# Patient Record
Sex: Female | Born: 1960
Health system: Southern US, Community
[De-identification: ages and names within clinical notes are randomized; demographics above are authoritative.]

## PROBLEM LIST (undated history)

## (undated) DIAGNOSIS — E119 Type 2 diabetes mellitus without complications: Secondary | ICD-10-CM

## (undated) DIAGNOSIS — I213 ST elevation (STEMI) myocardial infarction of unspecified site: Secondary | ICD-10-CM

## (undated) DIAGNOSIS — R112 Nausea with vomiting, unspecified: Secondary | ICD-10-CM

## (undated) DIAGNOSIS — I5189 Other ill-defined heart diseases: Secondary | ICD-10-CM

## (undated) DIAGNOSIS — F419 Anxiety disorder, unspecified: Secondary | ICD-10-CM

## (undated) DIAGNOSIS — I2510 Atherosclerotic heart disease of native coronary artery without angina pectoris: Secondary | ICD-10-CM

## (undated) DIAGNOSIS — Z9889 Other specified postprocedural states: Secondary | ICD-10-CM

## (undated) DIAGNOSIS — F329 Major depressive disorder, single episode, unspecified: Secondary | ICD-10-CM

## (undated) DIAGNOSIS — E039 Hypothyroidism, unspecified: Secondary | ICD-10-CM

## (undated) DIAGNOSIS — E538 Deficiency of other specified B group vitamins: Secondary | ICD-10-CM

## (undated) DIAGNOSIS — Z87442 Personal history of urinary calculi: Secondary | ICD-10-CM

## (undated) DIAGNOSIS — G709 Myoneural disorder, unspecified: Secondary | ICD-10-CM

## (undated) DIAGNOSIS — T8859XA Other complications of anesthesia, initial encounter: Secondary | ICD-10-CM

## (undated) DIAGNOSIS — N200 Calculus of kidney: Secondary | ICD-10-CM

## (undated) DIAGNOSIS — F32A Depression, unspecified: Secondary | ICD-10-CM

## (undated) DIAGNOSIS — I341 Nonrheumatic mitral (valve) prolapse: Secondary | ICD-10-CM

## (undated) DIAGNOSIS — E785 Hyperlipidemia, unspecified: Secondary | ICD-10-CM

## (undated) DIAGNOSIS — Z803 Family history of malignant neoplasm of breast: Secondary | ICD-10-CM

## (undated) HISTORY — DX: Atherosclerotic heart disease of native coronary artery without angina pectoris: I25.10

## (undated) HISTORY — DX: Deficiency of other specified B group vitamins: E53.8

## (undated) HISTORY — DX: Type 2 diabetes mellitus without complications: E11.9

## (undated) HISTORY — PX: CORONARY STENT INTERVENTION: CATH118234

## (undated) HISTORY — DX: Family history of malignant neoplasm of breast: Z80.3

## (undated) HISTORY — DX: Calculus of kidney: N20.0

## (undated) HISTORY — DX: Nonrheumatic mitral (valve) prolapse: I34.1

## (undated) HISTORY — PX: MOUTH SURGERY: SHX715

---

## 1988-11-06 HISTORY — PX: BREAST REDUCTION SURGERY: SHX8

## 1999-08-09 ENCOUNTER — Ambulatory Visit (HOSPITAL_BASED_OUTPATIENT_CLINIC_OR_DEPARTMENT_OTHER): Admission: RE | Admit: 1999-08-09 | Discharge: 1999-08-09 | Payer: Self-pay | Admitting: Orthopedic Surgery

## 1999-09-27 ENCOUNTER — Ambulatory Visit (HOSPITAL_BASED_OUTPATIENT_CLINIC_OR_DEPARTMENT_OTHER): Admission: RE | Admit: 1999-09-27 | Discharge: 1999-09-27 | Payer: Self-pay | Admitting: Orthopedic Surgery

## 2001-11-06 HISTORY — PX: ABDOMINAL HYSTERECTOMY: SHX81

## 2006-06-20 ENCOUNTER — Ambulatory Visit: Payer: Self-pay | Admitting: Urology

## 2008-11-18 ENCOUNTER — Encounter: Payer: Self-pay | Admitting: Family Medicine

## 2009-10-19 LAB — CONVERTED CEMR LAB: Pap Smear: NORMAL

## 2009-11-03 ENCOUNTER — Encounter: Payer: Self-pay | Admitting: Family Medicine

## 2009-11-03 ENCOUNTER — Ambulatory Visit: Payer: Self-pay | Admitting: Family Medicine

## 2009-11-03 DIAGNOSIS — F329 Major depressive disorder, single episode, unspecified: Secondary | ICD-10-CM | POA: Insufficient documentation

## 2009-11-03 DIAGNOSIS — E559 Vitamin D deficiency, unspecified: Secondary | ICD-10-CM | POA: Insufficient documentation

## 2009-11-03 DIAGNOSIS — F411 Generalized anxiety disorder: Secondary | ICD-10-CM | POA: Insufficient documentation

## 2009-11-03 DIAGNOSIS — E785 Hyperlipidemia, unspecified: Secondary | ICD-10-CM | POA: Insufficient documentation

## 2009-11-03 DIAGNOSIS — F32A Depression, unspecified: Secondary | ICD-10-CM | POA: Insufficient documentation

## 2009-11-03 LAB — CONVERTED CEMR LAB: Vit D, 25-Hydroxy: 42 ng/mL (ref 30–89)

## 2009-11-04 LAB — CONVERTED CEMR LAB
ALT: 24 units/L (ref 0–35)
AST: 20 units/L (ref 0–37)
Albumin: 4 g/dL (ref 3.5–5.2)
Alkaline Phosphatase: 55 units/L (ref 39–117)
BUN: 13 mg/dL (ref 6–23)
Bilirubin, Direct: 0 mg/dL (ref 0.0–0.3)
CO2: 30 meq/L (ref 19–32)
Calcium: 9.4 mg/dL (ref 8.4–10.5)
Chloride: 100 meq/L (ref 96–112)
Cholesterol: 205 mg/dL — ABNORMAL HIGH (ref 0–200)
Creatinine, Ser: 0.7 mg/dL (ref 0.4–1.2)
Direct LDL: 136.8 mg/dL
GFR calc non Af Amer: 94.17 mL/min (ref >60)
Glucose, Bld: 87 mg/dL (ref 70–99)
HDL: 56.5 mg/dL (ref >39.00)
Potassium: 4.2 meq/L (ref 3.5–5.1)
Sodium: 137 meq/L (ref 135–145)
Total Bilirubin: 0.9 mg/dL (ref 0.3–1.2)
Total CHOL/HDL Ratio: 4
Total Protein: 7.2 g/dL (ref 6.0–8.3)
Triglycerides: 132 mg/dL (ref 0.0–149.0)
VLDL: 26.4 mg/dL (ref 0.0–40.0)

## 2010-02-14 ENCOUNTER — Telehealth: Payer: Self-pay | Admitting: Family Medicine

## 2010-06-29 ENCOUNTER — Encounter: Payer: Self-pay | Admitting: Family Medicine

## 2010-08-03 ENCOUNTER — Encounter (INDEPENDENT_AMBULATORY_CARE_PROVIDER_SITE_OTHER): Payer: Self-pay | Admitting: *Deleted

## 2010-08-08 ENCOUNTER — Telehealth: Payer: Self-pay | Admitting: Family Medicine

## 2010-08-09 ENCOUNTER — Telehealth: Payer: Self-pay | Admitting: Family Medicine

## 2010-08-09 DIAGNOSIS — L570 Actinic keratosis: Secondary | ICD-10-CM | POA: Insufficient documentation

## 2010-08-09 DIAGNOSIS — L708 Other acne: Secondary | ICD-10-CM | POA: Insufficient documentation

## 2010-08-26 ENCOUNTER — Ambulatory Visit: Payer: Self-pay | Admitting: Family Medicine

## 2010-08-29 ENCOUNTER — Encounter: Payer: Self-pay | Admitting: Family Medicine

## 2010-08-29 LAB — CONVERTED CEMR LAB
Cholesterol: 190 mg/dL (ref 0–200)
HDL: 53 mg/dL (ref >39)
LDL Cholesterol: 84 mg/dL (ref 0–99)
Total CHOL/HDL Ratio: 3.6
Triglycerides: 263 mg/dL — ABNORMAL HIGH (ref <150)
VLDL: 53 mg/dL — ABNORMAL HIGH (ref 0–40)
Vit D, 25-Hydroxy: 72 ng/mL (ref 30–89)

## 2010-12-06 NOTE — Letter (Signed)
Summary: Generic Letter  [place] [facility]  [address] [place], [place] [address]   Phone: [phone]  Fax: [phone]    08/29/2010  [facility] [address] [place], [place]  [address]  Dear Ms. Nesheiwat,    We have received your lab results and Dr. Dayton Martes says that your vitamin d level is excellent!!  Cholesterol is overall great but triglycerides have increased. Weight loss (even small amount) can decrease triglycerides.  Decrease added sugars, eliminate trans fats, increase fiber and limit alcohol.  All these changes together can drop triglycerides by almost 50%.  Please call our office at your convenience to schedule labs in 6 months due to elevated triglycerides.  When calling please inform the scheduler that you need the following labs: (fasting lipid panel, hepatic panel 272.4)       Sincerely,       Linde Gillis CMA (AAMA)for Dr. Ruthe Mannan

## 2010-12-06 NOTE — Assessment & Plan Note (Signed)
Summary: check cholesterol and talk about having lab work done/lfw   Vital Signs:  Patient profile:   50 year old female Height:      63 inches Weight:      184 pounds BMI:     32.71 Temp:     98.8 degrees F oral Pulse rate:   80 / minute Pulse rhythm:   regular BP sitting:   130 / 80  (left arm) Cuff size:   regular  Vitals Entered By: Linde Gillis CMA Duncan Dull) (August 26, 2010 4:02 PM) CC: follow up   History of Present Illness: 50 yo female here for follow up.  Anixety and depression- has had problems since her husband left in 1998.  Much improved now that her yougest daughter has moved back in with her.   Seeing Dr. Evelene Croon, psychiatry.  On Prozaac 20 mg daily. Wellbutrin 150 mg daily, Ambien CR 12.5 mg at bedtime, Xanax 1 mg as needed anxiety since the divorce.  Working well for her.   Vit D deficiency- Vit D was 19.3 in January 2010.  since then has been taking daily Caltrate along with Vit D 2000 Units weekly.    HLD- on Zocor 20 mg daily with any side effects.  FLP last checked in 10/2009- LDL 136, HDL 56, TG 132.    Current Medications (verified): 1)  Ergocalciferol 50000 Unit Caps (Ergocalciferol) .... One Capsule Per Week. 2)  Zocor 20 Mg Tabs (Simvastatin) .... Take 1 Tablet By Mouth Once A Day 3)  Macrobid 100 Mg Caps (Nitrofurantoin Monohyd Macro) .... Take 1 Capsule By Mouth Once A Day 4)  Prozac 20 Mg Caps (Fluoxetine Hcl) .... Take 1 Capsule By Mouth Once A Day 5)  Wellbutrin Xl 150 Mg Xr24h-Tab (Bupropion Hcl) .... Take 1 Tablet By Mouth Once A Day 6)  Ambien Cr 12.5 Mg Cr-Tabs (Zolpidem Tartrate) .... Take 1 Tab By Mouth At Bedtime 7)  Xanax 1 Mg Tabs (Alprazolam) .... As Needed 8)  Claritin-D 24 Hour 10-240 Mg Xr24h-Tab (Loratadine-Pseudoephedrine) .... Take 1 Tablet By Mouth Once A Day 9)  Docusate Sodium 100 Mg Tabs (Docusate Sodium) .... One or Two Daily As Needed. 10)  Vitamin D 1000 Unit  Tabs (Cholecalciferol) .... 2,000 Units Daily 11)  Multivitamins    Tabs (Multiple Vitamin) .... Take 1 Tablet By Mouth Once A Day 12)  Calcium Carbonate-Vitamin D 600-400 Mg-Unit  Tabs (Calcium Carbonate-Vitamin D) .Marland Kitchen.. 1200 Mg. Once Daily 13)  Differin 0.3 % Gel (Adapalene) .... Use As Directed. 14)  Tretinoin 0.025 % Gel (Tretinoin) .... Apply To Area Two Times A Day  Allergies: 1)  ! Sulfa 2)  ! Prednisone  Past History:  Past Medical History: Last updated: 11-28-09 Anxiety Depression  Past Surgical History: Last updated: 2009/11/28 Hysterectomy  Family History: Last updated: 2009-11-28 Dad died of resp failure after back surger at 48. 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  Risk Factors: Exercise: yes (2009-11-28)  Review of Systems      See HPI General:  Denies malaise. Eyes:  Denies blurring. ENT:  Denies difficulty swallowing. CV:  Denies chest pain or discomfort. Resp:  Denies shortness of breath. GI:  Denies abdominal pain and change in bowel habits. MS:  Denies muscle weakness. Derm:  Denies rash. Neuro:  Denies headaches. Psych:  Denies sense of great danger, suicidal thoughts/plans, thoughts of violence, unusual visions or sounds, and thoughts /plans of harming  others.  Physical Exam  General:  Well-developed,well-nourished,in no acute distress; alert,appropriate and cooperative throughout examination Head:  normocephalic, atraumatic, no abnormalities observed, and no abnormalities palpated.   Eyes:  vision grossly intact, pupils equal, pupils round, and pupils reactive to light.   Ears:  R ear normal and L ear normal.   Nose:  no external deformity.   Mouth:  good dentition.   Lungs:  Normal respiratory effort, chest expands symmetrically. Lungs are clear to auscultation, no crackles or wheezes. Heart:  Normal rate and regular rhythm. S1 and S2 normal without gallop, murmur, click, rub or other extra  sounds. Abdomen:  Bowel sounds positive,abdomen soft and non-tender without masses, organomegaly or hernias noted. Extremities:  No clubbing, cyanosis, edema, or deformity noted with normal full range of motion of all joints.   Psych:  pleasant, talkative, good eye contact.   Impression & Recommendations:  Problem # 1:  DEPRESSION (ICD-311) Assessment Improved Spirits are much better now that her daughter is living with her again. Continue current meds, psychotherapy as needed. Her updated medication list for this problem includes:    Prozac 20 Mg Caps (Fluoxetine hcl) .Marland Kitchen... Take 1 capsule by mouth once a day    Wellbutrin Xl 150 Mg Xr24h-tab (Bupropion hcl) .Marland Kitchen... Take 1 tablet by mouth once a day    Xanax 1 Mg Tabs (Alprazolam) .Marland Kitchen... As needed  Problem # 2:  VITAMIN D DEFICIENCY (ICD-268.9) Assessment: Unchanged recheck Vit D today. Orders: T-Vitamin D (25-Hydroxy) [phone])  Problem # 3:  HYPERLIPIDEMIA (ICD-272.4) Assessment: Unchanged Recheck lipid panel today. Her updated medication list for this problem includes:    Zocor 20 Mg Tabs (Simvastatin) .Marland Kitchen... Take 1 tablet by mouth once a day  Orders: Venipuncture (09811) T-Lipid Profile ([id number])  Complete Medication List: 1)  Ergocalciferol 50000 Unit Caps (Ergocalciferol) .... One capsule per week. 2)  Zocor 20 Mg Tabs (Simvastatin) .... Take 1 tablet by mouth once a day 3)  Macrobid 100 Mg Caps (Nitrofurantoin monohyd macro) .... Take 1 capsule by mouth once a day 4)  Prozac 20 Mg Caps (Fluoxetine hcl) .... Take 1 capsule by mouth once a day 5)  Wellbutrin Xl 150 Mg Xr24h-tab (Bupropion hcl) .... Take 1 tablet by mouth once a day 6)  Ambien Cr 12.5 Mg Cr-tabs (Zolpidem tartrate) .... Take 1 tab by mouth at bedtime 7)  Xanax 1 Mg Tabs (Alprazolam) .... As needed 8)  Claritin-d 24 Hour 10-240 Mg Xr24h-tab (Loratadine-pseudoephedrine) .... Take 1 tablet by mouth once a day 9)  Docusate Sodium 100 Mg Tabs (Docusate  sodium) .... One or two daily as needed. 10)  Vitamin D 1000 Unit Tabs (Cholecalciferol) .... 2,000 units daily 11)  Multivitamins Tabs (Multiple vitamin) .... Take 1 tablet by mouth once a day 12)  Calcium Carbonate-vitamin D 600-400 Mg-unit Tabs (Calcium carbonate-vitamin d) .Marland Kitchen.. 1200 mg. once daily 13)  Differin 0.3 % Gel (Adapalene) .... Use as directed. 14)  Tretinoin 0.025 % Gel (Tretinoin) .... Apply to area two times a day   Orders Added: 1)  Venipuncture [36415] 2)  T-Vitamin D (25-Hydroxy) [[id number]] 3)  T-Lipid Profile [[id number]] 4)  Est. Patient Level IV [96295]    Current Allergies (reviewed today): ! SULFA ! PREDNISONE

## 2010-12-06 NOTE — Progress Notes (Signed)
Summary: prior Berkley Harvey is needed for differin  Phone Note From Pharmacy   Caller: [facility] [address]/ [employer] Summary of Call: Prior Berkley Harvey is needed for differin gel, the form that [facility] sent is for tretinoin, form is on your desk. Initial call taken by: Lowella Petties CMA,  August 09, 2010 8:33 AM  Follow-up for Phone Call        Thank you, form filled out. Ruthe Mannan MD  August 09, 2010 8:54 AM   Additional Follow-up for Phone Call Additional follow up Details #1::        Form faxed. 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

## 2010-12-06 NOTE — Miscellaneous (Signed)
Summary: flu vaccine at [facility]  Clinical Lists Changes  Observations: Added new observation of FLU VAX: Historical (08/02/2010 9:11)      Immunization History:  Influenza Immunization History:    Influenza:  historical (08/02/2010) Pt received flu vaccine at [facility], [address], [place].       Lowella Petties CMA  August 03, 2010 9:11 AM

## 2010-12-06 NOTE — Progress Notes (Signed)
Summary: regarding prior auth  Phone Note From Pharmacy   Caller: [facility] [address] / Garment/textile technologist of Call: Received approval letter for tretinoin from [place], but differin is still not going through.  Do you want me to call insurance company again, or do you want to change the script?  Approval letter is on your desk. Initial call taken by: Lowella Petties CMA,  August 09, 2010 12:29 PM  Follow-up for Phone Call        dose of tretinoin is slightly different than differns.  Can you find out what dose she is taking? Ruthe Mannan MD  August 10, 2010 7:47 AM   Additional Follow-up for Phone Call Additional follow up Details #1::        She is using .3 % gel, applies to affected areas 1-2 times a day.            Lowella Petties CMA  August 10, 2010 10:17 AM     New/Updated Medications: TRETINOIN 0.025 % GEL (TRETINOIN) apply to area two times a day Prescriptions: TRETINOIN 0.025 % GEL (TRETINOIN) apply to area two times a day  #30 g x 0   Entered and Authorized by:   Ruthe Mannan MD   Signed by:   Ruthe Mannan MD on 08/10/2010   Method used:   Electronically to        [facility]. [address] [phone]* (retail)       [address] [place], [place]  [id number]       Ph: [phone]       Fax: [phone]   RxID:   [id number]

## 2010-12-06 NOTE — Progress Notes (Signed)
Summary: differen gel  Phone Note Call from Patient Call back at [phone]   Caller: Patient Call For: Ruthe Mannan MD Summary of Call: Patient was prescribed differin gel 3% by her previous dr. for acne treatment and now she needs a refill. She wants to know if she needs to see you before getting the refill or if she can just have it sent to [facility] [address].  Initial call taken by: Melody Comas,  February 14, 2010 12:01 PM     Appended Document: differen gel Patient Advised.
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
## 2010-12-06 NOTE — Progress Notes (Signed)
Summary: differin gel   Phone Note Refill Request Message from:  Fax from Pharmacy on August 08, 2010 9:57 AM  Refills Requested: Medication #1:  DIFFERIN 0.3 % GEL use as directed..   Last Refilled: 1960/03[birth year]26 Refill request from [facility] [address] [phone]  Initial call taken by: Melody Comas,  August 08, 2010 9:58 AM    Prescriptions: DIFFERIN 0.3 % GEL (ADAPALENE) use as directed.  #1 x 0   Entered and Authorized by:   Ruthe Mannan MD   Signed by:   Ruthe Mannan MD on 08/08/2010   Method used:   Electronically to        [facility]. [address] [phone]* (retail)       [address] [place], [place]  [id number]       Ph: [phone]       Fax: [phone]   RxID:   [id number]

## 2010-12-06 NOTE — Letter (Signed)
Summary: [facility]   Imported By: Lanelle Bal 07/06/2010 10:21:48  _____________________________________________________________________  External Attachment:    Type:   Image     Comment:   External Document

## 2010-12-19 ENCOUNTER — Encounter: Payer: Self-pay | Admitting: Family Medicine

## 2010-12-19 ENCOUNTER — Ambulatory Visit (INDEPENDENT_AMBULATORY_CARE_PROVIDER_SITE_OTHER): Payer: BC Managed Care – PPO | Admitting: Family Medicine

## 2010-12-19 DIAGNOSIS — J018 Other acute sinusitis: Secondary | ICD-10-CM | POA: Insufficient documentation

## 2010-12-26 ENCOUNTER — Telehealth: Payer: Self-pay | Admitting: Family Medicine

## 2010-12-28 ENCOUNTER — Telehealth: Payer: Self-pay | Admitting: Family Medicine

## 2010-12-28 NOTE — Assessment & Plan Note (Signed)
Summary: ? SINUS INFECTION   Vital Signs:  Patient profile:   50 year old female Height:      63 inches Weight:      184.25 pounds BMI:     32.76 Temp:     98.9 degrees F oral Pulse rate:   84 / minute Pulse rhythm:   regular BP sitting:   132 / 80  (left arm) Cuff size:   regular  Vitals Entered By: Linde Gillis CMA Duncan Dull) (December 19, 2010 3:07 PM) CC: sinus infection   History of Present Illness: 50 yo here for ? sinus infection.  over 1 week of worsening URI symptoms. started with runny nose, dry cough. now dizzy, ears popping, has chills and body aches. taking mucinex DM and Ibuprofen. no CP or SOB.  Current Medications (verified): 1)  Ergocalciferol 50000 Unit Caps (Ergocalciferol) .... One Capsule Per Week. 2)  Zocor 20 Mg Tabs (Simvastatin) .... Take 1 Tablet By Mouth Once A Day 3)  Macrobid 100 Mg Caps (Nitrofurantoin Monohyd Macro) .... Take 1 Capsule By Mouth Once A Day 4)  Prozac 20 Mg Caps (Fluoxetine Hcl) .... Take 1 Capsule By Mouth Once A Day 5)  Wellbutrin Xl 150 Mg Xr24h-Tab (Bupropion Hcl) .... Take 1 Tablet By Mouth Once A Day 6)  Ambien Cr 12.5 Mg Cr-Tabs (Zolpidem Tartrate) .... Take 1 Tab By Mouth At Bedtime 7)  Xanax 1 Mg Tabs (Alprazolam) .... As Needed 8)  Claritin-D 24 Hour 10-240 Mg Xr24h-Tab (Loratadine-Pseudoephedrine) .... Take 1 Tablet By Mouth Once A Day 9)  Docusate Sodium 100 Mg Tabs (Docusate Sodium) .... One or Two Daily As Needed. 10)  Vitamin D 1000 Unit  Tabs (Cholecalciferol) .... 2,000 Units Daily 11)  Multivitamins   Tabs (Multiple Vitamin) .... Take 1 Tablet By Mouth Once A Day 12)  Calcium Carbonate-Vitamin D 600-400 Mg-Unit  Tabs (Calcium Carbonate-Vitamin D) .Marland Kitchen.. 1200 Mg. Once Daily 13)  Differin 0.3 % Gel (Adapalene) .... Use As Directed. 14)  Tretinoin 0.025 % Gel (Tretinoin) .... Apply To Area Two Times A Day 15)  Amoxicillin 875 Mg Tabs (Amoxicillin) .Marland Kitchen.. 1 Tab By Mouth Two Times A Day X 10 Days  Allergies: 1)  !  Sulfa 2)  ! Prednisone  Past History:  Past Medical History: Last updated: 2009/11/22 Anxiety Depression  Past Surgical History: Last updated: 22-Nov-2009 Hysterectomy  Family History: Last updated: 11-22-2009 Dad died of resp failure after back surger at 78. 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  Risk Factors: Exercise: yes (2009/11/22)  Review of Systems      See HPI General:  Complains of chills. ENT:  Complains of hoarseness, nasal congestion, sinus pressure, and sore throat. Resp:  Complains of cough.  Physical Exam  General:  Well-developed,well-nourished,in no acute distress; alert,appropriate and cooperative throughout examination, congested VSS and non toxic appearing Ears:  thick fluid behind TMs bilaterally, right> left Nose:  boggy turbinates, sinuses TTP throughout Mouth:  +PND Lungs:  Normal respiratory effort, chest expands symmetrically. Lungs are clear to auscultation, no crackles or wheezes. Heart:  Normal rate and regular rhythm. S1 and S2 normal without gallop, murmur, click, rub or other extra sounds. Extremities:  No clubbing, cyanosis, edema, or deformity noted with normal full range of motion of all joints.   Neurologic:  alert & oriented X3 and gait normal.   Skin:  Intact without suspicious lesions or rashes Psych:  pleasant, talkative,  good eye contact.   Impression & Recommendations:  Problem # 1:  OTHER ACUTE SINUSITIS (ICD-461.8) Assessment New Given duration and progression of symptoms, will treat for bacterial sinusitis with amoxicillin. Continue supportive care as per pt instructions. Her updated medication list for this problem includes:    Claritin-d 24 Hour 10-240 Mg Xr24h-tab (Loratadine-pseudoephedrine) .Marland Kitchen... Take 1 tablet by mouth once a day    Amoxicillin 875 Mg Tabs (Amoxicillin) .Marland Kitchen... 1 tab by mouth two times a day  x 10 days  Complete Medication List: 1)  Ergocalciferol 50000 Unit Caps (Ergocalciferol) .... One capsule per week. 2)  Zocor 20 Mg Tabs (Simvastatin) .... Take 1 tablet by mouth once a day 3)  Macrobid 100 Mg Caps (Nitrofurantoin monohyd macro) .... Take 1 capsule by mouth once a day 4)  Prozac 20 Mg Caps (Fluoxetine hcl) .... Take 1 capsule by mouth once a day 5)  Wellbutrin Xl 150 Mg Xr24h-tab (Bupropion hcl) .... Take 1 tablet by mouth once a day 6)  Ambien Cr 12.5 Mg Cr-tabs (Zolpidem tartrate) .... Take 1 tab by mouth at bedtime 7)  Xanax 1 Mg Tabs (Alprazolam) .... As needed 8)  Claritin-d 24 Hour 10-240 Mg Xr24h-tab (Loratadine-pseudoephedrine) .... Take 1 tablet by mouth once a day 9)  Docusate Sodium 100 Mg Tabs (Docusate sodium) .... One or two daily as needed. 10)  Vitamin D 1000 Unit Tabs (Cholecalciferol) .... 2,000 units daily 11)  Multivitamins Tabs (Multiple vitamin) .... Take 1 tablet by mouth once a day 12)  Calcium Carbonate-vitamin D 600-400 Mg-unit Tabs (Calcium carbonate-vitamin d) .Marland Kitchen.. 1200 mg. once daily 13)  Differin 0.3 % Gel (Adapalene) .... Use as directed. 14)  Tretinoin 0.025 % Gel (Tretinoin) .... Apply to area two times a day 15)  Amoxicillin 875 Mg Tabs (Amoxicillin) .Marland Kitchen.. 1 tab by mouth two times a day x 10 days  Patient Instructions: 1)  Take antibiotic as directed.  Drink lots of fluids.  Treat sympotmatically with Mucinex, nasal saline irrigation, and Tylenol/Ibuprofen.  Call if not improving as expected in 5-7 days.  Prescriptions: AMOXICILLIN 875 MG TABS (AMOXICILLIN) 1 tab by mouth two times a day x 10 days  #20 x 0   Entered and Authorized by:   Ruthe Mannan MD   Signed by:   Ruthe Mannan MD on 12/19/2010   Method used:   Electronically to        [facility]. [address] [phone]* (retail)       [address] [place], [place]  [address]       Ph: [phone]       Fax: [phone]   RxID:   [id number]    Orders Added: 1)  Est.  Patient Level III [02725]    Current Allergies (reviewed today): ! SULFA ! PREDNISONE

## 2011-01-03 NOTE — Progress Notes (Signed)
Summary: still not better  Phone Note Call from Patient Call back at Home Phone [phone]   Caller: Patient Call For: Cassandra Mannan MD Summary of Call: Pt states she finished amox today and is still running low grade fevers- 99 to 100.  She states she feels worse today then she did yesterday.  She is still taking mucinex. Please advise on what she should do.  [facility] [address]   Lowella Petties CMA, AAMA  December 28, 2010 3:51 PM   Follow-up for Phone Call        Zpack sent to her pharmacy.  Continue supportive care with mucinex and decongestants.   Cassandra Mannan MD  December 29, 2010 7:13 AM Flonase sent in error (has allergy to prednisone).  I called pharmacy to cancel. Cassandra Mannan MD  December 29, 2010 7:15 AM.  Additional Follow-up for Phone Call Additional follow up Details #1::        Advised pt.           Lowella Petties CMA, AAMA  December 29, 2010 8:53 AM     New/Updated Medications: AZITHROMYCIN 250 MG  TABS (AZITHROMYCIN) 2 by  mouth today and then 1 daily for 4 days FLUTICASONE PROPIONATE 50 MCG/ACT  SUSP (FLUTICASONE PROPIONATE) 2 sprays each nostril once daily Prescriptions: FLUTICASONE PROPIONATE 50 MCG/ACT  SUSP (FLUTICASONE PROPIONATE) 2 sprays each nostril once daily  #1 vial x 3   Entered and Authorized by:   Cassandra Mannan MD   Signed by:   Cassandra Mannan MD on 12/29/2010   Method used:   Electronically to        [facility]. [address] [phone]* (retail)       [address] [place], [place]  [address]       Ph: [phone]       Fax: [phone]   RxID:   [id number] AZITHROMYCIN 250 MG  TABS (AZITHROMYCIN) 2 by  mouth today and then 1 daily for 4 days  #6 x 0   Entered and Authorized by:   Cassandra Mannan MD   Signed by:   Cassandra Mannan MD on 12/29/2010   Method used:   Electronically to        [facility]. [address] [phone]* (retail)       [address] [place], [place]  [address]       Ph: [phone]       Fax: [phone]   RxID:    [id number]

## 2011-01-03 NOTE — Progress Notes (Signed)
Summary: Not feeling much better  Phone Note Call from Patient Call back at Home Phone [phone]   Caller: Patient Call For: Ruthe Mannan MD Summary of Call: Patient called to let Dr. Dayton Martes know that she still is running a low grade fever.  She is still congested in her head and chest.  She is still on Amoxicillin but not feeling much better.  Please advise.  Uses [facility].   Initial call taken by: Linde Gillis CMA Duncan Dull),  December 26, 2010 4:21 PM  Follow-up for Phone Call        Is she using any OTC decongestants?  Yes, she is using Mucinex.  Linde Gillis CMA Duncan Dull)  December 26, 2010 4:25 PM   Please advise her to continue taking Mucinex.  Finish course of her amoxicillin. If still feeling this way after she finishes full course, will try another abx. Ruthe Mannan MD  December 26, 2010 4:27 PM  Henderson Newcomer as instructed via telephone.  Follow-up by: Linde Gillis CMA Duncan Dull),  December 26, 2010 4:30 PM

## 2011-06-30 ENCOUNTER — Other Ambulatory Visit: Payer: Self-pay | Admitting: *Deleted

## 2011-06-30 MED ORDER — SIMVASTATIN 20 MG PO TABS: 20.0000 mg | ORAL_TABLET | Freq: Every day | ORAL | Status: DC

## 2011-10-25 ENCOUNTER — Telehealth: Payer: Self-pay | Admitting: Internal Medicine

## 2011-10-25 NOTE — Telephone Encounter (Signed)
Patient would like to know if she should get lab work done because she hasn't had lab work since 2011.

## 2011-10-25 NOTE — Telephone Encounter (Signed)
Yes please have her schedule cpx and we can do fasting labs prior

## 2011-10-26 NOTE — Telephone Encounter (Signed)
Spoke with patient and scheduled CPX and lab appt.

## 2011-11-09 ENCOUNTER — Other Ambulatory Visit: Payer: BC Managed Care – PPO

## 2011-11-09 ENCOUNTER — Other Ambulatory Visit (INDEPENDENT_AMBULATORY_CARE_PROVIDER_SITE_OTHER): Payer: BC Managed Care – PPO

## 2011-11-09 ENCOUNTER — Other Ambulatory Visit: Payer: Self-pay | Admitting: Family Medicine

## 2011-11-09 DIAGNOSIS — E559 Vitamin D deficiency, unspecified: Secondary | ICD-10-CM

## 2011-11-09 DIAGNOSIS — Z0000 Encounter for general adult medical examination without abnormal findings: Secondary | ICD-10-CM

## 2011-11-09 DIAGNOSIS — E785 Hyperlipidemia, unspecified: Secondary | ICD-10-CM

## 2011-11-09 LAB — BASIC METABOLIC PANEL
BUN: 16 mg/dL (ref 6–23)
CO2: 26 mEq/L (ref 19–32)
Calcium: 9.1 mg/dL (ref 8.4–10.5)
Chloride: 102 mEq/L (ref 96–112)
Creatinine, Ser: 0.7 mg/dL (ref 0.4–1.2)
GFR: 98.26 mL/min (ref >60.00)
Glucose, Bld: 88 mg/dL (ref 70–99)
Potassium: 3.8 mEq/L (ref 3.5–5.1)
Sodium: 138 mEq/L (ref 135–145)

## 2011-11-09 LAB — LIPID PANEL
Cholesterol: 209 mg/dL — ABNORMAL HIGH (ref 0–200)
HDL: 63.3 mg/dL (ref >39.00)
Total CHOL/HDL Ratio: 3
Triglycerides: 140 mg/dL (ref 0.0–149.0)
VLDL: 28 mg/dL (ref 0.0–40.0)

## 2011-11-09 LAB — HEPATIC FUNCTION PANEL
ALT: 24 U/L (ref 0–35)
AST: 20 U/L (ref 0–37)
Albumin: 3.9 g/dL (ref 3.5–5.2)
Alkaline Phosphatase: 63 U/L (ref 39–117)
Bilirubin, Direct: 0 mg/dL (ref 0.0–0.3)
Total Bilirubin: 0.6 mg/dL (ref 0.3–1.2)
Total Protein: 7.3 g/dL (ref 6.0–8.3)

## 2011-11-09 LAB — LDL CHOLESTEROL, DIRECT: Direct LDL: 138.5 mg/dL

## 2011-11-10 LAB — VITAMIN D 25 HYDROXY (VIT D DEFICIENCY, FRACTURES): Vit D, 25-Hydroxy: 65 ng/mL (ref 30–89)

## 2011-11-13 ENCOUNTER — Encounter: Payer: Self-pay | Admitting: Family Medicine

## 2011-11-13 ENCOUNTER — Ambulatory Visit (INDEPENDENT_AMBULATORY_CARE_PROVIDER_SITE_OTHER): Payer: BC Managed Care – PPO | Admitting: Family Medicine

## 2011-11-13 VITALS — BP 140/82 | HR 90 | Temp 98.8°F | Ht 62.75 in | Wt 184.5 lb

## 2011-11-13 DIAGNOSIS — F411 Generalized anxiety disorder: Secondary | ICD-10-CM

## 2011-11-13 DIAGNOSIS — E785 Hyperlipidemia, unspecified: Secondary | ICD-10-CM

## 2011-11-13 DIAGNOSIS — E559 Vitamin D deficiency, unspecified: Secondary | ICD-10-CM

## 2011-11-13 DIAGNOSIS — Z0000 Encounter for general adult medical examination without abnormal findings: Secondary | ICD-10-CM | POA: Insufficient documentation

## 2011-11-13 DIAGNOSIS — F3289 Other specified depressive episodes: Secondary | ICD-10-CM

## 2011-11-13 DIAGNOSIS — F329 Major depressive disorder, single episode, unspecified: Secondary | ICD-10-CM

## 2011-11-13 MED ORDER — FLUOCINONIDE-E 0.05 % EX CREA: TOPICAL_CREAM | Freq: Two times a day (BID) | CUTANEOUS | Status: AC

## 2011-11-13 NOTE — Progress Notes (Signed)
51 yo female here for CPX.  Will see GYN this month. Has had two mammograms this year.  Anixety and depression- has had problems since her husband left in 1998. Younger daughter, Trula Ore, just moved in again with her ex husband. Trula Ore is having problems in school which really worries Airyanna. Seeing Dr. Evelene Croon, psychiatry.  On Prozaac 20 mg daily. Wellbutrin 150 mg daily, Ambien CR 12.5 mg at bedtime, Xanax 1 mg as needed anxiety since the divorce.  Working well for her.   Rash- itchy rash under her right arm for several months.  Not painful.  No changes that she is aware of in soaps, may be new deodorant.  Vit D deficiency- Vit D was 19.3 in January 2010.  since then has been taking daily Caltrate along with Vit D 2000 Units weekly.   Last week, Vit D 65.  HLD-  Lab Results  Component Value Date   CHOL 209* 11/09/2011   HDL 63.30 11/09/2011   LDLCALC 84 08/26/2010   LDLDIRECT 138.5 11/09/2011   TRIG 140.0 11/09/2011   CHOLHDL 3 11/09/2011   Lab Results  Component Value Date   ALT 24 11/09/2011   AST 20 11/09/2011   ALKPHOS 63 11/09/2011   BILITOT 0.6 11/09/2011    Well woman- has OBGYN (Dr. Luella Cook).  Last pap in 12/10- normal.  No h/o abnormal pap smears.  Partical hysterectomy s/p fibroids.  Patient Active Problem List  Diagnoses  . VITAMIN D DEFICIENCY  . HYPERLIPIDEMIA  . ANXIETY  . DEPRESSION  . ACTINIC KERATOSIS  . ACNE VULGARIS  . OTHER ACUTE SINUSITIS  . Routine general medical examination at a health care facility   No past medical history on file. No past surgical history on file. History  Substance Use Topics  . Smoking status: Not on file  . Smokeless tobacco: Not on file  . Alcohol Use: Not on file   No family history on file. Allergies  Allergen Reactions  . Prednisone     REACTION: Swelling  . Sulfonamide Derivatives     REACTION: Hives   Current Outpatient Prescriptions on File Prior to Visit  Medication Sig Dispense Refill  . simvastatin (ZOCOR) 20  MG tablet Take 1 tablet (20 mg total) by mouth at bedtime.  90 tablet  3   The PMH, PSH, Social History, Family History, Medications, and allergies have been reviewed in [facility], and have been updated if relevant.   ROS: See HPI Patient reports no  vision/ hearing changes,anorexia, weight change, fever ,adenopathy, persistant / recurrent hoarseness, swallowing issues, chest pain, edema,persistant / recurrent cough, hemoptysis, dyspnea(rest, exertional, paroxysmal nocturnal), gastrointestinal  bleeding (melena, rectal bleeding), abdominal pain, excessive heart burn, GU symptoms(dysuria, hematuria, pyuria, voiding/incontinence  Issues) syncope, focal weakness, severe memory loss, concerning skin lesions, depression, anxiety, abnormal bruising/bleeding, major joint swelling, breast masses or abnormal vaginal bleeding.    Physical exam: BP 140/82  Pulse 90  Temp(Src) 98.8 F (37.1 C) (Oral)  Ht 5' 2.75" (1.594 m)  Wt 184 lb 8 oz (83.689 kg)  BMI 32.94 kg/m2  General:  Well-developed,well-nourished,in no acute distress; alert,appropriate and cooperative throughout examination Head:  normocephalic and atraumatic.   Eyes:  vision grossly intact, pupils equal, pupils round, and pupils reactive to light.   Ears:  R ear normal and L ear normal.   Nose:  no external deformity.   Mouth:  good dentition.   Neck:  No deformities, masses, or tenderness noted. Lungs:  Normal respiratory effort, chest expands symmetrically.  Lungs are clear to auscultation, no crackles or wheezes. Heart:  Normal rate and regular rhythm. S1 and S2 normal without gallop, murmur, click, rub or other extra sounds. Abdomen:  Bowel sounds positive,abdomen soft and non-tender without masses, organomegaly or hernias noted. Msk:  No deformity or scoliosis noted of thoracic or lumbar spine.   Extremities:  No clubbing, cyanosis, edema, or deformity noted with normal full range of motion of all joints.   Neurologic:  alert & oriented  X3 and gait normal.   Skin:  Raised, erythematous linear rash under right axilla Psych:  Cognition and judgment appear intact. Alert and cooperative with normal attention span and concentration. No apparent delusions, illusions, hallucinations  Assessment and Plan: 1. DEPRESSION  Stable- coping well with new stressors.  2. Rash- New- consistent with allergic dermatitis. Allergy to oral steroids, not topical. Advised switching back to prior deodorant and use topical lidex.  3. VITAMIN D DEFICIENCY  Stable.   4. Routine general medical examination at a health care facility  Reviewed preventive care protocols, scheduled due services, and updated immunizations Discussed nutrition, exercise, diet, and healthy lifestyle.

## 2012-01-05 ENCOUNTER — Other Ambulatory Visit: Payer: Self-pay | Admitting: Family Medicine

## 2012-01-05 MED ORDER — NYSTATIN 100000 UNIT/GM EX OINT: TOPICAL_OINTMENT | Freq: Two times a day (BID) | CUTANEOUS | Status: AC

## 2012-01-05 MED ORDER — DAPSONE 5 % EX GEL: CUTANEOUS | Status: DC

## 2012-07-02 ENCOUNTER — Other Ambulatory Visit: Payer: Self-pay | Admitting: Family Medicine

## 2012-10-31 ENCOUNTER — Other Ambulatory Visit: Payer: Self-pay | Admitting: Family Medicine

## 2013-06-19 ENCOUNTER — Other Ambulatory Visit: Payer: Self-pay | Admitting: Family Medicine

## 2013-06-19 ENCOUNTER — Other Ambulatory Visit (INDEPENDENT_AMBULATORY_CARE_PROVIDER_SITE_OTHER): Payer: BC Managed Care – PPO

## 2013-06-19 DIAGNOSIS — E785 Hyperlipidemia, unspecified: Secondary | ICD-10-CM

## 2013-06-19 DIAGNOSIS — Z0000 Encounter for general adult medical examination without abnormal findings: Secondary | ICD-10-CM

## 2013-06-19 DIAGNOSIS — E559 Vitamin D deficiency, unspecified: Secondary | ICD-10-CM

## 2013-06-19 DIAGNOSIS — L570 Actinic keratosis: Secondary | ICD-10-CM

## 2013-06-19 LAB — COMPREHENSIVE METABOLIC PANEL
ALT: 22 U/L (ref 0–35)
AST: 18 U/L (ref 0–37)
Albumin: 3.9 g/dL (ref 3.5–5.2)
Alkaline Phosphatase: 61 U/L (ref 39–117)
BUN: 17 mg/dL (ref 6–23)
CO2: 28 mEq/L (ref 19–32)
Calcium: 9.1 mg/dL (ref 8.4–10.5)
Chloride: 102 mEq/L (ref 96–112)
Creatinine, Ser: 0.7 mg/dL (ref 0.4–1.2)
GFR: 92.84 mL/min (ref >60.00)
Glucose, Bld: 91 mg/dL (ref 70–99)
Potassium: 4.1 mEq/L (ref 3.5–5.1)
Sodium: 135 mEq/L (ref 135–145)
Total Bilirubin: 0.7 mg/dL (ref 0.3–1.2)
Total Protein: 7.4 g/dL (ref 6.0–8.3)

## 2013-06-19 LAB — LIPID PANEL
Cholesterol: 262 mg/dL — ABNORMAL HIGH (ref 0–200)
HDL: 49.1 mg/dL (ref >39.00)
Total CHOL/HDL Ratio: 5
Triglycerides: 192 mg/dL — ABNORMAL HIGH (ref 0.0–149.0)
VLDL: 38.4 mg/dL (ref 0.0–40.0)

## 2013-06-19 LAB — LDL CHOLESTEROL, DIRECT: Direct LDL: 189.6 mg/dL

## 2013-06-20 LAB — VITAMIN D 25 HYDROXY (VIT D DEFICIENCY, FRACTURES): Vit D, 25-Hydroxy: 81 ng/mL (ref 30–89)

## 2013-06-30 ENCOUNTER — Ambulatory Visit (INDEPENDENT_AMBULATORY_CARE_PROVIDER_SITE_OTHER): Payer: BC Managed Care – PPO | Admitting: Family Medicine

## 2013-06-30 ENCOUNTER — Encounter: Payer: Self-pay | Admitting: Family Medicine

## 2013-06-30 VITALS — BP 124/80 | HR 84 | Temp 98.2°F | Wt 184.0 lb

## 2013-06-30 DIAGNOSIS — E785 Hyperlipidemia, unspecified: Secondary | ICD-10-CM

## 2013-06-30 MED ORDER — SIMVASTATIN 20 MG PO TABS: 20.0000 mg | ORAL_TABLET | Freq: Every day | ORAL | Status: DC

## 2013-06-30 NOTE — Patient Instructions (Addendum)
Good to see you. Please restart your cholesterol medication.  Let's plan on rechecking your cholesterol in 2-3 months.

## 2013-06-30 NOTE — Progress Notes (Signed)
52 yo female who has not been seen since 11/2011 for routine care, here for follow up labs.     HLD-  Deteriorated.  Was previously taking Zocor 20 mg daily but stopped taking it.  No adverse effects.   Lab Results  Component Value Date   CHOL 262* 06/19/2013   HDL 49.10 06/19/2013   LDLCALC 84 08/26/2010   LDLDIRECT 189.6 06/19/2013   TRIG 192.0* 06/19/2013   CHOLHDL 5 06/19/2013     Lab Results  Component Value Date   ALT 22 06/19/2013   AST 18 06/19/2013   ALKPHOS 61 06/19/2013   BILITOT 0.7 06/19/2013     Patient Active Problem List  Diagnoses  . VITAMIN D DEFICIENCY  . HYPERLIPIDEMIA  . ANXIETY  . DEPRESSION  . ACTINIC KERATOSIS  . ACNE VULGARIS  . OTHER ACUTE SINUSITIS  . Routine general medical examination at a health care facility   No past medical history on file. No past surgical history on file. History  Substance Use Topics  . Smoking status: Not on file  . Smokeless tobacco: Not on file  . Alcohol Use: Not on file   No family history on file. Allergies  Allergen Reactions  . Prednisone     REACTION: Swelling  . Sulfonamide Derivatives     REACTION: Hives   Current Outpatient Prescriptions on File Prior to Visit  Medication Sig Dispense Refill  . simvastatin (ZOCOR) 20 MG tablet Take 1 tablet (20 mg total) by mouth at bedtime.  90 tablet  3   The PMH, PSH, Social History, Family History, Medications, and allergies have been reviewed in [facility], and have been updated if relevant.   ROS: See HPI  Physical exam: BP 124/80  Pulse 84  Temp(Src) 98.2 F (36.8 C)  Wt 184 lb (83.462 kg)  BMI 32.85 kg/m2  General:  Well-developed,well-nourished,in no acute distress; alert,appropriate and cooperative throughout examination Head:  normocephalic and atraumatic.   Psych:  Cognition and judgment appear intact. Alert and cooperative with normal attention span and concentration. No apparent delusions, illusions, hallucinations  Assessment and Plan: 1.  HYPERLIPIDEMIA >15 min spent with face to face with patient, >50% counseling and/or coordinating care. She agrees to restart Zocor.  Will recheck labs in 8 weeks. The patient indicates understanding of these issues and agrees with the plan.

## 2013-12-04 ENCOUNTER — Other Ambulatory Visit (INDEPENDENT_AMBULATORY_CARE_PROVIDER_SITE_OTHER): Payer: BC Managed Care – PPO

## 2013-12-04 ENCOUNTER — Other Ambulatory Visit: Payer: Self-pay | Admitting: Family Medicine

## 2013-12-04 DIAGNOSIS — Z136 Encounter for screening for cardiovascular disorders: Secondary | ICD-10-CM

## 2013-12-04 DIAGNOSIS — Z0000 Encounter for general adult medical examination without abnormal findings: Secondary | ICD-10-CM

## 2013-12-04 LAB — COMPREHENSIVE METABOLIC PANEL
ALT: 22 U/L (ref 0–35)
AST: 16 U/L (ref 0–37)
Albumin: 3.8 g/dL (ref 3.5–5.2)
Alkaline Phosphatase: 69 U/L (ref 39–117)
BUN: 17 mg/dL (ref 6–23)
CO2: 28 mEq/L (ref 19–32)
Calcium: 9.1 mg/dL (ref 8.4–10.5)
Chloride: 103 mEq/L (ref 96–112)
Creatinine, Ser: 0.6 mg/dL (ref 0.4–1.2)
GFR: 115.14 mL/min (ref >60.00)
Glucose, Bld: 81 mg/dL (ref 70–99)
Potassium: 4.2 mEq/L (ref 3.5–5.1)
Sodium: 137 mEq/L (ref 135–145)
Total Bilirubin: 0.6 mg/dL (ref 0.3–1.2)
Total Protein: 7.1 g/dL (ref 6.0–8.3)

## 2013-12-04 LAB — LDL CHOLESTEROL, DIRECT: Direct LDL: 142.2 mg/dL

## 2013-12-04 LAB — CBC WITH DIFFERENTIAL/PLATELET
Basophils Absolute: 0 10*3/uL (ref 0.0–0.1)
Basophils Relative: 0.5 % (ref 0.0–3.0)
Eosinophils Absolute: 0.1 10*3/uL (ref 0.0–0.7)
Eosinophils Relative: 1 % (ref 0.0–5.0)
HCT: 38.3 % (ref 36.0–46.0)
Hemoglobin: 12.4 g/dL (ref 12.0–15.0)
Lymphocytes Relative: 33.6 % (ref 12.0–46.0)
Lymphs Abs: 2.3 10*3/uL (ref 0.7–4.0)
MCHC: 32.4 g/dL (ref 30.0–36.0)
MCV: 95.5 fl (ref 78.0–100.0)
Monocytes Absolute: 0.5 10*3/uL (ref 0.1–1.0)
Monocytes Relative: 7.2 % (ref 3.0–12.0)
Neutro Abs: 4 10*3/uL (ref 1.4–7.7)
Neutrophils Relative %: 57.7 % (ref 43.0–77.0)
Platelets: 311 10*3/uL (ref 150.0–400.0)
RBC: 4.01 Mil/uL (ref 3.87–5.11)
RDW: 14.5 % (ref 11.5–14.6)
WBC: 7 10*3/uL (ref 4.5–10.5)

## 2013-12-04 LAB — LIPID PANEL
Cholesterol: 211 mg/dL — ABNORMAL HIGH (ref 0–200)
HDL: 52.1 mg/dL (ref >39.00)
Total CHOL/HDL Ratio: 4
Triglycerides: 162 mg/dL — ABNORMAL HIGH (ref 0.0–149.0)
VLDL: 32.4 mg/dL (ref 0.0–40.0)

## 2013-12-04 LAB — TSH: TSH: 1.05 u[IU]/mL (ref 0.35–5.50)

## 2013-12-05 ENCOUNTER — Encounter: Payer: Self-pay | Admitting: *Deleted

## 2013-12-11 ENCOUNTER — Ambulatory Visit (INDEPENDENT_AMBULATORY_CARE_PROVIDER_SITE_OTHER): Payer: BC Managed Care – PPO | Admitting: Family Medicine

## 2013-12-11 ENCOUNTER — Telehealth: Payer: Self-pay | Admitting: Family Medicine

## 2013-12-11 ENCOUNTER — Encounter: Payer: Self-pay | Admitting: Family Medicine

## 2013-12-11 VITALS — BP 142/78 | HR 106 | Temp 100.2°F | Wt 185.8 lb

## 2013-12-11 DIAGNOSIS — B349 Viral infection, unspecified: Secondary | ICD-10-CM

## 2013-12-11 DIAGNOSIS — B9789 Other viral agents as the cause of diseases classified elsewhere: Secondary | ICD-10-CM

## 2013-12-11 DIAGNOSIS — R509 Fever, unspecified: Secondary | ICD-10-CM

## 2013-12-11 LAB — POCT INFLUENZA A/B
Influenza A, POC: NEGATIVE
Influenza B, POC: NEGATIVE

## 2013-12-11 MED ORDER — VALACYCLOVIR HCL 1 G PO TABS: 2000.0000 mg | ORAL_TABLET | Freq: Two times a day (BID) | ORAL | Status: DC

## 2013-12-11 MED ORDER — BENZONATATE 200 MG PO CAPS: 200.0000 mg | ORAL_CAPSULE | Freq: Three times a day (TID) | ORAL | Status: DC | PRN

## 2013-12-11 NOTE — Telephone Encounter (Signed)
Pt has made appt for 4:15

## 2013-12-11 NOTE — Telephone Encounter (Signed)
Either 4:15 with me or UC.  Would need to open the 4:15 slot.  Thanks.

## 2013-12-11 NOTE — Telephone Encounter (Signed)
Patient Information:  Caller Name: Nasira  Phone: [phone]  Patient: Cassandra Cox, Cassandra Cox  Gender: Female  DOB: [birth year]  Age: 53 Years  PCP: Other  Pregnant: No  Office Follow Up:  Does the office need to follow up with this patient?: Yes  Instructions For The Office: See today per triage; no appts available; please contact pt if able to work in or let her know if she needs to got to urgent care today instead.  RN Note:  Will make appt---no appts, message sent to office  Symptoms  Reason For Call & Symptoms: Cough, nasal and chest congestion, headache, earache, rash on chest (small pink raised spots on chest, very mildly itchy), fever 100-101  Reviewed Health History In EMR: Yes  Reviewed Medications In EMR: Yes  Reviewed Allergies In EMR: Yes  Reviewed Surgeries / Procedures: Yes  Date of Onset of Symptoms: 12/09/2013  Treatments Tried: Advil; Mucinex DM  Treatments Tried Worked: Yes  Any Fever: Yes  Fever Taken: Ear Thermometer  Fever Time Of Reading: 20:00:00  Fever Last Reading: 101 OB / GYN:  LMP: Unknown  Guideline(s) Used:  Colds  Disposition Per Guideline:   See Today in Office  Reason For Disposition Reached:   Sinus pain (not just congestion) and fever  Advice Given:  Reassurance  Colds are very common and may make you feel uncomfortable.  Here is some care advice that should help.  For a Runny Nose With Profuse Discharge:   Nasal mucus and discharge helps to wash viruses and bacteria out of the nose and sinuses.  Blowing the nose is all that is needed.  If the skin around your nostrils gets irritated, apply a tiny amount of petroleum ointment to the nasal openings once or twice a day.  For a Stuffy Nose - Use Nasal Washes:  Introduction: Saline (salt water) nasal irrigation (nasal wash) is an effective and simple home remedy for treating stuffy nose and sinus congestion. The nose can be irrigated by pouring, spraying, or squirting salt water into the nose  and then letting it run back out.  How it Helps: The salt water rinses out excess mucus, washes out any irritants (dust, allergens) that might be present, and moistens the nasal cavity.  Methods: There are several ways to perform nasal irrigation. You can use a saline nasal spray bottle (available over-the-counter), a rubber ear syringe, a medical syringe without the needle, or a Neti Pot.  Treatment for Associated Symptoms of Colds:  For muscle aches, headaches, or moderate fever (more than 101 F or 38.9 C): Take acetaminophen every 4 hours.  Sore throat: Try throat lozenges, hard candy, or warm chicken broth.  Cough: Use cough drops.  Hydrate: Drink adequate liquids.  Humidifier:  If the air in your home is dry, use a cool-mist humidifier  Call Back If:  Difficulty breathing occurs  You become worse  Patient Will Follow Care Advice:  YES

## 2013-12-11 NOTE — Patient Instructions (Signed)
Take tessalon for cough if needed.  Drink plenty of fluids, take tylenol as needed, and gargle with warm salt water for your throat.  This should gradually improve.  Take care.  Let us know if you have other concerns.

## 2013-12-11 NOTE — Progress Notes (Signed)
Pre-visit discussion using our clinic review tool. No additional management support is needed unless otherwise documented below in the visit note.  duration of symptoms: a few days rhinorrhea:yes congestion:yes ear pain:yes, B sore throat:yes cough:yes myalgias:yes Initially with a rash on the B upper chest.  Mildly itchy, not very itchy.  Feels like a cold sore is starting on her lips.  mucinex helps the cough.  Out of work today.  Sinus pressure noted.  ROS: See HPI.  Otherwise negative.    Meds, vitals, and allergies reviewed.   GEN: nad, alert and oriented HEENT: mucous membranes moist, TM w/o erythema, nasal epithelium injected, OP with cobblestoning, sinuses not ttp NECK: supple w/o LA CV: rrr. PULM: ctab, no inc wob ABD: soft, +bs EXT: no edema Skin: faint red rash on the B upper chest, blanching, no ulceration.

## 2013-12-12 DIAGNOSIS — B349 Viral infection, unspecified: Secondary | ICD-10-CM | POA: Insufficient documentation

## 2013-12-12 NOTE — Assessment & Plan Note (Signed)
Flu neg, likely nonflu viral process with antecedent rash. Nontoxic. No indication for abx.  Supportive tx.  Ctab.  Tessalon for cough. Valtrex for likely developing oral cold sore. She agrees.

## 2013-12-15 ENCOUNTER — Telehealth: Payer: Self-pay | Admitting: Family Medicine

## 2013-12-15 NOTE — Telephone Encounter (Signed)
Call pt.  I would try to get her in tomorrow.  If she is truly SOB, then she needs to be seen today either here or at UC/ER.   I will notify PCP as FYI.  Thanks.

## 2013-12-15 NOTE — Telephone Encounter (Signed)
I think that is reasonable.  Thanks.  Routed to PCP as FYI.

## 2013-12-15 NOTE — Telephone Encounter (Signed)
Patient Information:  Caller Name: Ericka  Phone: [phone]  Patient: Cassandra Cox, Cassandra Cox  Gender: Female  DOB: 1961/03/23  Age: 53 Years  PCP: Arnette Norris [facility])  Pregnant: No  Office Follow Up:  Does the office need to follow up with this patient?: Yes  Instructions For The Office: No appt available for today. No other office works.  Please contact patient.  RN Note:  No appt available for today. No other office works.  Please contact patient.  Symptoms  Reason For Call & Symptoms: Patient states she was seen in the office on Thursday 12/11/13 and evaluated by Dr. Damita Dunnings for fever and congestion. Dx with Virus.  Patient states she is cough occasional productive clear to yellow, +runny nose-clear, sinus pressure and headache.  Temperature today 99.5-100.0 (E), tightness in her chest and short of breath at times.  Reviewed Health History In EMR: Yes  Reviewed Medications In EMR: Yes  Reviewed Allergies In EMR: Yes  Reviewed Surgeries / Procedures: Yes  Date of Onset of Symptoms: 12/11/2013  Treatments Tried: Tessalon Pearls, advil  Treatments Tried Worked: No  Any Fever: Yes  Fever Taken: Ear Thermometer  Fever Time Of Reading: 14:00:00  Fever Last Reading: 99.7 OB / GYN:  LMP: Unknown  Guideline(s) Used:  Cough  Sinus Pain and Congestion  Disposition Per Guideline:   See Today in Office  Reason For Disposition Reached:   Fever present > 3 days (72 hours)  Advice Given:  For a Runny Nose With Profuse Discharge:  Nasal mucus and discharge helps to wash viruses and bacteria out of the nose and sinuses.  Blowing the nose is all that is needed.  Hydration:  Drink plenty of liquids (6-8 glasses of water daily). If the air in your home is dry, use a cool mist humidifier  Pain and Fever Medicines:  For pain or fever relief, take either acetaminophen or ibuprofen.  They are over-the-counter (OTC) drugs that help treat both fever and pain. You can buy them at the  drugstore.  Treat fevers above 101 F (38.3 C). The goal of fever therapy is to bring the fever down to a comfortable level. Remember that fever medicine usually lowers fever 2 degrees F (1 - 1 1/2 degrees C).  Acetaminophen (e.g., Tylenol):  Regular Strength Tylenol: Take 650 mg (two 325 mg pills) by mouth every 4-6 hours as needed. Each Regular Strength Tylenol pill has 325 mg of acetaminophen.  RN Overrode Recommendation:  Make Appointment  No appt available for today. No other office works.  Please contact patient.

## 2013-12-15 NOTE — Telephone Encounter (Signed)
Patient notified as instructed by telephone. Was advised that she has had a fever and SOB that comes and goes. Patient states that she is afraid that she may have pneumonia. After discussing this with patient she agreed that she would go to an UC to have this checked out today.

## 2013-12-18 ENCOUNTER — Ambulatory Visit (INDEPENDENT_AMBULATORY_CARE_PROVIDER_SITE_OTHER): Payer: BC Managed Care – PPO | Admitting: Internal Medicine

## 2013-12-18 ENCOUNTER — Encounter: Payer: Self-pay | Admitting: Internal Medicine

## 2013-12-18 VITALS — BP 116/74 | HR 96 | Temp 98.5°F | Resp 14 | Ht 62.75 in | Wt 187.5 lb

## 2013-12-18 DIAGNOSIS — J019 Acute sinusitis, unspecified: Secondary | ICD-10-CM

## 2013-12-18 MED ORDER — AMOXICILLIN 500 MG PO CAPS: 500.0000 mg | ORAL_CAPSULE | Freq: Three times a day (TID) | ORAL | Status: DC

## 2013-12-18 NOTE — Patient Instructions (Signed)
Hold Nitrofurantoin while on new antibiotic. Plain Mucinex (NOT D) for thick secretions ;force NON dairy fluids .   Nasal cleansing in the shower as discussed with lather of mild shampoo.After 10 seconds wash off lather while  exhaling through nostrils. Make sure that all residual soap is removed to prevent irritation.  Flonase OR Nasacort AQ 1 spray in each nostril twice a day as needed. Use the "crossover" technique into opposite nostril spraying toward opposite ear @ 45 degree angle, not straight up into nostril.  Use a Neti pot daily only  as needed for significant sinus congestion; going from open side to congested side . Plain Allegra (NOT D )  160 daily , Loratidine 10 mg , OR Zyrtec 10 mg @ bedtime  as needed for itchy eyes & sneezing.

## 2013-12-18 NOTE — Progress Notes (Signed)
Pre visit review using our clinic review tool, if applicable. No additional management support is needed unless otherwise documented below in the visit note/SLS  

## 2013-12-18 NOTE — Progress Notes (Signed)
   Subjective:    Patient ID: Cassandra Cox, female    DOB: 11/13/1960, 53 y.o.   MRN: [id number]  HPI    Her symptoms actually began 12/08/13 as a rash over the upper thorax and lower neck area. This was associated with head congestion and achy "flulike "sensation.  She was seen 2/5 and diagnosed with a viral syndrome. Her flu swab was negative  Since that time she's had intermittent temperatures up to 101. She's also had some chest tightness  She describes facial sinus pain and intermittent bloody nasal discharge  She describes a scratchy throat.  She's had intermittent pain in the ears without associated discharge  She's had a cough which has been nonproductive for the most part; she describes scant  Yellow sputum production    She has the chest tightness without associated wheezing or shortness of breath     Review of Systems She is not having frontal sinus pain or dental pain. She does take montelukast from her allergist; no PMH of asthma  She is on chronic bladder suppression therapy with the nitrofurantoin from a urologist.     Objective:   Physical Exam  General appearance:good health ;well nourished; no acute distress or increased work of breathing is present.  No  lymphadenopathy about the head, neck, or axilla noted.   Eyes: No conjunctival inflammation or lid edema is present.  Ears:  External ear exam shows no significant lesions or deformities.  Otoscopic examination reveals clear canals, tympanic membranes are intact bilaterally without bulging, retraction, inflammation or discharge.  Nose:  External nasal examination shows no deformity or inflammation. Nasal mucosa are pink and moist without lesions or exudates. No septal dislocation or deviation.No obstruction to airflow.   Oral exam: Dental hygiene is good; lips and gums are healthy appearing.There is no oropharyngeal erythema or exudate noted.   Neck:  No deformities, masses, or tenderness noted.   Supple  with full range of motion without pain.   Heart:  Normal rate and regular rhythm. S1 and S2 normal without gallop, murmur, click, rub or other extra sounds.   Lungs:Chest clear to auscultation; no wheezes, rhonchi,rales ,or rubs present.No increased work of breathing.    Extremities:  No cyanosis, edema, or clubbing  noted    Skin: Warm & dry . She has diffuse punctate erythematous lesions over the upper thorax which blanch with pressure.        Assessment & Plan:  #1 sinusitis, acute  #2 vascular dermatitis over the upper chest  Plan: See orders and recommendations

## 2014-01-02 ENCOUNTER — Encounter: Payer: Self-pay | Admitting: Family Medicine

## 2014-01-02 ENCOUNTER — Ambulatory Visit (INDEPENDENT_AMBULATORY_CARE_PROVIDER_SITE_OTHER): Payer: BC Managed Care – PPO | Admitting: Family Medicine

## 2014-01-02 VITALS — BP 120/80 | HR 88 | Temp 98.1°F | Ht 62.75 in | Wt 193.8 lb

## 2014-01-02 DIAGNOSIS — R141 Gas pain: Secondary | ICD-10-CM

## 2014-01-02 DIAGNOSIS — M545 Low back pain, unspecified: Secondary | ICD-10-CM

## 2014-01-02 DIAGNOSIS — R142 Eructation: Secondary | ICD-10-CM

## 2014-01-02 DIAGNOSIS — R143 Flatulence: Secondary | ICD-10-CM

## 2014-01-02 DIAGNOSIS — R350 Frequency of micturition: Secondary | ICD-10-CM

## 2014-01-02 DIAGNOSIS — R140 Abdominal distension (gaseous): Secondary | ICD-10-CM

## 2014-01-02 DIAGNOSIS — J189 Pneumonia, unspecified organism: Secondary | ICD-10-CM

## 2014-01-02 LAB — POCT URINALYSIS DIPSTICK
Bilirubin, UA: NEGATIVE
Blood, UA: NEGATIVE
Glucose, UA: NEGATIVE
Ketones, UA: NEGATIVE
Leukocytes, UA: NEGATIVE
Nitrite, UA: NEGATIVE
Protein, UA: NEGATIVE
Spec Grav, UA: 1.015
Urobilinogen, UA: 0.2
pH, UA: 6

## 2014-01-02 MED ORDER — DOXYCYCLINE HYCLATE 100 MG PO TABS: 100.0000 mg | ORAL_TABLET | Freq: Two times a day (BID) | ORAL | Status: DC

## 2014-01-02 NOTE — Assessment & Plan Note (Signed)
Possibly due to recent antibiotics and GI bacterial imbalance. Discussed continued use of probiotics.

## 2014-01-02 NOTE — Assessment & Plan Note (Signed)
UA clear but pt reports in past she has had nml UA with positive culture... Will send for culture.

## 2014-01-02 NOTE — Assessment & Plan Note (Signed)
Almost 1 month of respiratory symptoms... Will broaden to doxy x 10 days. If not improving eval with CXR. Not performed today given lungs clear on exam.

## 2014-01-02 NOTE — Progress Notes (Signed)
   Subjective:    Patient ID: Cassandra Cox, female    DOB: [birth year], 53 y.o.   MRN: [id number]  Fever  Pertinent negatives include no abdominal pain, chest pain or ear pain.  Back Pain Associated symptoms include a fever. Pertinent negatives include no abdominal pain, chest pain or dysuria.    53 year old female pt of Dr. Hulen Shouts presents with several ongoing issues.  She reports  She has been feeling ill on 2/2 dx with  Viral infection. She did not improve. Low grade fever, nasal congestion Seen on 2/12 for bacterial URI.. Treated with amoxicillin for sinus infection. She felt some better amoxcillin, never completely better.  Now currently dizzy, fatigued, headache, neck ache. She continues to have low grade fever, nasal congestion, cough.  Using nasal irrigation and flonase.   Nonsmoker.  She has started urinary frequency x 1 month ( on macrobid in past for frequent UTI by Dr. Jacqlyn Larsen, but had been off for a while)  Occ dysuria x 1 month. No diarrhea. Abdominal bloating.  Chest heaviness... Has to takes deep breaths.  Has been around people with atypical PNA.  Review of Systems  Constitutional: Positive for fever. Negative for fatigue.  HENT: Negative for ear pain.   Eyes: Negative for pain.  Respiratory: Negative for chest tightness and shortness of breath.   Cardiovascular: Negative for chest pain, palpitations and leg swelling.  Gastrointestinal: Negative for abdominal pain.  Genitourinary: Negative for dysuria.  Musculoskeletal: Positive for back pain.       Objective:   Physical Exam  Constitutional: Vital signs are normal. She appears well-developed and well-nourished. She is cooperative.  Non-toxic appearance. She does not appear ill. No distress.  Fatigued appearing female in NAD.  HENT:  Head: Normocephalic.  Right Ear: Hearing, tympanic membrane, external ear and ear canal normal. Tympanic membrane is not erythematous, not retracted and not bulging.    Left Ear: Hearing, tympanic membrane, external ear and ear canal normal. Tympanic membrane is not erythematous, not retracted and not bulging.  Nose: Mucosal edema and rhinorrhea present. Right sinus exhibits no maxillary sinus tenderness and no frontal sinus tenderness. Left sinus exhibits no maxillary sinus tenderness and no frontal sinus tenderness.  Mouth/Throat: Uvula is midline, oropharynx is clear and moist and mucous membranes are normal.  Eyes: Conjunctivae, EOM and lids are normal. Pupils are equal, round, and reactive to light. Lids are everted and swept, no foreign bodies found.  Neck: Trachea normal and normal range of motion. Neck supple. Carotid bruit is not present. No mass and no thyromegaly present.  Cardiovascular: Normal rate, regular rhythm, S1 normal, S2 normal, normal heart sounds, intact distal pulses and normal pulses.  Exam reveals no gallop and no friction rub.   No murmur heard. Pulmonary/Chest: Effort normal and breath sounds normal. Not tachypneic. No respiratory distress. She has no decreased breath sounds. She has no wheezes. She has no rhonchi. She has no rales.  Abdominal: There is no hepatosplenomegaly. There is generalized tenderness. There is no CVA tenderness.  Musculoskeletal:  Diffuse muscular ttp over back  Neurological: She is alert.  Skin: Skin is warm, dry and intact. No rash noted.  Psychiatric: Her speech is normal and behavior is normal. Judgment normal. Her mood appears not anxious. Cognition and memory are normal. She does not exhibit a depressed mood.          Assessment & Plan:

## 2014-01-02 NOTE — Patient Instructions (Addendum)
Start doxycycline x 10 days. Start mucinex DM and continue nasal irrigation. Fluid. Continue probiotic while on antibiotic. We will call with urine culture report.  Call if not improving as expected.

## 2014-01-02 NOTE — Progress Notes (Signed)
Pre visit review using our clinic review tool, if applicable. No additional management support is needed unless otherwise documented below in the visit note. 

## 2014-01-04 LAB — URINE CULTURE: Colony Count: 30000

## 2014-07-01 ENCOUNTER — Other Ambulatory Visit: Payer: Self-pay | Admitting: Family Medicine

## 2014-07-01 NOTE — Telephone Encounter (Signed)
Spoke to pt and informed her an OV with labs required. Pt states that she has enough meds to last until scheduled appt 9/9.

## 2014-07-02 ENCOUNTER — Other Ambulatory Visit: Payer: Self-pay | Admitting: *Deleted

## 2014-07-02 MED ORDER — SIMVASTATIN 20 MG PO TABS: 20.0000 mg | ORAL_TABLET | Freq: Every day | ORAL | Status: DC

## 2014-07-15 ENCOUNTER — Ambulatory Visit (INDEPENDENT_AMBULATORY_CARE_PROVIDER_SITE_OTHER): Payer: BC Managed Care – PPO | Admitting: Family Medicine

## 2014-07-15 ENCOUNTER — Encounter: Payer: Self-pay | Admitting: Family Medicine

## 2014-07-15 VITALS — BP 128/78 | HR 85 | Temp 98.2°F | Wt 188.0 lb

## 2014-07-15 DIAGNOSIS — E785 Hyperlipidemia, unspecified: Secondary | ICD-10-CM

## 2014-07-15 DIAGNOSIS — F3289 Other specified depressive episodes: Secondary | ICD-10-CM

## 2014-07-15 DIAGNOSIS — E559 Vitamin D deficiency, unspecified: Secondary | ICD-10-CM

## 2014-07-15 DIAGNOSIS — F329 Major depressive disorder, single episode, unspecified: Secondary | ICD-10-CM

## 2014-07-15 DIAGNOSIS — R5383 Other fatigue: Secondary | ICD-10-CM

## 2014-07-15 DIAGNOSIS — Z23 Encounter for immunization: Secondary | ICD-10-CM

## 2014-07-15 DIAGNOSIS — Z833 Family history of diabetes mellitus: Secondary | ICD-10-CM | POA: Insufficient documentation

## 2014-07-15 DIAGNOSIS — R5381 Other malaise: Secondary | ICD-10-CM | POA: Insufficient documentation

## 2014-07-15 DIAGNOSIS — F411 Generalized anxiety disorder: Secondary | ICD-10-CM

## 2014-07-15 LAB — COMPREHENSIVE METABOLIC PANEL
ALT: 27 U/L (ref 0–35)
AST: 26 U/L (ref 0–37)
Albumin: 4 g/dL (ref 3.5–5.2)
Alkaline Phosphatase: 68 U/L (ref 39–117)
BUN: 18 mg/dL (ref 6–23)
CO2: 26 mEq/L (ref 19–32)
Calcium: 9.2 mg/dL (ref 8.4–10.5)
Chloride: 102 mEq/L (ref 96–112)
Creatinine, Ser: 0.7 mg/dL (ref 0.4–1.2)
GFR: 94.02 mL/min (ref >60.00)
Glucose, Bld: 91 mg/dL (ref 70–99)
Potassium: 4.1 mEq/L (ref 3.5–5.1)
Sodium: 137 mEq/L (ref 135–145)
Total Bilirubin: 0.5 mg/dL (ref 0.2–1.2)
Total Protein: 7.6 g/dL (ref 6.0–8.3)

## 2014-07-15 LAB — LIPID PANEL
Cholesterol: 218 mg/dL — ABNORMAL HIGH (ref 0–200)
HDL: 51.3 mg/dL (ref >39.00)
LDL Cholesterol: 129 mg/dL — ABNORMAL HIGH (ref 0–99)
NonHDL: 166.7
Total CHOL/HDL Ratio: 4
Triglycerides: 191 mg/dL — ABNORMAL HIGH (ref 0.0–149.0)
VLDL: 38.2 mg/dL (ref 0.0–40.0)

## 2014-07-15 LAB — TSH: TSH: 0.72 u[IU]/mL (ref 0.35–4.50)

## 2014-07-15 LAB — HEMOGLOBIN A1C: Hgb A1c MFr Bld: 6.2 % (ref 4.6–6.5)

## 2014-07-15 LAB — VITAMIN D 25 HYDROXY (VIT D DEFICIENCY, FRACTURES): VITD: 59.43 ng/mL (ref 30.00–100.00)

## 2014-07-15 LAB — VITAMIN B12: Vitamin B-12: >1500 pg/mL — ABNORMAL HIGH (ref 211–911)

## 2014-07-15 MED ORDER — VALACYCLOVIR HCL 1 G PO TABS: 2000.0000 mg | ORAL_TABLET | Freq: Two times a day (BID) | ORAL | Status: DC

## 2014-07-15 NOTE — Patient Instructions (Signed)
Great to see you.  We will you with your lab results.

## 2014-07-15 NOTE — Assessment & Plan Note (Signed)
New- likely due to busy lifestyle and stressors but will check labs today to rule out other possible contributing factors.

## 2014-07-15 NOTE — Assessment & Plan Note (Signed)
Now with worsening fatigue. Will check Vit D today.

## 2014-07-15 NOTE — Progress Notes (Signed)
Pre visit review using our clinic review tool, if applicable. No additional management support is needed unless otherwise documented below in the visit note. 

## 2014-07-15 NOTE — Assessment & Plan Note (Signed)
Continue Zocor. Recheck lipid panel today.

## 2014-07-15 NOTE — Addendum Note (Signed)
Addended by: Carter Kitten on: 07/15/2014 12:23 PM   Modules accepted: Orders

## 2014-07-15 NOTE — Assessment & Plan Note (Signed)
a1c today 

## 2014-07-15 NOTE — Progress Notes (Signed)
53 yo female who has not been seen since 06/2013  for routine care, here for follow up.     HLD-  Taking Zocor 20 mg daily.  No adverse effects.    Lab Results  Component Value Date   CHOL 211* 12/04/2013   HDL 52.10 12/04/2013   LDLCALC 84 08/26/2010   LDLDIRECT 142.2 12/04/2013   TRIG 162.0* 12/04/2013   CHOLHDL 4 12/04/2013     Lab Results  Component Value Date   ALT 22 12/04/2013   AST 16 12/04/2013   ALKPHOS 69 12/04/2013   BILITOT 0.6 12/04/2013    Depression- Taking Wellbutrin 150 mg daily, Prozac 20 mg daily, as needed Xanax.  Feels she is doing ok.  Does have stressors with her younger daughter recently diagnosed with Drue Dun. Sleeping ok.  Boyfriend is very supportive. Denies SI or HI.  Patient Active Problem List  Diagnoses  . VITAMIN D DEFICIENCY  . HYPERLIPIDEMIA  . ANXIETY  . DEPRESSION  . ACTINIC KERATOSIS  . ACNE VULGARIS  . OTHER ACUTE SINUSITIS  . Routine general medical examination at a health care facility   No past medical history on file. No past surgical history on file. History  Substance Use Topics  . Smoking status: Not on file  . Smokeless tobacco: Not on file  . Alcohol Use: Not on file   No family history on file. Allergies  Allergen Reactions  . Prednisone     REACTION: Swelling  . Sulfonamide Derivatives     REACTION: Hives   Current Outpatient Prescriptions on File Prior to Visit  Medication Sig Dispense Refill  . simvastatin (ZOCOR) 20 MG tablet Take 1 tablet (20 mg total) by mouth at bedtime.  90 tablet  3   The PMH, PSH, Social History, Family History, Medications, and allergies have been reviewed in [facility], and have been updated if relevant.   ROS: See HPI Denies any muscle aches No CP or SOB +fatigue- does have h/o vit D deficiency No DOE No rashes  Physical exam: BP 128/78  Pulse 85  Temp(Src) 98.2 F (36.8 C) (Oral)  Wt 188 lb (85.276 kg)  SpO2 98%  General:  Well-developed,well-nourished,in no acute  distress; alert,appropriate and cooperative throughout examination Head:  normocephalic and atraumatic.   Resp:  CTA bilaterally CVS:  RRR Psych:  Cognition and judgment appear intact. Alert and cooperative with normal attention span and concentration. No apparent delusions, illusions, hallucinations

## 2014-07-15 NOTE — Assessment & Plan Note (Signed)
Well controlled on current rx. No changes made today. 

## 2014-07-17 ENCOUNTER — Encounter: Payer: Self-pay | Admitting: *Deleted

## 2014-10-28 ENCOUNTER — Telehealth: Payer: Self-pay | Admitting: Family Medicine

## 2014-10-28 NOTE — Telephone Encounter (Signed)
Yes- please schedule appt.  

## 2014-10-28 NOTE — Telephone Encounter (Signed)
Patient found out she's pre-diabetic.  She wants to know if you wanted her to follow up with lab work in [place]?

## 2014-10-29 NOTE — Telephone Encounter (Signed)
Lm on pts vm and advised per Dr Deborra Medina that she is needing f/u labs. Pt advised to contact office to have scheduled.

## 2014-11-05 ENCOUNTER — Ambulatory Visit (INDEPENDENT_AMBULATORY_CARE_PROVIDER_SITE_OTHER): Payer: BC Managed Care – PPO | Admitting: Internal Medicine

## 2014-11-05 ENCOUNTER — Encounter: Payer: Self-pay | Admitting: Internal Medicine

## 2014-11-05 VITALS — BP 126/78 | HR 87 | Temp 98.6°F | Wt 189.0 lb

## 2014-11-05 DIAGNOSIS — J0100 Acute maxillary sinusitis, unspecified: Secondary | ICD-10-CM

## 2014-11-05 MED ORDER — AMOXICILLIN 875 MG PO TABS: 875.0000 mg | ORAL_TABLET | Freq: Two times a day (BID) | ORAL | Status: DC

## 2014-11-05 MED ORDER — BENZONATATE 200 MG PO CAPS: 200.0000 mg | ORAL_CAPSULE | Freq: Two times a day (BID) | ORAL | Status: DC | PRN

## 2014-11-05 NOTE — Progress Notes (Signed)
Pre visit review using our clinic review tool, if applicable. No additional management support is needed unless otherwise documented below in the visit note. 

## 2014-11-05 NOTE — Patient Instructions (Signed)
Cough, Adult  A cough is a reflex that helps clear your throat and airways. It can help heal the body or may be a reaction to an irritated airway. A cough may only last 2 or 3 weeks (acute) or may last more than 8 weeks (chronic).  CAUSES Acute cough:  Viral or bacterial infections. Chronic cough:  Infections.  Allergies.  Asthma.  Post-nasal drip.  Smoking.  Heartburn or acid reflux.  Some medicines.  Chronic lung problems (COPD).  Cancer. SYMPTOMS   Cough.  Fever.  Chest pain.  Increased breathing rate.  High-pitched whistling sound when breathing (wheezing).  Colored mucus that you cough up (sputum). TREATMENT   A bacterial cough may be treated with antibiotic medicine.  A viral cough must run its course and will not respond to antibiotics.  Your caregiver may recommend other treatments if you have a chronic cough. HOME CARE INSTRUCTIONS   Only take over-the-counter or prescription medicines for pain, discomfort, or fever as directed by your caregiver. Use cough suppressants only as directed by your caregiver.  Use a cold steam vaporizer or humidifier in your bedroom or home to help loosen secretions.  Sleep in a semi-upright position if your cough is worse at night.  Rest as needed.  Stop smoking if you smoke. SEEK IMMEDIATE MEDICAL CARE IF:   You have pus in your sputum.  Your cough starts to worsen.  You cannot control your cough with suppressants and are losing sleep.  You begin coughing up blood.  You have difficulty breathing.  You develop pain which is getting worse or is uncontrolled with medicine.  You have a fever. MAKE SURE YOU:   Understand these instructions.  Will watch your condition.  Will get help right away if you are not doing well or get worse. Document Released: 04/21/2011 Document Revised: 01/15/2012 Document Reviewed: 04/21/2011 Exit[employer] Patient Information 2015 [employer]. This information is not intended  to replace advice given to you by your health care provider. Make sure you discuss any questions you have with your health care provider.  

## 2014-11-05 NOTE — Progress Notes (Signed)
HPI  Pt presents to the clinic today with c/o cough and chest congestion. She reports this started 2 weeks ago. Her cough is productive of yellow/green mucous. She does have some associated headache and fatigue. She denies fever, chills or body aches. She has tried Delsym and Mucinex OTC without relief. She has no history of allergies or breathing problems. She has not had sick contacts.  Review of Systems     No past medical history on file.  No family history on file.  History   Social History  . Marital Status: Divorced    Spouse Name: N/A    Number of Children: N/A  . Years of Education: N/A   Occupational History  . Not on file.   Social History Main Topics  . Smoking status: Never Smoker   . Smokeless tobacco: Never Used  . Alcohol Use: No  . Drug Use: No  . Sexual Activity: Not on file   Other Topics Concern  . Not on file   Social History Narrative    Allergies  Allergen Reactions  . Prednisone     REACTION: Swelling  . Sulfonamide Derivatives     REACTION: Hives     Constitutional: Positive headache, fatigue. Denies fever or abrupt weight changes.  HEENT:  Positive nasal congestion and sore throat. Denies eye redness, eye pain, pressure behind the eyes, facial pain,  ear pain, ringing in the ears, wax buildup, runny nose or bloody nose. Respiratory: Positive cough. Denies difficulty breathing or shortness of breath.  Cardiovascular: Denies chest pain, chest tightness, palpitations or swelling in the hands or feet.   No other specific complaints in a complete review of systems (except as listed in HPI above).  Objective:   BP 126/78 mmHg  Pulse 87  Temp(Src) 98.6 F (37 C) (Oral)  Wt 189 lb (85.73 kg)  SpO2 97%  Wt Readings from Last 3 Encounters:  11/05/14 189 lb (85.73 kg)  07/15/14 188 lb (85.276 kg)  01/02/14 193 lb 12 oz (87.884 kg)     General: Appears her stated age, well developed, well nourished in NAD. HEENT: Head: normal shape and  size, maxillary sinus tenderness noted; Eyes: sclera white, no icterus, conjunctiva pink; Ears: Tm's gray and intact, normal light reflex; Nose: mucosa pink and moist, septum midline; Throat/Mouth: + PND. Teeth present, mucosa erythematous and moist, no exudate noted, no lesions or ulcerations noted.  Neck: No cervical lymphadenopathy.   Cardiovascular: Normal rate and rhythm. S1,S2 noted.  No murmur, rubs or gallops noted. Pulmonary/Chest: Normal effort and positive vesicular breath sounds. No respiratory distress. No wheezes, rales or ronchi noted.      Assessment & Plan:   Acute maxillary sinusitis:  Get some rest and drink plenty of water Flonase daily for nasal congestion Continue singulair eRx for Amoxil BID x 10 days eRx for Tessalon pearls for cough  RTC as needed or if symptoms persist.

## 2014-11-09 ENCOUNTER — Telehealth: Payer: Self-pay | Admitting: Internal Medicine

## 2014-11-09 LAB — HM MAMMOGRAPHY: HM Mammogram: NORMAL

## 2014-11-09 NOTE — Telephone Encounter (Signed)
She would need to be reevaluated  first

## 2014-11-09 NOTE — Telephone Encounter (Signed)
Pt is coming in today with her daughter at 12:15 and would like to know while she is here if she could get a chest x-ray? Pt was seen 11/05/14 and is now wheezing. Please advise.

## 2014-11-09 NOTE — Telephone Encounter (Signed)
appt made with Dr. Deborra Medina 11/10/14

## 2014-11-10 ENCOUNTER — Ambulatory Visit: Payer: Self-pay | Admitting: Family Medicine

## 2015-05-27 ENCOUNTER — Telehealth: Payer: Self-pay

## 2015-05-27 ENCOUNTER — Encounter: Payer: Self-pay | Admitting: Family Medicine

## 2015-05-27 NOTE — Telephone Encounter (Signed)
Called patient to notify her of being due for a mammogram. Patient stated that she has had a mammogram already on November 09, 2014. Patient states that she had it done at [facility] on [address] in [place]. Chart abstracted.

## 2015-07-21 ENCOUNTER — Telehealth: Payer: Self-pay | Admitting: Family Medicine

## 2015-07-21 ENCOUNTER — Ambulatory Visit (INDEPENDENT_AMBULATORY_CARE_PROVIDER_SITE_OTHER): Payer: BLUE CROSS/BLUE SHIELD | Admitting: Internal Medicine

## 2015-07-21 ENCOUNTER — Encounter: Payer: Self-pay | Admitting: Internal Medicine

## 2015-07-21 VITALS — BP 112/76 | HR 84 | Temp 98.2°F | Wt 190.0 lb

## 2015-07-21 DIAGNOSIS — L03211 Cellulitis of face: Secondary | ICD-10-CM | POA: Diagnosis not present

## 2015-07-21 MED ORDER — CEPHALEXIN 500 MG PO CAPS: 500.0000 mg | ORAL_CAPSULE | Freq: Three times a day (TID) | ORAL | Status: DC

## 2015-07-21 NOTE — Progress Notes (Signed)
Subjective:    Patient ID: Cassandra Cox, female    DOB: May 05, 1961, 54 y.o.   MRN: [id number]  HPI  Pt presents to the clinic today with c/o a bump over her right eye. She noticed it this morning. She has noticed redness and swelling in the area. She reports she did press on it, and some pus came out. She denies fever, chills or body aches. She has not tried anything OTC. She denies blurred vision. She has never had anything like this in the past.  Review of Systems      No past medical history on file.  Current Outpatient Prescriptions  Medication Sig Dispense Refill  . ALPRAZolam (XANAX) 1 MG tablet Take 1 mg by mouth at bedtime as needed.      Marland Kitchen buPROPion (WELLBUTRIN XL) 150 MG 24 hr tablet Take 150 mg by mouth daily.      . Cholecalciferol (VITAMIN D-3) 5000 UNITS TABS Take 1 tablet by mouth daily.      . fish oil-omega-3 fatty acids 1000 MG capsule Take 1 g by mouth daily.      Marland Kitchen FLUoxetine (PROZAC) 20 MG tablet Take 20 mg by mouth daily.      . magnesium gluconate (MAGONATE) 500 MG tablet Take 1,000 mg by mouth at bedtime.     . montelukast (SINGULAIR) 10 MG tablet Take 10 mg by mouth at bedtime.    Marland Kitchen omeprazole (PRILOSEC) 20 MG capsule Take 20 mg by mouth daily.    Marland Kitchen triamcinolone (NASACORT ALLERGY 24HR) 55 MCG/ACT AERO nasal inhaler Place 2 sprays into the nose daily.    . valACYclovir (VALTREX) 1000 MG tablet Take 2 tablets (2,000 mg total) by mouth 2 (two) times daily. For 1 day 12 tablet 1  . zolpidem (AMBIEN CR) 12.5 MG CR tablet Take 12.5 mg by mouth at bedtime as needed.      . simvastatin (ZOCOR) 20 MG tablet Take 1 tablet (20 mg total) by mouth at bedtime. (Patient not taking: Reported on 07/21/2015) 90 tablet 1   No current facility-administered medications for this visit.    Allergies  Allergen Reactions  . Prednisone     REACTION: Swelling  . Sulfonamide Derivatives     REACTION: Hives    No family history on file.  Social History   Social History    . Marital Status: Divorced    Spouse Name: N/A  . Number of Children: N/A  . Years of Education: N/A   Occupational History  . Not on file.   Social History Main Topics  . Smoking status: Never Smoker   . Smokeless tobacco: Never Used  . Alcohol Use: No  . Drug Use: No  . Sexual Activity: Not on file   Other Topics Concern  . Not on file   Social History Narrative     Constitutional: Denies fever, malaise, fatigue, headache or abrupt weight changes.  HEENT: Denies eye pain, eye redness, ear pain, ringing in the ears, wax buildup, runny nose, nasal congestion, bloody nose, or sore throat. Respiratory: Denies difficulty breathing, shortness of breath, cough or sputum production.   Cardiovascular: Denies chest pain, chest tightness, palpitations or swelling in the hands or feet.  Skin: Pt reports bump above right eye. Denies rashes, lesions or ulcercations.   No other specific complaints in a complete review of systems (except as listed in HPI above).  Objective:   Physical Exam   BP 112/76 mmHg  Pulse 84  Temp(Src) 98.2  F (36.8 C) (Oral)  Wt 190 lb (86.183 kg)  SpO2 97% Wt Readings from Last 3 Encounters:  07/21/15 190 lb (86.183 kg)  11/05/14 189 lb (85.73 kg)  07/15/14 188 lb (85.276 kg)    General: Appears her stated age, well developed, well nourished in NAD. Skin: 1 cm area of cellulitis noted in the brow line above right eye. HEENT: Head: normal shape and size; Eyes: sclera white, no icterus, conjunctiva pink, PERRLA and EOMs intact;  Cardiovascular: Normal rate and rhythm. S1,S2 noted.  No murmur, rubs or gallops noted.  Pulmonary/Chest: Normal effort and positive vesicular breath sounds. No respiratory distress. No wheezes, rales or ronchi noted.   BMET    Component Value Date/Time   NA 137 07/15/2014 1229   K 4.1 07/15/2014 1229   CL 102 07/15/2014 1229   CO2 26 07/15/2014 1229   GLUCOSE 91 07/15/2014 1229   BUN 18 07/15/2014 1229   CREATININE  0.7 07/15/2014 1229   CALCIUM 9.2 07/15/2014 1229   GFRNONAA 94.17 11/03/2009 1133    Lipid Panel     Component Value Date/Time   CHOL 218* 07/15/2014 1229   TRIG 191.0* 07/15/2014 1229   HDL 51.30 07/15/2014 1229   CHOLHDL 4 07/15/2014 1229   VLDL 38.2 07/15/2014 1229   LDLCALC 129* 07/15/2014 1229    CBC    Component Value Date/Time   WBC 7.0 12/04/2013 1516   RBC 4.01 12/04/2013 1516   HGB 12.4 12/04/2013 1516   HCT 38.3 12/04/2013 1516   PLT 311.0 12/04/2013 1516   MCV 95.5 12/04/2013 1516   MCHC 32.4 12/04/2013 1516   RDW 14.5 12/04/2013 1516   LYMPHSABS 2.3 12/04/2013 1516   MONOABS 0.5 12/04/2013 1516   EOSABS 0.1 12/04/2013 1516   BASOSABS 0.0 12/04/2013 1516    Hgb A1C Lab Results  Component Value Date   HGBA1C 6.2 07/15/2014        Assessment & Plan:   Cellulitis of face:  Advised her to try warm compresses TID eRx for Keflex 500 mg TID x 10 days Avoid touching the area Return precautions given  RTC as needed or if symptoms persist or worsen

## 2015-07-21 NOTE — Telephone Encounter (Signed)
Patient Name: Cassandra Cox  DOB: Jun 21, 1961    Initial Comment Caller states her eye may have a boil, right under the eyebrow. Her eye is swelled. Eye pain.   Nurse Assessment  Nurse: Mallie Mussel, RN, Alveta Heimlich Date/Time Eilene Ghazi Time): 07/21/2015 10:51:22 AM  Confirm and document reason for call. If symptomatic, describe symptoms. ---Caller states that she has a boil under her right eyebrow. The eye is swollen and painful. She woke up with this this morning. She states there is pus when she touches the boil. She rates her pain as 8 on 0-10 scale. Denies fever.  Has the patient traveled out of the country within the last 30 days? ---No  Does the patient require triage? ---Yes  Related visit to physician within the last 2 weeks? ---No  Does the PT have any chronic conditions? (i.e. diabetes, asthma, etc.) ---Yes  List chronic conditions. ---Hypercholesterolemia, Depression     Guidelines    Guideline Title Affirmed Question Affirmed Notes  Boil (Skin Abscess) SEVERE pain (e.g., excruciating)    Final Disposition User   See Physician within 4 Hours (or PCP triage) Mallie Mussel, RN, Alveta Heimlich    Comments  Appointment scheduled for 2:15pm today with Webb Silversmith NP.   Referrals  REFERRED TO PCP OFFICE   Disagree/Comply: Comply

## 2015-07-21 NOTE — Telephone Encounter (Signed)
Pt has appt 07/21/15 at 2:15 with Avie Echevaria NP.

## 2015-07-21 NOTE — Patient Instructions (Signed)

## 2015-07-21 NOTE — Progress Notes (Signed)
Pre visit review using our clinic review tool, if applicable. No additional management support is needed unless otherwise documented below in the visit note. 

## 2015-07-22 ENCOUNTER — Other Ambulatory Visit: Payer: Self-pay | Admitting: Family Medicine

## 2015-07-22 DIAGNOSIS — E785 Hyperlipidemia, unspecified: Secondary | ICD-10-CM

## 2015-07-22 DIAGNOSIS — E559 Vitamin D deficiency, unspecified: Secondary | ICD-10-CM

## 2015-07-22 DIAGNOSIS — Z01419 Encounter for gynecological examination (general) (routine) without abnormal findings: Secondary | ICD-10-CM

## 2015-07-27 ENCOUNTER — Other Ambulatory Visit (INDEPENDENT_AMBULATORY_CARE_PROVIDER_SITE_OTHER): Payer: BLUE CROSS/BLUE SHIELD

## 2015-07-27 DIAGNOSIS — Z0000 Encounter for general adult medical examination without abnormal findings: Secondary | ICD-10-CM | POA: Diagnosis not present

## 2015-07-27 DIAGNOSIS — Z833 Family history of diabetes mellitus: Secondary | ICD-10-CM

## 2015-07-27 DIAGNOSIS — E785 Hyperlipidemia, unspecified: Secondary | ICD-10-CM | POA: Diagnosis not present

## 2015-07-27 DIAGNOSIS — E559 Vitamin D deficiency, unspecified: Secondary | ICD-10-CM | POA: Diagnosis not present

## 2015-07-27 DIAGNOSIS — Z01419 Encounter for gynecological examination (general) (routine) without abnormal findings: Secondary | ICD-10-CM

## 2015-07-27 LAB — COMPREHENSIVE METABOLIC PANEL
ALT: 23 U/L (ref 0–35)
AST: 18 U/L (ref 0–37)
Albumin: 4.3 g/dL (ref 3.5–5.2)
Alkaline Phosphatase: 70 U/L (ref 39–117)
BUN: 20 mg/dL (ref 6–23)
CO2: 28 mEq/L (ref 19–32)
Calcium: 9.4 mg/dL (ref 8.4–10.5)
Chloride: 100 mEq/L (ref 96–112)
Creatinine, Ser: 0.65 mg/dL (ref 0.40–1.20)
GFR: 100.34 mL/min (ref >60.00)
Glucose, Bld: 103 mg/dL — ABNORMAL HIGH (ref 70–99)
Potassium: 4.1 mEq/L (ref 3.5–5.1)
Sodium: 137 mEq/L (ref 135–145)
Total Bilirubin: 0.4 mg/dL (ref 0.2–1.2)
Total Protein: 7.7 g/dL (ref 6.0–8.3)

## 2015-07-27 LAB — CBC WITH DIFFERENTIAL/PLATELET
Basophils Absolute: 0 10*3/uL (ref 0.0–0.1)
Basophils Relative: 0.5 % (ref 0.0–3.0)
Eosinophils Absolute: 0.1 10*3/uL (ref 0.0–0.7)
Eosinophils Relative: 0.9 % (ref 0.0–5.0)
HCT: 40.4 % (ref 36.0–46.0)
Hemoglobin: 13.3 g/dL (ref 12.0–15.0)
Lymphocytes Relative: 31.3 % (ref 12.0–46.0)
Lymphs Abs: 2.2 10*3/uL (ref 0.7–4.0)
MCHC: 33 g/dL (ref 30.0–36.0)
MCV: 90.7 fl (ref 78.0–100.0)
Monocytes Absolute: 0.6 10*3/uL (ref 0.1–1.0)
Monocytes Relative: 8.1 % (ref 3.0–12.0)
Neutro Abs: 4.2 10*3/uL (ref 1.4–7.7)
Neutrophils Relative %: 59.2 % (ref 43.0–77.0)
Platelets: 306 10*3/uL (ref 150.0–400.0)
RBC: 4.45 Mil/uL (ref 3.87–5.11)
RDW: 13.8 % (ref 11.5–15.5)
WBC: 7.1 10*3/uL (ref 4.0–10.5)

## 2015-07-27 LAB — LIPID PANEL
Cholesterol: 269 mg/dL — ABNORMAL HIGH (ref 0–200)
HDL: 51.5 mg/dL (ref >39.00)
LDL Cholesterol: 180 mg/dL — ABNORMAL HIGH (ref 0–99)
NonHDL: 217.73
Total CHOL/HDL Ratio: 5
Triglycerides: 190 mg/dL — ABNORMAL HIGH (ref 0.0–149.0)
VLDL: 38 mg/dL (ref 0.0–40.0)

## 2015-07-27 LAB — HEMOGLOBIN A1C: Hgb A1c MFr Bld: 6.2 % (ref 4.6–6.5)

## 2015-07-27 LAB — TSH: TSH: 1.41 u[IU]/mL (ref 0.35–4.50)

## 2015-07-27 LAB — VITAMIN D 25 HYDROXY (VIT D DEFICIENCY, FRACTURES): VITD: 53.14 ng/mL (ref 30.00–100.00)

## 2015-07-28 LAB — HIV ANTIBODY (ROUTINE TESTING W REFLEX): HIV 1&2 Ab, 4th Generation: NONREACTIVE

## 2015-07-28 LAB — HEPATITIS C ANTIBODY: HCV Ab: NEGATIVE

## 2015-08-03 ENCOUNTER — Encounter: Payer: BLUE CROSS/BLUE SHIELD | Admitting: Family Medicine

## 2015-08-04 ENCOUNTER — Telehealth: Payer: Self-pay | Admitting: Family Medicine

## 2015-08-04 NOTE — Telephone Encounter (Signed)
Yes please do schedule an appointment for her.

## 2015-08-04 NOTE — Telephone Encounter (Signed)
Pt did not come in for their appt yesterday 08/03/15 for cpe. Please let me know if pt needs to be contacted immediately for follow up or no follow up needed. Best phone number to contact pt is [phone].

## 2015-08-12 ENCOUNTER — Encounter: Payer: Self-pay | Admitting: Family Medicine

## 2015-08-25 ENCOUNTER — Encounter: Payer: Self-pay | Admitting: Family Medicine

## 2015-08-25 ENCOUNTER — Ambulatory Visit (INDEPENDENT_AMBULATORY_CARE_PROVIDER_SITE_OTHER): Payer: BLUE CROSS/BLUE SHIELD | Admitting: Family Medicine

## 2015-08-25 VITALS — BP 132/76 | HR 94 | Temp 98.1°F | Ht 62.75 in | Wt 191.8 lb

## 2015-08-25 DIAGNOSIS — G4700 Insomnia, unspecified: Secondary | ICD-10-CM | POA: Insufficient documentation

## 2015-08-25 DIAGNOSIS — N951 Menopausal and female climacteric states: Secondary | ICD-10-CM | POA: Insufficient documentation

## 2015-08-25 DIAGNOSIS — Z23 Encounter for immunization: Secondary | ICD-10-CM | POA: Diagnosis not present

## 2015-08-25 DIAGNOSIS — E785 Hyperlipidemia, unspecified: Secondary | ICD-10-CM

## 2015-08-25 DIAGNOSIS — Z01419 Encounter for gynecological examination (general) (routine) without abnormal findings: Secondary | ICD-10-CM

## 2015-08-25 DIAGNOSIS — F329 Major depressive disorder, single episode, unspecified: Secondary | ICD-10-CM | POA: Diagnosis not present

## 2015-08-25 DIAGNOSIS — F32A Depression, unspecified: Secondary | ICD-10-CM

## 2015-08-25 DIAGNOSIS — E559 Vitamin D deficiency, unspecified: Secondary | ICD-10-CM

## 2015-08-25 DIAGNOSIS — Z0000 Encounter for general adult medical examination without abnormal findings: Secondary | ICD-10-CM

## 2015-08-25 MED ORDER — SIMVASTATIN 20 MG PO TABS: 20.0000 mg | ORAL_TABLET | Freq: Every day | ORAL | Status: DC

## 2015-08-25 NOTE — Assessment & Plan Note (Signed)
Deteriorated. Agrees to restart zocor 20 mg daily.

## 2015-08-25 NOTE — Assessment & Plan Note (Addendum)
New- discussed tx options. Suggested OTC Vit E supplementation, also discussed gabapentin and HRT.  Discussed risks and benefits of HRT.  Pt does not have a uterus and and therefore does not need progesterone in addition to estrogen to prevent endometrial hyperplasia.  Prefers to try Vit E first

## 2015-08-25 NOTE — Assessment & Plan Note (Signed)
Stable on current rxs. No changes made. 

## 2015-08-25 NOTE — Progress Notes (Signed)
Pre visit review using our clinic review tool, if applicable. No additional management support is needed unless otherwise documented below in the visit note. 

## 2015-08-25 NOTE — Patient Instructions (Addendum)
Great to see you. Try over the counter Vit E for your hot flashes.  Please keep me updated.  Congratulations on your new addition.  Please restart zocor 20 mg daily.

## 2015-08-25 NOTE — Progress Notes (Signed)
Subjective:   Patient ID: Cassandra Cox, female    DOB: 01/17/1961, 54 y.o.   MRN: [id number]  Cassandra Cox is a pleasant 54 y.o. year old female who presents to clinic today with Annual Exam and Hot Flashes and follow up of chronic medical conditions on 08/25/2015  HPI:  Remote history of hysterectomy. 11/09/14 Colonoscopy 08/13/11  Has been having more hot flashes lately. Remote history hysterectomy- does still have ovaries.  HLD- has not been taking Zocor 20 mg daily as prescribed and LDL is very elevated this month. Did not make her feel bad.  Just did not want to take something daily.  Lab Results  Component Value Date   CHOL 269* 07/27/2015   HDL 51.50 07/27/2015   LDLCALC 180* 07/27/2015   LDLDIRECT 142.2 12/04/2013   TRIG 190.0* 07/27/2015   CHOLHDL 5 07/27/2015   Depression- Taking Wellbutrin 150 mg daily, Prozac 20 mg daily, as needed Xanax.  Feels she is doing ok.  Sleeping ok.  Boyfriend is very supportive. Denies SI or HI.  Does still need to take ambien as needed for insomnia. Lab Results  Component Value Date   NA 137 07/27/2015   K 4.1 07/27/2015   CL 100 07/27/2015   CO2 28 07/27/2015   Lab Results  Component Value Date   ALT 23 07/27/2015   AST 18 07/27/2015   ALKPHOS 70 07/27/2015   BILITOT 0.4 07/27/2015   Lab Results  Component Value Date   WBC 7.1 07/27/2015   HGB 13.3 07/27/2015   HCT 40.4 07/27/2015   MCV 90.7 07/27/2015   PLT 306.0 07/27/2015   Lab Results  Component Value Date   TSH 1.41 07/27/2015   Current Outpatient Prescriptions on File Prior to Visit  Medication Sig Dispense Refill  . ALPRAZolam (XANAX) 1 MG tablet Take 1 mg by mouth at bedtime as needed.      Marland Kitchen buPROPion (WELLBUTRIN XL) 150 MG 24 hr tablet Take 150 mg by mouth daily.      . Cholecalciferol (VITAMIN D-3) 5000 UNITS TABS Take 1 tablet by mouth daily.      . fish oil-omega-3 fatty acids 1000 MG capsule Take 1 g by mouth daily.      Marland Kitchen FLUoxetine (PROZAC)  20 MG tablet Take 20 mg by mouth daily.      . magnesium gluconate (MAGONATE) 500 MG tablet Take 1,000 mg by mouth at bedtime.     . montelukast (SINGULAIR) 10 MG tablet Take 10 mg by mouth at bedtime.    Marland Kitchen omeprazole (PRILOSEC) 20 MG capsule Take 20 mg by mouth daily.    Marland Kitchen triamcinolone (NASACORT ALLERGY 24HR) 55 MCG/ACT AERO nasal inhaler Place 2 sprays into the nose daily.    . valACYclovir (VALTREX) 1000 MG tablet Take 2 tablets (2,000 mg total) by mouth 2 (two) times daily. For 1 day 12 tablet 1  . zolpidem (AMBIEN CR) 12.5 MG CR tablet Take 12.5 mg by mouth at bedtime as needed.      . simvastatin (ZOCOR) 20 MG tablet Take 1 tablet (20 mg total) by mouth at bedtime. (Patient not taking: Reported on 08/25/2015) 90 tablet 1   No current facility-administered medications on file prior to visit.    Allergies  Allergen Reactions  . Prednisone     REACTION: Swelling  . Sulfonamide Derivatives     REACTION: Hives    No past medical history on file.  No past surgical history on file.  No  family history on file.  Social History   Social History  . Marital Status: Divorced    Spouse Name: N/A  . Number of Children: N/A  . Years of Education: N/A   Occupational History  . Not on file.   Social History Main Topics  . Smoking status: Never Smoker   . Smokeless tobacco: Never Used  . Alcohol Use: No  . Drug Use: No  . Sexual Activity: Not on file   Other Topics Concern  . Not on file   Social History Narrative   The PMH, PSH, Social History, Family History, Medications, and allergies have been reviewed in [facility], and have been updated if relevant.  Review of Systems  Constitutional: Positive for fatigue. Negative for fever and unexpected weight change.  HENT: Negative.   Eyes: Negative.   Respiratory: Negative.   Cardiovascular: Negative.   Gastrointestinal: Negative.   Endocrine: Positive for heat intolerance. Negative for cold intolerance, polydipsia, polyphagia and  polyuria.  Musculoskeletal: Negative.   Skin: Negative.   Allergic/Immunologic: Negative.   Neurological: Negative.   Hematological: Negative.   Psychiatric/Behavioral: Positive for sleep disturbance. Negative for suicidal ideas, hallucinations, behavioral problems, confusion, self-injury, dysphoric mood, decreased concentration and agitation. The patient is not nervous/anxious and is not hyperactive.   All other systems reviewed and are negative.      Objective:    BP 132/76 mmHg  Pulse 94  Temp(Src) 98.1 F (36.7 C) (Oral)  Ht 5' 2.75" (1.594 m)  Wt 191 lb 12 oz (86.977 kg)  BMI 34.23 kg/m2  SpO2 98%   Physical Exam   General:  Well-developed,well-nourished,in no acute distress; alert,appropriate and cooperative throughout examination Head:  normocephalic and atraumatic.   Eyes:  vision grossly intact, pupils equal, pupils round, and pupils reactive to light.   Ears:  R ear normal and L ear normal.   Nose:  no external deformity.   Mouth:  good dentition.   Neck:  No deformities, masses, or tenderness noted. Breasts:  No mass, nodules, thickening, tenderness, bulging, retraction, inflamation, nipple discharge or skin changes noted.   Lungs:  Normal respiratory effort, chest expands symmetrically. Lungs are clear to auscultation, no crackles or wheezes. Heart:  Normal rate and regular rhythm. S1 and S2 normal without gallop, murmur, click, rub or other extra sounds. Abdomen:  Bowel sounds positive,abdomen soft and non-tender without masses, organomegaly or hernias noted. Msk:  No deformity or scoliosis noted of thoracic or lumbar spine.   Extremities:  No clubbing, cyanosis, edema, or deformity noted with normal full range of motion of all joints.   Neurologic:  alert & oriented X3 and gait normal.   Skin:  Intact without suspicious lesions or rashes Cervical Nodes:  No lymphadenopathy noted Axillary Nodes:  No palpable lymphadenopathy Psych:  Cognition and judgment appear  intact. Alert and cooperative with normal attention span and concentration. No apparent delusions, illusions, hallucinations      Assessment & Plan:   Well woman exam  Depression  HLD (hyperlipidemia)  Vitamin D deficiency  Insomnia  Need for influenza vaccination - Plan: Flu Vaccine QUAD 36+ mos PF IM (Fluarix & Fluzone Quad PF)  Hot flashes, menopausal No Follow-up on file.

## 2015-08-25 NOTE — Assessment & Plan Note (Addendum)
Reviewed preventive care protocols, scheduled due services, and updated immunizations Discussed nutrition, exercise, diet, and healthy lifestyle.  Influenza and Tdap vaccines given today.

## 2015-10-01 ENCOUNTER — Other Ambulatory Visit: Payer: Self-pay | Admitting: Family Medicine

## 2015-10-04 ENCOUNTER — Other Ambulatory Visit: Payer: Self-pay | Admitting: *Deleted

## 2015-10-04 NOTE — Telephone Encounter (Signed)
Received refill request electronically Last refill 07/15/14 #12/1 Last office visit 08/25/15 Is it okay to refill?

## 2015-11-05 ENCOUNTER — Telehealth: Payer: Self-pay | Admitting: Nurse Practitioner

## 2015-11-05 DIAGNOSIS — R059 Cough, unspecified: Secondary | ICD-10-CM

## 2015-11-05 DIAGNOSIS — R05 Cough: Secondary | ICD-10-CM

## 2015-11-05 MED ORDER — BENZONATATE 100 MG PO CAPS: 100.0000 mg | ORAL_CAPSULE | Freq: Three times a day (TID) | ORAL | Status: DC | PRN

## 2015-11-05 MED ORDER — AZITHROMYCIN 250 MG PO TABS: ORAL_TABLET | ORAL | Status: DC

## 2015-11-05 NOTE — Progress Notes (Signed)

## 2016-06-12 ENCOUNTER — Ambulatory Visit (INDEPENDENT_AMBULATORY_CARE_PROVIDER_SITE_OTHER): Payer: BLUE CROSS/BLUE SHIELD | Admitting: Primary Care

## 2016-06-12 ENCOUNTER — Encounter: Payer: Self-pay | Admitting: Primary Care

## 2016-06-12 VITALS — BP 130/74 | HR 96 | Temp 98.0°F | Ht 62.75 in | Wt 189.1 lb

## 2016-06-12 DIAGNOSIS — R350 Frequency of micturition: Secondary | ICD-10-CM | POA: Diagnosis not present

## 2016-06-12 LAB — POC URINALSYSI DIPSTICK (AUTOMATED)
Bilirubin, UA: NEGATIVE
Blood, UA: NEGATIVE
Glucose, UA: NEGATIVE
Ketones, UA: NEGATIVE
Leukocytes, UA: NEGATIVE
Nitrite, UA: NEGATIVE
Protein, UA: NEGATIVE
Spec Grav, UA: 1.025
Urobilinogen, UA: NEGATIVE
pH, UA: 6

## 2016-06-12 MED ORDER — NITROFURANTOIN MONOHYD MACRO 100 MG PO CAPS: 100.0000 mg | ORAL_CAPSULE | Freq: Two times a day (BID) | ORAL | 0 refills | Status: DC

## 2016-06-12 NOTE — Patient Instructions (Signed)
Start Macrobid antibiotic capsules. Take 1 capsule by mouth twice daily for 5 days.  Ensure you are staying hydrated with water.  Please call me Friday if no improvement.  It was a pleasure meeting you!

## 2016-06-12 NOTE — Progress Notes (Signed)
Subjective:    Patient ID: Cassandra Cox, female    DOB: [birth year], 55 y.o.   MRN: [id number]  HPI  Cassandra Cox is a 55 year old female who presents today with a chief complaint of pelvic pressure. She also reports lower back pain, urinary frequency, urgency, and right groin pain. Her symptoms have been present since Friday evening last week.  Denies vaginal itching, hematuria, and vaginal discharge. She ran a low grade fever last night. She took some Macrobid Friday last week from an old prescription and Advil with temporary improvement. She's taken three total tablets of Macrobic.   Review of Systems  Constitutional: Positive for fatigue and fever.  Genitourinary: Positive for frequency, pelvic pain and urgency. Negative for dysuria, flank pain, hematuria and vaginal discharge.  Musculoskeletal: Positive for back pain.       No past medical history on file.   Social History   Social History  . Marital status: Divorced    Spouse name: N/A  . Number of children: N/A  . Years of education: N/A   Occupational History  . Not on file.   Social History Main Topics  . Smoking status: Never Smoker  . Smokeless tobacco: Never Used  . Alcohol use No  . Drug use: No  . Sexual activity: Not on file   Other Topics Concern  . Not on file   Social History Narrative  . No narrative on file    No past surgical history on file.  No family history on file.  Allergies  Allergen Reactions  . Prednisone     REACTION: Swelling  . Sulfonamide Derivatives     REACTION: Hives    Current Outpatient Prescriptions on File Prior to Visit  Medication Sig Dispense Refill  . ALPRAZolam (XANAX) 1 MG tablet Take 1 mg by mouth at bedtime as needed.      Marland Kitchen buPROPion (WELLBUTRIN XL) 150 MG 24 hr tablet Take 150 mg by mouth daily.      . Cholecalciferol (VITAMIN D-3) 5000 UNITS TABS Take 1 tablet by mouth daily.      . fish oil-omega-3 fatty acids 1000 MG capsule Take 1 g by mouth daily.       Marland Kitchen FLUoxetine (PROZAC) 20 MG tablet Take 20 mg by mouth daily.      . magnesium gluconate (MAGONATE) 500 MG tablet Take 1,000 mg by mouth at bedtime.     . montelukast (SINGULAIR) 10 MG tablet Take 10 mg by mouth at bedtime.    Marland Kitchen omeprazole (PRILOSEC) 20 MG capsule Take 20 mg by mouth daily.    . simvastatin (ZOCOR) 20 MG tablet Take 1 tablet (20 mg total) by mouth at bedtime. 90 tablet 3  . triamcinolone (NASACORT ALLERGY 24HR) 55 MCG/ACT AERO nasal inhaler Place 2 sprays into the nose daily.    . valACYclovir (VALTREX) 1000 MG tablet take 2 tablets by mouth twice a day for ONE DAY 12 tablet 1  . zolpidem (AMBIEN CR) 12.5 MG CR tablet Take 12.5 mg by mouth at bedtime as needed.       No current facility-administered medications on file prior to visit.     BP 130/74   Pulse 96   Temp 98 F (36.7 C) (Oral)   Ht 5' 2.75" (1.594 m)   Wt 189 lb 1.9 oz (85.8 kg)   SpO2 98%   BMI 33.77 kg/m    Objective:   Physical Exam  Constitutional: She appears well-nourished.  Cardiovascular:  Normal rate and regular rhythm.   Pulmonary/Chest: Effort normal and breath sounds normal.  Abdominal: Soft. Bowel sounds are normal. There is tenderness in the suprapubic area. There is no CVA tenderness.  Skin: Skin is warm and dry.          Assessment & Plan:  Urinary Frequency:  Also with pelvic pressure, urgency, low grade fever. Some improvement after taking Macrobid for 3 days from an old prescription. Exam without CVA tenderness, does not appear acutely ill, suprapubic tenderness. UA: Negative. Given improvement in symptoms with Macrobid, will extend antibiotic for now for an additional 5 days. Suspect her consumption of Macrobid may have skewed the results of UA.  She will update me later this week if no improvement.   Sheral Flow, NP

## 2016-06-12 NOTE — Progress Notes (Signed)
Pre visit review using our clinic review tool, if applicable. No additional management support is needed unless otherwise documented below in the visit note. 

## 2016-06-12 NOTE — Addendum Note (Signed)
Addended by: Jacqualin Combes on: 06/12/2016 05:11 PM   Modules accepted: Orders

## 2016-06-19 ENCOUNTER — Telehealth: Payer: Self-pay | Admitting: Primary Care

## 2016-06-19 NOTE — Telephone Encounter (Signed)
Patient needs reevaluation as symptoms could be due to other causes. Please schedule with her PCP.

## 2016-06-19 NOTE — Telephone Encounter (Signed)
Patient was seen for a UTI last week.  Patient finished her antibiotics.  Patient is still having pain,chills,sweating,pain in lower region,urgency,back pain.  Patient uses [facility].

## 2016-06-20 NOTE — Telephone Encounter (Signed)
Spoken and notified patient of Kate's comments. Patient verbalized understanding.  Patient will call back and schedule appt with Dr Deborra Medina

## 2016-07-03 ENCOUNTER — Other Ambulatory Visit: Payer: Self-pay | Admitting: Family Medicine

## 2016-08-06 DIAGNOSIS — N200 Calculus of kidney: Secondary | ICD-10-CM

## 2016-08-06 HISTORY — DX: Calculus of kidney: N20.0

## 2016-08-28 ENCOUNTER — Emergency Department (HOSPITAL_COMMUNITY): Payer: BLUE CROSS/BLUE SHIELD

## 2016-08-28 ENCOUNTER — Encounter (HOSPITAL_COMMUNITY): Payer: Self-pay | Admitting: Emergency Medicine

## 2016-08-28 ENCOUNTER — Emergency Department (HOSPITAL_COMMUNITY)
Admission: EM | Admit: 2016-08-28 | Discharge: 2016-08-28 | Disposition: A | Discharge status: Home / Self Care | Payer: BLUE CROSS/BLUE SHIELD | Attending: Emergency Medicine | Admitting: Emergency Medicine

## 2016-08-28 DIAGNOSIS — N202 Calculus of kidney with calculus of ureter: Secondary | ICD-10-CM | POA: Diagnosis not present

## 2016-08-28 DIAGNOSIS — R109 Unspecified abdominal pain: Secondary | ICD-10-CM | POA: Diagnosis present

## 2016-08-28 DIAGNOSIS — N200 Calculus of kidney: Secondary | ICD-10-CM

## 2016-08-28 LAB — URINALYSIS, ROUTINE W REFLEX MICROSCOPIC
Bilirubin Urine: NEGATIVE
Glucose, UA: NEGATIVE mg/dL
Ketones, ur: 15 mg/dL — AB
Nitrite: NEGATIVE
Protein, ur: NEGATIVE mg/dL
Specific Gravity, Urine: 1.017 (ref 1.005–1.030)
pH: 7 (ref 5.0–8.0)

## 2016-08-28 LAB — COMPREHENSIVE METABOLIC PANEL
ALT: 22 U/L (ref 14–54)
AST: 26 U/L (ref 15–41)
Albumin: 4.3 g/dL (ref 3.5–5.0)
Alkaline Phosphatase: 68 U/L (ref 38–126)
Anion gap: 16 — ABNORMAL HIGH (ref 5–15)
BUN: 15 mg/dL (ref 6–20)
CO2: 19 mmol/L — ABNORMAL LOW (ref 22–32)
Calcium: 9.7 mg/dL (ref 8.9–10.3)
Chloride: 103 mmol/L (ref 101–111)
Creatinine, Ser: 0.75 mg/dL (ref 0.44–1.00)
GFR calc Af Amer: >60 mL/min (ref >60)
GFR calc non Af Amer: >60 mL/min (ref >60)
Glucose, Bld: 127 mg/dL — ABNORMAL HIGH (ref 65–99)
Potassium: 4 mmol/L (ref 3.5–5.1)
Sodium: 138 mmol/L (ref 135–145)
Total Bilirubin: 0.5 mg/dL (ref 0.3–1.2)
Total Protein: 8 g/dL (ref 6.5–8.1)

## 2016-08-28 LAB — LIPASE, BLOOD: Lipase: 27 U/L (ref 11–51)

## 2016-08-28 LAB — URINE MICROSCOPIC-ADD ON

## 2016-08-28 LAB — CBC
HCT: 40.8 % (ref 36.0–46.0)
Hemoglobin: 13.5 g/dL (ref 12.0–15.0)
MCH: 30.2 pg (ref 26.0–34.0)
MCHC: 33.1 g/dL (ref 30.0–36.0)
MCV: 91.3 fL (ref 78.0–100.0)
Platelets: 347 10*3/uL (ref 150–400)
RBC: 4.47 MIL/uL (ref 3.87–5.11)
RDW: 14.6 % (ref 11.5–15.5)
WBC: 12 10*3/uL — ABNORMAL HIGH (ref 4.0–10.5)

## 2016-08-28 LAB — I-STAT BETA HCG BLOOD, ED (MC, WL, AP ONLY): I-stat hCG, quantitative: <5 m[IU]/mL (ref <5)

## 2016-08-28 LAB — I-STAT TROPONIN, ED: Troponin i, poc: 0 ng/mL (ref 0.00–0.08)

## 2016-08-28 MED ORDER — ONDANSETRON HCL 4 MG PO TABS: 4.0000 mg | ORAL_TABLET | Freq: Three times a day (TID) | ORAL | 0 refills | Status: DC | PRN

## 2016-08-28 MED ORDER — ONDANSETRON 4 MG PO TBDP
ORAL_TABLET | ORAL | Status: AC
  Filled 2016-08-28: qty 1

## 2016-08-28 MED ORDER — HYDROCODONE-ACETAMINOPHEN 5-325 MG PO TABS: 1.0000 | ORAL_TABLET | Freq: Four times a day (QID) | ORAL | 0 refills | Status: DC | PRN

## 2016-08-28 MED ORDER — ONDANSETRON 4 MG PO TBDP
4.0000 mg | ORAL_TABLET | Freq: Once | ORAL | Status: AC | PRN
Start: 2016-08-28 — End: 2016-08-28
  Administered 2016-08-28: 4 mg via ORAL

## 2016-08-28 MED ORDER — TAMSULOSIN HCL 0.4 MG PO CAPS: 0.4000 mg | ORAL_CAPSULE | Freq: Every day | ORAL | 0 refills | Status: AC

## 2016-08-28 NOTE — ED Triage Notes (Signed)
Pt states she had a sudden onset of RLQ pain this morning with n/v.

## 2016-08-28 NOTE — ED Notes (Signed)
Dr. Delo at bedside. 

## 2016-08-28 NOTE — ED Notes (Signed)
While wheeling pt to waiting area pt stated she is having pain in the center of her chest. EKG done and added I stat trop.

## 2016-08-28 NOTE — ED Notes (Signed)
Pt unable to void at this time. 

## 2016-08-28 NOTE — ED Provider Notes (Signed)
[facility] DEPT Provider Note   CSN: [id number] Arrival date & time: 08/28/16  1149     History   Chief Complaint Chief Complaint  Patient presents with  . Abdominal Pain    HPI Cassandra Cox is a 55 y.o. female that presents to the ED noting the onset of sharp stabbing right flank pain radiating around her abdomen this morning. It has been severe, waxing and waning unpredictably, but worse with movement  And changes in position. She has had associated nausea and non-bloody vomiting, urinary urgency, and darkening of her urine, but no diarrhea, blood in her stool, dysuria, fever, chills. She was feeling fine before the onset with no preceding illness. No other significant PMH or PSH. Just prior to arrival she had an episode of vomiting, which caused a transient burning sensation in her central chest. She states that she received a dose of zofran while awaiting a room and this significantly improved her pain and nausea. She declined pain medication at this time, awaiting CT scan for definitive diagnosis.  HPI  History reviewed. No pertinent past medical history.  Patient Active Problem List   Diagnosis Date Noted  . Insomnia 08/25/2015  . Hot flashes, menopausal 08/25/2015  . Well woman exam 07/22/2015  . Family history of diabetes mellitus 07/15/2014  . Vitamin D deficiency 11/03/2009  . HLD (hyperlipidemia) 11/03/2009  . ANXIETY 11/03/2009  . Depression 11/03/2009    History reviewed. No pertinent surgical history.  OB History    No data available       Home Medications    Prior to Admission medications   Medication Sig Start Date End Date Taking? Authorizing Provider  ALPRAZolam Duanne Moron) 1 MG tablet Take 1 mg by mouth at bedtime as needed.      Historical Provider, MD  buPROPion (WELLBUTRIN XL) 150 MG 24 hr tablet Take 150 mg by mouth daily.      Historical Provider, MD  Cholecalciferol (VITAMIN D-3) 5000 UNITS TABS Take 1 tablet by mouth daily.       Historical Provider, MD  fish oil-omega-3 fatty acids 1000 MG capsule Take 1 g by mouth daily.      Historical Provider, MD  FLUoxetine (PROZAC) 20 MG tablet Take 20 mg by mouth daily.      Historical Provider, MD  HYDROcodone-acetaminophen (NORCO/VICODIN) 5-325 MG tablet Take 1-2 tablets by mouth every 6 (six) hours as needed for severe pain. 08/28/16   Zenovia Jarred, DO  magnesium gluconate (MAGONATE) 500 MG tablet Take 1,000 mg by mouth at bedtime.     Historical Provider, MD  montelukast (SINGULAIR) 10 MG tablet Take 10 mg by mouth at bedtime.    Historical Provider, MD  nitrofurantoin, macrocrystal-monohydrate, (MACROBID) 100 MG capsule Take 1 capsule (100 mg total) by mouth 2 (two) times daily. 06/12/16   Pleas Koch, NP  omeprazole (PRILOSEC) 20 MG capsule Take 20 mg by mouth daily.    Historical Provider, MD  ondansetron (ZOFRAN) 4 MG tablet Take 1 tablet (4 mg total) by mouth every 8 (eight) hours as needed for nausea or vomiting. 08/28/16   Zenovia Jarred, DO  simvastatin (ZOCOR) 20 MG tablet Take 1 tablet (20 mg total) by mouth at bedtime. COMPLETE PHYSICAL EXAM REQUIRED FOR ADDITIONAL REFILLS 07/03/16   Lucille Passy, MD  tamsulosin (FLOMAX) 0.4 MG CAPS capsule Take 1 capsule (0.4 mg total) by mouth daily. 08/28/16 09/04/16  Zenovia Jarred, DO  triamcinolone (NASACORT ALLERGY 24HR) 55 MCG/ACT AERO nasal  inhaler Place 2 sprays into the nose daily.    Historical Provider, MD  valACYclovir (VALTREX) 1000 MG tablet take 2 tablets by mouth twice a day for ONE DAY 10/04/15   Lucille Passy, MD  zolpidem (AMBIEN CR) 12.5 MG CR tablet Take 12.5 mg by mouth at bedtime as needed.      Historical Provider, MD    Family History No family history on file.  Social History Social History  Substance Use Topics  . Smoking status: Never Smoker  . Smokeless tobacco: Never Used  . Alcohol use No     Allergies   Prednisone and Sulfonamide derivatives   Review of Systems Review of Systems    Constitutional: Positive for activity change and appetite change. Negative for chills and fever.  Respiratory: Negative for cough, chest tightness and shortness of breath.   Gastrointestinal: Positive for abdominal pain, nausea and vomiting. Negative for blood in stool and diarrhea.  Genitourinary: Positive for flank pain, hematuria and urgency. Negative for dysuria, frequency, vaginal bleeding, vaginal discharge and vaginal pain.  Musculoskeletal: Negative for back pain, myalgias and neck pain.  Skin: Negative for rash.  Neurological: Negative for syncope, light-headedness and headaches.  All other systems reviewed and are negative.    Physical Exam Updated Vital Signs BP 142/87 (BP Location: Right Arm)   Pulse 81   Temp 99.2 F (37.3 C) (Oral)   Resp 17   Ht 5' 3"  (1.6 m)   Wt 85.7 kg   SpO2 100%   BMI 33.48 kg/m   Physical Exam  Constitutional: She is oriented to person, place, and time. She appears well-developed and well-nourished. No distress.  HENT:  Head: Normocephalic and atraumatic.  Nose: Nose normal.  Mouth/Throat: Oropharynx is clear and moist.  Eyes: Conjunctivae and EOM are normal. Pupils are equal, round, and reactive to light.  Neck: Neck supple.  Cardiovascular: Normal rate, regular rhythm, normal heart sounds and intact distal pulses.   Pulmonary/Chest: Effort normal and breath sounds normal. She exhibits no tenderness.  Abdominal: Soft. She exhibits no distension. There is tenderness (right flank) in the right lower quadrant and suprapubic area. There is tenderness at McBurney's point. There is no rebound, no guarding, no CVA tenderness and negative Murphy's sign.  Musculoskeletal: She exhibits no tenderness.  Neurological: She is alert and oriented to person, place, and time. No cranial nerve deficit. Coordination normal.  Skin: Skin is warm and dry. No rash noted. She is not diaphoretic.  Nursing note and vitals reviewed.    ED Treatments / Results   Labs (all labs ordered are listed, but only abnormal results are displayed) Labs Reviewed  COMPREHENSIVE METABOLIC PANEL - Abnormal; Notable for the following:       Result Value   CO2 19 (*)    Glucose, Bld 127 (*)    Anion gap 16 (*)    All other components within normal limits  CBC - Abnormal; Notable for the following:    WBC 12.0 (*)    All other components within normal limits  URINALYSIS, ROUTINE W REFLEX MICROSCOPIC (NOT AT [facility]) - Abnormal; Notable for the following:    Color, Urine RED (*)    APPearance CLOUDY (*)    Hgb urine dipstick LARGE (*)    Ketones, ur 15 (*)    Leukocytes, UA SMALL (*)    All other components within normal limits  URINE MICROSCOPIC-ADD ON - Abnormal; Notable for the following:    Squamous Epithelial / LPF 0-5 (*)  Bacteria, UA RARE (*)    All other components within normal limits  LIPASE, BLOOD  I-STAT BETA HCG BLOOD, ED ([facility], [facility], [facility] ONLY)  I-STAT TROPOININ, ED    EKG  EKG Interpretation  Date/Time:  Monday August 28 2016 12:38:16 EDT Ventricular Rate:  79 PR Interval:  92 QRS Duration: 86 QT Interval:  440 QTC Calculation: 504 R Axis:   71 Text Interpretation:  Sinus rhythm Low voltage QRS Cannot rule out Anterior infarct , age undetermined Abnormal ECG No significant change since 07/09/02 Confirmed by DELO  MD, DOUGLAS ([id number]) on 08/28/2016 3:38:23 PM       Radiology Ct Renal Stone Study  Result Date: 08/28/2016 CLINICAL DATA:  Sudden onset right lower quadrant pain this morning with nausea and vomiting. Hematuria. EXAM: CT ABDOMEN AND PELVIS WITHOUT CONTRAST TECHNIQUE: Multidetector CT imaging of the abdomen and pelvis was performed following the standard protocol without IV contrast. COMPARISON:  06/20/2006 FINDINGS: Lower chest: Visualized lung bases are clear. Hepatobiliary: Decreased attenuation of the liver is consistent with steatosis. No focal liver abnormality is identified on this unenhanced study. Gallbladder is  unremarkable. No biliary dilatation. Pancreas: 3 mm calcification in the region of the pancreatic head without evidence of ductal dilatation, gross mass, or pancreatic atrophy. Spleen: Unremarkable. Adrenals/Urinary Tract: Unremarkable adrenal glands. Irregular 5 mm calculus (or possibly 2 adjacent 2-3 mm calculi) in the proximal right ureter with resultant moderate hydronephrosis and perinephric stranding. Punctate calculus in the right lower pole. Two adjacent nonobstructing calculi in the upper pole of the left kidney, with the larger measuring 2 mm. Unremarkable bladder. Stomach/Bowel: Stomach is within normal limits. There is no evidence of bowel obstruction or wall thickening. Mild left-sided colonic diverticulosis is noted without evidence of diverticulitis. The appendix is unremarkable. Vascular/Lymphatic: Abdominal aortic atherosclerosis without aneurysm. Incidental retroaortic left renal vein. No enlarged lymph nodes are identified. Reproductive: Status post hysterectomy. No adnexal masses. Other: No intraperitoneal free fluid. No abdominal wall mass or hernia. Musculoskeletal: No acute osseous abnormality or suspicious lytic or blastic osseous lesion. IMPRESSION: 1. 5 mm obstructing proximal right ureteral calculus with moderate hydronephrosis. 2. Punctate nonobstructing renal calculi bilaterally. 3. Hepatic steatosis. 4. Aortic atherosclerosis. 5. Colonic diverticulosis. Electronically Signed   By: Logan Bores M.D.   On: 08/28/2016 17:21    Procedures Procedures (including critical care time)  Medications Ordered in ED Medications  ondansetron (ZOFRAN-ODT) disintegrating tablet 4 mg (4 mg Oral Given 08/28/16 1220)     Initial Impression / Assessment and Plan / ED Course  I have reviewed the triage vital signs and the nursing notes.  Pertinent labs & imaging results that were available during my care of the patient were reviewed by me and considered in my medical decision making (see chart  for details).  Clinical Course  55 y.o. female presents with right flank pain concerning for kidney stone vs appendicitis. Low suspicion for ovarian torsion given waxing and waning exam, or cholecystitis given location of pain and exam. Labs were drawn and returned showing leukocytosis of 12.0, bicarb 19, Ag 16, likely due to decreased PO intake, vomiting, but otherwise reassuring. UA showed hematuria with small leuks, negative nitrites.   CT stone study was done and showed a 36m obstructing stone with moderate hydro. She was rx'd norco, flomax, and zofran for further symptomatic relief and was given urine strainers. She was recommended to follow up closely with urology for further evaluation of her symptoms as an outpatient. This plan was discussed with the  patient and her family at the bedside and they stated both understanding and agreement.    Final Clinical Impressions(s) / ED Diagnoses   Final diagnoses:  Kidney stone on right side    New Prescriptions Discharge Medication List as of 08/28/2016  5:47 PM    START taking these medications   Details  HYDROcodone-acetaminophen (NORCO/VICODIN) 5-325 MG tablet Take 1-2 tablets by mouth every 6 (six) hours as needed for severe pain., Starting Mon 08/28/2016, Print    ondansetron (ZOFRAN) 4 MG tablet Take 1 tablet (4 mg total) by mouth every 8 (eight) hours as needed for nausea or vomiting., Starting Mon 08/28/2016, Print    tamsulosin (FLOMAX) 0.4 MG CAPS capsule Take 1 capsule (0.4 mg total) by mouth daily., Starting Mon 08/28/2016, Until Mon 09/04/2016, [place], DO 08/29/16 1130    Veryl Speak, MD 08/29/16 2315

## 2016-12-02 ENCOUNTER — Encounter
Admission: EM | Disposition: A | Discharge status: Home / Self Care | Payer: Self-pay | Source: Home / Self Care | Attending: Internal Medicine

## 2016-12-02 ENCOUNTER — Emergency Department: Payer: BLUE CROSS/BLUE SHIELD

## 2016-12-02 ENCOUNTER — Encounter: Payer: Self-pay | Admitting: Emergency Medicine

## 2016-12-02 ENCOUNTER — Inpatient Hospital Stay
Admission: EM | Admit: 2016-12-02 | Discharge: 2016-12-04 | DRG: 247 | Disposition: A | Discharge status: Home / Self Care | Payer: BLUE CROSS/BLUE SHIELD | Attending: Internal Medicine | Admitting: Internal Medicine

## 2016-12-02 DIAGNOSIS — I2102 ST elevation (STEMI) myocardial infarction involving left anterior descending coronary artery: Secondary | ICD-10-CM

## 2016-12-02 DIAGNOSIS — Z79899 Other long term (current) drug therapy: Secondary | ICD-10-CM

## 2016-12-02 DIAGNOSIS — I213 ST elevation (STEMI) myocardial infarction of unspecified site: Secondary | ICD-10-CM | POA: Diagnosis present

## 2016-12-02 DIAGNOSIS — Z23 Encounter for immunization: Secondary | ICD-10-CM | POA: Diagnosis not present

## 2016-12-02 DIAGNOSIS — I472 Ventricular tachycardia: Secondary | ICD-10-CM | POA: Diagnosis present

## 2016-12-02 DIAGNOSIS — Z79891 Long term (current) use of opiate analgesic: Secondary | ICD-10-CM | POA: Diagnosis not present

## 2016-12-02 DIAGNOSIS — F329 Major depressive disorder, single episode, unspecified: Secondary | ICD-10-CM | POA: Diagnosis present

## 2016-12-02 DIAGNOSIS — Z882 Allergy status to sulfonamides status: Secondary | ICD-10-CM

## 2016-12-02 DIAGNOSIS — I259 Chronic ischemic heart disease, unspecified: Secondary | ICD-10-CM

## 2016-12-02 DIAGNOSIS — Z792 Long term (current) use of antibiotics: Secondary | ICD-10-CM | POA: Diagnosis not present

## 2016-12-02 DIAGNOSIS — E782 Mixed hyperlipidemia: Secondary | ICD-10-CM | POA: Diagnosis present

## 2016-12-02 DIAGNOSIS — I2510 Atherosclerotic heart disease of native coronary artery without angina pectoris: Secondary | ICD-10-CM

## 2016-12-02 DIAGNOSIS — F419 Anxiety disorder, unspecified: Secondary | ICD-10-CM

## 2016-12-02 DIAGNOSIS — I25110 Atherosclerotic heart disease of native coronary artery with unstable angina pectoris: Secondary | ICD-10-CM | POA: Diagnosis present

## 2016-12-02 DIAGNOSIS — K219 Gastro-esophageal reflux disease without esophagitis: Secondary | ICD-10-CM | POA: Diagnosis present

## 2016-12-02 DIAGNOSIS — I2109 ST elevation (STEMI) myocardial infarction involving other coronary artery of anterior wall: Principal | ICD-10-CM | POA: Diagnosis present

## 2016-12-02 DIAGNOSIS — Z888 Allergy status to other drugs, medicaments and biological substances status: Secondary | ICD-10-CM

## 2016-12-02 DIAGNOSIS — R079 Chest pain, unspecified: Secondary | ICD-10-CM | POA: Diagnosis present

## 2016-12-02 DIAGNOSIS — E785 Hyperlipidemia, unspecified: Secondary | ICD-10-CM | POA: Diagnosis present

## 2016-12-02 DIAGNOSIS — Z8249 Family history of ischemic heart disease and other diseases of the circulatory system: Secondary | ICD-10-CM

## 2016-12-02 HISTORY — DX: Hyperlipidemia, unspecified: E78.5

## 2016-12-02 HISTORY — DX: Depression, unspecified: F32.A

## 2016-12-02 HISTORY — DX: ST elevation (STEMI) myocardial infarction of unspecified site: I21.3

## 2016-12-02 HISTORY — DX: Major depressive disorder, single episode, unspecified: F32.9

## 2016-12-02 HISTORY — PX: CARDIAC CATHETERIZATION: SHX172

## 2016-12-02 LAB — TROPONIN I
Troponin I: <0.03 ng/mL (ref <0.03)
Troponin I: 6.13 ng/mL (ref <0.03)

## 2016-12-02 LAB — DIFFERENTIAL
Basophils Absolute: 0.1 10*3/uL (ref 0–0.1)
Basophils Relative: 1 %
Eosinophils Absolute: 0.1 10*3/uL (ref 0–0.7)
Eosinophils Relative: 1 %
Lymphocytes Relative: 29 %
Lymphs Abs: 2.9 10*3/uL (ref 1.0–3.6)
Monocytes Absolute: 0.9 10*3/uL (ref 0.2–0.9)
Monocytes Relative: 9 %
Neutro Abs: 6.1 10*3/uL (ref 1.4–6.5)
Neutrophils Relative %: 60 %

## 2016-12-02 LAB — COMPREHENSIVE METABOLIC PANEL
ALT: 25 U/L (ref 14–54)
AST: 32 U/L (ref 15–41)
Albumin: 4.5 g/dL (ref 3.5–5.0)
Alkaline Phosphatase: 71 U/L (ref 38–126)
Anion gap: 15 (ref 5–15)
BUN: 16 mg/dL (ref 6–20)
CO2: 22 mmol/L (ref 22–32)
Calcium: 9.6 mg/dL (ref 8.9–10.3)
Chloride: 99 mmol/L — ABNORMAL LOW (ref 101–111)
Creatinine, Ser: 0.69 mg/dL (ref 0.44–1.00)
GFR calc Af Amer: >60 mL/min (ref >60)
GFR calc non Af Amer: >60 mL/min (ref >60)
Glucose, Bld: 166 mg/dL — ABNORMAL HIGH (ref 65–99)
Potassium: 3.6 mmol/L (ref 3.5–5.1)
Sodium: 136 mmol/L (ref 135–145)
Total Bilirubin: 0.7 mg/dL (ref 0.3–1.2)
Total Protein: 8.1 g/dL (ref 6.5–8.1)

## 2016-12-02 LAB — CBC
HCT: 41.2 % (ref 35.0–47.0)
Hemoglobin: 13.6 g/dL (ref 12.0–16.0)
MCH: 30.1 pg (ref 26.0–34.0)
MCHC: 33.1 g/dL (ref 32.0–36.0)
MCV: 90.9 fL (ref 80.0–100.0)
Platelets: 400 10*3/uL (ref 150–440)
RBC: 4.53 MIL/uL (ref 3.80–5.20)
RDW: 13.8 % (ref 11.5–14.5)
WBC: 10 10*3/uL (ref 3.6–11.0)

## 2016-12-02 LAB — APTT: aPTT: 26 seconds (ref 24–36)

## 2016-12-02 LAB — MRSA PCR SCREENING: MRSA by PCR: NEGATIVE

## 2016-12-02 LAB — LIPID PANEL
Cholesterol: 325 mg/dL — ABNORMAL HIGH (ref 0–200)
HDL: 61 mg/dL (ref >40)
LDL Cholesterol: 206 mg/dL — ABNORMAL HIGH (ref 0–99)
Total CHOL/HDL Ratio: 5.3 RATIO
Triglycerides: 292 mg/dL — ABNORMAL HIGH (ref <150)
VLDL: 58 mg/dL — ABNORMAL HIGH (ref 0–40)

## 2016-12-02 LAB — MAGNESIUM: Magnesium: 2 mg/dL (ref 1.7–2.4)

## 2016-12-02 LAB — PROTIME-INR
INR: 0.92
Prothrombin Time: 12.4 seconds (ref 11.4–15.2)

## 2016-12-02 SURGERY — CORONARY STENT INTERVENTION
Anesthesia: Moderate Sedation

## 2016-12-02 MED ORDER — TICAGRELOR 90 MG PO TABS
90.0000 mg | ORAL_TABLET | Freq: Two times a day (BID) | ORAL | Status: DC
  Administered 2016-12-03 – 2016-12-04 (×3): 90 mg via ORAL
  Filled 2016-12-02 (×3): qty 1

## 2016-12-02 MED ORDER — NITROGLYCERIN 0.4 MG SL SUBL
0.4000 mg | SUBLINGUAL_TABLET | SUBLINGUAL | Status: DC | PRN
  Administered 2016-12-02: 0.4 mg via SUBLINGUAL

## 2016-12-02 MED ORDER — ACETAMINOPHEN 325 MG PO TABS
650.0000 mg | ORAL_TABLET | ORAL | Status: DC | PRN
  Administered 2016-12-04: 650 mg via ORAL
  Filled 2016-12-02: qty 2

## 2016-12-02 MED ORDER — TICAGRELOR 90 MG PO TABS
ORAL_TABLET | ORAL | Status: DC | PRN
Start: 2016-12-02 — End: 2016-12-02
  Administered 2016-12-02: 180 mg via ORAL

## 2016-12-02 MED ORDER — ASPIRIN 81 MG PO CHEW: 324.0000 mg | CHEWABLE_TABLET | Freq: Once | ORAL | Status: DC

## 2016-12-02 MED ORDER — NITROGLYCERIN 1 MG/10 ML FOR IR/CATH LAB
INTRA_ARTERIAL | Status: DC | PRN
  Administered 2016-12-02: 200 ug via INTRACORONARY
  Administered 2016-12-02 (×2): 100 ug via INTRACORONARY

## 2016-12-02 MED ORDER — ONDANSETRON HCL 4 MG/2ML IJ SOLN
4.0000 mg | Freq: Once | INTRAMUSCULAR | Status: AC
  Administered 2016-12-02: 4 mg via INTRAVENOUS

## 2016-12-02 MED ORDER — HYDRALAZINE HCL 20 MG/ML IJ SOLN: 5.0000 mg | INTRAMUSCULAR | Status: DC | PRN

## 2016-12-02 MED ORDER — ONDANSETRON HCL 4 MG/2ML IJ SOLN: 4.0000 mg | Freq: Four times a day (QID) | INTRAMUSCULAR | Status: DC | PRN

## 2016-12-02 MED ORDER — FENTANYL CITRATE (PF) 100 MCG/2ML IJ SOLN
INTRAMUSCULAR | Status: AC
  Filled 2016-12-02: qty 2

## 2016-12-02 MED ORDER — ALPRAZOLAM 1 MG PO TABS
1.0000 mg | ORAL_TABLET | Freq: Every evening | ORAL | Status: DC | PRN
  Administered 2016-12-02: 1 mg via ORAL
  Filled 2016-12-02: qty 1

## 2016-12-02 MED ORDER — ATORVASTATIN CALCIUM 20 MG PO TABS
80.0000 mg | ORAL_TABLET | Freq: Every day | ORAL | Status: DC
  Administered 2016-12-02 – 2016-12-04 (×3): 80 mg via ORAL
  Filled 2016-12-02 (×3): qty 4

## 2016-12-02 MED ORDER — TIROFIBAN HCL IN NACL 5-0.9 MG/100ML-% IV SOLN
INTRAVENOUS | Status: AC
  Filled 2016-12-02: qty 100

## 2016-12-02 MED ORDER — HEPARIN SODIUM (PORCINE) 5000 UNIT/ML IJ SOLN: 60.0000 [IU]/kg | INTRAMUSCULAR | Status: DC

## 2016-12-02 MED ORDER — PANTOPRAZOLE SODIUM 40 MG PO TBEC
40.0000 mg | DELAYED_RELEASE_TABLET | Freq: Every day | ORAL | Status: DC
  Administered 2016-12-03 – 2016-12-04 (×2): 40 mg via ORAL
  Filled 2016-12-02 (×2): qty 1

## 2016-12-02 MED ORDER — MONTELUKAST SODIUM 10 MG PO TABS
10.0000 mg | ORAL_TABLET | Freq: Every day | ORAL | Status: DC
  Administered 2016-12-02 – 2016-12-03 (×2): 10 mg via ORAL
  Filled 2016-12-02 (×2): qty 1

## 2016-12-02 MED ORDER — HEPARIN SODIUM (PORCINE) 1000 UNIT/ML IJ SOLN
INTRAMUSCULAR | Status: DC | PRN
  Administered 2016-12-02: 5000 [IU] via INTRAVENOUS

## 2016-12-02 MED ORDER — SODIUM CHLORIDE 0.9 % IV SOLN: 250.0000 mL | INTRAVENOUS | Status: DC | PRN

## 2016-12-02 MED ORDER — IOHEXOL 350 MG/ML SOLN
INTRAVENOUS | Status: DC | PRN
  Administered 2016-12-02: 125 mL via INTRA_ARTERIAL

## 2016-12-02 MED ORDER — NITROGLYCERIN 5 MG/ML IV SOLN
INTRAVENOUS | Status: AC
  Filled 2016-12-02: qty 10

## 2016-12-02 MED ORDER — VERAPAMIL HCL 2.5 MG/ML IV SOLN
INTRAVENOUS | Status: AC
  Filled 2016-12-02: qty 2

## 2016-12-02 MED ORDER — ENOXAPARIN SODIUM 40 MG/0.4ML ~~LOC~~ SOLN
40.0000 mg | SUBCUTANEOUS | Status: DC
  Administered 2016-12-03 – 2016-12-04 (×2): 40 mg via SUBCUTANEOUS
  Filled 2016-12-02 (×2): qty 0.4

## 2016-12-02 MED ORDER — HEPARIN SODIUM (PORCINE) 1000 UNIT/ML IJ SOLN
INTRAMUSCULAR | Status: AC
  Filled 2016-12-02: qty 1

## 2016-12-02 MED ORDER — OMEGA-3-ACID ETHYL ESTERS 1 G PO CAPS
1.0000 g | ORAL_CAPSULE | Freq: Every day | ORAL | Status: DC
  Administered 2016-12-03 – 2016-12-04 (×2): 1 g via ORAL
  Filled 2016-12-02 (×3): qty 1

## 2016-12-02 MED ORDER — TIROFIBAN HCL IN NACL 5-0.9 MG/100ML-% IV SOLN
INTRAVENOUS | Status: DC | PRN
  Administered 2016-12-02: 0.15 ug/kg/min via INTRAVENOUS

## 2016-12-02 MED ORDER — MIDAZOLAM HCL 2 MG/2ML IJ SOLN
INTRAMUSCULAR | Status: AC
  Filled 2016-12-02: qty 2

## 2016-12-02 MED ORDER — FLUOXETINE HCL 20 MG PO CAPS
20.0000 mg | ORAL_CAPSULE | Freq: Every day | ORAL | Status: DC
  Administered 2016-12-03 – 2016-12-04 (×2): 20 mg via ORAL
  Filled 2016-12-02 (×2): qty 1

## 2016-12-02 MED ORDER — BUPROPION HCL ER (XL) 150 MG PO TB24
150.0000 mg | ORAL_TABLET | Freq: Every day | ORAL | Status: DC
  Administered 2016-12-03 – 2016-12-04 (×2): 150 mg via ORAL
  Filled 2016-12-02 (×2): qty 1

## 2016-12-02 MED ORDER — TIROFIBAN (AGGRASTAT) BOLUS VIA INFUSION
INTRAVENOUS | Status: DC | PRN
  Administered 2016-12-02: 2347.5 ug via INTRAVENOUS

## 2016-12-02 MED ORDER — SODIUM CHLORIDE 0.9% FLUSH
3.0000 mL | Freq: Two times a day (BID) | INTRAVENOUS | Status: DC
  Administered 2016-12-02 – 2016-12-04 (×4): 3 mL via INTRAVENOUS

## 2016-12-02 MED ORDER — POTASSIUM CHLORIDE CRYS ER 20 MEQ PO TBCR
40.0000 meq | EXTENDED_RELEASE_TABLET | Freq: Once | ORAL | Status: AC
  Administered 2016-12-02: 40 meq via ORAL
  Filled 2016-12-02: qty 2

## 2016-12-02 MED ORDER — ONDANSETRON HCL 4 MG/2ML IJ SOLN
INTRAMUSCULAR | Status: AC
  Administered 2016-12-02: 4 mg via INTRAVENOUS
  Filled 2016-12-02: qty 2

## 2016-12-02 MED ORDER — ASPIRIN 81 MG PO CHEW: 81.0000 mg | CHEWABLE_TABLET | Freq: Once | ORAL | Status: DC

## 2016-12-02 MED ORDER — METOPROLOL SUCCINATE ER 25 MG PO TB24
12.5000 mg | ORAL_TABLET | Freq: Every day | ORAL | Status: DC
  Administered 2016-12-02 – 2016-12-03 (×2): 12.5 mg via ORAL
  Filled 2016-12-02 (×2): qty 1

## 2016-12-02 MED ORDER — SODIUM CHLORIDE 0.9 % IV SOLN: 10.0000 mL/h | INTRAVENOUS | Status: DC

## 2016-12-02 MED ORDER — TICAGRELOR 90 MG PO TABS
ORAL_TABLET | ORAL | Status: AC
  Filled 2016-12-02: qty 2

## 2016-12-02 MED ORDER — SODIUM CHLORIDE 0.9 % IV SOLN
10.0000 mL/h | INTRAVENOUS | Status: DC
  Administered 2016-12-02: 20 mL/h via INTRAVENOUS

## 2016-12-02 MED ORDER — ASPIRIN 81 MG PO CHEW
81.0000 mg | CHEWABLE_TABLET | Freq: Every day | ORAL | Status: DC
  Administered 2016-12-03 – 2016-12-04 (×2): 81 mg via ORAL
  Filled 2016-12-02 (×2): qty 1

## 2016-12-02 MED ORDER — HEPARIN SODIUM (PORCINE) 5000 UNIT/ML IJ SOLN
4000.0000 [IU] | INTRAMUSCULAR | Status: AC
  Administered 2016-12-02: 4000 [IU] via INTRAVENOUS

## 2016-12-02 MED ORDER — ZOLPIDEM TARTRATE 5 MG PO TABS
5.0000 mg | ORAL_TABLET | Freq: Every evening | ORAL | Status: DC | PRN
  Administered 2016-12-03: 5 mg via ORAL
  Filled 2016-12-02: qty 1

## 2016-12-02 MED ORDER — LABETALOL HCL 5 MG/ML IV SOLN: 10.0000 mg | INTRAVENOUS | Status: DC | PRN

## 2016-12-02 MED ORDER — SODIUM CHLORIDE 0.9 % IV SOLN
INTRAVENOUS | Status: AC
  Administered 2016-12-02: 20:00:00 via INTRAVENOUS

## 2016-12-02 MED ORDER — MIDAZOLAM HCL 2 MG/2ML IJ SOLN
INTRAMUSCULAR | Status: DC | PRN
  Administered 2016-12-02: 0.5 mg via INTRAVENOUS

## 2016-12-02 MED ORDER — VITAMIN D 1000 UNITS PO TABS
1000.0000 [IU] | ORAL_TABLET | Freq: Every day | ORAL | Status: DC
  Administered 2016-12-03 – 2016-12-04 (×2): 1000 [IU] via ORAL
  Filled 2016-12-02 (×3): qty 1

## 2016-12-02 MED ORDER — VERAPAMIL HCL 2.5 MG/ML IV SOLN
INTRAVENOUS | Status: DC | PRN
  Administered 2016-12-02: 18:00:00 via INTRA_ARTERIAL

## 2016-12-02 MED ORDER — FENTANYL CITRATE (PF) 100 MCG/2ML IJ SOLN
INTRAMUSCULAR | Status: DC | PRN
  Administered 2016-12-02: 25 ug via INTRAVENOUS

## 2016-12-02 MED ORDER — SODIUM CHLORIDE 0.9% FLUSH: 3.0000 mL | INTRAVENOUS | Status: DC | PRN

## 2016-12-02 SURGICAL SUPPLY — 15 items
BALLN MINITREK RX 2.0X12 (BALLOONS) ×2
BALLN ~~LOC~~ TREK RX 3.75X12 (BALLOONS) ×2
BALLOON MINITREK RX 2.0X12 (BALLOONS) ×1 IMPLANT
BALLOON ~~LOC~~ TREK RX 3.75X12 (BALLOONS) ×1 IMPLANT
CATH EAGLE EYE PLAT IMAGING (CATHETERS) ×2 IMPLANT
CATH INFINITI JR4 5F (CATHETERS) ×2 IMPLANT
CATH VISTA GUIDE 6FR XB3 (CATHETERS) ×2 IMPLANT
DEVICE INFLAT 30 PLUS (MISCELLANEOUS) ×2 IMPLANT
DEVICE RAD TR BAND REGULAR (VASCULAR PRODUCTS) ×2 IMPLANT
GLIDESHEATH SLEND SS 6F .021 (SHEATH) ×2 IMPLANT
KIT MANI 3VAL PERCEP (MISCELLANEOUS) ×2 IMPLANT
PACK CARDIAC CATH (CUSTOM PROCEDURE TRAY) ×2 IMPLANT
STENT RESOLUTE INTEG 3.0X18 (Permanent Stent) ×2 IMPLANT
WIRE ROSEN-J .035X260CM (WIRE) ×2 IMPLANT
WIRE RUNTHROUGH .014X180CM (WIRE) ×2 IMPLANT

## 2016-12-02 NOTE — Progress Notes (Signed)
ekg performed and given to RN

## 2016-12-02 NOTE — Consult Note (Signed)
Cardiology Consultation Note    Patient ID: Cassandra Cox, MRN: [id number], DOB/AGE: 1961/05/07 56 y.o. Admit date: 12/02/2016   Date of Consult: 12/02/2016 Primary Physician: Arnette Norris, MD Primary Cardiologist: None  Chief Complaint: Chest pain Reason for Consultation: STEMI Requesting MD: Lenise Arena, MD  HPI: Cassandra Cox is a 56 y.o. female with history of anxiety and hyperlipidemia, who developed acute onset of chest pain around 4:30 PM.  She had felt well earlier in the day.  She described severe substernal chest pressure and pain radiating to the left arm and neck, accompanied by nausea and diaphoresis.  EMS was summoned and found the patient to have anterior ST-segment elevation.  She was given ASA 324 mg x 1 and SL NTG x 1 with improvement but not resolution of the chest pain.  In the ED, anterior STEMI was confirmed.  The patient was given heparin 4,000 units IV x 1 and transported to the cath lab for emergent LHC and possible PCI.  Catheterization showed diffuse plaquing of the proximal and mid LAD with thrombotic occlusion of the mid LAD.  This was successfully treated with a single drug-eluting stent.  The patient's chest pain has now resolved.  Past Medical History:  Diagnosis Date  . Depression   . Hyperlipemia \[id number]\  . Renal disorder       Surgical History:  Past Surgical History:  Procedure Laterality Date  . ABDOMINAL HYSTERECTOMY       Home Meds: Prior to Admission medications   Medication Sig Start Date [name] Date Taking? Authorizing Provider  ALPRAZolam Duanne Moron) 1 MG tablet Take 1 mg by mouth at bedtime as needed.     Yes Historical Provider, MD  buPROPion (WELLBUTRIN XL) 150 MG 24 hr tablet Take 150 mg by mouth daily.     Yes Historical Provider, MD  Cholecalciferol (VITAMIN D-3) 5000 UNITS TABS Take 1 tablet by mouth daily.     Yes Historical Provider, MD  fish oil-omega-3 fatty acids 1000 MG capsule Take 1 g by mouth daily.     Yes Historical  Provider, MD  FLUoxetine (PROZAC) 20 MG tablet Take 20 mg by mouth daily.     Yes Historical Provider, MD  magnesium gluconate (MAGONATE) 500 MG tablet Take 1,000 mg by mouth at bedtime.    Yes Historical Provider, MD  montelukast (SINGULAIR) 10 MG tablet Take 10 mg by mouth at bedtime.   Yes Historical Provider, MD  omeprazole (PRILOSEC) 20 MG capsule Take 20 mg by mouth daily.   Yes Historical Provider, MD  simvastatin (ZOCOR) 20 MG tablet Take 1 tablet (20 mg total) by mouth at bedtime. COMPLETE PHYSICAL EXAM REQUIRED FOR ADDITIONAL REFILLS 07/03/16  Yes Lucille Passy, MD  triamcinolone (NASACORT ALLERGY 24HR) 55 MCG/ACT AERO nasal inhaler Place 2 sprays into the nose daily.   Yes Historical Provider, MD  zolpidem (AMBIEN CR) 12.5 MG CR tablet Take 12.5 mg by mouth at bedtime as needed.     Yes Historical Provider, MD  HYDROcodone-acetaminophen (NORCO/VICODIN) 5-325 MG tablet Take 1-2 tablets by mouth every 6 (six) hours as needed for severe pain. Patient not taking: Reported on 12/02/2016 08/28/16   Zenovia Jarred, DO  nitrofurantoin, macrocrystal-monohydrate, (MACROBID) 100 MG capsule Take 1 capsule (100 mg total) by mouth 2 (two) times daily. Patient not taking: Reported on 12/02/2016 06/12/16   Pleas Koch, NP  ondansetron (ZOFRAN) 4 MG tablet Take 1 tablet (4 mg total) by mouth every 8 (eight) hours  as needed for nausea or vomiting. Patient not taking: Reported on 12/02/2016 08/28/16   Zenovia Jarred, DO  valACYclovir (VALTREX) 1000 MG tablet take 2 tablets by mouth twice a day for ONE DAY Patient not taking: Reported on 12/02/2016 10/04/15   Lucille Passy, MD    Inpatient Medications:  . aspirin  324 mg Oral Once  . aspirin  81 mg Oral Once  . aspirin  81 mg Oral Daily  . atorvastatin  80 mg Oral q1800  . [START ON 12/03/2016] buPROPion  150 mg Oral Daily  . [START ON 12/03/2016] enoxaparin (LOVENOX) injection  40 mg Subcutaneous Q24H  . [START ON 12/03/2016] fish oil-omega-3 fatty acids   1 g Oral Daily  . [START ON 12/03/2016] FLUoxetine  20 mg Oral Daily  . montelukast  10 mg Oral QHS  . [START ON 12/03/2016] pantoprazole  40 mg Oral Daily  . sodium chloride flush  3 mL Intravenous Q12H  . [START ON 12/03/2016] ticagrelor  90 mg Oral BID  . [START ON 12/03/2016] Vitamin D-3  1 tablet Oral Daily   . sodium chloride      Allergies:  Allergies  Allergen Reactions  . Prednisone     REACTION: Swelling  . Sulfonamide Derivatives     REACTION: Hives    Social History   Social History  . Marital status: Divorced    Spouse name: N/A  . Number of children: N/A  . Years of education: N/A   Occupational History  . Not on file.   Social History Main Topics  . Smoking status: Never Smoker  . Smokeless tobacco: Never Used  . Alcohol use No  . Drug use: No  . Sexual activity: Not on file   Other Topics Concern  . Not on file   Social History Narrative  . No narrative on file     Family History  Problem Relation Age of Onset  . Heart disease Mother      Review of Systems: A 12-system review of systems was performed and is negative except as noted in the HPI.  Labs:  Recent Labs  12/02/16 1723  TROPONINI <0.03   Lab Results  Component Value Date   WBC 10.0 12/02/2016   HGB 13.6 12/02/2016   HCT 41.2 12/02/2016   MCV 90.9 12/02/2016   PLT 400 12/02/2016    Recent Labs Lab 12/02/16 1723  NA 136  K 3.6  CL 99*  CO2 22  BUN 16  CREATININE 0.69  CALCIUM 9.6  PROT 8.1  BILITOT 0.7  ALKPHOS 71  ALT 25  AST 32  GLUCOSE 166*   Lab Results  Component Value Date   CHOL 325 (H) 12/02/2016   HDL 61 12/02/2016   LDLCALC 206 (H) 12/02/2016   TRIG 292 (H) 12/02/2016   No results found for: DDIMER  Radiology/Studies:  Dg Chest 1 View  Result Date: 12/02/2016 CLINICAL DATA:  STEMI EXAM: CHEST 1 VIEW COMPARISON:  None. FINDINGS: Cardiac shadow is at the upper limits of normal in size. Very mild vascular congestion is noted likely related to  the underlying cardiac event. The lungs are clear. No bony abnormality is seen. IMPRESSION: Mild vascular congestion. Electronically Signed   By: Inez Catalina M.D.   On: 12/02/2016 17:46    Wt Readings from Last 3 Encounters:  12/02/16 207 lb (93.9 kg)  08/28/16 189 lb (85.7 kg)  06/12/16 189 lb 1.9 oz (85.8 kg)    EKG (precath): NSR  with anterior ST segment elevation and reciprocal depressions.  Physical Exam: Blood pressure 137/89, pulse 82, resp. rate (!) 21, weight 207 lb (93.9 kg), SpO2 100 %. Body mass index is 36.67 kg/m. General: Obese woman in no acute distress. Head: Normocephalic, atraumatic, sclera non-icteric, no xanthomas, nares are without discharge.  Neck: Negative for carotid bruits. JVD not elevated. Lungs: Clear bilaterally to auscultation without wheezes, rales, or rhonchi. Breathing is unlabored. Heart: RRR with S1 S2. No murmurs, rubs, or gallops appreciated. Abdomen: Soft, non-tender, non-distended with normoactive bowel sounds. No hepatomegaly. No rebound/guarding. No obvious abdominal masses. Msk:  No deformities. Extremities: No clubbing or cyanosis. No edema.  Distal pedal pulses are 2+ and equal bilaterally.  TR band in place over right radial arteriotomy site. Neuro: Alert and oriented X 3. No facial asymmetry. No focal deficit. Moves all extremities spontaneously. Psych:  Responds to questions appropriately with a normal affect.    Assessment and Plan  56 y/o woman with history of hyperlipidemia and anxiety, admitted with anterior STEMI status post PCI to the mid LAD.  STEMI: Patient is now chest pain free.  Catheterization notable for mildly elevated LVEDP as well.  Admit to ICU on the hospitalist service.  Continue tirofiban infusion per pharmacy x 4 hours.  Dual antiplatelet therapy with aspirin and ticagrelor for at least 12 months.  Switch simvastatin to atorvastatin 80 mg daily, given significantly elevated LDL.  Monitor blood pressure;  initiate beta-blocker and ACE inhibitor as HR and blood pressure allow.  Obtain transthoracic echocardiogram to evaluate LV function.  Trend troponins until the have peaked, then stop.  Obtain hemoglobin A1c and TSH with morning labs.  Hyperlipidemia: Significant elevation of total cholesterol, LDL, and triglycerides noted.  Switch simvastatin to atorvastatin 80 mg daily.  Continue fish oil  Discuss lifestyle modification prior to discharge.  Anxiety:  Continue home medications; further adjustments per hospitalist service.  Verlan Friends Malan Werk MD 12/02/2016, 7:59 PM Pager: [phone]

## 2016-12-02 NOTE — Brief Op Note (Signed)
Brief Cardiac Catheterization Note (Full Report to Follow)  Date: 12/02/2016 Time: 7:07 PM  PATIENT:  Cassandra Cox  56 y.o. female  PRE-OPERATIVE DIAGNOSIS:  STEMI  POST-OPERATIVE DIAGNOSIS:  *Same  PROCEDURE:  Procedure(s): Coronary Stent Intervention (N/A)  SURGEON:  Surgeon(s) and Role:    * Nelva Bush, MD - Primary  FINDINGS: 1.  Acute plaque rupture and thrombotic occlusion of mid LAD. 2.  Diffuse atherosclerotic plaquing of proximal and mid LAD. 3.  Mild elevated left ventricular filling pressure. 4.  Successful IVUS-guided PCI to mid LAD with placement of Integrity Resolute 3.0 x 18 mm drug eluting (post-dilated with 3.75 mm Dustin balloon proximally).  RECOMMENDATIONS: 1.  Admit to ICU on hospitalist service. 2.  TR band protocol. 3.  Continue tirofiban infusion x 4 hours. 4.  Dual antiplatelet therapy with aspirin and ticagrelor for at least 12 months. 5.  Aggressive secondary prevention, including high-intensity statin therapy. 6.  Transthoracic echocardiogram tomorrow.  Nelva Bush, MD [facility] Pager: [phone]

## 2016-12-02 NOTE — H&P (Signed)
[facility] NAME: Cassandra Cox    MR#:  [id number]  DATE OF BIRTH:  06-02-1961  DATE OF ADMISSION:  12/02/2016  PRIMARY CARE PHYSICIAN: Arnette Norris, MD   REQUESTING/REFERRING PHYSICIAN: Dr. Harrell Gave End  CHIEF COMPLAINT:   Chief Complaint  Patient presents with  . Code STEMI    HISTORY OF PRESENT ILLNESS:  Cassandra Cox  is a 56 y.o. female with a known history of Anxiety, depression, hyperlipidemia who presented to the hospital due to jaw pain and chest tightness that began earlier today. 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 She started looking up symptoms of heart attack and she had most of the symptoms including chest tightness, shortness of breath, diaphoresis, nausea and she had a few episodes of vomiting. She therefore was a bit concerned and therefore came to the ER for further evaluation. Patient's admission/ER EKG was consistent with ST elevations in the anterior lateral leads. Patient was urgently taken to the cardiac catheterization lab and underwent catheterization with stent placement to the mid LAD secondary to a 99% stenosis. Patient is currently chest pain, jaw pain free and hemodynamically stable.  PAST MEDICAL HISTORY:   Past Medical History:  Diagnosis Date  . Depression   . Hyperlipemia \\[id number] \  . Renal disorder     PAST SURGICAL HISTORY:   Past Surgical History:  Procedure Laterality Date  . ABDOMINAL HYSTERECTOMY      SOCIAL HISTORY:   Social History  Substance Use Topics  . Smoking status: Never Smoker  . Smokeless tobacco: Never Used  . Alcohol use No    FAMILY HISTORY:   Family History  Problem Relation Age of Onset  . Heart disease Mother     DRUG ALLERGIES:   Allergies  Allergen Reactions  . Prednisone     REACTION: Swelling  . Sulfonamide  Derivatives     REACTION: Hives    REVIEW OF SYSTEMS:   Review of Systems  Constitutional: Negative for fever and weight loss.  HENT: Negative for congestion, nosebleeds and tinnitus.   Eyes: Negative for blurred vision, double vision and redness.  Respiratory: Negative for cough, hemoptysis and shortness of breath.   Cardiovascular: Positive for chest pain. Negative for orthopnea, leg swelling and PND.  Gastrointestinal: Positive for nausea and vomiting. Negative for abdominal pain, diarrhea and melena.  Genitourinary: Negative for dysuria, hematuria and urgency.  Musculoskeletal: Negative for falls and joint pain.  Neurological: Negative for dizziness, tingling, sensory change, focal weakness, seizures, weakness and headaches.  Endo/Heme/Allergies: Negative for polydipsia. Does not bruise/bleed easily.  Psychiatric/Behavioral: Negative for depression and memory loss. The patient is not nervous/anxious.     MEDICATIONS AT HOME:   Prior to Admission medications   Medication Sig Start Date End Date Taking? Authorizing Provider  ALPRAZolam Duanne Moron) 1 MG tablet Take 1 mg by mouth at bedtime as needed.     Yes Historical Provider, MD  buPROPion (WELLBUTRIN XL) 150 MG 24 hr tablet Take 150 mg by mouth daily.     Yes Historical Provider, MD  Cholecalciferol (VITAMIN D-3) 5000 UNITS TABS Take 1 tablet by mouth daily.     Yes Historical Provider, MD  fish oil-omega-3 fatty acids 1000 MG capsule Take 1 g by mouth daily.     Yes Historical Provider, MD  FLUoxetine (PROZAC) 20  MG tablet Take 20 mg by mouth daily.     Yes Historical Provider, MD  magnesium gluconate (MAGONATE) 500 MG tablet Take 1,000 mg by mouth at bedtime.    Yes Historical Provider, MD  montelukast (SINGULAIR) 10 MG tablet Take 10 mg by mouth at bedtime.   Yes Historical Provider, MD  omeprazole (PRILOSEC) 20 MG capsule Take 20 mg by mouth daily.   Yes Historical Provider, MD  simvastatin (ZOCOR) 20 MG tablet Take 1 tablet (20  mg total) by mouth at bedtime. COMPLETE PHYSICAL EXAM REQUIRED FOR ADDITIONAL REFILLS 07/03/16  Yes Lucille Passy, MD  triamcinolone (NASACORT ALLERGY 24HR) 55 MCG/ACT AERO nasal inhaler Place 2 sprays into the nose daily.   Yes Historical Provider, MD  zolpidem (AMBIEN CR) 12.5 MG CR tablet Take 12.5 mg by mouth at bedtime as needed.     Yes Historical Provider, MD  HYDROcodone-acetaminophen (NORCO/VICODIN) 5-325 MG tablet Take 1-2 tablets by mouth every 6 (six) hours as needed for severe pain. Patient not taking: Reported on 12/02/2016 08/28/16   Zenovia Jarred, DO  nitrofurantoin, macrocrystal-monohydrate, (MACROBID) 100 MG capsule Take 1 capsule (100 mg total) by mouth 2 (two) times daily. Patient not taking: Reported on 12/02/2016 06/12/16   Pleas Koch, NP  ondansetron (ZOFRAN) 4 MG tablet Take 1 tablet (4 mg total) by mouth every 8 (eight) hours as needed for nausea or vomiting. Patient not taking: Reported on 12/02/2016 08/28/16   Zenovia Jarred, DO  valACYclovir (VALTREX) 1000 MG tablet take 2 tablets by mouth twice a day for ONE DAY Patient not taking: Reported on 12/02/2016 10/04/15   Lucille Passy, MD      VITAL SIGNS:  Blood pressure 137/89, pulse 82, resp. rate (!) 21, weight 93.9 kg (207 lb), SpO2 100 %.  PHYSICAL EXAMINATION:  Physical Exam  GENERAL:  56 y.o.-year-old patient lying in the bed in no acute distress.  EYES: Pupils equal, round, reactive to light and accommodation. No scleral icterus. Extraocular muscles intact.  HEENT: Head atraumatic, normocephalic. Oropharynx and nasopharynx clear. No oropharyngeal erythema, moist oral mucosa  NECK:  Supple, no jugular venous distention. No thyroid enlargement, no tenderness.  LUNGS: Normal breath sounds bilaterally, no wheezing, rales, rhonchi. No use of accessory muscles of respiration.  CARDIOVASCULAR: S1, S2 RRR. No murmurs, rubs, gallops, clicks.  ABDOMEN: Soft, nontender, nondistended. Bowel sounds present. No organomegaly  or mass.  EXTREMITIES: No pedal edema, cyanosis, or clubbing. + 2 pedal & radial pulses b/l.   NEUROLOGIC: Cranial nerves II through XII are intact. No focal Motor or sensory deficits appreciated b/l PSYCHIATRIC: The patient is alert and oriented x 3.   SKIN: No obvious rash, lesion, or ulcer.   LABORATORY PANEL:   CBC  Recent Labs Lab 12/02/16 1723  WBC 10.0  HGB 13.6  HCT 41.2  PLT 400   ------------------------------------------------------------------------------------------------------------------  Chemistries   Recent Labs Lab 12/02/16 1723  NA 136  K 3.6  CL 99*  CO2 22  GLUCOSE 166*  BUN 16  CREATININE 0.69  CALCIUM 9.6  AST 32  ALT 25  ALKPHOS 71  BILITOT 0.7   ------------------------------------------------------------------------------------------------------------------  Cardiac Enzymes  Recent Labs Lab 12/02/16 1723  TROPONINI <0.03   ------------------------------------------------------------------------------------------------------------------  RADIOLOGY:  Dg Chest 1 View  Result Date: 12/02/2016 CLINICAL DATA:  STEMI EXAM: CHEST 1 VIEW COMPARISON:  None. FINDINGS: Cardiac shadow is at the upper limits of normal in size. Very mild vascular congestion is noted likely related to the  underlying cardiac event. The lungs are clear. No bony abnormality is seen. IMPRESSION: Mild vascular congestion. Electronically Signed   By: Inez Catalina M.D.   On: 12/02/2016 17:46     IMPRESSION AND PLAN:   56 year old female with past medical history of anxiety/depression, hyperlipidemia and strong family history of heart disease who presented to the hospital due to jaw pain and chest tightness and noted to have ST elevation MI.  1. ST elevation MI-this is the cause of patient's jaw and chest pain on admission. -Patient is status post cardiac catheterization with stent placement to the mid LAD secondary to a 99% lesion. -Postcatheterization patient is  currently chest pain-free and hemodynamically stable. -Continue aspirin, Brillinta, Atorvastatin, Nitro.  - Echo in a.m. And cont. Further care as per Cardiology.   2. Depression - cont. Wellbutrin, Prozac   3. Anxiety - cont. PRN Xanax.   4. Hyperlipidemia - cont. Atorvastatin.     All the records are reviewed and case discussed with ED provider. Management plans discussed with the patient, family and they are in agreement.  CODE STATUS: full code  TOTAL TIME TAKING CARE OF THIS PATIENT: 45 minutes.    Henreitta Leber M.D on 12/02/2016 at 7:57 PM  Between 7am to 6pm - Pager - [phone]  After 6pm go to [email or web address] - password [email or web address] [facility]  Office  [phone]  CC: Primary care physician; Arnette Norris, MD

## 2016-12-02 NOTE — Progress Notes (Signed)
Chaplain received a code stemi to ED. As the medical team was providing care to patient, Chaplain attended family members of the patient. Chaplain provided spiritual support and drinks to family members under his care. Patient was transferred to ICU after tests the medical team wanted to run.

## 2016-12-02 NOTE — ED Notes (Signed)
Patient transported to cath lab by this RN, Malachy Mood, RN, and Dr. Harrell Gave End.

## 2016-12-02 NOTE — ED Notes (Signed)
Cardiology MD at bedside.

## 2016-12-02 NOTE — ED Notes (Signed)
Verbal consent given by patient for cath

## 2016-12-02 NOTE — ED Notes (Signed)
Zoll pads placed on patient.  

## 2016-12-02 NOTE — ED Provider Notes (Addendum)
[facility] Emergency Department Provider Note        Time seen: ----------------------------------------- 5:27 PM on 12/02/2016 -----------------------------------------    I have reviewed the triage vital signs and the nursing notes.   HISTORY  Chief Complaint Code STEMI    HPI Cassandra Cox is a 56 y.o. female who presents to the ER for acute onset of jaw pain as well as chest pain, back pain and nausea that started approximate 45 minutes prior to arrival. She's never had these symptoms before. Patient states she just had a manicure and pedicure and was feeling very relaxed and the symptoms started. She was given aspirin 325 mg in route by EMS. She had profuse vomiting and diaphoresis prior to arrival.   Past Medical History:  Diagnosis Date  . Depression   . Renal disorder     Patient Active Problem List   Diagnosis Date Noted  . Insomnia 08/25/2015  . Hot flashes, menopausal 08/25/2015  . Well woman exam 07/22/2015  . Family history of diabetes mellitus 07/15/2014  . Vitamin D deficiency 11/03/2009  . HLD (hyperlipidemia) 11/03/2009  . ANXIETY 11/03/2009  . Depression 11/03/2009    No past surgical history on file.  Allergies Prednisone and Sulfonamide derivatives  Social History Social History  Substance Use Topics  . Smoking status: Never Smoker  . Smokeless tobacco: Never Used  . Alcohol use No    Review of Systems Constitutional: Negative for fever. Cardiovascular: Positive for chest pain Respiratory: Positive shortness of breath Gastrointestinal: Negative for abdominal pain, positive for nausea Genitourinary: Negative for dysuria. Musculoskeletal: Positive for back pain Skin: Positive for diaphoresis Neurological: Negative for headaches, focal weakness or numbness.  10-point ROS otherwise negative.  ____________________________________________   PHYSICAL EXAM:  VITAL SIGNS: ED Triage Vitals [12/02/16 1723]   Enc Vitals Group     BP      Pulse      Resp      Temp      Temp src      SpO2      Weight 207 lb (93.9 kg)     Height      Head Circumference      Peak Flow      Pain Score      Pain Loc      Pain Edu?      Excl. in [place]?     Constitutional: Alert and oriented. Moderate distress Eyes: Conjunctivae are normal. PERRL. Normal extraocular movements. ENT   Head: Normocephalic and atraumatic.   Nose: No congestion/rhinnorhea.   Mouth/Throat: Mucous membranes are moist.   Neck: No stridor. Cardiovascular: Normal rate, regular rhythm. No murmurs, rubs, or gallops. Respiratory: Normal respiratory effort without tachypnea nor retractions. Breath sounds are clear and equal bilaterally. No wheezes/rales/rhonchi. Gastrointestinal: Soft and nontender. Normal bowel sounds Musculoskeletal: Nontender with normal range of motion in all extremities. No lower extremity tenderness nor edema. Neurologic:  Normal speech and language. No gross focal neurologic deficits are appreciated.  Skin:  Skin is warm, dry and intact. Diaphoresis is noted Psychiatric: Mood and affect are normal. Speech and behavior are normal.  ____________________________________________  EKG: Interpreted by me. Sinus rhythm rate of 78 bpm, normal PR interval, normal QRS size, long QT, some ST elevation is appreciated in the anterior lateral leads, ST depressions inferiorly suggestive of ischemia.  ____________________________________________  ED COURSE:  Pertinent labs & imaging results that were available during my care of the patient were reviewed by me and considered in  my medical decision making (see chart for details). Patient presents to the hospital as a STEMI alert. She does have symptoms consistent with acute coronary syndrome. EKG is borderline diagnostic for STEMI. We will continue STEMI activation. She'll receive aspirin and heparin.   Procedures ____________________________________________   LABS  (pertinent positives/negatives)  Labs Reviewed  CBC  DIFFERENTIAL  PROTIME-INR  APTT  COMPREHENSIVE METABOLIC PANEL  TROPONIN I  LIPID PANEL   CRITICAL CARE Performed by: Earleen Newport   Total critical care time: 15 minutes  Critical care time was exclusive of separately billable procedures and treating other patients.  Critical care was necessary to treat or prevent imminent or life-threatening deterioration.  Critical care was time spent personally by me on the following activities: development of treatment plan with patient and/or surrogate as well as nursing, discussions with consultants, evaluation of patient's response to treatment, examination of patient, obtaining history from patient or surrogate, ordering and performing treatments and interventions, ordering and review of laboratory studies, ordering and review of radiographic studies, pulse oximetry and re-evaluation of patient's condition.  RADIOLOGY Images were viewed by me  Chest x-ray Is unremarkable ____________________________________________  FINAL ASSESSMENT AND PLAN  Chest pain, unstable angina  Plan: Patient with labs and imaging as dictated above. Patient presents with a classic story for ACS and were line EKG findings for ST elevation MI. She does have ST depressions suggesting ischemia. We will activate the catheter lab team. She has received aspirin, nitroglycerin, heparin and Plavix. Vital signs are stable at this time.   Earleen Newport, MD   Note: This note was generated in part or whole with voice recognition software. Voice recognition is usually quite accurate but there are transcription errors that can and very often do occur. I apologize for any typographical errors that were not detected and corrected.     Earleen Newport, MD 12/02/16 1747    Earleen Newport, MD 12/02/16 [phone]

## 2016-12-02 NOTE — ED Triage Notes (Signed)
Pt brought in by [facility] from home for chest pain radiating to jaw and back and nausea approximately 45 minutes PTA. Patient was given 325 mg aspirin in route by EMS.

## 2016-12-02 NOTE — ED Notes (Signed)
Bedside report given to cath lab staff.

## 2016-12-03 ENCOUNTER — Encounter: Payer: Self-pay | Admitting: Internal Medicine

## 2016-12-03 ENCOUNTER — Inpatient Hospital Stay (HOSPITAL_COMMUNITY)
Admission: EM | Admit: 2016-12-03 | Discharge: 2016-12-03 | Disposition: A | Discharge status: Home / Self Care | Payer: BLUE CROSS/BLUE SHIELD | Source: Home / Self Care | Attending: Internal Medicine | Admitting: Internal Medicine

## 2016-12-03 DIAGNOSIS — I2109 ST elevation (STEMI) myocardial infarction involving other coronary artery of anterior wall: Secondary | ICD-10-CM

## 2016-12-03 DIAGNOSIS — I472 Ventricular tachycardia: Secondary | ICD-10-CM

## 2016-12-03 LAB — BASIC METABOLIC PANEL
Anion gap: 7 (ref 5–15)
BUN: 14 mg/dL (ref 6–20)
CO2: 27 mmol/L (ref 22–32)
Calcium: 9.1 mg/dL (ref 8.9–10.3)
Chloride: 105 mmol/L (ref 101–111)
Creatinine, Ser: 0.67 mg/dL (ref 0.44–1.00)
GFR calc Af Amer: >60 mL/min (ref >60)
GFR calc non Af Amer: >60 mL/min (ref >60)
Glucose, Bld: 99 mg/dL (ref 65–99)
Potassium: 4.2 mmol/L (ref 3.5–5.1)
Sodium: 139 mmol/L (ref 135–145)

## 2016-12-03 LAB — TROPONIN I
Troponin I: 6.24 ng/mL (ref <0.03)
Troponin I: 3.35 ng/mL (ref <0.03)

## 2016-12-03 LAB — CBC
HCT: 35.5 % (ref 35.0–47.0)
Hemoglobin: 12 g/dL (ref 12.0–16.0)
MCH: 30.4 pg (ref 26.0–34.0)
MCHC: 33.7 g/dL (ref 32.0–36.0)
MCV: 90.3 fL (ref 80.0–100.0)
Platelets: 310 10*3/uL (ref 150–440)
RBC: 3.94 MIL/uL (ref 3.80–5.20)
RDW: 14 % (ref 11.5–14.5)
WBC: 9.6 10*3/uL (ref 3.6–11.0)

## 2016-12-03 LAB — ECHOCARDIOGRAM COMPLETE
Height: 63 in
Weight: 3100.55 oz

## 2016-12-03 LAB — TSH: TSH: 2.411 u[IU]/mL (ref 0.350–4.500)

## 2016-12-03 MED ORDER — METOPROLOL SUCCINATE ER 25 MG PO TB24: 12.5000 mg | ORAL_TABLET | Freq: Every day | ORAL | 0 refills | Status: DC

## 2016-12-03 MED ORDER — NITROGLYCERIN 0.4 MG SL SUBL: 0.4000 mg | SUBLINGUAL_TABLET | SUBLINGUAL | 0 refills | Status: DC | PRN

## 2016-12-03 MED ORDER — INFLUENZA VAC SPLIT QUAD 0.5 ML IM SUSY
0.5000 mL | PREFILLED_SYRINGE | INTRAMUSCULAR | Status: AC
  Administered 2016-12-04: 0.5 mL via INTRAMUSCULAR
  Filled 2016-12-03: qty 0.5

## 2016-12-03 MED ORDER — ASPIRIN 81 MG PO CHEW: 81.0000 mg | CHEWABLE_TABLET | Freq: Once | ORAL | 0 refills | Status: AC

## 2016-12-03 MED ORDER — TICAGRELOR 90 MG PO TABS
90.0000 mg | ORAL_TABLET | Freq: Two times a day (BID) | ORAL | 0 refills | Status: DC
Start: 2016-12-03 — End: 2016-12-12

## 2016-12-03 MED ORDER — ATORVASTATIN CALCIUM 80 MG PO TABS: 80.0000 mg | ORAL_TABLET | Freq: Every day | ORAL | 0 refills | Status: DC

## 2016-12-03 NOTE — Progress Notes (Signed)
*  PRELIMINARY RESULTS* Echocardiogram 2D Echocardiogram has been performed.  Cassandra Cox 12/03/2016, 4:27 PM

## 2016-12-03 NOTE — Progress Notes (Signed)
[place] at [place] NAME: Cassandra Cox    MR#:  [id number]  DATE OF BIRTH:  August 01, 1961  SUBJECTIVE:   No CP overnight  REVIEW OF SYSTEMS:    Review of Systems  Constitutional: Negative.  Negative for chills, fever and malaise/fatigue.  HENT: Negative.  Negative for ear discharge, ear pain, hearing loss, nosebleeds and sore throat.   Eyes: Negative.  Negative for blurred vision and pain.  Respiratory: Negative.  Negative for cough, hemoptysis, shortness of breath and wheezing.   Cardiovascular: Negative.  Negative for chest pain, palpitations and leg swelling.  Gastrointestinal: Negative.  Negative for abdominal pain, blood in stool, diarrhea, nausea and vomiting.  Genitourinary: Negative.  Negative for dysuria.  Musculoskeletal: Negative.  Negative for back pain.  Skin: Negative.   Neurological: Negative for dizziness, tremors, speech change, focal weakness, seizures and headaches.  Endo/Heme/Allergies: Negative.  Does not bruise/bleed easily.  Psychiatric/Behavioral: Negative.  Negative for depression, hallucinations and suicidal ideas.    Tolerating Diet: yes      DRUG ALLERGIES:   Allergies  Allergen Reactions  . Prednisone     REACTION: Swelling  . Sulfonamide Derivatives     REACTION: Hives    VITALS:  Blood pressure 102/61, pulse 76, temperature 97.9 F (36.6 C), temperature source Oral, resp. rate 14, height 5' 3"  (1.6 m), weight 87.9 kg (193 lb 12.6 oz), SpO2 96 %.  PHYSICAL EXAMINATION:   Physical Exam  Constitutional: She is oriented to person, place, and time and well-developed, well-nourished, and in no distress. No distress.  HENT:  Head: Normocephalic.  Eyes: No scleral icterus.  Neck: Normal range of motion. Neck supple. No JVD present. No tracheal deviation present.  Cardiovascular: Normal rate, regular rhythm and normal heart sounds.  Exam reveals no gallop and no friction rub.   No murmur  heard. Pulmonary/Chest: Effort normal and breath sounds normal. No respiratory distress. She has no wheezes. She has no rales. She exhibits no tenderness.  Abdominal: Soft. Bowel sounds are normal. She exhibits no distension and no mass. There is no tenderness. There is no rebound and no guarding.  Musculoskeletal: Normal range of motion. She exhibits no edema.  Neurological: She is alert and oriented to person, place, and time.  Skin: Skin is warm. No rash noted. No erythema.  Psychiatric: Affect and judgment normal.      LABORATORY PANEL:   CBC  Recent Labs Lab 12/03/16 0524  WBC 9.6  HGB 12.0  HCT 35.5  PLT 310   ------------------------------------------------------------------------------------------------------------------  Chemistries   Recent Labs Lab 12/02/16 1723 12/02/16 2319 12/03/16 0524  NA 136  --  139  K 3.6  --  4.2  CL 99*  --  105  CO2 22  --  27  GLUCOSE 166*  --  99  BUN 16  --  14  CREATININE 0.69  --  0.67  CALCIUM 9.6  --  9.1  MG  --  2.0  --   AST 32  --   --   ALT 25  --   --   ALKPHOS 71  --   --   BILITOT 0.7  --   --    ------------------------------------------------------------------------------------------------------------------  Cardiac Enzymes  Recent Labs Lab 12/02/16 2319 12/03/16 0524 12/03/16 1055  TROPONINI 6.13* 6.24* 3.35*   ------------------------------------------------------------------------------------------------------------------  RADIOLOGY:  Dg Chest 1 View  Result Date: 12/02/2016 CLINICAL DATA:  STEMI EXAM: CHEST 1 VIEW COMPARISON:  None. FINDINGS: Cardiac  shadow is at the upper limits of normal in size. Very mild vascular congestion is noted likely related to the underlying cardiac event. The lungs are clear. No bony abnormality is seen. IMPRESSION: Mild vascular congestion. Electronically Signed   By: Inez Catalina M.D.   On: 12/02/2016 17:46     ASSESSMENT AND PLAN:   56 year old female with a  history of hyperlipidemia who presented with chest pain and found to have ST elevation MI.  1. ST elevation MI: Patient underwent heart catheterization which shows acute plaque rupture and thrombotic occlusion of mid LAD. She had successful PCI to mid LAD with drug-eluting stent. She will continue with aspirin and Brillanta for at least 12 months. She will continue on statin and low-dose metoprolol. She will have referral for cardiac rehabilitation at discharge.  2. Hyperlipidemia: Patient will continue statin and fish oil  3. Anxiety and depression: Patient will continue enzyme neck supple Wellbutrin  4. GERD on PPI  5. NSVT due to MI Continue Metoprolol Follow up on ECHO  Plan of care as above d/w Dr End Patient to stay in ICU overnight and if stable tx tele tomorrow with plan for d/c on Tuesday.   Management plans discussed with the patient and she is in agreement.  CODE STATUS: full  TOTAL TIME TAKING CARE OF THIS PATIENT: 30 minutes.     POSSIBLE D/C tuesday, DEPENDING ON CLINICAL CONDITION.   Diogenes Whirley M.D on 12/03/2016 at 12:43 PM  Between 7am to 6pm - Pager - [phone] After 6pm go to [email or web address] - password [email or web address] [facility]  Office  [phone]  CC: Primary care physician; Arnette Norris, MD  Note: This dictation was prepared with Dragon dictation along with smaller phrase technology. Any transcriptional errors that result from this process are unintentional.

## 2016-12-03 NOTE — Progress Notes (Addendum)
Progress Note  Patient Name: Cassandra Cox Date of Encounter: 12/03/2016  Primary Cardiologist: [place] well this morning.  No chest pain currently but had intermittent "bruised" sensation in the chest overnight and earlier this morning.  Palpitations last night corresponding to NSVT.  No shortness of breath.  No pain at right radial arteriotomy site.  Inpatient Medications    Scheduled Meds: . aspirin  324 mg Oral Once  . aspirin  81 mg Oral Once  . aspirin  81 mg Oral Daily  . atorvastatin  80 mg Oral q1800  . buPROPion  150 mg Oral Daily  . cholecalciferol  1,000 Units Oral Daily  . enoxaparin (LOVENOX) injection  40 mg Subcutaneous Q24H  . FLUoxetine  20 mg Oral Daily  . [START ON 12/04/2016] Influenza vac split quadrivalent PF  0.5 mL Intramuscular Tomorrow-1000  . metoprolol succinate  12.5 mg Oral QHS  . montelukast  10 mg Oral QHS  . omega-3 acid ethyl esters  1 g Oral Daily  . pantoprazole  40 mg Oral Daily  . sodium chloride flush  3 mL Intravenous Q12H  . ticagrelor  90 mg Oral BID   Continuous Infusions:  PRN Meds: sodium chloride, acetaminophen, ALPRAZolam, nitroGLYCERIN, ondansetron (ZOFRAN) IV, sodium chloride flush, zolpidem   Vital Signs    Vitals:   12/03/16 0300 12/03/16 0400 12/03/16 0500 12/03/16 0800  BP: 104/72 97/65 102/61   Pulse: 81 78 76   Resp: 15 11 14    Temp:  98.2 F (36.8 C)  97.9 F (36.6 C)  TempSrc:  Oral  Oral  SpO2: 95% 97% 96%   Weight:      Height:        Intake/Output Summary (Last 24 hours) at 12/03/16 1222 Last data filed at 12/03/16 0944  Gross per 24 hour  Intake           417.28 ml  Output             1050 ml  Net          -632.72 ml   Filed Weights   12/02/16 1723 12/02/16 2000  Weight: 207 lb (93.9 kg) 193 lb 12.6 oz (87.9 kg)    Telemetry    Sinus rhythm.  Occasional non-sustained VT (longest 20 beats last night at 2140) - Personally Reviewed  ECG    NSR, anteroseptal  infarct.  Non-specific ST/T changes - Personally Reviewed  Physical Exam   GEN: No acute distress. Boyfriend at the bedside. Neck: No JVD Cardiac: RRR, no murmurs, rubs, or gallops. 2+ right radial pulse. Respiratory: Clear to auscultation bilaterally. Normal work of breathing. GI: Soft, nontender, non-distended.  No HSM. MS: No edema; No deformity. Right radial arteriotomy site benign; clean and dry. Neuro:  AAOx3. Psych: Normal affect  Labs    Chemistry Recent Labs Lab 12/02/16 1723 12/03/16 0524  NA 136 139  K 3.6 4.2  CL 99* 105  CO2 22 27  GLUCOSE 166* 99  BUN 16 14  CREATININE 0.69 0.67  CALCIUM 9.6 9.1  PROT 8.1  --   ALBUMIN 4.5  --   AST 32  --   ALT 25  --   ALKPHOS 71  --   BILITOT 0.7  --   GFRNONAA >60 >60  GFRAA >60 >60  ANIONGAP 15 7     Hematology Recent Labs Lab 12/02/16 1723 12/03/16 0524  WBC 10.0 9.6  RBC 4.53 3.94  HGB 13.6 12.0  HCT 41.2 35.5  MCV 90.9 90.3  MCH 30.1 30.4  MCHC 33.1 33.7  RDW 13.8 14.0  PLT 400 310    Cardiac Enzymes Recent Labs Lab 12/02/16 1723 12/02/16 2319 12/03/16 0524 12/03/16 1055  TROPONINI <0.03 6.13* 6.24* 3.35*   No results for input(s): TROPIPOC in the last 168 hours.   BNPNo results for input(s): BNP, PROBNP in the last 168 hours.   DDimer No results for input(s): DDIMER in the last 168 hours.   Radiology    Dg Chest 1 View  Result Date: 12/02/2016 CLINICAL DATA:  STEMI EXAM: CHEST 1 VIEW COMPARISON:  None. FINDINGS: Cardiac shadow is at the upper limits of normal in size. Very mild vascular congestion is noted likely related to the underlying cardiac event. The lungs are clear. No bony abnormality is seen. IMPRESSION: Mild vascular congestion. Electronically Signed   By: Inez Catalina M.D.   On: 12/02/2016 17:46    Cardiac Studies   LHC/PCI (12/02/16): 1. Anterior STEMI with thrombotic occlusion of the mid LAD, likely due to acute plaque rupture. 2. Moderate diffuse atherosclerotic  plaquing of the proximal and mid LAD by IVUS. 3. Mildly elevated left ventricular filling pressure. 4. Successful IVUS-guided PCI to the mid LAD with placement of an Integrity Resolute 3.0 x 18 mm drug-eluting stent, post-dilated in the proximal and mid portions with 3.75 mm noncompliant balloon.  Patient Profile     56 y.o. female with history of hyperlipidemia and anxiety, admitted with anterior STEMI s/p PCI to mid LAD with DES x 1.  Assessment & Plan    STEMI: Patient feels well and is chest pain free at this time.  Vague "bruised" feeling may be related to inflammation from MI or stretching of LAD by stent.  Troponin peaked at 6.24 this AM.  EKG shows anteroseptal Q-waves, non-specific T-wave changes, and resolution of ST elevation noted on pre-PCI EKG.  Continue DAPT with ASA and ticagrelor for at least 12 months.  Continue metoprolol; consider uptitrating prior to discharge as heart rate and blood pressure allow.  Continue high-intensity statin.  Transthoracic echo pending.  Consider adding low-dose ACEI before discharge, if BP tolerates.  Non-sustained ventricular tachycardia: Likely due to recent MI and improving based on review of telemetry.  Continue metoprolol; uptitrate as heart rate and blood pressure tolerate.  Follow electrolytes and replete potassium and magnesium to keep levels at/above 4.0 and 2.0, respectively.  Follow-up echo.  If LVEF < 35%, patient will need to be discharged home with LifeVest.  Hyperlipidemia: Significant mixed hyperlipidemia on admission.  Continue atorvastatin 80 mg daily.  Disposition:  Given NSVT, would recommend keeping patient in the ICU overnight.  If stable, could be transferred to telemetry tomorrow.  Signed, Nelva Bush, MD  12/03/2016, 12:22 PM

## 2016-12-03 NOTE — Progress Notes (Signed)
Dr. Jannifer Franklin notified of patient's critical troponin 6.13.  No new orders at this time.  Will continue to monitor.

## 2016-12-03 NOTE — Progress Notes (Signed)
Pt is in stable condition at this time. No complaints of pain during my shift. Pt remains alert and oriented during my time. Pt has ambulated in the room safely. Pt educated on medications that have been started.

## 2016-12-03 NOTE — Progress Notes (Signed)
Dr. Saunders Revel notified of patient having two episodes of non-sustained Vtach (7 beat run at 2055 and 20 beat run at 2140).  Orders placed to give PO potassium x1 and to check magnesium level and replace if less than 2.0

## 2016-12-04 ENCOUNTER — Encounter: Payer: Self-pay | Admitting: Internal Medicine

## 2016-12-04 LAB — CBC
HCT: 35.6 % (ref 35.0–47.0)
Hemoglobin: 12.2 g/dL (ref 12.0–16.0)
MCH: 30.8 pg (ref 26.0–34.0)
MCHC: 34.3 g/dL (ref 32.0–36.0)
MCV: 89.6 fL (ref 80.0–100.0)
Platelets: 306 10*3/uL (ref 150–440)
RBC: 3.97 MIL/uL (ref 3.80–5.20)
RDW: 14 % (ref 11.5–14.5)
WBC: 9.8 10*3/uL (ref 3.6–11.0)

## 2016-12-04 LAB — BASIC METABOLIC PANEL
Anion gap: 6 (ref 5–15)
BUN: 23 mg/dL — ABNORMAL HIGH (ref 6–20)
CO2: 24 mmol/L (ref 22–32)
Calcium: 9.1 mg/dL (ref 8.9–10.3)
Chloride: 105 mmol/L (ref 101–111)
Creatinine, Ser: 0.66 mg/dL (ref 0.44–1.00)
GFR calc Af Amer: >60 mL/min (ref >60)
GFR calc non Af Amer: >60 mL/min (ref >60)
Glucose, Bld: 112 mg/dL — ABNORMAL HIGH (ref 65–99)
Potassium: 3.8 mmol/L (ref 3.5–5.1)
Sodium: 135 mmol/L (ref 135–145)

## 2016-12-04 LAB — GLUCOSE, CAPILLARY: Glucose-Capillary: 105 mg/dL — ABNORMAL HIGH (ref 65–99)

## 2016-12-04 LAB — MAGNESIUM: Magnesium: 1.7 mg/dL (ref 1.7–2.4)

## 2016-12-04 LAB — HEMOGLOBIN A1C
Hgb A1c MFr Bld: 6.1 % — ABNORMAL HIGH (ref 4.8–5.6)
Mean Plasma Glucose: 128 mg/dL

## 2016-12-04 MED ORDER — ATORVASTATIN CALCIUM 80 MG PO TABS: 80.0000 mg | ORAL_TABLET | Freq: Every day | ORAL | 0 refills | Status: DC

## 2016-12-04 NOTE — Progress Notes (Signed)
[place], [place].   12/04/2016  Patient: Cassandra Cox   Date of Birth:  15-Nov-1960  Date of admission:  12/02/2016  Date of Discharge  12/04/2016    To Whom it May Concern:   Anedra Penafiel  may return to work on 12/19/2016.  If you have any questions or concerns, please don't hesitate to call.  Sincerely,   Hillary Bow R M.D Office : [phone]   .

## 2016-12-04 NOTE — Progress Notes (Signed)
Chaplain received an order to do a follow up visit. Pt was very welcoming of the Chaplain. Pt was emotionally stable upon leaving. Pt was in good spirits and awaiting discharge.    12/04/16 1010  Clinical Encounter Type  Visited With Patient  Visit Type Follow-up;Spiritual support  Referral From Nurse  Consult/Referral To Chaplain  Spiritual Encounters  Spiritual Needs Emotional

## 2016-12-04 NOTE — Care Management (Signed)
Admitted with stemi.  Required emergent cardiac cath which resulted in PCI.  Will discharge home on Brilinta.  Confirmed patient was insurance with pharmacy coverage.  Provided with 30 day coupon and another for copay assist.

## 2016-12-04 NOTE — Progress Notes (Signed)
Patient discharged home as ordered,priscriptions given,instructions explained and well understood,hemodynamics within normal limits,escorted by spouse and staff member via wheel chair.

## 2016-12-04 NOTE — Progress Notes (Signed)
[place] at [place] NAME: Gia Lusher    MR#:  [id number]  DATE OF BIRTH:  Feb 16, 1961  SUBJECTIVE:   Complains of some pleuritic chest pain, left  REVIEW OF SYSTEMS:    Review of Systems  Constitutional: Negative.  Negative for chills, fever and malaise/fatigue.  HENT: Negative.  Negative for ear discharge, ear pain, hearing loss, nosebleeds and sore throat.   Eyes: Negative.  Negative for blurred vision and pain.  Respiratory: Negative.  Negative for cough, hemoptysis, shortness of breath and wheezing.   Cardiovascular: Negative.  Negative for chest pain, palpitations and leg swelling.  Gastrointestinal: Negative.  Negative for abdominal pain, blood in stool, diarrhea, nausea and vomiting.  Genitourinary: Negative.  Negative for dysuria.  Musculoskeletal: Negative.  Negative for back pain.  Skin: Negative.   Neurological: Negative for dizziness, tremors, speech change, focal weakness, seizures and headaches.  Endo/Heme/Allergies: Negative.  Does not bruise/bleed easily.  Psychiatric/Behavioral: Negative.  Negative for depression, hallucinations and suicidal ideas.   DRUG ALLERGIES:   Allergies  Allergen Reactions  . Prednisone     REACTION: Swelling  . Sulfonamide Derivatives     REACTION: Hives    VITALS:  Blood pressure 112/72, pulse 92, temperature 98.6 F (37 C), temperature source Oral, resp. rate (!) 23, height 5' 3"  (1.6 m), weight 87.9 kg (193 lb 12.6 oz), SpO2 96 %.  PHYSICAL EXAMINATION:   Physical Exam  Constitutional: She is oriented to person, place, and time and well-developed, well-nourished, and in no distress. No distress.  HENT:  Head: Normocephalic.  Eyes: No scleral icterus.  Neck: Normal range of motion. Neck supple. No JVD present. No tracheal deviation present.  Cardiovascular: Normal rate, regular rhythm and normal heart sounds.  Exam reveals no gallop and no friction rub.   No murmur  heard. Pulmonary/Chest: Effort normal and breath sounds normal. No respiratory distress. She has no wheezes. She has no rales. She exhibits no tenderness.  Abdominal: Soft. Bowel sounds are normal. She exhibits no distension and no mass. There is no tenderness. There is no rebound and no guarding.  Musculoskeletal: Normal range of motion. She exhibits no edema.  Neurological: She is alert and oriented to person, place, and time.  Skin: Skin is warm. No rash noted. No erythema.  Psychiatric: Affect and judgment normal.   LABORATORY PANEL:   CBC  Recent Labs Lab 12/04/16 0621  WBC 9.8  HGB 12.2  HCT 35.6  PLT 306   ------------------------------------------------------------------------------------------------------------------  Chemistries   Recent Labs Lab 12/02/16 1723  12/04/16 0621  NA 136  < > 135  K 3.6  < > 3.8  CL 99*  < > 105  CO2 22  < > 24  GLUCOSE 166*  < > 112*  BUN 16  < > 23*  CREATININE 0.69  < > 0.66  CALCIUM 9.6  < > 9.1  MG  --   < > 1.7  AST 32  --   --   ALT 25  --   --   ALKPHOS 71  --   --   BILITOT 0.7  --   --   < > = values in this interval not displayed. ------------------------------------------------------------------------------------------------------------------  Cardiac Enzymes  Recent Labs Lab 12/02/16 2319 12/03/16 0524 12/03/16 1055  TROPONINI 6.13* 6.24* 3.35*   ------------------------------------------------------------------------------------------------------------------  RADIOLOGY:  Dg Chest 1 View  Result Date: 12/02/2016 CLINICAL DATA:  STEMI EXAM: CHEST 1 VIEW COMPARISON:  None. FINDINGS: Cardiac  shadow is at the upper limits of normal in size. Very mild vascular congestion is noted likely related to the underlying cardiac event. The lungs are clear. No bony abnormality is seen. IMPRESSION: Mild vascular congestion. Electronically Signed   By: Inez Catalina M.D.   On: 12/02/2016 17:46     ASSESSMENT AND PLAN:    56 year old female with a history of hyperlipidemia who presented with chest pain and found to have ST elevation MI.  1. ST elevation MI: Patient underwent heart catheterization which shows acute plaque rupture and thrombotic occlusion of mid LAD. She had successful PCI to mid LAD with drug-eluting stent. Aspirin and Brillanta for at least 12 months. She will continue on statin and low-dose metoprolol. She will need referral for cardiac rehabilitation at discharge.  2. Hyperlipidemia: Patient will continue statin and fish oil  3. Anxiety and depression  Wellbutrin  4. GERD on PPI  5. NSVT due to MI Continue Metoprolol  Still has some chest pain but could be musculoskeletal. Dicussed with cardiology  Management plans discussed with the patient and she is in agreement.  CODE STATUS: full  TOTAL TIME TAKING CARE OF THIS PATIENT: 30 minutes.   POSSIBLE D/C tuesday, DEPENDING ON CLINICAL CONDITION.  Hillary Bow R M.D on 12/04/2016 at 1:55 PM  Between 7am to 6pm - Pager - [phone]  After 6pm go to [email or web address] - password [email or web address] [facility]  Office  [phone]  CC: Primary care physician; Arnette Norris, MD  Note: This dictation was prepared with Dragon dictation along with smaller phrase technology. Any transcriptional errors that result from this process are unintentional.

## 2016-12-04 NOTE — Progress Notes (Signed)
Progress Note  Patient Name: Cassandra Cox Date of Encounter: 12/04/2016  Primary Cardiologist: Andree Coss, MD   Subjective   She has had some chest wall pain since echo on 1/28.  This is worsened some by position changes and palpation.  No dyspnea or recurrent angina.  Eager to go home.  Inpatient Medications    Scheduled Meds: . aspirin  81 mg Oral Daily  . atorvastatin  80 mg Oral q1800  . buPROPion  150 mg Oral Daily  . cholecalciferol  1,000 Units Oral Daily  . enoxaparin (LOVENOX) injection  40 mg Subcutaneous Q24H  . FLUoxetine  20 mg Oral Daily  . Influenza vac split quadrivalent PF  0.5 mL Intramuscular Tomorrow-1000  . metoprolol succinate  12.5 mg Oral QHS  . montelukast  10 mg Oral QHS  . omega-3 acid ethyl esters  1 g Oral Daily  . pantoprazole  40 mg Oral Daily  . sodium chloride flush  3 mL Intravenous Q12H  . ticagrelor  90 mg Oral BID   Continuous Infusions:  PRN Meds: sodium chloride, acetaminophen, ALPRAZolam, nitroGLYCERIN, ondansetron (ZOFRAN) IV, sodium chloride flush, zolpidem   Vital Signs    Vitals:   12/04/16 0200 12/04/16 0400 12/04/16 0600 12/04/16 0800  BP: (!) 101/57 99/67 100/72 112/72  Pulse:      Resp: 14 10 (!) 23   Temp:    98.6 F (37 C)  TempSrc:    Oral  SpO2:      Weight:      Height:        Intake/Output Summary (Last 24 hours) at 12/04/16 1236 Last data filed at 12/04/16 1057  Gross per 24 hour  Intake               10 ml  Output                0 ml  Net               10 ml   Filed Weights   12/02/16 1723 12/02/16 2000  Weight: 207 lb (93.9 kg) 193 lb 12.6 oz (87.9 kg)    Physical Exam   GEN: Well nourished, well developed, in no acute distress.  HEENT: Grossly normal.  Neck: Supple, no JVD, carotid bruits, or masses. Cardiac: RRR, no murmurs, rubs, or gallops. No clubbing, cyanosis, edema.  Radials/DP/PT 2+ and equal bilaterally. R wrist cath site w/o bleeding/bruit/hematoma. Respiratory:  Respirations  regular and unlabored, clear to auscultation bilaterally. GI: Soft, nontender, nondistended, BS + x 4. MS: no deformity or atrophy. Skin: warm and dry, no rash. Neuro:  Strength and sensation are intact. Psych: AAOx3.  Normal affect.  Labs    Chemistry Recent Labs Lab 12/02/16 1723 12/03/16 0524 12/04/16 0621  NA 136 139 135  K 3.6 4.2 3.8  CL 99* 105 105  CO2 22 27 24   GLUCOSE 166* 99 112*  BUN 16 14 23*  CREATININE 0.69 0.67 0.66  CALCIUM 9.6 9.1 9.1  PROT 8.1  --   --   ALBUMIN 4.5  --   --   AST 32  --   --   ALT 25  --   --   ALKPHOS 71  --   --   BILITOT 0.7  --   --   GFRNONAA >60 >60 >60  GFRAA >60 >60 >60  ANIONGAP 15 7 6      Hematology Recent Labs Lab 12/02/16 1723 12/03/16 [id number] 12/04/16 [id number]  WBC 10.0 9.6 9.8  RBC 4.53 3.94 3.97  HGB 13.6 12.0 12.2  HCT 41.2 35.5 35.6  MCV 90.9 90.3 89.6  MCH 30.1 30.4 30.8  MCHC 33.1 33.7 34.3  RDW 13.8 14.0 14.0  PLT 400 310 306    Cardiac Enzymes Recent Labs Lab 12/02/16 1723 12/02/16 2319 12/03/16 0524 12/03/16 1055  TROPONINI <0.03 6.13* 6.24* 3.35*     Radiology    Dg Chest 1 View  Result Date: 12/02/2016 CLINICAL DATA:  STEMI EXAM: CHEST 1 VIEW COMPARISON:  None. FINDINGS: Cardiac shadow is at the upper limits of normal in size. Very mild vascular congestion is noted likely related to the underlying cardiac event. The lungs are clear. No bony abnormality is seen. IMPRESSION: Mild vascular congestion. Electronically Signed   By: Inez Catalina M.D.   On: 12/02/2016 17:46    Telemetry    RSR - Personally Reviewed  Cardiac Studies   2D Echocardiogram 1.28.2018  Study Conclusions   - Left ventricle: There was mild concentric hypertrophy. Systolic   function was normal. The estimated ejection fraction was in the   range of 55% to 60%. Features are consistent with a pseudonormal   left ventricular filling pattern, with concomitant abnormal   relaxation and increased filling pressure (grade 2  diastolic   dysfunction). Doppler parameters are consistent with   indeterminate ventricular filling pressure. - Aortic valve: Transvalvular velocity was within the normal range.   There was no stenosis. There was no regurgitation. Valve area   (Vmax): 1.85 cm^2. - Mitral valve: Transvalvular velocity was within the normal range.   There was no evidence for stenosis. There was no regurgitation. - Left atrium: The atrium was severely dilated. - Right ventricle: The cavity size was normal. Wall thickness was   normal. Systolic function was normal. - Tricuspid valve: There was no regurgitation. - Pulmonary arteries: Systolic pressure was within the normal   range. PA peak pressure: 26 mm Hg (S).   Patient Profile     56 y.o. female w/ a h/o anxiety and HL, who was admitted 1/27 with chest pain and anterior STEMI, now s/p PCI/DES of the LAD.  Assessment & Plan    1.  Acute anterior STEMI/CAD:  S/p PCI/DES to the prox/mid LAD on 1/27.  Overall doing well w/o angina, though she has had some chest wall pain.  She first noticed this during echo - she says tech had to push pretty hard.  Chest just feels 'bruised.'  This is mild overall, but worse with position changes and palpation of lower sternum.  No change with deep breathing or lying flat, thus doubt pericarditis. No effusion noted on echo.  Echo showed nl EF.  Cont asa, statin,  blocker, and brilinta.  No acei 2/2 soft bp's.  Ambulate today.  Plan d/c tomorrow.  2.  HL:  LDL 206.  Cont high potency statin.  3. NSVT:  No recurrence.  Cont  blocker.   4.  Diastolic dysfxn:  Echo showed nl EF with grade 2 dd.  Hr/bp stable. euvolemic on exam.  Signed, Murray Hodgkins, NP  12/04/2016, 12:36 PM

## 2016-12-04 NOTE — Progress Notes (Signed)
Pt transported to room 232 in stable condition. Pt remains A&O. No further complaints of pain. Pt is being transported via wheelchair by Baker Janus, [place].

## 2016-12-04 NOTE — Accreditation Note (Signed)
Patient received from CCU alert and oriented x4,hemodynamically stable,denies pain.

## 2016-12-05 ENCOUNTER — Telehealth: Payer: Self-pay | Admitting: *Deleted

## 2016-12-05 LAB — POCT ACTIVATED CLOTTING TIME: Activated Clotting Time: 290 seconds

## 2016-12-05 NOTE — Telephone Encounter (Signed)
-----   Message from Wellington Hampshire, MD sent at 12/04/2016  4:00 PM EST ----- TCM follow up needed with Dr. Saunders Revel in 1 week.

## 2016-12-05 NOTE — Telephone Encounter (Signed)
Patient contacted regarding discharge from [facility] on 12/04/16.  Patient understands to follow up with provider ? On 12/12/16 at 2:15 pm   with Dr End.  Patient understands discharge instructions?  Yes, except for if she could take Ambien CR.  Patient understands medications and regiment? Yes  Patient understands to bring all medications to this visit? Yes    Patient has question about why it was stated on her discharge AVS to stop taking her Ambien CR. She said she suffers from severe insomnia and has been taking Ambien CR for nearly 20 years following a difficult time in her life. She stated she spoke with her pharmacist who could not find a contraindication with her medications as to why not take it.  She is follow by a Dr [facility] in [place] who prescribes the medication. She says xanax does help her relax but it does not help her sleep. She does not take the xanax very often. She says if she does not take the Ambien CR she will not sleep at all.  Patient wants to know if it is ok to continue taking since she was told to stop it on her discharge summary from the hospital yesterday. She said she did take it last night.  Will route to Dr End for advice.

## 2016-12-05 NOTE — Discharge Summary (Signed)
[facility] [place] NAME: Cassandra Cox    MR#:  [id number]  DATE OF BIRTH:  01/21/1961  DATE OF ADMISSION:  12/02/2016 ADMITTING PHYSICIAN: Nelva Bush, MD  DATE OF DISCHARGE: 12/04/2016  6:03 PM  PRIMARY CARE PHYSICIAN: Arnette Norris, MD   ADMISSION DIAGNOSIS:  Chest pain due to myocardial ischemia, unspecified ischemic chest pain type ([place]) [I20.9]  DISCHARGE DIAGNOSIS:  Principal Problem:   ST elevation myocardial infarction involving left anterior descending (LAD) coronary artery (HCC) Active Problems:   HLD (hyperlipidemia)   STEMI (ST elevation myocardial infarction) ([place])   SECONDARY DIAGNOSIS:   Past Medical History:  Diagnosis Date  . Depression   . Hyperlipemia \[id number]\  . Renal disorder   . STEMI (ST elevation myocardial infarction) ([place]) 12/02/2016     ADMITTING HISTORY  Cassandra Cox  is a 56 y.o. female with a known history of Anxiety, depression, hyperlipidemia who presented to the hospital due to jaw pain and chest tightness that began earlier today. 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 She started looking up symptoms of heart attack and she had most of the symptoms including chest tightness, shortness of breath, diaphoresis, nausea and she had a few episodes of vomiting. She therefore was a bit concerned and therefore came to the ER for further evaluation. Patient's admission/ER EKG was consistent with ST elevations in the anterior lateral leads. Patient was urgently taken to the cardiac catheterization lab and underwent catheterization with stent placement to the mid LAD secondary to a 99% stenosis. Patient is currently chest pain, jaw pain free and hemodynamically stable.  HOSPITAL COURSE:   56 year old female with a history of hyperlipidemia who presented with chest pain and found to  have ST elevation MI.  1. ST elevation MI: Patient underwent heart catheterization which shows acute plaque rupture and thrombotic occlusion of mid LAD. She had successful PCI to mid LAD with drug-eluting stent. Aspirin and Brillanta for at least 12 months. She will continue on statin and low-dose metoprolol. cardiac rehabilitation referable maintained at discharge.  2. Hyperlipidemia: Patient will continue statin and fish oil  3. Anxiety and depression  Wellbutrin  4. GERD on PPI  5. NSVT due to MI Continue Metoprolol Echocardiogram showed normal ejection fraction  Case discussed Dr. Zoila Shutter cardiology on day of discharge. Patient is being discharged home in a stable condition. She will remain off work for 2 weeks. Follow-up with cardiology and primary care physician in one to 2 weeks.  CONSULTS OBTAINED:  Treatment Team:  Nelva Bush, MD  DRUG ALLERGIES:   Allergies  Allergen Reactions  . Prednisone     REACTION: Swelling  . Sulfonamide Derivatives     REACTION: Hives    DISCHARGE MEDICATIONS:   Discharge Medication List as of 12/04/2016  4:57 PM    START taking these medications   Details  aspirin 81 MG chewable tablet Chew 1 tablet (81 mg total) by mouth once., Starting Sun 12/03/2016, Normal    metoprolol succinate (TOPROL-XL) 25 MG 24 hr tablet Take 0.5 tablets (12.5 mg total) by mouth at bedtime., Starting Sun 12/03/2016, Print    nitroGLYCERIN (NITROSTAT) 0.4 MG SL tablet Place 1 tablet (0.4 mg total) under the tongue every 5 (five) minutes as needed for chest pain., Starting Sun 12/03/2016, Print    ticagrelor (BRILINTA) 90 MG  TABS tablet Take 1 tablet (90 mg total) by mouth 2 (two) times daily., Starting Sun 12/03/2016, Print      CONTINUE these medications which have CHANGED   Details  atorvastatin (LIPITOR) 80 MG tablet Take 1 tablet (80 mg total) by mouth daily at 6 PM., Starting Mon 12/04/2016, Print      CONTINUE these medications which have NOT  CHANGED   Details  ALPRAZolam (XANAX) 1 MG tablet Take 1 mg by mouth at bedtime as needed.  , Historical Med    buPROPion (WELLBUTRIN XL) 150 MG 24 hr tablet Take 150 mg by mouth daily.  , Historical Med    Cholecalciferol (VITAMIN D-3) 5000 UNITS TABS Take 1 tablet by mouth daily.  , Historical Med    fish oil-omega-3 fatty acids 1000 MG capsule Take 1 g by mouth daily.  , Historical Med    FLUoxetine (PROZAC) 20 MG tablet Take 20 mg by mouth daily.  , Historical Med    magnesium gluconate (MAGONATE) 500 MG tablet Take 1,000 mg by mouth at bedtime. , Historical Med    montelukast (SINGULAIR) 10 MG tablet Take 10 mg by mouth at bedtime., Historical Med    omeprazole (PRILOSEC) 20 MG capsule Take 20 mg by mouth daily., Historical Med    triamcinolone (NASACORT ALLERGY 24HR) 55 MCG/ACT AERO nasal inhaler Place 2 sprays into the nose daily., Historical Med      STOP taking these medications     simvastatin (ZOCOR) 20 MG tablet      zolpidem (AMBIEN CR) 12.5 MG CR tablet      HYDROcodone-acetaminophen (NORCO/VICODIN) 5-325 MG tablet      nitrofurantoin, macrocrystal-monohydrate, (MACROBID) 100 MG capsule      ondansetron (ZOFRAN) 4 MG tablet      valACYclovir (VALTREX) 1000 MG tablet         Today   VITAL SIGNS:  Blood pressure 107/69, pulse 79, temperature 98.6 F (37 C), temperature source Oral, resp. rate 20, height 5' 3"  (1.6 m), weight 87.9 kg (193 lb 12.6 oz), SpO2 99 %.  I/O:  No intake or output data in the 24 hours ending 12/05/16 1633  PHYSICAL EXAMINATION:  Physical Exam  GENERAL:  56 y.o.-year-old patient lying in the bed with no acute distress.  LUNGS: Normal breath sounds bilaterally, no wheezing, rales,rhonchi or crepitation. No use of accessory muscles of respiration.  CARDIOVASCULAR: S1, S2 normal. No murmurs, rubs, or gallops.  ABDOMEN: Soft, non-tender, non-distended. Bowel sounds present. No organomegaly or mass.  NEUROLOGIC: Moves all 4  extremities. PSYCHIATRIC: The patient is alert and oriented x 3.  SKIN: No obvious rash, lesion, or ulcer.   DATA REVIEW:   CBC  Recent Labs Lab 12/04/16 0621  WBC 9.8  HGB 12.2  HCT 35.6  PLT 306    Chemistries   Recent Labs Lab 12/02/16 1723  12/04/16 0621  NA 136  < > 135  K 3.6  < > 3.8  CL 99*  < > 105  CO2 22  < > 24  GLUCOSE 166*  < > 112*  BUN 16  < > 23*  CREATININE 0.69  < > 0.66  CALCIUM 9.6  < > 9.1  MG  --   < > 1.7  AST 32  --   --   ALT 25  --   --   ALKPHOS 71  --   --   BILITOT 0.7  --   --   < > = values  in this interval not displayed.  Cardiac Enzymes  Recent Labs Lab 12/03/16 1055  TROPONINI 3.35*    Microbiology Results  Results for orders placed or performed during the hospital encounter of 12/02/16  MRSA PCR Screening     Status: None   Collection Time: 12/02/16  7:40 PM  Result Value Ref Range Status   MRSA by PCR NEGATIVE NEGATIVE Final    Comment:        The GeneXpert MRSA Assay (FDA approved for NASAL specimens only), is one component of a comprehensive MRSA colonization surveillance program. It is not intended to diagnose MRSA infection nor to guide or monitor treatment for MRSA infections.     RADIOLOGY:  No results found.  Follow up with PCP in 1 week.  Management plans discussed with the patient, family and they are in agreement.  CODE STATUS:  Code Status History    Date Active Date Inactive Code Status Order ID Comments User Context   12/02/2016  7:48 PM 12/04/2016  9:08 PM Full Code [id number]  Nelva Bush, MD Inpatient      TOTAL TIME TAKING CARE OF THIS PATIENT ON DAY OF DISCHARGE: more than 30 minutes.   Hillary Bow R M.D on 12/05/2016 at 4:33 PM  Between 7am to 6pm - Pager - [phone]  After 6pm go to [email or web address] - password [email or web address] [facility]  Office  [phone]  CC: Primary care physician; Arnette Norris, MD  Note: This dictation was prepared with Dragon  dictation along with smaller phrase technology. Any transcriptional errors that result from this process are unintentional.

## 2016-12-06 NOTE — Telephone Encounter (Signed)
It is ok to use it on occasion. If she finds that she needs it regularly, she should speak with her PCP about further sleep evaluation.  Thanks.  Gerald Stabs

## 2016-12-06 NOTE — Telephone Encounter (Signed)
Patient verbalized understanding of Dr Darnelle Bos advice.

## 2016-12-11 ENCOUNTER — Encounter: Payer: Self-pay | Admitting: Family Medicine

## 2016-12-11 ENCOUNTER — Ambulatory Visit (INDEPENDENT_AMBULATORY_CARE_PROVIDER_SITE_OTHER): Payer: BLUE CROSS/BLUE SHIELD | Admitting: Family Medicine

## 2016-12-11 VITALS — BP 116/62 | HR 74 | Temp 98.3°F | Wt 191.2 lb

## 2016-12-11 DIAGNOSIS — I2102 ST elevation (STEMI) myocardial infarction involving left anterior descending coronary artery: Secondary | ICD-10-CM | POA: Diagnosis not present

## 2016-12-11 NOTE — Progress Notes (Signed)
Subjective:   Patient ID: Cassandra Cox, female    DOB: 06/20/1961, 56 y.o.   MRN: [id number]  Cassandra Cox is a pleasant 56 y.o. year old female who presents to clinic today with Hospitalization Follow-up  on 12/11/2016  HPI:  Admitted to [facility] 1/27- 12/04/16. Notes reviewed.  Presented with CP and jaw pain, and found to be having an STEMI.  Underwent cardiac cath which showed acute plaque rupture and thrombotic occlusion of LAD. PCI to mid LAD with DES was successful. Discharged home on ASA and Brilinta. Advised to continue statin and beta blocker. Referred to cardiac rehab.  Has only had to use nitroglycerin once.  Overall feels good, just nervous.  Has appt with cardiology tomorrow.  Current Outpatient Prescriptions on File Prior to Visit  Medication Sig Dispense Refill  . ALPRAZolam (XANAX) 1 MG tablet Take 1 mg by mouth at bedtime as needed.      Marland Kitchen atorvastatin (LIPITOR) 80 MG tablet Take 1 tablet (80 mg total) by mouth daily at 6 PM. 30 tablet 0  . buPROPion (WELLBUTRIN XL) 150 MG 24 hr tablet Take 150 mg by mouth daily.      . Cholecalciferol (VITAMIN D-3) 5000 UNITS TABS Take 1 tablet by mouth daily.      . fish oil-omega-3 fatty acids 1000 MG capsule Take 1 g by mouth daily.      Marland Kitchen FLUoxetine (PROZAC) 20 MG tablet Take 20 mg by mouth daily.      . magnesium gluconate (MAGONATE) 500 MG tablet Take 1,000 mg by mouth at bedtime.     . metoprolol succinate (TOPROL-XL) 25 MG 24 hr tablet Take 0.5 tablets (12.5 mg total) by mouth at bedtime. 30 tablet 0  . montelukast (SINGULAIR) 10 MG tablet Take 10 mg by mouth at bedtime.    . nitroGLYCERIN (NITROSTAT) 0.4 MG SL tablet Place 1 tablet (0.4 mg total) under the tongue every 5 (five) minutes as needed for chest pain. 30 tablet 0  . omeprazole (PRILOSEC) 20 MG capsule Take 20 mg by mouth daily.    . ticagrelor (BRILINTA) 90 MG TABS tablet Take 1 tablet (90 mg total) by mouth 2 (two) times daily. 60 tablet 0  . triamcinolone  (NASACORT ALLERGY 24HR) 55 MCG/ACT AERO nasal inhaler Place 2 sprays into the nose daily.     No current facility-administered medications on file prior to visit.     Allergies  Allergen Reactions  . Prednisone     REACTION: Swelling  . Sulfonamide Derivatives     REACTION: Hives    Past Medical History:  Diagnosis Date  . Depression   . Hyperlipemia \\[id number] \  . Renal disorder   . STEMI (ST elevation myocardial infarction) ([place]) 12/02/2016    Past Surgical History:  Procedure Laterality Date  . ABDOMINAL HYSTERECTOMY    . CARDIAC CATHETERIZATION N/A 12/02/2016   Procedure: Coronary Stent Intervention;  Surgeon: Nelva Bush, MD;  Location: [place] [facility];  Service: Cardiovascular;  Laterality: N/A;  . CARDIAC CATHETERIZATION N/A 12/02/2016   Procedure: Left Heart Cath and Coronary Angiography;  Surgeon: Nelva Bush, MD;  Location: [place] [facility];  Service: Cardiovascular;  Laterality: N/A;  . CARDIAC CATHETERIZATION N/A 12/02/2016   Procedure: Intravascular Ultrasound/IVUS;  Surgeon: Nelva Bush, MD;  Location: [place] [facility];  Service: Cardiovascular;  Laterality: N/A;    Family History  Problem Relation Age of Onset  . Heart disease Mother     Social History  Social History  . Marital status: Divorced    Spouse name: N/A  . Number of children: N/A  . Years of education: N/A   Occupational History  . Not on file.   Social History Main Topics  . Smoking status: Never Smoker  . Smokeless tobacco: Never Used  . Alcohol use No  . Drug use: No  . Sexual activity: Not on file   Other Topics Concern  . Not on file   Social History Narrative  . No narrative on file   The PMH, PSH, Social History, Family History, Medications, and allergies have been reviewed in [facility], and have been updated if relevant.      Review of Systems  Respiratory: Negative.   Cardiovascular: Negative.   Gastrointestinal: Negative.   Endocrine: Negative.    Genitourinary: Negative.   Musculoskeletal: Negative.   Allergic/Immunologic: Negative.   Neurological: Negative.   Hematological: Negative.   Psychiatric/Behavioral: Negative.   All other systems reviewed and are negative.      Objective:    BP 116/62   Pulse 74   Temp 98.3 F (36.8 C) (Oral)   Wt 191 lb 4 oz (86.8 kg)   SpO2 93%   BMI 33.88 kg/m    Physical Exam    General:  Well-developed,well-nourished,in no acute distress; alert,appropriate and cooperative throughout examination Head:  normocephalic and atraumatic.   Eyes:  vision grossly intact, PERRL Ears:  R ear normal and L ear normal externally, TMs clear bilaterally Nose:  no external deformity.   Mouth:  good dentition.   Neck:  No deformities, masses, or tenderness noted.  Lungs:  Normal respiratory effort, chest expands symmetrically. Lungs are clear to auscultation, no crackles or wheezes. Heart:  Normal rate and regular rhythm. S1 and S2 normal without gallop, murmur, click, rub or other extra sounds. Extremities:  No clubbing, cyanosis, edema, or deformity noted with normal full range of motion of all joints.   Neurologic:  alert & oriented X3 and gait normal.   Skin:  Intact without suspicious lesions or rashes Psych:  Cognition and judgment appear intact. Alert and cooperative with normal attention span and concentration. No apparent delusions, illusions, hallucinations      Assessment & Plan:   ST elevation myocardial infarction involving left anterior descending (LAD) coronary artery (HCC) No Follow-up on file.

## 2016-12-11 NOTE — Assessment & Plan Note (Signed)
Doing well s/p STEMI. S/p DES. She will likely be on Brilinta for a year. Continue ASA, statin, beta blocker.

## 2016-12-11 NOTE — Patient Instructions (Signed)
Great to see you.  Happy Birthday.  Please keep me updated after you see your heart doctor.

## 2016-12-11 NOTE — Progress Notes (Signed)
Pre visit review using our clinic review tool, if applicable. No additional management support is needed unless otherwise documented below in the visit note. 

## 2016-12-12 ENCOUNTER — Encounter: Payer: Self-pay | Admitting: Internal Medicine

## 2016-12-12 ENCOUNTER — Ambulatory Visit (INDEPENDENT_AMBULATORY_CARE_PROVIDER_SITE_OTHER): Payer: BLUE CROSS/BLUE SHIELD | Admitting: Cardiovascular Disease

## 2016-12-12 VITALS — BP 114/72 | HR 75 | Ht 63.0 in | Wt 190.5 lb

## 2016-12-12 DIAGNOSIS — I2102 ST elevation (STEMI) myocardial infarction involving left anterior descending coronary artery: Secondary | ICD-10-CM

## 2016-12-12 DIAGNOSIS — E782 Mixed hyperlipidemia: Secondary | ICD-10-CM

## 2016-12-12 MED ORDER — TICAGRELOR 90 MG PO TABS: 90.0000 mg | ORAL_TABLET | Freq: Two times a day (BID) | ORAL | 6 refills | Status: DC

## 2016-12-12 NOTE — Patient Instructions (Addendum)
Medication Instructions:   Please take brilinta twice a day With low dose aspirin  Labwork:  No new labs needed  Testing/Procedures:  No further testing at this time  We will place an order for cardiac rehab   I recommend watching educational videos on topics of interest to you at:       [email or web address]  Enter code: HEARTCARE    Follow-Up: It was a pleasure seeing you in the office today. Please call us if you have new issues that need to be addressed before your next appt.  [phone]  Your physician wants you to follow-up in: 3 months with Dr. Saunders Revel You will receive a reminder letter in the mail two months in advance. If you don't receive a letter, please call our office to schedule the follow-up appointment.  If you need a refill on your cardiac medications before your next appointment, please call your pharmacy.

## 2016-12-12 NOTE — Progress Notes (Signed)
Cardiology Office Note  Date:  12/12/2016   ID:  Cassandra, Cox April 08, 1961, MRN [id number]  PCP:  Arnette Norris, MD   Chief Complaint  Patient presents with  . other    New Patient. TCM ED-to-hosp adm. for chest pain due to myocardial ischemia. Had coronary stent intervention/ ECHO. Pt  c/o chest pain, sob, dizziness at times.Reviewed meds with pt verbally.    HPI:  Cassandra Cox is a 56 y.o. female with history of anxiety and hyperlipidemia, who developed acute onset of chest pain 12/02/2016, diagnosed with anterior STEMI , drug-eluting stent placed to her mid LAD, also with mild diffuse proximal LAD disease, echocardiogram confirming normal ejection fraction with no significant wall motion abnormality, who percent to establish care in the [place] office in for follow-up of her coronary disease, recent STEMI  She reports that she feels well with no complaints, She has occasional twinges in her chest but has recovered well She had radial access cardiac catheterization, right wrist has healed well with no significant pain.  She is taking daily low-dose aspirin with Brilinta once a day, was not aware she had to take this twice a day  So far tolerating Lipitor 80 mg daily Numerous questions concerning the statins, risk, benefit Also still in disbelief that she had developed a blockage at such a young age  EKG on today's visit shows normal sinus rhythm with rate 75 bpm, ST and T wave abnormality anterior precordial leads, 1 and aVL  Orthostatics done in the office today showing no significant change in heart rate or blood pressure with standing   PMH:   has a past medical history of Depression; Hyperlipemia [id number]); Renal disorder; and STEMI (ST elevation myocardial infarction) ([place]) (12/02/2016).  PSH:    Past Surgical History:  Procedure Laterality Date  . ABDOMINAL HYSTERECTOMY    . BREAST REDUCTION SURGERY  1990  . CARDIAC CATHETERIZATION N/A 12/02/2016   Procedure:  Coronary Stent Intervention;  Surgeon: Nelva Bush, MD;  Location: [facility];  Service: Cardiovascular;  Laterality: N/A;  . CARDIAC CATHETERIZATION N/A 12/02/2016   Procedure: Left Heart Cath and Coronary Angiography;  Surgeon: Nelva Bush, MD;  Location: [facility];  Service: Cardiovascular;  Laterality: N/A;  . CARDIAC CATHETERIZATION N/A 12/02/2016   Procedure: Intravascular Ultrasound/IVUS;  Surgeon: Nelva Bush, MD;  Location: [facility];  Service: Cardiovascular;  Laterality: N/A;    Current Outpatient Prescriptions  Medication Sig Dispense Refill  . ALPRAZolam (XANAX) 1 MG tablet Take 1 mg by mouth at bedtime as needed.      Marland Kitchen atorvastatin (LIPITOR) 80 MG tablet Take 1 tablet (80 mg total) by mouth daily at 6 PM. 30 tablet 0  . buPROPion (WELLBUTRIN XL) 150 MG 24 hr tablet Take 150 mg by mouth daily.      . Cholecalciferol (VITAMIN D-3) 5000 UNITS TABS Take 1 tablet by mouth daily.      . fish oil-omega-3 fatty acids 1000 MG capsule Take 1 g by mouth daily.      Marland Kitchen FLUoxetine (PROZAC) 20 MG tablet Take 20 mg by mouth daily.      Marland Kitchen levocetirizine (XYZAL) 5 MG tablet Take 1 tablet by mouth daily.  1  . magnesium gluconate (MAGONATE) 500 MG tablet Take 1,000 mg by mouth at bedtime.     . metoprolol succinate (TOPROL-XL) 25 MG 24 hr tablet Take 0.5 tablets (12.5 mg total) by mouth at bedtime. 30 tablet 0  . montelukast (  SINGULAIR) 10 MG tablet Take 10 mg by mouth at bedtime.    . nitroGLYCERIN (NITROSTAT) 0.4 MG SL tablet Place 1 tablet (0.4 mg total) under the tongue every 5 (five) minutes as needed for chest pain. 30 tablet 0  . omeprazole (PRILOSEC) 20 MG capsule Take 20 mg by mouth daily.    . ticagrelor (BRILINTA) 90 MG TABS tablet Take 1 tablet (90 mg total) by mouth 2 (two) times daily. 60 tablet 6  . triamcinolone (NASACORT ALLERGY 24HR) 55 MCG/ACT AERO nasal inhaler Place 2 sprays into the nose daily.    Marland Kitchen zolpidem (AMBIEN CR) 12.5 MG CR  tablet Take 12.5 mg by mouth at bedtime as needed for sleep.     No current facility-administered medications for this visit.      Allergies:   Prednisone and Sulfonamide derivatives   Social History:  The patient  reports that she has never smoked. She has never used smokeless tobacco. She reports that she does not drink alcohol or use drugs.   Family History:   family history includes Atrial fibrillation in her mother; Breast cancer in her maternal aunt; COPD in her father; Diabetes in her maternal grandfather and mother; Heart attack in her mother; Heart disease in her mother; Hyperlipidemia in her father and mother; Hypertension in her father and mother; Stroke in her mother.    Review of Systems: Review of Systems  Constitutional: Negative.   Respiratory: Negative.   Cardiovascular: Negative.   Gastrointestinal: Negative.   Musculoskeletal: Negative.   Neurological: Negative.   Psychiatric/Behavioral: Negative.   All other systems reviewed and are negative.    PHYSICAL EXAM: VS:  BP 114/72 (BP Location: Right Arm, Patient Position: Sitting, Cuff Size: Normal)   Pulse 75   Ht 5\' 3"  (1.6 m)   Wt 190 lb 8 oz (86.4 kg)   BMI 33.75 kg/m  , BMI Body mass index is 33.75 kg/m. GEN: Well nourished, well developed, in no acute distress, obese  HEENT: normal  Neck: no JVD, carotid bruits, or masses Cardiac: RRR; no murmurs, rubs, or gallops,no edema  Respiratory:  clear to auscultation bilaterally, normal work of breathing GI: soft, nontender, nondistended, + BS MS: no deformity or atrophy  Skin: warm and dry, no rash Neuro:  Strength and sensation are intact Psych: euthymic mood, full affect    Recent Labs: 12/02/2016: ALT 25 12/03/2016: TSH 2.411 12/04/2016: BUN 23; Creatinine, Ser 0.66; Hemoglobin 12.2; Magnesium 1.7; Platelets 306; Potassium 3.8; Sodium 135    Lipid Panel Lab Results  Component Value Date   CHOL 325 (H) 12/02/2016   HDL 61 12/02/2016   LDLCALC 206  (H) 12/02/2016   TRIG 292 (H) 12/02/2016      Wt Readings from Last 3 Encounters:  12/12/16 190 lb 8 oz (86.4 kg)  12/11/16 191 lb 4 oz (86.8 kg)  12/02/16 193 lb 12.6 oz (87.9 kg)       ASSESSMENT AND PLAN:  Mixed hyperlipidemia - Plan: EKG 12-Lead Long discussion concerning and benefit of her statin, goal total cholesterol less than 150, LDL less than 70. Repeat lipid panel in 2-3 months time If not at goal, may need to add zetia 10 mg daily We did discuss other statins, symptoms such as myalgias, management if she gets these symptoms We did discuss other options such as repatha/praluent if she is unable to tolerate statins  ST elevation myocardial infarction involving left anterior descending (LAD) coronary artery ([place]) - Plan: EKG 12-Lead Recent catheterization results,  hospital course discussed with her Strongly recommended she increase Brilinta up to twice a day. She was under the impression this was only taking once a day. We did discuss this with Dr. Fletcher Anon who suggested that repeat loading dose of  brilinta was not necessary. We have recommended cardiac rehabilitation as she is anxious concerning her recovery .   Total encounter time more than 45 minutes  Greater than 50% was spent in counseling and coordination of care with the patient   Disposition:   F/U   Months with Dr. Saunders Revel   Orders Placed This Encounter  Procedures  . AMB referral to cardiac rehabilitation  . EKG 12-Lead     Signed, Esmond Plants, M.D., Ph.D. 12/12/2016  [place], [place]

## 2017-01-08 ENCOUNTER — Telehealth: Payer: Self-pay | Admitting: Internal Medicine

## 2017-01-08 MED ORDER — ATORVASTATIN CALCIUM 80 MG PO TABS
80.0000 mg | ORAL_TABLET | Freq: Every day | ORAL | 3 refills | Status: DC
Start: 2017-01-08 — End: 2017-07-17

## 2017-01-08 MED ORDER — METOPROLOL SUCCINATE ER 25 MG PO TB24: 12.5000 mg | ORAL_TABLET | Freq: Every day | ORAL | 3 refills | Status: DC

## 2017-01-08 NOTE — Telephone Encounter (Signed)
Dr. Rockey Situ did not prescribe these is it okay to refill them?

## 2017-01-08 NOTE — Telephone Encounter (Signed)
Dr. Rockey Situ saw once for Dr. Saunders Revel.  Refills sent in.

## 2017-01-08 NOTE — Telephone Encounter (Signed)
°*  STAT* If patient is at the pharmacy, call can be transferred to refill team.   1. Which medications need to be refilled? (please list name of each medication and dose if known)  Lipitor 80 mg Metoprolol 25 mg half a tablet a day  2. Which pharmacy/location (including street and city if local pharmacy) is medication to be sent to? Mail order [facility] through her [place]   3. Do they need a 30 day or 90 day supply?  90 day

## 2017-03-14 ENCOUNTER — Ambulatory Visit (INDEPENDENT_AMBULATORY_CARE_PROVIDER_SITE_OTHER): Payer: BLUE CROSS/BLUE SHIELD | Admitting: Internal Medicine

## 2017-03-14 ENCOUNTER — Encounter: Payer: Self-pay | Admitting: Internal Medicine

## 2017-03-14 VITALS — BP 110/70 | HR 73 | Ht 63.0 in | Wt 186.0 lb

## 2017-03-14 DIAGNOSIS — I2510 Atherosclerotic heart disease of native coronary artery without angina pectoris: Secondary | ICD-10-CM

## 2017-03-14 DIAGNOSIS — I252 Old myocardial infarction: Secondary | ICD-10-CM

## 2017-03-14 DIAGNOSIS — Z79899 Other long term (current) drug therapy: Secondary | ICD-10-CM

## 2017-03-14 DIAGNOSIS — E782 Mixed hyperlipidemia: Secondary | ICD-10-CM | POA: Diagnosis not present

## 2017-03-14 NOTE — Patient Instructions (Signed)
Medication Instructions:  Your physician recommends that you continue on your current medications as directed. Please refer to the Current Medication list given to you today.   Labwork: Your physician recommends that you return for lab work in: [place] (CHOLESTEROL) PROFILE. - You will need to be fasting. DO NOT EAT OR DRINK AFTER MIDNIGHT THE MORNING YOU HAVE THE LAB WORK. - Please go to the [facility]. You will check in at the front desk to the right as you walk into the atrium. Valet Parking is offered if needed.     Testing/Procedures: none  Follow-Up: Your physician wants you to follow-up in: 3 MONTHS WITH DR END. You will receive a reminder letter in the mail two months in advance. If you don't receive a letter, please call our office to schedule the follow-up appointment.   If you need a refill on your cardiac medications before your next appointment, please call your pharmacy.

## 2017-03-14 NOTE — Progress Notes (Signed)
Follow-up Outpatient Visit Date: 03/14/2017  Primary Care Provider: Lucille Passy, MD [place] [id number]  Chief Complaint: Follow-up coronary artery disease s/p STEMI in 11/2016.  HPI:  Ms. Brau is a 56 y.o. year-old female with history of CAD s/p anterior STEMI with DES to the mid LAD in 11/2016, hyperlipidemia, who presents for follow-up of coronary artery disease. Today, Ms. Scrivens reports feeling well. She has noticed occasional chest pain and shortness of breath, predominantly at night. She has some accompanying nausea and associates the discomfort with having eaten as well. She notes that the pain is distinctly different than what she experienced at the time of her MI earlier this year. She has not had any exertional symptoms. She also notes that her stamina is continuing to improve. She denies palpitations but has occasional orthostatic lightheadedness. She has not had any falls. Since our last visit, omeprazole was discontinued at the suggestion of Dr. Berline Lopes at [facility]. Ms. Berman remains compliant with her cardiac medications, including dual antiplatelet therapy with aspirin and ticagrelor. She has not had any significant bleeding.Marland Kitchen  --------------------------------------------------------------------------------------------------  Cardiovascular History & Procedures: Cardiovascular Problems:  Coronary artery disease status post anterior STEMI (11/2016  Risk Factors:  Known CAD and hyperlipidemia  Cath/PCI:  LHC/PCI (12/02/16): LMCA normal. LAD 30% ostial and 99% mid vessel disease. LCx and RCA without significant disease. Successful IVUS-guided PCI to mid LAD with placement of integrity resolute 3.0 x 18 mm drug-eluting stent postdilated with 3.75 mm [place] balloon.  CV Surgery:  None  EP Procedures and Devices:  None  Non-Invasive Evaluation(s):  TTE (12/03/16): Normal LV size with mild LVH. LVEF 55-60% with grade 2 diastolic dysfunction. Left atrial  enlargement. Normal RV size and function. No significant valvular abnormalities.  Recent CV Pertinent Labs: Lab Results  Component Value Date   CHOL 171 03/15/2017   HDL 52 03/15/2017   LDLCALC 101 (H) 03/15/2017   LDLDIRECT 142.2 12/04/2013   TRIG 90 03/15/2017   CHOLHDL 3.3 03/15/2017   INR 0.92 12/02/2016   K 3.8 12/04/2016   MG 1.7 12/04/2016   BUN 23 (H) 12/04/2016   CREATININE 0.66 12/04/2016    Past medical and surgical history were reviewed and updated in EPIC.  Outpatient Encounter Prescriptions as of 03/14/2017  Medication Sig  . ALPRAZolam (XANAX) 1 MG tablet Take 1 mg by mouth at bedtime as needed.    Marland Kitchen atorvastatin (LIPITOR) 80 MG tablet Take 1 tablet (80 mg total) by mouth daily at 6 PM.  . buPROPion (WELLBUTRIN XL) 150 MG 24 hr tablet Take 150 mg by mouth daily.    . Cholecalciferol (VITAMIN D-3) 5000 UNITS TABS Take 1 tablet by mouth daily.    . Cyanocobalamin (VITAMIN B 12 PO) Take 10 mg by mouth once a week.  Marland Kitchen FLUoxetine (PROZAC) 20 MG tablet Take 20 mg by mouth daily.    Marland Kitchen levocetirizine (XYZAL) 5 MG tablet Take 1 tablet by mouth daily.  . magnesium gluconate (MAGONATE) 500 MG tablet Take 1,000 mg by mouth at bedtime.   . metFORMIN (GLUCOPHAGE) 500 MG tablet Take 500 mg by mouth 2 (two) times daily with a meal.  . metoprolol succinate (TOPROL-XL) 25 MG 24 hr tablet Take 0.5 tablets (12.5 mg total) by mouth at bedtime.  . montelukast (SINGULAIR) 10 MG tablet Take 10 mg by mouth at bedtime.  . nitroGLYCERIN (NITROSTAT) 0.4 MG SL tablet Place 1 tablet (0.4 mg total) under the tongue every 5 (five) minutes  as needed for chest pain.  . ticagrelor (BRILINTA) 90 MG TABS tablet Take 1 tablet (90 mg total) by mouth 2 (two) times daily.  Marland Kitchen triamcinolone (NASACORT ALLERGY 24HR) 55 MCG/ACT AERO nasal inhaler Place 2 sprays into the nose daily.  Marland Kitchen zolpidem (AMBIEN CR) 12.5 MG CR tablet Take 12.5 mg by mouth at bedtime as needed for sleep.  . [DISCONTINUED] atorvastatin  (LIPITOR) 80 MG tablet Take 1 tablet (80 mg total) by mouth daily at 6 PM.  . [DISCONTINUED] fish oil-omega-3 fatty acids 1000 MG capsule Take 1 g by mouth daily.    . [DISCONTINUED] metoprolol succinate (TOPROL-XL) 25 MG 24 hr tablet Take 0.5 tablets (12.5 mg total) by mouth at bedtime.  . [DISCONTINUED] omeprazole (PRILOSEC) 20 MG capsule Take 20 mg by mouth daily.   No facility-administered encounter medications on file as of 03/14/2017.     Allergies: Prednisone and Sulfonamide derivatives  Social History   Social History  . Marital status: Divorced    Spouse name: N/A  . Number of children: N/A  . Years of education: N/A   Occupational History  . Not on file.   Social History Main Topics  . Smoking status: Never Smoker  . Smokeless tobacco: Never Used  . Alcohol use No  . Drug use: No  . Sexual activity: Not on file   Other Topics Concern  . Not on file   Social History Narrative  . No narrative on file    Family History  Problem Relation Age of Onset  . Heart disease Mother   . Diabetes Mother   . Stroke Mother   . Heart attack Mother   . Atrial fibrillation Mother   . Hypertension Mother   . Hyperlipidemia Mother   . COPD Father   . Hypertension Father   . Hyperlipidemia Father   . Breast cancer Maternal Aunt   . Diabetes Maternal Grandfather     Review of Systems: A 12-system review of systems was performed and was negative except as noted in the HPI.  --------------------------------------------------------------------------------------------------  Physical Exam: BP 110/70 (BP Location: Left Arm, Patient Position: Sitting, Cuff Size: Normal)   Pulse 73   Ht 5' 3"  (1.6 m)   Wt 186 lb (84.4 kg)   BMI 32.95 kg/m   General:  Overweight woman, seated comfortably in the exam room. HEENT: No conjunctival pallor or scleral icterus.  Moist mucous membranes.  OP clear. Neck: Supple without lymphadenopathy, thyromegaly, JVD, or HJR.  No carotid  bruit. Lungs: Normal work of breathing.  Clear to auscultation bilaterally without wheezes or crackles. Heart: Regular rate and rhythm without murmurs, rubs, or gallops.  Non-displaced PMI. Abd: Bowel sounds present.  Soft, NT/ND without hepatosplenomegaly Ext: No lower extremity edema.  Radial, PT, and DP pulses are 2+ bilaterally. Right radial arteriotomy site is well-healed. Skin: warm and dry without rash  EKG:  Normal sinus rhythm with nonspecific ST/T changes.  Lab Results  Component Value Date   WBC 9.8 12/04/2016   HGB 12.2 12/04/2016   HCT 35.6 12/04/2016   MCV 89.6 12/04/2016   PLT 306 12/04/2016    Lab Results  Component Value Date   NA 135 12/04/2016   K 3.8 12/04/2016   CL 105 12/04/2016   CO2 24 12/04/2016   BUN 23 (H) 12/04/2016   CREATININE 0.66 12/04/2016   GLUCOSE 112 (H) 12/04/2016   ALT 35 03/15/2017    Lab Results  Component Value Date   CHOL 171 03/15/2017  HDL 52 03/15/2017   LDLCALC 101 (H) 03/15/2017   LDLDIRECT 142.2 12/04/2013   TRIG 90 03/15/2017   CHOLHDL 3.3 03/15/2017   --------------------------------------------------------------------------------------------------  ASSESSMENT AND PLAN: Coronary artery disease status post anterior STEMI Ms. Jumper has recovered well from her STEMI and PCI in 11/2016. Her atypical chest pain is not consistent with angina. I question if this could be GI in nature, particularly given recent discontinuation of omeprazole. We will continue aggressive secondary prevention, including dual antiplatelet therapy with aspirin and ticagrelor for at least 12 months from the time of her MI. We will also continue with aggressive lipid therapy. Ms. Wiechman has not enrolled in cardiac rehabilitation and is not interested, as her work hours preclude participation. I encouraged her to begin walking, with a goal of going 30 minutes a day, 5 days a week.  Hyperlipidemia Goal LDL is less than 70; it was measured at 206 at the  time of her MI in January. She is tolerating high intensity statin therapy well. I will have her return at her convenience in the next week for a fasting lipid panel and ALT to ensure appropriate response to statin therapy.  Follow-up: Return to clinic in 3 months.  Nelva Bush, MD 03/15/2017 9:58 PM

## 2017-03-15 ENCOUNTER — Other Ambulatory Visit
Admission: RE | Admit: 2017-03-15 | Discharge: 2017-03-15 | Disposition: A | Discharge status: Home / Self Care | Payer: BLUE CROSS/BLUE SHIELD | Source: Ambulatory Visit | Attending: Internal Medicine | Admitting: Internal Medicine

## 2017-03-15 ENCOUNTER — Encounter: Payer: Self-pay | Admitting: Internal Medicine

## 2017-03-15 DIAGNOSIS — I2102 ST elevation (STEMI) myocardial infarction involving left anterior descending coronary artery: Secondary | ICD-10-CM

## 2017-03-15 DIAGNOSIS — Z79899 Other long term (current) drug therapy: Secondary | ICD-10-CM | POA: Diagnosis present

## 2017-03-15 DIAGNOSIS — E785 Hyperlipidemia, unspecified: Secondary | ICD-10-CM | POA: Insufficient documentation

## 2017-03-15 LAB — LIPID PANEL
Cholesterol: 171 mg/dL (ref 0–200)
HDL: 52 mg/dL (ref >40)
LDL Cholesterol: 101 mg/dL — ABNORMAL HIGH (ref 0–99)
Total CHOL/HDL Ratio: 3.3 RATIO
Triglycerides: 90 mg/dL (ref <150)
VLDL: 18 mg/dL (ref 0–40)

## 2017-03-15 LAB — ALT: ALT: 35 U/L (ref 14–54)

## 2017-03-19 ENCOUNTER — Other Ambulatory Visit: Payer: Self-pay | Admitting: *Deleted

## 2017-03-19 DIAGNOSIS — Z79899 Other long term (current) drug therapy: Secondary | ICD-10-CM

## 2017-03-19 DIAGNOSIS — E782 Mixed hyperlipidemia: Secondary | ICD-10-CM

## 2017-03-19 MED ORDER — EZETIMIBE 10 MG PO TABS: 10.0000 mg | ORAL_TABLET | Freq: Every day | ORAL | 3 refills | Status: DC

## 2017-06-08 ENCOUNTER — Other Ambulatory Visit
Admission: RE | Admit: 2017-06-08 | Discharge: 2017-06-08 | Disposition: A | Discharge status: Home / Self Care | Payer: BLUE CROSS/BLUE SHIELD | Source: Ambulatory Visit | Attending: Internal Medicine | Admitting: Internal Medicine

## 2017-06-08 DIAGNOSIS — E782 Mixed hyperlipidemia: Secondary | ICD-10-CM | POA: Diagnosis not present

## 2017-06-08 DIAGNOSIS — Z79899 Other long term (current) drug therapy: Secondary | ICD-10-CM

## 2017-06-08 LAB — LIPID PANEL
Cholesterol: 135 mg/dL (ref 0–200)
HDL: 55 mg/dL (ref >40)
LDL Cholesterol: 67 mg/dL (ref 0–99)
Total CHOL/HDL Ratio: 2.5 RATIO
Triglycerides: 65 mg/dL (ref <150)
VLDL: 13 mg/dL (ref 0–40)

## 2017-06-12 ENCOUNTER — Encounter: Payer: Self-pay | Admitting: Internal Medicine

## 2017-06-12 ENCOUNTER — Ambulatory Visit (INDEPENDENT_AMBULATORY_CARE_PROVIDER_SITE_OTHER): Payer: BLUE CROSS/BLUE SHIELD | Admitting: Internal Medicine

## 2017-06-12 VITALS — BP 118/68 | HR 80 | Ht 63.0 in | Wt 182.0 lb

## 2017-06-12 DIAGNOSIS — E785 Hyperlipidemia, unspecified: Secondary | ICD-10-CM

## 2017-06-12 DIAGNOSIS — I25118 Atherosclerotic heart disease of native coronary artery with other forms of angina pectoris: Secondary | ICD-10-CM

## 2017-06-12 NOTE — Patient Instructions (Signed)
Medication Instructions:  Your physician has recommended you make the following change in your medication:  1- STOP Metoprolol.  - Call us in 2 weeks to let us know if your shortness of breath and fatigue have improved or not.   Labwork: none  Testing/Procedures: none  Follow-Up: Your physician recommends that you schedule a follow-up appointment in: 4-6 WEEKS WITH DR END.   If you need a refill on your cardiac medications before your next appointment, please call your pharmacy.

## 2017-06-12 NOTE — Progress Notes (Signed)
Follow-up Outpatient Visit Date: 06/12/2017  Primary Care Provider: Lucille Passy, MD [address] [id number]  Chief Complaint: Chest pressure and shortness of breath  HPI:  Ms. Fritts is a 56 y.o. year-old female with history of coronary artery disease status post anterior STEMI in 11/2016, hyperlipidemia, and anxiety, who presents for follow-up of coronary artery disease. I last saw her in May, at which time she described occasional vague chest pain. Since that time, Ms. Appleman feels as though her chest discomfort has become more frequent. She describes a heaviness across her chest that occurs when she feels anxious. It has not radiated into the left arm or jaw, like what she experienced at the time of her MI in January. The discomfort typically resolves with relaxation and deep breathing after about 10 minutes. She has not taken any sublingual nitroglycerin for her last visit.   Ms. Meinhardt also notes occasional shortness of breath when she becomes anxious or exerts herself, such as climbing a flight of stairs. She is concerned that some of her medications (particularly metoprolol) could be contributing to his symptoms as well as the fatigue that she has felt since her MI. She denies edema, palpitations, and lightheadedness. She is compliant with her medications and remains on dual antiplatelet therapy with aspirin and ticagrelor. She denies bleeding.  Ms. Chilcott is still under treatment by a functional medicine specialist in [place]. She brings multiple lab results with her today. She notes that her functional medicine specialist is in the process of trying to wean her from fluoxetine in the attempt to help improve her sleep. She is also on progesterone due to abnormal hormonal levels detected on recent labs.  --------------------------------------------------------------------------------------------------  Cardiovascular History & Procedures: Cardiovascular Problems:  Coronary  artery disease status post anterior STEMI (11/2016  Risk Factors:  Known CAD and hyperlipidemia  Cath/PCI:  LHC/PCI (12/02/16): LMCA normal. LAD 30% ostial and 99% mid vessel disease. LCx and RCA without significant disease. Successful IVUS-guided PCI to mid LAD with placement of integrity resolute 3.0 x 18 mm drug-eluting stent postdilated with 3.75 mm [place] balloon.  CV Surgery:  None  EP Procedures and Devices:  None  Non-Invasive Evaluation(s):  TTE (12/03/16): Normal LV size with mild LVH. LVEF 55-60% with grade 2 diastolic dysfunction. Left atrial enlargement. Normal RV size and function. No significant valvular abnormalities.  Recent CV Pertinent Labs: Lab Results  Component Value Date   CHOL 135 06/08/2017   HDL 55 06/08/2017   LDLCALC 67 06/08/2017   LDLDIRECT 142.2 12/04/2013   TRIG 65 06/08/2017   CHOLHDL 2.5 06/08/2017   INR 0.92 12/02/2016   K 3.8 12/04/2016   MG 1.7 12/04/2016   BUN 23 (H) 12/04/2016   CREATININE 0.66 12/04/2016    Past medical and surgical history were reviewed and updated in EPIC.  Current Meds  Medication Sig  . 5-Hydroxytryptophan (5-HTP) 50 MG CAPS Take 50 mg by mouth 4 (four) times daily.  Marland Kitchen ALPRAZolam (XANAX) 1 MG tablet Take 1 mg by mouth at bedtime as needed.    Marland Kitchen atorvastatin (LIPITOR) 80 MG tablet Take 1 tablet (80 mg total) by mouth daily at 6 PM.  . buPROPion (WELLBUTRIN XL) 150 MG 24 hr tablet Take 150 mg by mouth daily.    . Cholecalciferol (VITAMIN D-3) 5000 UNITS TABS Take 1 tablet by mouth daily.    . Cyanocobalamin (VITAMIN B 12 PO) Take 10 mg by mouth once a week.  . ezetimibe (ZETIA) 10  MG tablet Take 1 tablet (10 mg total) by mouth daily.  Marland Kitchen FLUoxetine (PROZAC) 20 MG tablet Take 20 mg by mouth daily.    Marland Kitchen levocetirizine (XYZAL) 5 MG tablet Take 1 tablet by mouth daily.  . magnesium gluconate (MAGONATE) 500 MG tablet Take 1,000 mg by mouth at bedtime.   . metFORMIN (GLUCOPHAGE) 500 MG tablet Take 500 mg by mouth 2  (two) times daily with a meal.  . metoprolol succinate (TOPROL-XL) 25 MG 24 hr tablet Take 0.5 tablets (12.5 mg total) by mouth at bedtime.  . montelukast (SINGULAIR) 10 MG tablet Take 10 mg by mouth at bedtime.  . nitroGLYCERIN (NITROSTAT) 0.4 MG SL tablet Place 1 tablet (0.4 mg total) under the tongue every 5 (five) minutes as needed for chest pain.  . rizatriptan (MAXALT-MLT) 5 MG disintegrating tablet Take 5 mg by mouth as needed for migraine. May repeat in 2 hours if needed  . ticagrelor (BRILINTA) 90 MG TABS tablet Take 1 tablet (90 mg total) by mouth 2 (two) times daily.  Marland Kitchen triamcinolone (NASACORT ALLERGY 24HR) 55 MCG/ACT AERO nasal inhaler Place 2 sprays into the nose daily.  Marland Kitchen zolpidem (AMBIEN CR) 12.5 MG CR tablet Take 12.5 mg by mouth at bedtime as needed for sleep.    Allergies: Prednisone and Sulfonamide derivatives  Social History   Social History  . Marital status: Divorced    Spouse name: N/A  . Number of children: N/A  . Years of education: N/A   Occupational History  . Not on file.   Social History Main Topics  . Smoking status: Never Smoker  . Smokeless tobacco: Never Used  . Alcohol use No  . Drug use: No  . Sexual activity: Not on file   Other Topics Concern  . Not on file   Social History Narrative  . No narrative on file    Family History  Problem Relation Age of Onset  . Heart disease Mother   . Diabetes Mother   . Stroke Mother   . Heart attack Mother   . Atrial fibrillation Mother   . Hypertension Mother   . Hyperlipidemia Mother   . COPD Father   . Hypertension Father   . Hyperlipidemia Father   . Breast cancer Maternal Aunt   . Diabetes Maternal Grandfather     Review of Systems: A 12-system review of systems was performed and was negative except as noted in the HPI.  --------------------------------------------------------------------------------------------------  Physical Exam: BP 118/68 (BP Location: Left Arm, Patient  Position: Sitting, Cuff Size: Normal)   Pulse 80   Ht 5' 3"  (1.6 m)   Wt 182 lb (82.6 kg)   BMI 32.24 kg/m   General:  Obese woman, seated comfortably in the exam room. HEENT: No conjunctival pallor or scleral icterus. Moist mucous membranes.  OP clear. Neck: Supple without lymphadenopathy, thyromegaly, JVD, or HJR. No carotid bruit. Lungs: Normal work of breathing. Clear to auscultation bilaterally without wheezes or crackles. Heart: Regular rate and rhythm without murmurs, rubs, or gallops. Non-displaced PMI. Abd: Bowel sounds present. Soft, NT/ND without hepatosplenomegaly Ext: No lower extremity edema. Radial, PT, and DP pulses are 2+ bilaterally. Skin: Warm and dry without rash.  EKG:  Normal sinus rhythm with nonspecific ST/T changes. QTC is slightly longer than on prior tracing. Nonspecific ST changes are minimally more pronounced compared with 2017/03/17.  Lab Results  Component Value Date   WBC 9.8 12/04/2016   HGB 12.2 12/04/2016   HCT 35.6 12/04/2016  MCV 89.6 12/04/2016   PLT 306 12/04/2016    Lab Results  Component Value Date   NA 135 12/04/2016   K 3.8 12/04/2016   CL 105 12/04/2016   CO2 24 12/04/2016   BUN 23 (H) 12/04/2016   CREATININE 0.66 12/04/2016   GLUCOSE 112 (H) 12/04/2016   ALT 35 03/15/2017    Lab Results  Component Value Date   CHOL 135 06/08/2017   HDL 55 06/08/2017   LDLCALC 67 06/08/2017   LDLDIRECT 142.2 12/04/2013   TRIG 65 06/08/2017   CHOLHDL 2.5 06/08/2017    --------------------------------------------------------------------------------------------------  ASSESSMENT AND PLAN: Coronary artery disease with stable angina Ms. Yera had been recovering well from her MI but now notes intermittent chest pains when she becomes anxious. She also has some exertional shortness of breath and fatigue. I reviewed her catheterization films from January. She did not have significant disease in the LCx and RCA. However, it is conceivable that D2,  which was jailed by the stent, could have effected. In-stent restenosis is also possibility, though it seems somewhat soon for this. We have discussed further evaluation with noninvasive testing versus medical therapy; we have agreed to defer testing at this time. I have asked Ms. Boeder to stop her metoprolol for about 2 weeks to see if this improves some of her symptoms, particularly the dyspnea and fatigue. She will contact us to let us know how she is feeling. If her symptoms do improve, we will consider a trial of an alternative blocker. Otherwise, we will restart metoprolol and consider stress testing and adding an antianginal agent such as isosorbide mononitrate or ranolazine. We may also need to consider switching to clopidogrel, in case ticagrelor is precipitating some of her dyspnea.  Hyperlipidemia Most recent lipid panel revealed an LDL of 67, which is at goal. We will continue with atorvastatin 80 mg daily and ezetimibe 10 mg daily.  Follow-up: Return to clinic in 4-6 weeks.  Nelva Bush, MD 06/12/2017 3:38 PM

## 2017-07-16 NOTE — Progress Notes (Signed)
Follow-up Outpatient Visit Date: 07/17/2017  Primary Care Provider: Lucille Passy, MD [place] [address]  Chief Complaint: Follow-up coronary artery disease  HPI:  Cassandra Cox is a 56 y.o. year-old female with history of coronary artery disease status post anterior STEMI in 11/2016, hyperlipidemia, and anxiety, who presents for follow-up of coronary artery disease. I last saw her about a month ago, at which time she noted fatigue and occasional chest discomfort. We agreed to discontinue metoprolol, which has improved her energy quite a bit. She notices mild chest tightness when "rushing or anxious." She has not needed to use any nitroglycerin. She also notes mild exertional dyspnea when going up stairs or walking up an incline. Overall, this is stable to improved compared with our last visit. She remains on DAPT with aspirin and ticagrelor without significant bleeding. She denies palpitations, lightheadedness, and edema. She has not participated in cardiac rehab due to her work schedule. She has been trying to lose weight through diet and activity at home and work.  --------------------------------------------------------------------------------------------------  Cardiovascular History & Procedures: Cardiovascular Problems:  Coronary artery disease status post anterior STEMI (11/2016)  Risk Factors:  Known CAD and hyperlipidemia  Cath/PCI:  LHC/PCI (12/02/16): LMCA normal. LAD 30% ostial and 99% mid vessel disease. LCx and RCA without significant disease. Successful IVUS-guided PCI to mid LAD with placement of integrity resolute 3.0 x 18 mm drug-eluting stent postdilated with 3.75 mm [place] balloon.  CV Surgery:  None  EP Procedures and Devices:  None  Non-Invasive Evaluation(s):  TTE (12/03/16): Normal LV size with mild LVH. LVEF 55-60% with grade 2 diastolic dysfunction. Left atrial enlargement. Normal RV size and function. No significant valvular  abnormalities.  Recent CV Pertinent Labs: Lab Results  Component Value Date   CHOL 135 06/08/2017   HDL 55 06/08/2017   LDLCALC 67 06/08/2017   LDLDIRECT 142.2 12/04/2013   TRIG 65 06/08/2017   CHOLHDL 2.5 06/08/2017   INR 0.92 12/02/2016   K 3.8 12/04/2016   MG 1.7 12/04/2016   BUN 23 (H) 12/04/2016   CREATININE 0.66 12/04/2016   Past medical and surgical history were reviewed and updated in EPIC.   Current Meds  Medication Sig  . 5-Hydroxytryptophan (5-HTP) 50 MG CAPS Take 50 mg by mouth 4 (four) times daily.  Marland Kitchen ALPRAZolam (XANAX) 1 MG tablet Take 1 mg by mouth at bedtime as needed.    Marland Kitchen atorvastatin (LIPITOR) 80 MG tablet Take 1 tablet (80 mg total) by mouth daily at 6 PM.  . buPROPion (WELLBUTRIN XL) 150 MG 24 hr tablet Take 150 mg by mouth daily.    . Cholecalciferol (VITAMIN D-3) 5000 UNITS TABS Take 1 tablet by mouth daily.    . Cyanocobalamin (VITAMIN B 12 PO) Take 10 mg by mouth once a week.  . ezetimibe (ZETIA) 10 MG tablet Take 1 tablet (10 mg total) by mouth daily.  . magnesium gluconate (MAGONATE) 500 MG tablet Take 1,000 mg by mouth at bedtime.   . metFORMIN (GLUCOPHAGE) 500 MG tablet Take 500 mg by mouth 2 (two) times daily with a meal.  . montelukast (SINGULAIR) 10 MG tablet Take 10 mg by mouth at bedtime.  . nitroGLYCERIN (NITROSTAT) 0.4 MG SL tablet Place 1 tablet (0.4 mg total) under the tongue every 5 (five) minutes as needed for chest pain.  . rizatriptan (MAXALT-MLT) 5 MG disintegrating tablet Take 5 mg by mouth as needed for migraine. May repeat in 2 hours if needed  .  ticagrelor (BRILINTA) 90 MG TABS tablet Take 1 tablet (90 mg total) by mouth 2 (two) times daily.  Marland Kitchen triamcinolone (NASACORT ALLERGY 24HR) 55 MCG/ACT AERO nasal inhaler Place 2 sprays into the nose daily.  Marland Kitchen zolpidem (AMBIEN CR) 12.5 MG CR tablet Take 12.5 mg by mouth at bedtime as needed for sleep.    Allergies: Prednisone and Sulfonamide derivatives  Social History   Social History    . Marital status: Divorced    Spouse name: N/A  . Number of children: N/A  . Years of education: N/A   Occupational History  . Not on file.   Social History Main Topics  . Smoking status: Never Smoker  . Smokeless tobacco: Never Used  . Alcohol use No  . Drug use: No  . Sexual activity: Not on file   Other Topics Concern  . Not on file   Social History Narrative  . No narrative on file    Family History  Problem Relation Age of Onset  . Heart disease Mother   . Diabetes Mother   . Stroke Mother   . Heart attack Mother   . Atrial fibrillation Mother   . Hypertension Mother   . Hyperlipidemia Mother   . COPD Father   . Hypertension Father   . Hyperlipidemia Father   . Breast cancer Maternal Aunt   . Diabetes Maternal Grandfather     Review of Systems: A 12-system review of systems was performed and was negative except as noted in the HPI.  --------------------------------------------------------------------------------------------------  Physical Exam: BP 120/80 (BP Location: Left Arm, Patient Position: Sitting, Cuff Size: Normal)   Pulse 89   Ht 5' 3"  (1.6 m)   Wt 176 lb 8 oz (80.1 kg)   BMI 31.27 kg/m   General:  Obese woman, seated comfortably in the exam room. HEENT: No conjunctival pallor or scleral icterus.  Moist mucous membranes.  OP clear. Neck: Supple without lymphadenopathy, thyromegaly, JVD, or HJR. Lungs: Normal work of breathing.  Clear to auscultation bilaterally without wheezes or crackles. Heart: Regular rate and rhythm without murmurs or rubs. Unable to assess PMI due to body habitus. Abd: Bowel sounds present.  Soft, NT/ND without hepatosplenomegaly Ext: No lower extremity edema.  Radial and pedal pulses 2+ bilaterally. Skin: Warm and dry without rash.  Lab Results  Component Value Date   WBC 9.8 12/04/2016   HGB 12.2 12/04/2016   HCT 35.6 12/04/2016   MCV 89.6 12/04/2016   PLT 306 12/04/2016    Lab Results  Component Value Date    NA 135 12/04/2016   K 3.8 12/04/2016   CL 105 12/04/2016   CO2 24 12/04/2016   BUN 23 (H) 12/04/2016   CREATININE 0.66 12/04/2016   GLUCOSE 112 (H) 12/04/2016   ALT 35 03/15/2017    Lab Results  Component Value Date   CHOL 135 06/08/2017   HDL 55 06/08/2017   LDLCALC 67 06/08/2017   LDLDIRECT 142.2 12/04/2013   TRIG 65 06/08/2017   CHOLHDL 2.5 06/08/2017   --------------------------------------------------------------------------------------------------  ASSESSMENT AND PLAN: Coronary artery disease with stable angina Overall, Ms. Mckey has recovered well from her STEMI in 11/2016. She notes mild chest discomfort when rushing or anxious. At the time of cath, her only significant disease was in the LAD. Her symptoms could be related to small jailed diagonal branch or microvascular dysfunction. We discussed options for medical therapy and have agreed to a trial of bisoprolol 5 mg daily (she noted fatigue with metoprolol). If symptoms  persist, we may need to consider stress testing and additional medical therapy (isosorbide mononitrate or ranolazine). Continue with DAPT through at least Aamirah Salmi of 11/2017. I congratulated Ms. Ysidro Evert on her weigh loss and encouraged her to continue with her lifestyle modifications.  Hyperlipidemia Most recent LDL in August was 80, which is at goal. We will continue with atorvastatin 80 mg daily.  Follow-up: Return to clinic in 6 months, sooner if symptoms do not improve of she does not tolerate bisoprolol.  07/17/2017 3:51 PM

## 2017-07-17 ENCOUNTER — Ambulatory Visit (INDEPENDENT_AMBULATORY_CARE_PROVIDER_SITE_OTHER): Payer: BLUE CROSS/BLUE SHIELD | Admitting: Internal Medicine

## 2017-07-17 ENCOUNTER — Encounter: Payer: Self-pay | Admitting: Internal Medicine

## 2017-07-17 VITALS — BP 120/80 | HR 89 | Ht 63.0 in | Wt 176.5 lb

## 2017-07-17 DIAGNOSIS — I25118 Atherosclerotic heart disease of native coronary artery with other forms of angina pectoris: Secondary | ICD-10-CM

## 2017-07-17 DIAGNOSIS — E785 Hyperlipidemia, unspecified: Secondary | ICD-10-CM

## 2017-07-17 MED ORDER — ATORVASTATIN CALCIUM 80 MG PO TABS: 80.0000 mg | ORAL_TABLET | Freq: Every day | ORAL | 3 refills | Status: DC

## 2017-07-17 MED ORDER — TICAGRELOR 90 MG PO TABS
90.0000 mg | ORAL_TABLET | Freq: Two times a day (BID) | ORAL | 3 refills | Status: DC
Start: 2017-07-17 — End: 2019-07-24

## 2017-07-17 MED ORDER — BISOPROLOL FUMARATE 5 MG PO TABS: 5.0000 mg | ORAL_TABLET | Freq: Every day | ORAL | 3 refills | Status: DC

## 2017-07-17 NOTE — Patient Instructions (Signed)
Medication Instructions:  Your physician has recommended you make the following change in your medication:  1- START Bisoprolol 5 mg (1 tablet) by mouth once a day.   Labwork: NONE  Testing/Procedures: NONE  Follow-Up: Your physician wants you to follow-up in: 6 MONTHS WITH DR END. You will receive a reminder letter in the mail two months in advance. If you don't receive a letter, please call our office to schedule the follow-up appointment.   If you need a refill on your cardiac medications before your next appointment, please call your pharmacy.

## 2017-07-18 DIAGNOSIS — I2510 Atherosclerotic heart disease of native coronary artery without angina pectoris: Secondary | ICD-10-CM | POA: Insufficient documentation

## 2017-07-18 DIAGNOSIS — I25118 Atherosclerotic heart disease of native coronary artery with other forms of angina pectoris: Secondary | ICD-10-CM | POA: Insufficient documentation

## 2017-08-27 ENCOUNTER — Other Ambulatory Visit (INDEPENDENT_AMBULATORY_CARE_PROVIDER_SITE_OTHER): Payer: BLUE CROSS/BLUE SHIELD

## 2017-08-27 ENCOUNTER — Encounter: Payer: Self-pay | Admitting: Obstetrics and Gynecology

## 2017-08-27 ENCOUNTER — Ambulatory Visit (INDEPENDENT_AMBULATORY_CARE_PROVIDER_SITE_OTHER): Payer: BLUE CROSS/BLUE SHIELD | Admitting: Obstetrics and Gynecology

## 2017-08-27 VITALS — BP 130/74 | HR 76 | Ht 63.0 in | Wt 180.0 lb

## 2017-08-27 DIAGNOSIS — R102 Pelvic and perineal pain: Secondary | ICD-10-CM

## 2017-08-27 NOTE — Progress Notes (Signed)
Chief Complaint  Patient presents with  . Pelvic Pain    Rt side mostly, Somtimes left side x 1 wk    HPI:      [name] is a 56 y.o. No obstetric history on file. who LMP was No LMP recorded. Patient has had a hysterectomy., presents today for NP pelvic pain sx.  Pain is achy and intermittent for the past wk, usually Lt side, sometimes Rt. She denies any aggrav/allev factors. Pain doesn't affect her sleep. She hasn't taken any meds for sx because she can only take tylenol with current meds and hasn't done that. No GI sx, vag sx, urin sx. No fevers. Has noted low back pain. She also felt a hard area just above her TAH scar RT side when showering. Area is uncomfortable with palpation and causes aching.   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 Doesn't do any exercise on her own.  She is sex active, no dyspareunia. She is s/p TAH. No vag bleeding.  Past due for annual.   Past Medical History:  Diagnosis Date  . Coronary artery disease   . Depression   . Hyperlipemia \\[id number] \  . Kidney stone 08/2016  . Renal disorder   . STEMI (ST elevation myocardial infarction) ([place]) 12/02/2016  . Vitamin B 12 deficiency     Past Surgical History:  Procedure Laterality Date  . ABDOMINAL HYSTERECTOMY  2003   TAH due to adenomyosis  . BREAST REDUCTION SURGERY  1990  . CARDIAC CATHETERIZATION N/A 12/02/2016   Procedure: Coronary Stent Intervention;  Surgeon: Nelva Bush, MD;  Location: [facility];  Service: Cardiovascular;  Laterality: N/A;  . CARDIAC CATHETERIZATION N/A 12/02/2016   Procedure: Left Heart Cath and Coronary Angiography;  Surgeon: Nelva Bush, MD;  Location: [facility];  Service: Cardiovascular;  Laterality: N/A;  . CARDIAC CATHETERIZATION N/A 12/02/2016   Procedure: Intravascular Ultrasound/IVUS;  Surgeon: Nelva Bush, MD;  Location: [facility];  Service: Cardiovascular;  Laterality: N/A;  . CORONARY STENT  INTERVENTION      Family History  Problem Relation Age of Onset  . Heart disease Mother   . Diabetes Mother   . Stroke Mother   . Heart attack Mother   . Atrial fibrillation Mother   . Hypertension Mother   . Hyperlipidemia Mother   . COPD Father   . Hypertension Father   . Hyperlipidemia Father   . Breast cancer Maternal Aunt   . Diabetes Maternal Grandfather     Social History   Social History  . Marital status: Divorced    Spouse name: N/A  . Number of children: N/A  . Years of education: N/A   Occupational History  . Not on file.   Social History Main Topics  . Smoking status: Never Smoker  . Smokeless tobacco: Never Used  . Alcohol use No  . Drug use: No  . Sexual activity: Yes   Other Topics Concern  . Not on file   Social History Narrative  . No narrative on file     Current Outpatient Prescriptions:  .  5-Hydroxytryptophan (5-HTP) 50 MG CAPS, Take 50 mg by mouth 4 (four) times daily., Disp: , Rfl:  .  ALPRAZolam (XANAX) 1 MG tablet, Take 1 mg by mouth at bedtime as needed.  , Disp: , Rfl:  .  aspirin EC 81 MG tablet, Take 81 mg by mouth daily., Disp: , Rfl:  .  atorvastatin (LIPITOR) 80 MG tablet, Take 1 tablet (80 mg total) by mouth daily at 6 PM., Disp: 90 tablet, Rfl: 3 .  bisoprolol (ZEBETA) 5 MG tablet, Take 1 tablet (5 mg total) by mouth daily., Disp: 90 tablet, Rfl: 3 .  buPROPion (WELLBUTRIN XL) 150 MG 24 hr tablet, Take 150 mg by mouth daily.  , Disp: , Rfl:  .  Cholecalciferol (VITAMIN D-3) 5000 UNITS TABS, Take 1 tablet by mouth daily.  , Disp: , Rfl:  .  Cyanocobalamin (VITAMIN B 12 PO), Take 10 mg by mouth once a week., Disp: , Rfl:  .  magnesium gluconate (MAGONATE) 500 MG tablet, Take 1,000 mg by mouth at bedtime. , Disp: , Rfl:  .  metFORMIN (GLUCOPHAGE) 500 MG tablet, Take 500 mg by mouth 2 (two) times daily with a meal., Disp: , Rfl:  .  montelukast (SINGULAIR) 10 MG tablet, Take 10 mg by mouth at bedtime., Disp: , Rfl:  .   nitroGLYCERIN (NITROSTAT) 0.4 MG SL tablet, Place 1 tablet (0.4 mg total) under the tongue every 5 (five) minutes as needed for chest pain., Disp: 30 tablet, Rfl: 0 .  rizatriptan (MAXALT-MLT) 5 MG disintegrating tablet, Take 5 mg by mouth as needed for migraine. May repeat in 2 hours if needed, Disp: , Rfl:  .  ticagrelor (BRILINTA) 90 MG TABS tablet, Take 1 tablet (90 mg total) by mouth 2 (two) times daily., Disp: 180 tablet, Rfl: 3 .  triamcinolone (NASACORT ALLERGY 24HR) 55 MCG/ACT AERO nasal inhaler, Place 2 sprays into the nose daily., Disp: , Rfl:  .  zolpidem (AMBIEN CR) 12.5 MG CR tablet, Take 12.5 mg by mouth at bedtime as needed for sleep., Disp: , Rfl:  .  ezetimibe (ZETIA) 10 MG tablet, Take 1 tablet (10 mg total) by mouth daily., Disp: 90 tablet, Rfl: 3   ROS:  Review of Systems  Constitutional: Negative for fatigue, fever and unexpected weight change.  Respiratory: Negative for cough, shortness of breath and wheezing.   Cardiovascular: Negative for chest pain, palpitations and leg swelling.  Gastrointestinal: Negative for blood in stool, constipation, diarrhea, nausea and vomiting.  Endocrine: Negative for cold intolerance, heat intolerance and polyuria.  Genitourinary: Positive for pelvic pain. Negative for dyspareunia, dysuria, flank pain, frequency, genital sores, hematuria, menstrual problem, urgency, vaginal bleeding, vaginal discharge and vaginal pain.  Musculoskeletal: Positive for back pain. Negative for joint swelling and myalgias.  Skin: Negative for rash.  Neurological: Negative for dizziness, syncope, light-headedness, numbness and headaches.  Hematological: Negative for adenopathy.  Psychiatric/Behavioral: Negative for agitation, confusion, sleep disturbance and suicidal ideas. The patient is not nervous/anxious.      OBJECTIVE:   Vitals:  BP 130/74 (BP Location: Left Arm, Patient Position: Sitting, Cuff Size: Normal)   Pulse 76   Ht 5\' 3"  (1.6 m)   Wt 180  lb (81.6 kg)   BMI 31.89 kg/m   Physical Exam  Constitutional: She is oriented to person, place, and time and well-developed, well-nourished, and in no distress. Vital signs are normal.  Abdominal: Normal appearance. There is tenderness in the right lower quadrant and suprapubic area. There is no rebound and no CVA tenderness.  NO MASSES PALPATED ON TAH SCAR  Genitourinary: Vagina normal, left adnexa normal and vulva normal. Right adnexum displays tenderness. Right adnexum displays no mass. Left adnexum displays no mass and no tenderness. Vulva exhibits no erythema, no exudate, no lesion, no rash and no tenderness. Vagina exhibits no lesion.  Musculoskeletal:  Lumbar back: She exhibits tenderness. She exhibits no bony tenderness, no edema, no deformity and no pain.  Neurological: She is oriented to person, place, and time.  Psychiatric: Affect and judgment normal.  Vitals reviewed.   Results: GYN U/S-->   Assessment/Plan: Pelvic pain - Neg GYN u/s. Question musculoskeletal given sx hx. Try heating pad/stretch/core strengthening exercises. F/u prn. - Plan: US PELVIS TRANSVANGINAL NON-OB (TV ONLY)   Return if symptoms worsen or fail to improve.  Bentleigh Stankus B. Henretter Piekarski, PA-C 08/27/2017 5:05 PM

## 2017-09-14 ENCOUNTER — Observation Stay (HOSPITAL_BASED_OUTPATIENT_CLINIC_OR_DEPARTMENT_OTHER): Payer: BLUE CROSS/BLUE SHIELD

## 2017-09-14 ENCOUNTER — Telehealth: Payer: Self-pay | Admitting: *Deleted

## 2017-09-14 ENCOUNTER — Emergency Department: Payer: BLUE CROSS/BLUE SHIELD

## 2017-09-14 ENCOUNTER — Observation Stay
Admission: EM | Admit: 2017-09-14 | Discharge: 2017-09-14 | Disposition: A | Discharge status: Home / Self Care | Payer: BLUE CROSS/BLUE SHIELD | Attending: Internal Medicine | Admitting: Internal Medicine

## 2017-09-14 ENCOUNTER — Observation Stay (HOSPITAL_BASED_OUTPATIENT_CLINIC_OR_DEPARTMENT_OTHER)
Admit: 2017-09-14 | Discharge: 2017-09-14 | Disposition: A | Discharge status: Home / Self Care | Payer: BLUE CROSS/BLUE SHIELD | Attending: Internal Medicine | Admitting: Internal Medicine

## 2017-09-14 ENCOUNTER — Encounter: Payer: Self-pay | Admitting: Emergency Medicine

## 2017-09-14 ENCOUNTER — Other Ambulatory Visit: Payer: Self-pay

## 2017-09-14 DIAGNOSIS — I25110 Atherosclerotic heart disease of native coronary artery with unstable angina pectoris: Principal | ICD-10-CM | POA: Insufficient documentation

## 2017-09-14 DIAGNOSIS — I252 Old myocardial infarction: Secondary | ICD-10-CM | POA: Diagnosis not present

## 2017-09-14 DIAGNOSIS — F329 Major depressive disorder, single episode, unspecified: Secondary | ICD-10-CM | POA: Diagnosis not present

## 2017-09-14 DIAGNOSIS — Z882 Allergy status to sulfonamides status: Secondary | ICD-10-CM | POA: Insufficient documentation

## 2017-09-14 DIAGNOSIS — R079 Chest pain, unspecified: Secondary | ICD-10-CM

## 2017-09-14 DIAGNOSIS — Z79899 Other long term (current) drug therapy: Secondary | ICD-10-CM | POA: Diagnosis not present

## 2017-09-14 DIAGNOSIS — M542 Cervicalgia: Secondary | ICD-10-CM | POA: Diagnosis not present

## 2017-09-14 DIAGNOSIS — I200 Unstable angina: Secondary | ICD-10-CM | POA: Diagnosis present

## 2017-09-14 DIAGNOSIS — E782 Mixed hyperlipidemia: Secondary | ICD-10-CM | POA: Diagnosis not present

## 2017-09-14 DIAGNOSIS — Z7902 Long term (current) use of antithrombotics/antiplatelets: Secondary | ICD-10-CM | POA: Insufficient documentation

## 2017-09-14 DIAGNOSIS — E538 Deficiency of other specified B group vitamins: Secondary | ICD-10-CM | POA: Diagnosis not present

## 2017-09-14 DIAGNOSIS — Z888 Allergy status to other drugs, medicaments and biological substances status: Secondary | ICD-10-CM | POA: Insufficient documentation

## 2017-09-14 DIAGNOSIS — Z7984 Long term (current) use of oral hypoglycemic drugs: Secondary | ICD-10-CM | POA: Diagnosis not present

## 2017-09-14 DIAGNOSIS — Z87442 Personal history of urinary calculi: Secondary | ICD-10-CM | POA: Insufficient documentation

## 2017-09-14 DIAGNOSIS — E785 Hyperlipidemia, unspecified: Secondary | ICD-10-CM | POA: Insufficient documentation

## 2017-09-14 DIAGNOSIS — F419 Anxiety disorder, unspecified: Secondary | ICD-10-CM | POA: Insufficient documentation

## 2017-09-14 DIAGNOSIS — Z90710 Acquired absence of both cervix and uterus: Secondary | ICD-10-CM | POA: Insufficient documentation

## 2017-09-14 DIAGNOSIS — Z23 Encounter for immunization: Secondary | ICD-10-CM | POA: Insufficient documentation

## 2017-09-14 DIAGNOSIS — I25118 Atherosclerotic heart disease of native coronary artery with other forms of angina pectoris: Secondary | ICD-10-CM | POA: Diagnosis not present

## 2017-09-14 DIAGNOSIS — Z955 Presence of coronary angioplasty implant and graft: Secondary | ICD-10-CM

## 2017-09-14 DIAGNOSIS — Z8249 Family history of ischemic heart disease and other diseases of the circulatory system: Secondary | ICD-10-CM | POA: Insufficient documentation

## 2017-09-14 DIAGNOSIS — E559 Vitamin D deficiency, unspecified: Secondary | ICD-10-CM | POA: Insufficient documentation

## 2017-09-14 DIAGNOSIS — Z7982 Long term (current) use of aspirin: Secondary | ICD-10-CM | POA: Diagnosis not present

## 2017-09-14 HISTORY — DX: Other ill-defined heart diseases: I51.89

## 2017-09-14 LAB — LIPID PANEL
Cholesterol: 138 mg/dL (ref 0–200)
HDL: 60 mg/dL (ref >40)
LDL Cholesterol: 69 mg/dL (ref 0–99)
Total CHOL/HDL Ratio: 2.3 RATIO
Triglycerides: 43 mg/dL (ref <150)
VLDL: 9 mg/dL (ref 0–40)

## 2017-09-14 LAB — NM MYOCAR MULTI W/SPECT W/WALL MOTION / EF
Estimated workload: 1 METS
Exercise duration (min): 0 min
Exercise duration (sec): 0 s
LV dias vol: 59 mL (ref 46–106)
LV sys vol: 22 mL
MPHR: 163 {beats}/min
Peak HR: 98 {beats}/min
Percent HR: 60 %
Rest HR: 67 {beats}/min
SDS: 0
SRS: 0
SSS: 0
TID: 1.1

## 2017-09-14 LAB — TROPONIN I
Troponin I: <0.03 ng/mL (ref <0.03)
Troponin I: <0.03 ng/mL (ref <0.03)

## 2017-09-14 LAB — CBC
HCT: 37.1 % (ref 35.0–47.0)
Hemoglobin: 12.3 g/dL (ref 12.0–16.0)
MCH: 31.3 pg (ref 26.0–34.0)
MCHC: 33.2 g/dL (ref 32.0–36.0)
MCV: 94.3 fL (ref 80.0–100.0)
Platelets: 315 10*3/uL (ref 150–440)
RBC: 3.94 MIL/uL (ref 3.80–5.20)
RDW: 13.8 % (ref 11.5–14.5)
WBC: 10.2 10*3/uL (ref 3.6–11.0)

## 2017-09-14 LAB — ECHOCARDIOGRAM COMPLETE
Height: 63 in
Weight: 2902.4 oz

## 2017-09-14 LAB — COMPREHENSIVE METABOLIC PANEL
ALT: 29 U/L (ref 14–54)
AST: 28 U/L (ref 15–41)
Albumin: 4.2 g/dL (ref 3.5–5.0)
Alkaline Phosphatase: 78 U/L (ref 38–126)
Anion gap: 10 (ref 5–15)
BUN: 18 mg/dL (ref 6–20)
CO2: 23 mmol/L (ref 22–32)
Calcium: 9.3 mg/dL (ref 8.9–10.3)
Chloride: 104 mmol/L (ref 101–111)
Creatinine, Ser: 0.74 mg/dL (ref 0.44–1.00)
GFR calc Af Amer: >60 mL/min (ref >60)
GFR calc non Af Amer: >60 mL/min (ref >60)
Glucose, Bld: 91 mg/dL (ref 65–99)
Potassium: 3.5 mmol/L (ref 3.5–5.1)
Sodium: 137 mmol/L (ref 135–145)
Total Bilirubin: 0.6 mg/dL (ref 0.3–1.2)
Total Protein: 7.5 g/dL (ref 6.5–8.1)

## 2017-09-14 LAB — PROTIME-INR
INR: 1.03
Prothrombin Time: 13.4 seconds (ref 11.4–15.2)

## 2017-09-14 LAB — APTT
aPTT: >160 seconds (ref 24–36)
aPTT: 50 seconds — ABNORMAL HIGH (ref 24–36)

## 2017-09-14 LAB — HEPARIN LEVEL (UNFRACTIONATED): Heparin Unfractionated: <0.1 IU/mL — ABNORMAL LOW (ref 0.30–0.70)

## 2017-09-14 MED ORDER — ALPRAZOLAM 1 MG PO TABS: 1.0000 mg | ORAL_TABLET | Freq: Every evening | ORAL | Status: DC | PRN

## 2017-09-14 MED ORDER — MONTELUKAST SODIUM 10 MG PO TABS: 10.0000 mg | ORAL_TABLET | Freq: Every day | ORAL | Status: DC

## 2017-09-14 MED ORDER — TRIAMCINOLONE ACETONIDE 55 MCG/ACT NA AERO
2.0000 | INHALATION_SPRAY | Freq: Every day | NASAL | Status: DC
  Filled 2017-09-14: qty 21.6

## 2017-09-14 MED ORDER — ATORVASTATIN CALCIUM 20 MG PO TABS: 80.0000 mg | ORAL_TABLET | Freq: Every day | ORAL | Status: DC

## 2017-09-14 MED ORDER — 5-HTP 50 MG PO CAPS: 50.0000 mg | ORAL_CAPSULE | Freq: Four times a day (QID) | ORAL | Status: DC

## 2017-09-14 MED ORDER — REGADENOSON 0.4 MG/5ML IV SOLN
0.4000 mg | Freq: Once | INTRAVENOUS | Status: AC
  Administered 2017-09-14: 0.4 mg via INTRAVENOUS
  Filled 2017-09-14: qty 5

## 2017-09-14 MED ORDER — TICAGRELOR 90 MG PO TABS
90.0000 mg | ORAL_TABLET | Freq: Two times a day (BID) | ORAL | Status: DC
  Administered 2017-09-14: 90 mg via ORAL
  Filled 2017-09-14: qty 1

## 2017-09-14 MED ORDER — NITROGLYCERIN 2 % TD OINT
0.5000 [in_us] | TOPICAL_OINTMENT | Freq: Once | TRANSDERMAL | Status: AC
  Administered 2017-09-14: 0.5 [in_us] via TOPICAL
  Filled 2017-09-14: qty 1

## 2017-09-14 MED ORDER — ENOXAPARIN SODIUM 40 MG/0.4ML ~~LOC~~ SOLN: 40.0000 mg | SUBCUTANEOUS | Status: DC

## 2017-09-14 MED ORDER — HEPARIN SODIUM (PORCINE) 5000 UNIT/ML IJ SOLN
4000.0000 [IU] | Freq: Once | INTRAMUSCULAR | Status: AC
  Administered 2017-09-14: 4000 [IU] via INTRAVENOUS
  Filled 2017-09-14: qty 1

## 2017-09-14 MED ORDER — NITROGLYCERIN 0.4 MG SL SUBL: 0.4000 mg | SUBLINGUAL_TABLET | SUBLINGUAL | Status: DC | PRN

## 2017-09-14 MED ORDER — ONDANSETRON HCL 4 MG/2ML IJ SOLN: 4.0000 mg | Freq: Four times a day (QID) | INTRAMUSCULAR | Status: DC | PRN

## 2017-09-14 MED ORDER — BUPROPION HCL ER (XL) 150 MG PO TB24
150.0000 mg | ORAL_TABLET | Freq: Every day | ORAL | Status: DC
  Administered 2017-09-14: 150 mg via ORAL
  Filled 2017-09-14: qty 1

## 2017-09-14 MED ORDER — HEPARIN BOLUS VIA INFUSION
2500.0000 [IU] | Freq: Once | INTRAVENOUS | Status: DC
  Filled 2017-09-14: qty 2500

## 2017-09-14 MED ORDER — ASPIRIN 81 MG PO CHEW
324.0000 mg | CHEWABLE_TABLET | ORAL | Status: DC
  Filled 2017-09-14: qty 4

## 2017-09-14 MED ORDER — ACETAMINOPHEN 325 MG PO TABS: 650.0000 mg | ORAL_TABLET | ORAL | Status: DC | PRN

## 2017-09-14 MED ORDER — HEPARIN (PORCINE) IN NACL 100-0.45 UNIT/ML-% IJ SOLN
1250.0000 [IU]/h | INTRAMUSCULAR | Status: DC
  Administered 2017-09-14: 1000 [IU]/h via INTRAVENOUS
  Filled 2017-09-14: qty 250

## 2017-09-14 MED ORDER — EZETIMIBE 10 MG PO TABS
10.0000 mg | ORAL_TABLET | Freq: Every day | ORAL | Status: DC
  Administered 2017-09-14: 10 mg via ORAL
  Filled 2017-09-14: qty 1

## 2017-09-14 MED ORDER — TECHNETIUM TC 99M TETROFOSMIN IV KIT
30.0000 | PACK | Freq: Once | INTRAVENOUS | Status: AC | PRN
  Administered 2017-09-14: 28.73 via INTRAVENOUS

## 2017-09-14 MED ORDER — TECHNETIUM TC 99M TETROFOSMIN IV KIT
10.0000 | PACK | Freq: Once | INTRAVENOUS | Status: AC | PRN
  Administered 2017-09-14: 8.56 via INTRAVENOUS

## 2017-09-14 MED ORDER — VITAMIN D 1000 UNITS PO TABS
5000.0000 [IU] | ORAL_TABLET | Freq: Every day | ORAL | Status: DC
  Administered 2017-09-14: 5000 [IU] via ORAL
  Filled 2017-09-14: qty 5

## 2017-09-14 MED ORDER — ZOLPIDEM TARTRATE 5 MG PO TABS: 5.0000 mg | ORAL_TABLET | Freq: Every evening | ORAL | Status: DC | PRN

## 2017-09-14 MED ORDER — LORAZEPAM 2 MG/ML IJ SOLN
0.5000 mg | Freq: Once | INTRAMUSCULAR | Status: AC
  Administered 2017-09-14: 0.5 mg via INTRAVENOUS
  Filled 2017-09-14: qty 1

## 2017-09-14 MED ORDER — ASPIRIN 300 MG RE SUPP: 300.0000 mg | RECTAL | Status: DC

## 2017-09-14 MED ORDER — ASPIRIN EC 81 MG PO TBEC
81.0000 mg | DELAYED_RELEASE_TABLET | Freq: Every day | ORAL | Status: DC
  Administered 2017-09-14: 81 mg via ORAL
  Filled 2017-09-14: qty 1

## 2017-09-14 MED ORDER — HEPARIN (PORCINE) IN NACL 100-0.45 UNIT/ML-% IJ SOLN: 14.0000 [IU]/kg/h | Freq: Once | INTRAMUSCULAR | Status: DC

## 2017-09-14 MED ORDER — BISOPROLOL FUMARATE 5 MG PO TABS
5.0000 mg | ORAL_TABLET | Freq: Every day | ORAL | Status: DC
  Administered 2017-09-14: 5 mg via ORAL
  Filled 2017-09-14: qty 1

## 2017-09-14 MED ORDER — ASPIRIN EC 81 MG PO TBEC: 81.0000 mg | DELAYED_RELEASE_TABLET | Freq: Every day | ORAL | Status: DC

## 2017-09-14 MED ORDER — INFLUENZA VAC SPLIT QUAD 0.5 ML IM SUSY: 0.5000 mL | PREFILLED_SYRINGE | INTRAMUSCULAR | Status: DC

## 2017-09-14 MED ORDER — MAGNESIUM GLUCONATE 500 MG PO TABS
1000.0000 mg | ORAL_TABLET | Freq: Every day | ORAL | Status: DC
  Filled 2017-09-14: qty 2

## 2017-09-14 MED ORDER — INFLUENZA VAC SPLIT QUAD 0.5 ML IM SUSY
0.5000 mL | PREFILLED_SYRINGE | Freq: Once | INTRAMUSCULAR | Status: AC
  Administered 2017-09-14: 0.5 mL via INTRAMUSCULAR
  Filled 2017-09-14: qty 0.5

## 2017-09-14 NOTE — Progress Notes (Signed)
ANTICOAGULATION CONSULT NOTE - Initial Consult  Pharmacy Consult for heparin Indication: chest pain/ACS  Allergies  Allergen Reactions  . Prednisone     REACTION: Swelling  . Sulfonamide Derivatives     REACTION: Hives    Patient Measurements: Height: 5\' 3"  (160 cm) Weight: 180 lb (81.6 kg) IBW/kg (Calculated) : 52.4 Heparin Dosing Weight: 81.6 kg  Vital Signs: Temp: 98.1 F (36.7 C) (11/09 0218) Temp Source: Oral (11/09 0218) BP: 118/83 (11/09 0246) Pulse Rate: 82 (11/09 0246)  Labs: Recent Labs    09/14/17 0218  HGB 12.3  HCT 37.1  PLT 315  CREATININE 0.74  TROPONINI <0.03    Estimated Creatinine Clearance: 78.5 mL/min (by C-G formula based on SCr of 0.74 mg/dL).   Medical History: Past Medical History:  Diagnosis Date  . Coronary artery disease   . Depression   . Hyperlipemia \\[id number] \  . Kidney stone 08/2016  . Renal disorder   . STEMI (ST elevation myocardial infarction) ([place]) 12/02/2016  . Vitamin B 12 deficiency     Medications:  Scheduled:    Assessment: Patient admitted for CP that has been radiating to jaw. Initial trops negative, EKG shows NSR. Patient is being started on heparin drip  Goal of Therapy:  Heparin level 0.3-0.7 units/ml Monitor platelets by anticoagulation protocol: Yes   Plan:  Will bolus w/ heparin 4000 units IV x 1  Will start drip @ 1000 units/hr. Will check HL @ 0900. Will check CBC w/ am labs.  Tobie Lords, PharmD, BCPS Clinical Pharmacist 09/14/2017

## 2017-09-14 NOTE — ED Notes (Signed)
Admitting MD at bedside.

## 2017-09-14 NOTE — H&P (Signed)
[facility] NAME: Cassandra Cox    MR#:  [id number]  DATE OF BIRTH:  1961/03/04  DATE OF ADMISSION:  09/14/2017  PRIMARY CARE PHYSICIAN: Rich Reining A   REQUESTING/REFERRING PHYSICIAN:   CHIEF COMPLAINT:   Chief Complaint  Patient presents with  . Chest Pain    HISTORY OF PRESENT ILLNESS: Cassandra Cox  is a 56 y.o. female with a known history of coronary artery disease, cardiac stent, hyperlipidemia, nephrolithiasis, vitamin B12 deficiency presented to the emergency room with chest pain. The pain started around 12:30 AM this morning. Pain is located in the left side of the chest. Patient also had pain in the back of the neck and in the left shoulder area. She also broke out with sweat when she had this pain. No complaints of any shortness of breath, orthopnea. Patient compliant with her cardiac medications and last saw cardiology in the office 2 months ago. First set of troponin is negative. EKG showed some ST depression in the inferior leads. Patient received 3 nitroglycerin tablets and felt little better. She was started on IV heparin drip in the emergency room.  PAST MEDICAL HISTORY:   Past Medical History:  Diagnosis Date  . Coronary artery disease   . Depression   . Hyperlipemia \[id number]\  . Kidney stone 08/2016  . Renal disorder   . STEMI (ST elevation myocardial infarction) ([place]) 12/02/2016  . Vitamin B 12 deficiency     PAST SURGICAL HISTORY:  Past Surgical History:  Procedure Laterality Date  . ABDOMINAL HYSTERECTOMY  2003   TAH due to adenomyosis  . BREAST REDUCTION SURGERY  1990  . CORONARY STENT INTERVENTION      SOCIAL HISTORY:  Social History   Tobacco Use  . Smoking status: Never Smoker  . Smokeless tobacco: Never Used  Substance Use Topics  . Alcohol use: No    FAMILY HISTORY:  Family History  Problem Relation Age of Onset  . Heart disease Mother   . Diabetes Mother   . Stroke Mother    . Heart attack Mother   . Atrial fibrillation Mother   . Hypertension Mother   . Hyperlipidemia Mother   . COPD Father   . Hypertension Father   . Hyperlipidemia Father   . Breast cancer Maternal Aunt   . Diabetes Maternal Grandfather     DRUG ALLERGIES:  Allergies  Allergen Reactions  . Prednisone     REACTION: Swelling  . Sulfonamide Derivatives     REACTION: Hives    REVIEW OF SYSTEMS:   CONSTITUTIONAL: No fever, fatigue or weakness.  EYES: No blurred or double vision.  EARS, NOSE, AND THROAT: No tinnitus or ear pain.  RESPIRATORY: No cough, shortness of breath, wheezing or hemoptysis.  CARDIOVASCULAR: Has chest pain,  No orthopnea, edema.  GASTROINTESTINAL: No nausea, vomiting, diarrhea or abdominal pain.  GENITOURINARY: No dysuria, hematuria.  ENDOCRINE: No polyuria, nocturia,  HEMATOLOGY: No anemia, easy bruising or bleeding SKIN: No rash or lesion. MUSCULOSKELETAL: No joint pain or arthritis.   NEUROLOGIC: No tingling, numbness, weakness.  PSYCHIATRY: No anxiety or depression.   MEDICATIONS AT HOME:  Prior to Admission medications   Medication Sig Start Date End Date Taking? Authorizing Provider  5-Hydroxytryptophan (5-HTP) 50 MG CAPS Take 50 mg by mouth 4 (four) times daily.    [provider]  ALPRAZolam Duanne Moron) 1 MG tablet Take 1 mg by mouth at bedtime as needed.  [provider]  aspirin EC 81 MG tablet Take 81 mg by mouth daily.    [provider]  atorvastatin (LIPITOR) 80 MG tablet Take 1 tablet (80 mg total) by mouth daily at 6 PM. 07/17/17   End, Harrell Gave, MD  bisoprolol (ZEBETA) 5 MG tablet Take 1 tablet (5 mg total) by mouth daily. 07/17/17   End, Harrell Gave, MD  buPROPion (WELLBUTRIN XL) 150 MG 24 hr tablet Take 150 mg by mouth daily.      [provider]  Cholecalciferol (VITAMIN D-3) 5000 UNITS TABS Take 1 tablet by mouth daily.      [provider]  Cyanocobalamin (VITAMIN B 12 PO) Take 10 mg by  mouth once a week.    [provider]  ezetimibe (ZETIA) 10 MG tablet Take 1 tablet (10 mg total) by mouth daily. 03/19/17 07/17/17  End, Harrell Gave, MD  magnesium gluconate (MAGONATE) 500 MG tablet Take 1,000 mg by mouth at bedtime.     [provider]  metFORMIN (GLUCOPHAGE) 500 MG tablet Take 500 mg by mouth 2 (two) times daily with a meal.    [provider]  montelukast (SINGULAIR) 10 MG tablet Take 10 mg by mouth at bedtime.    [provider]  nitroGLYCERIN (NITROSTAT) 0.4 MG SL tablet Place 1 tablet (0.4 mg total) under the tongue every 5 (five) minutes as needed for chest pain. 12/03/16   Bettey Costa, MD  rizatriptan (MAXALT-MLT) 5 MG disintegrating tablet Take 5 mg by mouth as needed for migraine. May repeat in 2 hours if needed    [provider]  ticagrelor (BRILINTA) 90 MG TABS tablet Take 1 tablet (90 mg total) by mouth 2 (two) times daily. 07/17/17   End, Harrell Gave, MD  triamcinolone (NASACORT ALLERGY 24HR) 55 MCG/ACT AERO nasal inhaler Place 2 sprays into the nose daily.    [provider]  zolpidem (AMBIEN CR) 12.5 MG CR tablet Take 12.5 mg by mouth at bedtime as needed for sleep.    [provider]      PHYSICAL EXAMINATION:   VITAL SIGNS: Blood pressure 108/61, pulse 82, temperature 98.1 F (36.7 C), temperature source Oral, resp. rate 14, height 5' 3"  (1.6 m), weight 81.6 kg (180 lb), SpO2 100 %.  GENERAL:  56 y.o.-year-old patient lying in the bed with no acute distress.  EYES: Pupils equal, round, reactive to light and accommodation. No scleral icterus. Extraocular muscles intact.  HEENT: Head atraumatic, normocephalic. Oropharynx and nasopharynx clear.  NECK:  Supple, no jugular venous distention. No thyroid enlargement, no tenderness.  LUNGS: Normal breath sounds bilaterally, no wheezing, rales,rhonchi or crepitation. No use of accessory muscles of respiration.  CARDIOVASCULAR: S1, S2 normal. No murmurs,  rubs, or gallops.  ABDOMEN: Soft, nontender, nondistended. Bowel sounds present. No organomegaly or mass.  EXTREMITIES: No pedal edema, cyanosis, or clubbing.  NEUROLOGIC: Cranial nerves II through XII are intact. Muscle strength 5/5 in all extremities. Sensation intact. Gait not checked.  PSYCHIATRIC: The patient is alert and oriented x 3.  SKIN: No obvious rash, lesion, or ulcer.   LABORATORY PANEL:   CBC Recent Labs  Lab 09/14/17 0218  WBC 10.2  HGB 12.3  HCT 37.1  PLT 315  MCV 94.3  MCH 31.3  MCHC 33.2  RDW 13.8   ------------------------------------------------------------------------------------------------------------------  Chemistries  Recent Labs  Lab 09/14/17 0218  NA 137  K 3.5  CL 104  CO2 23  GLUCOSE 91  BUN 18  CREATININE 0.74  CALCIUM  9.3  AST 28  ALT 29  ALKPHOS 78  BILITOT 0.6   ------------------------------------------------------------------------------------------------------------------ estimated creatinine clearance is 78.5 mL/min (by C-G formula based on SCr of 0.74 mg/dL). ------------------------------------------------------------------------------------------------------------------ No results for input(s): TSH, T4TOTAL, T3FREE, THYROIDAB in the last 72 hours.  Invalid input(s): FREET3   Coagulation profile No results for input(s): INR, PROTIME in the last 168 hours. ------------------------------------------------------------------------------------------------------------------- No results for input(s): DDIMER in the last 72 hours. -------------------------------------------------------------------------------------------------------------------  Cardiac Enzymes Recent Labs  Lab 09/14/17 0218  TROPONINI <0.03   ------------------------------------------------------------------------------------------------------------------ Invalid input(s):  POCBNP  ---------------------------------------------------------------------------------------------------------------  Urinalysis    Component Value Date/Time   COLORURINE RED (A) 08/28/2016 1428   APPEARANCEUR CLOUDY (A) 08/28/2016 1428   LABSPEC 1.017 08/28/2016 1428   PHURINE 7.0 08/28/2016 1428   GLUCOSEU NEGATIVE 08/28/2016 1428   HGBUR LARGE (A) 08/28/2016 1428   BILIRUBINUR NEGATIVE 08/28/2016 1428   BILIRUBINUR negative 06/12/2016 1610   KETONESUR 15 (A) 08/28/2016 1428   PROTEINUR NEGATIVE 08/28/2016 1428   UROBILINOGEN negative 06/12/2016 1610   NITRITE NEGATIVE 08/28/2016 1428   LEUKOCYTESUR SMALL (A) 08/28/2016 1428     RADIOLOGY: Dg Chest 2 View  Result Date: 09/14/2017 CLINICAL DATA:  Chest pain EXAM: CHEST  2 VIEW COMPARISON:  Chest radiograph 12/02/2016 FINDINGS: The heart size and mediastinal contours are within normal limits. Both lungs are clear. The visualized skeletal structures are unremarkable. IMPRESSION: No active cardiopulmonary disease. Electronically Signed   By: Ulyses Jarred M.D.   On: 09/14/2017 02:47    EKG: Orders placed or performed during the hospital encounter of 09/14/17  . EKG 12-Lead  . EKG 12-Lead    IMPRESSION AND PLAN: 56 year old female patient with history of coronary artery disease, cardiac stent, hyperlipidemia presented to the emergency room with chest pain.  Admitting diagnosis 1. Unstable angina 2. Coronary artery disease 3. Hyperlipidemia 4. B12 deficiency Treatment plan Admit patient to telemetry observation bed Start patient on aspirin and continue Brilinta IV heparin drip for anticoagulation Cardiology consultation Resume cardiac medications Check echocardiogram and cycle troponin  All the records are reviewed and case discussed with ED provider. Management plans discussed with the patient, family and they are in agreement.  CODE STATUS:FULL CODE Code Status History    Date Active Date Inactive Code Status  Order ID Comments User Context   12/02/2016 19:48 12/04/2016 21:08 Full Code [id number]  Nelva Bush, MD Inpatient       TOTAL TIME TAKING CARE OF THIS PATIENT: 50 minutes.    Saundra Shelling M.D on 09/14/2017 at 3:41 AM  Between 7am to 6pm - Pager - [phone]  After 6pm go to [email or web address] - password [email or web address] [facility]  Office  [phone]  CC: Primary care physician; Deretha Emory

## 2017-09-14 NOTE — Progress Notes (Signed)
Pt stated that before she came to ER this am, and questioned whether she should take more ASA at this time. Notified MD. Per MD not to give 324 mg ASA. Will continue to monitor and assess.

## 2017-09-14 NOTE — Discharge Instructions (Signed)
Heart healthy and ADA diet. °

## 2017-09-14 NOTE — Progress Notes (Signed)
*  PRELIMINARY RESULTS* Echocardiogram 2D Echocardiogram has been performed.  Michiah Masse 09/14/2017, 1:57 PM 

## 2017-09-14 NOTE — Discharge Summary (Signed)
Cassandra Cox NAME: Cassandra Cox    MR#:  [id number]  DATE OF BIRTH:  05-Jan-1961  DATE OF ADMISSION:  09/14/2017   ADMITTING PHYSICIAN: Cassandra Shelling, MD  DATE OF DISCHARGE: 09/14/2017 PRIMARY CARE PHYSICIAN: Cassandra Cox   ADMISSION DIAGNOSIS:  Unstable angina ([name]) [I20.0] Chest pain, unspecified type [R07.9] DISCHARGE DIAGNOSIS:  Active Problems:   Unstable angina ([name])  SECONDARY DIAGNOSIS:   Past Medical History:  Diagnosis Date  . Coronary artery disease    [facility]. 11/2016 Cath/PCI: LM nl, LAD 30ost, 91m(3.0x18 Resolute Integrity DES--3.75), LCX nl, RCA nl.  . Depression   . Diastolic dysfunction    [facility]. 11/2016 Echo: EF 55-60%, Gr2 DD, LAE, nl RV size/fxn.  . Hyperlipemia \\[id number]\ . Kidney stone 08/2016  . STEMI (ST elevation myocardial infarction) ([place] 12/02/2016  . Vitamin B 12 deficiency    HOSPITAL COURSE:   56year old female patient with history of coronary artery disease, cardiac stent, hyperlipidemia presented to the emergency room with chest pain.  1. Unstable angina 2. Coronary artery disease 3. Hyperlipidemia 4. B12 deficiency The patient is placed for observation.  She was treated with heparin drip.  Troponin level are negative.  Per cardiology consult, the patient got stress test which is negative. Continue aspirin and Brilinta The patient has no chest pain no other complaints. DISCHARGE CONDITIONS:  Stable, discharge to home today. CONSULTS OBTAINED:  Treatment Team:  GMinna Merritts MD DRUG ALLERGIES:   Allergies  Allergen Reactions  . Prednisone     REACTION: Swelling  . Sulfonamide Derivatives     REACTION: Hives   DISCHARGE MEDICATIONS:   Allergies as of 09/14/2017      Reactions   Prednisone    REACTION: Swelling   Sulfonamide Derivatives    REACTION: Hives      Medication List    TAKE these medications   5-HTP 50 MG Caps Take 50 mg by mouth 4 (four) times daily.     ALPRAZolam 1 MG tablet Commonly known as:  XANAX Take 1 mg by mouth at bedtime as needed.   aspirin EC 81 MG tablet Take 81 mg by mouth daily.   atorvastatin 80 MG tablet Commonly known as:  LIPITOR Take 1 tablet (80 mg total) by mouth daily at 6 PM.   bisoprolol 5 MG tablet Commonly known as:  ZEBETA Take 1 tablet (5 mg total) by mouth daily.   BUPROPION HCL ER (XL) PO Take 200 mg daily by mouth.   ezetimibe 10 MG tablet Commonly known as:  ZETIA Take 1 tablet (10 mg total) by mouth daily.   levocetirizine 5 MG tablet Commonly known as:  XYZAL Take 1 tablet at bedtime by mouth.   magnesium gluconate 500 MG tablet Commonly known as:  MAGONATE Take 1,000 mg by mouth at bedtime.   metFORMIN 500 MG tablet Commonly known as:  GLUCOPHAGE Take 500 mg by mouth 2 (two) times daily with [facility] meal.   montelukast 10 MG tablet Commonly known as:  SINGULAIR Take 10 mg by mouth at bedtime.   NALTREXONE HCL PO Take 3.5 mg at bedtime by mouth.   NASACORT ALLERGY 24HR 55 MCG/ACT Aero nasal inhaler Generic drug:  triamcinolone Place 2 sprays into the nose daily.   nitroGLYCERIN 0.4 MG SL tablet Commonly known as:  NITROSTAT Place 1 tablet (0.4 mg total) under the tongue every 5 (five) minutes as needed for chest pain.   progesterone  200 MG capsule Commonly known as:  PROMETRIUM Take 200 mg at bedtime by mouth.   rizatriptan 5 MG disintegrating tablet Commonly known as:  MAXALT-MLT Take 5 mg by mouth as needed for migraine. May repeat in 2 hours if needed   ticagrelor 90 MG Tabs tablet Commonly known as:  BRILINTA Take 1 tablet (90 mg total) by mouth 2 (two) times daily.   VITAMIN B 12 PO Take 10 mg by mouth once [facility] week.   Vitamin D-3 5000 units Tabs Take 2 tablets daily by mouth.   zolpidem 12.5 MG CR tablet Commonly known as:  AMBIEN CR Take 12.5 mg by mouth at bedtime as needed for sleep.        DISCHARGE INSTRUCTIONS:  See AVS.  If you experience  worsening of your admission symptoms, develop shortness of breath, life threatening emergency, suicidal or homicidal thoughts you must seek medical attention immediately by calling 911 or calling your MD immediately  if symptoms less severe.  You Must read complete instructions/literature along with all the possible adverse reactions/side effects for all the Medicines you take and that have been prescribed to you. Take any new Medicines after you have completely understood and accpet all the possible adverse reactions/side effects.   Please note  You were cared for by [facility] hospitalist during your hospital stay. If you have any questions about your discharge medications or the care you received while you were in the hospital after you are discharged, you can call the unit and asked to speak with the hospitalist on call if the hospitalist that took care of you is not available. Once you are discharged, your primary care physician will handle any further medical issues. Please note that NO REFILLS for any discharge medications will be authorized once you are discharged, as it is imperative that you return to your primary care physician (or establish [facility] relationship with [facility] primary care physician if you do not have one) for your aftercare needs so that they can reassess your need for medications and monitor your lab values.    On the day of Discharge:  VITAL SIGNS:  Blood pressure 113/73, pulse 79, temperature 98.2 F (36.8 C), temperature source Oral, resp. rate 14, height 5' 3"  (1.6 m), weight 181 lb 6.4 oz (82.3 kg), SpO2 97 %. PHYSICAL EXAMINATION:  GENERAL:  56 y.o.-year-old patient lying in the bed with no acute distress.  Obesity. EYES: Pupils equal, round, reactive to light and accommodation. No scleral icterus. Extraocular muscles intact.  HEENT: Head atraumatic, normocephalic. Oropharynx and nasopharynx clear.  NECK:  Supple, no jugular venous distention. No thyroid enlargement, no tenderness.   LUNGS: Normal breath sounds bilaterally, no wheezing, rales,rhonchi or crepitation. No use of accessory muscles of respiration.  CARDIOVASCULAR: S1, S2 normal. No murmurs, rubs, or gallops.  ABDOMEN: Soft, non-tender, non-distended. Bowel sounds present. No organomegaly or mass.  EXTREMITIES: No pedal edema, cyanosis, or clubbing.  NEUROLOGIC: Cranial nerves II through XII are intact. Muscle strength 5/5 in all extremities. Sensation intact. Gait not checked.  PSYCHIATRIC: The patient is alert and oriented x 3.  SKIN: No obvious rash, lesion, or ulcer.  DATA REVIEW:   CBC Recent Labs  Lab 09/14/17 0218  WBC 10.2  HGB 12.3  HCT 37.1  PLT 315    Chemistries  Recent Labs  Lab 09/14/17 0218  NA 137  K 3.5  CL 104  CO2 23  GLUCOSE 91  BUN 18  CREATININE 0.74  CALCIUM 9.3  AST  28  ALT 29  ALKPHOS 78  BILITOT 0.6     Microbiology Results  Results for orders placed or performed during the hospital encounter of 12/02/16  MRSA PCR Screening     Status: None   Collection Time: 12/02/16  7:40 PM  Result Value Ref Range Status   MRSA by PCR NEGATIVE NEGATIVE Final    Comment:        The GeneXpert MRSA Assay (FDA approved for NASAL specimens only), is one component of [facility] comprehensive MRSA colonization surveillance program. It is not intended to diagnose MRSA infection nor to guide or monitor treatment for MRSA infections.     RADIOLOGY:  Dg Chest 2 View  Result Date: 09/14/2017 CLINICAL DATA:  Chest pain EXAM: CHEST  2 VIEW COMPARISON:  Chest radiograph 12/02/2016 FINDINGS: The heart size and mediastinal contours are within normal limits. Both lungs are clear. The visualized skeletal structures are unremarkable. IMPRESSION: No active cardiopulmonary disease. Electronically Signed   By: Ulyses Jarred M.D.   On: 09/14/2017 02:47   Nm Myocar Multi W/spect W/wall Motion / Ef  Result Date: 09/14/2017 Pharmacological myocardial perfusion imaging study with no significant   ischemia Normal wall motion, EF estimated at 72% No EKG changes concerning for ischemia at peak stress or in recovery. Nonspecific ST abnormality at rest Low risk scan Signed, Esmond Plants, MD, Ph.D [facility]     Management plans discussed with the patient, family and they are in agreement.  CODE STATUS: Full Code   TOTAL TIME TAKING CARE OF THIS PATIENT: 28 minutes.    Demetrios Loll M.D on 09/14/2017 at 1:32 PM  Between 7am to 6pm - Pager - [phone]  After 6pm go to [email or web address] - password [email or web address] [facility]  [facility]  Office  [phone]  CC: Primary care physician; Cassandra Cox   Note: This dictation was prepared with Dragon dictation along with smaller phrase technology. Any transcriptional errors that result from this process are unintentional.

## 2017-09-14 NOTE — ED Triage Notes (Signed)
Pt bib [facility] from home d/t central CP-pressure radiating to left jaw beginning at 1230 this morning. Pt took 324 aspirin and 3 nitro PTA, pt denies CP upon arrival to ED. 12L unremarkable, VSS in route. Hx MI beginning of the year, 1 stent placed

## 2017-09-14 NOTE — Progress Notes (Signed)
Pt arrived via stretcher from ED. Pt A&O, with no complaints of pain but instead pt described it as tension in neck and shoulder.Telemetry monitor applied and called to CCMD. Booklet given, plan of care review with pt and significant other.

## 2017-09-14 NOTE — Plan of Care (Signed)
  Activity: Ability to tolerate increased activity will improve 09/14/2017 0556 - Progressing by Jeri Cos, RN   Cardiac: Ability to achieve and maintain adequate cardiovascular perfusion will improve 09/14/2017 0556 - Progressing by Jeri Cos, RN

## 2017-09-14 NOTE — Progress Notes (Signed)
IV and tele removed. Discharge instructions given to patient. No distress at this time. Husband is at bedside and will transport patient home.

## 2017-09-14 NOTE — ED Notes (Signed)
MD Sung at bedside. 

## 2017-09-14 NOTE — ED Provider Notes (Signed)
[facility] Emergency Department Provider Note   ____________________________________________   First MD Initiated Contact with Patient 09/14/17 [phone]     (approximate)  I have reviewed the triage vital signs and the nursing notes.   HISTORY  Chief Complaint Chest Pain    HPI Cassandra Cox is a 56 y.o. female brought to the ED from home via EMS with a chief complaint of chest pain.  Patient has a history of CAD status post STEMI with stent, on Brilinta, who was awakened with central chest pressure radiating to her right neck and jaw approximately 12:30 AM.  Patient took 4 baby aspirin and 3 nitroglycerin prior to arrival.  She is chest pain-free upon arrival to the ED.  Symptoms associated with diaphoresis and lightheadedness.Marland Kitchen  Spouse states patient has been under tremendous stress recently, taking care of her elderly mother and working a full-time job.  Patient denies recent fever, chills, cough, congestion, shortness of breath, abdominal pain, nausea, vomiting, diarrhea.  Denies recent travel or trauma.   Past Medical History:  Diagnosis Date  . Coronary artery disease   . Depression   . Hyperlipemia \\[id number] \  . Kidney stone 08/2016  . Renal disorder   . STEMI (ST elevation myocardial infarction) ([place]) 12/02/2016  . Vitamin B 12 deficiency     Patient Active Problem List   Diagnosis Date Noted  . Coronary artery disease of native artery of native heart with stable angina pectoris ([place]) 07/18/2017  . Insomnia 08/25/2015  . Hot flashes, menopausal 08/25/2015  . Well woman exam 07/22/2015  . Family history of diabetes mellitus 07/15/2014  . Vitamin D deficiency 11/03/2009  . Hyperlipidemia LDL goal <70 11/03/2009  . ANXIETY 11/03/2009  . Depression 11/03/2009    Past Surgical History:  Procedure Laterality Date  . ABDOMINAL HYSTERECTOMY  2003   TAH due to adenomyosis  . BREAST REDUCTION SURGERY  1990  . CORONARY STENT INTERVENTION       Prior to Admission medications   Medication Sig Start Date End Date Taking? Authorizing Provider  5-Hydroxytryptophan (5-HTP) 50 MG CAPS Take 50 mg by mouth 4 (four) times daily.    [provider]  ALPRAZolam Duanne Moron) 1 MG tablet Take 1 mg by mouth at bedtime as needed.      [provider]  aspirin EC 81 MG tablet Take 81 mg by mouth daily.    [provider]  atorvastatin (LIPITOR) 80 MG tablet Take 1 tablet (80 mg total) by mouth daily at 6 PM. 07/17/17   End, Harrell Gave, MD  bisoprolol (ZEBETA) 5 MG tablet Take 1 tablet (5 mg total) by mouth daily. 07/17/17   End, Harrell Gave, MD  buPROPion (WELLBUTRIN XL) 150 MG 24 hr tablet Take 150 mg by mouth daily.      [provider]  Cholecalciferol (VITAMIN D-3) 5000 UNITS TABS Take 1 tablet by mouth daily.      [provider]  Cyanocobalamin (VITAMIN B 12 PO) Take 10 mg by mouth once a week.    [provider]  ezetimibe (ZETIA) 10 MG tablet Take 1 tablet (10 mg total) by mouth daily. 03/19/17 07/17/17  End, Harrell Gave, MD  magnesium gluconate (MAGONATE) 500 MG tablet Take 1,000 mg by mouth at bedtime.     [provider]  metFORMIN (GLUCOPHAGE) 500 MG tablet Take 500 mg by mouth 2 (two) times daily with a meal.    [provider]  montelukast (SINGULAIR) 10 MG tablet Take 10 mg  by mouth at bedtime.    [provider]  nitroGLYCERIN (NITROSTAT) 0.4 MG SL tablet Place 1 tablet (0.4 mg total) under the tongue every 5 (five) minutes as needed for chest pain. 12/03/16   Bettey Costa, MD  rizatriptan (MAXALT-MLT) 5 MG disintegrating tablet Take 5 mg by mouth as needed for migraine. May repeat in 2 hours if needed    [provider]  ticagrelor (BRILINTA) 90 MG TABS tablet Take 1 tablet (90 mg total) by mouth 2 (two) times daily. 07/17/17   End, Harrell Gave, MD  triamcinolone (NASACORT ALLERGY 24HR) 55 MCG/ACT AERO nasal inhaler Place 2 sprays into the nose daily.     [provider]  zolpidem (AMBIEN CR) 12.5 MG CR tablet Take 12.5 mg by mouth at bedtime as needed for sleep.    [provider]    Allergies Prednisone and Sulfonamide derivatives  Family History  Problem Relation Age of Onset  . Heart disease Mother   . Diabetes Mother   . Stroke Mother   . Heart attack Mother   . Atrial fibrillation Mother   . Hypertension Mother   . Hyperlipidemia Mother   . COPD Father   . Hypertension Father   . Hyperlipidemia Father   . Breast cancer Maternal Aunt   . Diabetes Maternal Grandfather     Social History Social History   Tobacco Use  . Smoking status: Never Smoker  . Smokeless tobacco: Never Used  Substance Use Topics  . Alcohol use: No  . Drug use: No    Review of Systems  Constitutional: No fever/chills. Eyes: No visual changes. ENT: No sore throat. Cardiovascular: Positive for chest pain. Respiratory: Denies shortness of breath. Gastrointestinal: No abdominal pain.  No nausea, no vomiting.  No diarrhea.  No constipation. Genitourinary: Negative for dysuria. Musculoskeletal: Negative for back pain. Skin: Negative for rash. Neurological: Negative for headaches, focal weakness or numbness.   ____________________________________________   PHYSICAL EXAM:  VITAL SIGNS: ED Triage Vitals  Enc Vitals Group     BP 09/14/17 0218 130/65     Pulse Rate 09/14/17 0218 92     Resp 09/14/17 0218 (!) 23     Temp 09/14/17 0218 98.1 F (36.7 C)     Temp Source 09/14/17 0218 Oral     SpO2 09/14/17 0218 100 %     Weight 09/14/17 0219 180 lb (81.6 kg)     Height 09/14/17 0219 5\' 3"  (1.6 m)     Head Circumference --      Peak Flow --      Pain Score --      Pain Loc --      Pain Edu? --      Excl. in [place]? --     Constitutional: Alert and oriented. Well appearing and in mild acute distress.  Eyes: Conjunctivae are normal. PERRL. EOMI. Head: Atraumatic. Nose: No congestion/rhinnorhea. Mouth/Throat: Mucous  membranes are moist.  Oropharynx non-erythematous. Neck: No stridor.  No carotid bruits. Cardiovascular: Normal rate, regular rhythm. Grossly normal heart sounds.  Good peripheral circulation. Respiratory: Normal respiratory effort.  No retractions. Lungs CTAB. Gastrointestinal: Soft and nontender. No distention. No abdominal bruits. No CVA tenderness. Musculoskeletal: No lower extremity tenderness nor edema.  No joint effusions. Neurologic:  Normal speech and language. No gross focal neurologic deficits are appreciated.  Skin:  Skin is warm, dry and intact. No rash noted. Psychiatric: Mood and affect are mildly anxious. Speech and behavior are normal.  ____________________________________________   LABS (  all labs ordered are listed, but only abnormal results are displayed)  Labs Reviewed  COMPREHENSIVE METABOLIC PANEL  CBC  TROPONIN I   ____________________________________________  EKG  ED ECG REPORT I, Avarie Tavano J, the attending physician, personally viewed and interpreted this ECG.   Date: 09/14/2017  EKG Time: 0217  Rate: 95  Rhythm: normal EKG, normal sinus rhythm  Axis: Normal  Intervals:none  ST&T Change: ST depression inferior lateral leads No significant change from 06/12/2017; ST depression slightly more pronounced  ____________________________________________  RADIOLOGY  Dg Chest 2 View  Result Date: 09/14/2017 CLINICAL DATA:  Chest pain EXAM: CHEST  2 VIEW COMPARISON:  Chest radiograph 12/02/2016 FINDINGS: The heart size and mediastinal contours are within normal limits. Both lungs are clear. The visualized skeletal structures are unremarkable. IMPRESSION: No active cardiopulmonary disease. Electronically Signed   By: Ulyses Jarred M.D.   On: 09/14/2017 02:47    ____________________________________________   PROCEDURES  Procedure(s) performed: None  Procedures  Critical Care performed: Yes, see critical care note(s)  CRITICAL CARE Performed by:  Paulette Blanch   Total critical care time: 30 minutes  Critical care time was exclusive of separately billable procedures and treating other patients.  Critical care was necessary to treat or prevent imminent or life-threatening deterioration.  Critical care was time spent personally by me on the following activities: development of treatment plan with patient and/or surrogate as well as nursing, discussions with consultants, evaluation of patient's response to treatment, examination of patient, obtaining history from patient or surrogate, ordering and performing treatments and interventions, ordering and review of laboratory studies, ordering and review of radiographic studies, pulse oximetry and re-evaluation of patient's condition. ____________________________________________   INITIAL IMPRESSION / ASSESSMENT AND PLAN / ED COURSE  As part of my medical decision making, I reviewed the following data within the electronic MEDICAL RECORD NUMBER History obtained from family, Nursing notes reviewed and incorporated, Labs reviewed, EKG interpreted, Old EKG reviewed, Old chart reviewed, Discussed with admitting physician Dr. Estanislado Pandy and Notes from prior ED visits.   56 year old female with a history of CAD status post MI with stent, on aspirin and Brilinta, who presents with symptoms concerning for unstable angina. Differential diagnosis includes, but is not limited to, ACS, aortic dissection, pulmonary embolism, cardiac tamponade, pneumothorax, pneumonia, pericarditis, myocarditis, GI-related causes including esophagitis/gastritis, and musculoskeletal chest wall pain.    Initial troponin is unremarkable.  EKG is generally unchanged from May of this year with deeper ST depression.  Low-dose Ativan given for anxiolytic.  Patient is chest pain-free after 4 baby aspirin and 3 sublingual nitroglycerin.  We will apply nitroglycerin paste, initiate heparin bolus and drip.  Will discuss with hospitalist to  evaluate patient in the emergency department for admission.      ____________________________________________   FINAL CLINICAL IMPRESSION(S) / ED DIAGNOSES  Final diagnoses:  Chest pain, unspecified type  Unstable angina [facility])     ED Discharge Orders    None       Note:  This document was prepared using Dragon voice recognition software and may include unintentional dictation errors.    Paulette Blanch, MD 09/14/17 [phone]

## 2017-09-14 NOTE — Telephone Encounter (Signed)
Admitted

## 2017-09-14 NOTE — ED Notes (Signed)
MD Pyreddy states stop heparin and recheck PTT in 1 hour.   Critical PTT to pyreddy by this RN

## 2017-09-14 NOTE — Consult Note (Signed)
Cardiology Consult    Patient ID: Cassandra Cox MRN: [id number], DOB/AGE: 1961/05/27   Admit date: 09/14/2017 Date of Consult: 09/14/2017  Primary Physician: Deretha Emory Primary Cardiologist: Andree Coss, MD  Requesting Provider: Q. Chen  Patient Profile    Cassandra Cox is a 56 y.o. female with a history of CAD s/p ant STEMI and DES  LAD in 01/9766, HL, diastolic dysfxn, and nephrolithiasis, who is being seen today for the evaluation of chest pain  at the request of Dr. Bridgett Larsson.  Past Medical History   Past Medical History:  Diagnosis Date  . Coronary artery disease    a. 11/2016 Cath/PCI: LM nl, LAD 30ost, 69m (3.0x18 Resolute Integrity DES--3.75), LCX nl, RCA nl.  . Depression   . Diastolic dysfunction    a. 11/2016 Echo: EF 55-60%, Gr2 DD, LAE, nl RV size/fxn.  . Hyperlipemia \\[id number] \  . Kidney stone 08/2016  . STEMI (ST elevation myocardial infarction) ([place]) 12/02/2016  . Vitamin B 12 deficiency     Past Surgical History:  Procedure Laterality Date  . ABDOMINAL HYSTERECTOMY  2003   TAH due to adenomyosis  . BREAST REDUCTION SURGERY  1990  . CORONARY STENT INTERVENTION       Allergies  Allergies  Allergen Reactions  . Prednisone     REACTION: Swelling  . Sulfonamide Derivatives     REACTION: Hives    History of Present Illness    56 y/o ? with the above complex PMH including CAD, HL, diastolic dysfxn, depression, and nephrolithiasis.  She suffered an anterior STEMI in 11/2016 and was found to have a subtotal occlusion of the mid LAD and otw nonobs dzs.  The LAD was successfully treated with a DES.  Echo showed nl EF w/ Gr2 DD.  She had done reasonably well post-MI and was last seen in clinic in Sept @ which time she reported intermittent chest discomfort when "rushing or anxious."  She was placed on low dose bisoprolol (fatigue with metoprolol) due to concern for microvascular angina or angina potentially stemming from a jailed diagonal branch.  Following that  visit, she never started bisoprolol as she had no recurrence of c/p and HR/BP stable.   She was in her usoh until earlier this week when she began to note discomfort between her shoulder blades.  This was constant in nature and varied in severity, but never really went away.  She thought that this was secondary to positioning as she's been sitting forward in her chair @ work.  When symptoms became more severe, they did seem to improve with resetting her position and deep breathing.  Last night, she was sitting on her couch @ around 12:30 am and had recurrent pain between her shoulder blades.  Within 15-20 mins, this moved to her shoulders and was followed by substernal chest heaviness/tightness, which then radiated to her jaw.  She may have had mild dyspnea.  Symptoms were reminiscent of prior angina and so she took 2 ntg with some, though not complete relief around 1am.  Ss persisted for another 1.5 hrs before she called EMS and took another NTG and 4 baby ASA.  She was taken to the [facility] ED around 2:30 AM and was treated with NTP and ativan.  Chest pain eventually resolved completely after about another 30 mins to an hour - total duration ~ 3 - 3.5 hrs.  ECG non-acute.  Initial trop nl.  No recurrent chest pain.  Still with some discomfort between her  shoulder blades and lower posterior neck.  Inpatient Medications    . aspirin  324 mg Oral NOW   Or  . aspirin  300 mg Rectal NOW  . aspirin EC  81 mg Oral Daily  . atorvastatin  80 mg Oral q1800  . bisoprolol  5 mg Oral Daily  . buPROPion  150 mg Oral Daily  . cholecalciferol  5,000 Units Oral Daily  . ezetimibe  10 mg Oral Daily  . [START ON 09/15/2017] Influenza vac split quadrivalent PF  0.5 mL Intramuscular Tomorrow-1000  . magnesium gluconate  1,000 mg Oral QHS  . montelukast  10 mg Oral QHS  . ticagrelor  90 mg Oral BID  . triamcinolone  2 spray Nasal Daily    Family History    Family History  Problem Relation Age of Onset  . Heart  disease Mother   . Diabetes Mother   . Stroke Mother   . Heart attack Mother   . Atrial fibrillation Mother   . Hypertension Mother   . Hyperlipidemia Mother   . COPD Father   . Hypertension Father   . Hyperlipidemia Father   . Breast cancer Maternal Aunt   . Diabetes Maternal Grandfather     Social History    Social History   Socioeconomic History  . Marital status: Divorced    Spouse name: Not on file  . Number of children: Not on file  . Years of education: Not on file  . Highest education level: Not on file  Social Needs  . Financial resource strain: Not on file  . Food insecurity - worry: Not on file  . Food insecurity - inability: Not on file  . Transportation needs - medical: Not on file  . Transportation needs - non-medical: Not on file  Occupational History    Employer: [employer]  Tobacco Use  . Smoking status: Never Smoker  . Smokeless tobacco: Never Used  Substance and Sexual Activity  . Alcohol use: No  . Drug use: No  . Sexual activity: Yes  Other Topics Concern  . Not on file  Social History Narrative  . Not on file     Review of Systems    General:  No chills, fever, night sweats or weight changes.  Cardiovascular:  +++ chest pain, no dyspnea on exertion, edema, orthopnea, palpitations, paroxysmal nocturnal dyspnea. Dermatological: No rash, lesions/masses Respiratory: No cough, dyspnea Urologic: No hematuria, dysuria Abdominal:   No nausea, vomiting, diarrhea, bright red blood per rectum, melena, or hematemesis Neurologic:  No visual changes, wkns, changes in mental status. MSK: mid scapular back pain - ? Better w/ repositioning. All other systems reviewed and are otherwise negative except as noted above.  Physical Exam    Blood pressure 113/73, pulse 79, temperature 98.2 F (36.8 C), temperature source Oral, resp. rate 14, height 5\' 3"  (1.6 m), weight 181 lb 6.4 oz (82.3 kg), SpO2 97 %.  General: Pleasant, NAD Psych: Normal  affect. Neuro: Alert and oriented X 3. Moves all extremities spontaneously. HEENT: Normal  Neck: Supple without bruits or JVD. Lungs:  Resp regular and unlabored, CTA. Heart: RRR no s3, s4, or murmurs. Abdomen: Soft, non-tender, non-distended, BS + x 4.  Extremities: No clubbing, cyanosis or edema. DP/PT/Radials 2+ and equal bilaterally.  Labs     Recent Labs    09/14/17 0218  TROPONINI <0.03   Lab Results  Component Value Date   WBC 10.2 09/14/2017   HGB 12.3 09/14/2017  HCT 37.1 09/14/2017   MCV 94.3 09/14/2017   PLT 315 09/14/2017    Recent Labs  Lab 09/14/17 0218  NA 137  K 3.5  CL 104  CO2 23  BUN 18  CREATININE 0.74  CALCIUM 9.3  PROT 7.5  BILITOT 0.6  ALKPHOS 78  ALT 29  AST 28  GLUCOSE 91   Lab Results  Component Value Date   CHOL 138 09/14/2017   HDL 60 09/14/2017   LDLCALC 69 09/14/2017   TRIG 43 09/14/2017    Radiology Studies    Dg Chest 2 View  Result Date: 09/14/2017 CLINICAL DATA:  Chest pain EXAM: CHEST  2 VIEW COMPARISON:  Chest radiograph 12/02/2016 FINDINGS: The heart size and mediastinal contours are within normal limits. Both lungs are clear. The visualized skeletal structures are unremarkable. IMPRESSION: No active cardiopulmonary disease. Electronically Signed   By: Ulyses Jarred M.D.   On: 09/14/2017 02:47   ECG & Cardiac Imaging    RSR, 95, mild inflat ST depression - similar to prior.  Assessment & Plan    1.  [place]/CAD: Pt w/ prior h/o CAD s/p Ant STEMI and LAD stenting in 11/2016.  She recovered well though was having intermittent chest discomfort as an outpt - ? 2/2 jailed diag vs microvascular angina as LCX and RCA were normal.  She has been having midscapular back pain over the past week - seemed to be positional, but had recurrent pain last night that became associated with chest pain radiating to her jaws - similar to prior angina.  Ss persisted the better part of 3.5 hrs prior to relief in the ED (NTP and ativan).  ECG  non-acute.  Initial trop neg, f/u pending.  Currently pain free.  Cont  blocker, asa, brilinta, and statin Rx. If f/u trop remains wnl  lexiscan mv this am.  If trop +  will need cath.  2.  HL:  LDL 67.  Cont high potency statin therapy.  Signed, Murray Hodgkins, NP 09/14/2017, 7:58 AM  For questions or updates, please contact   Please consult [email or web address] for contact info under Cardiology/STEMI.

## 2017-09-14 NOTE — ED Notes (Signed)
Pt. Returned to tx. room in stable condition with no acute changes since departure from unit for scans.   Family at bedside.

## 2017-09-14 NOTE — Telephone Encounter (Signed)
-----   Message from Blain Pais sent at 09/14/2017  2:13 PM EST ----- Regarding: tcm/ph 12/7 2 pm Murray Hodgkins, NP

## 2017-09-15 LAB — HIV ANTIBODY (ROUTINE TESTING W REFLEX): HIV Screen 4th Generation wRfx: NONREACTIVE

## 2017-09-17 NOTE — Telephone Encounter (Signed)
Left detailed voicemail message of appointment information and to call back if any questions regarding discharge instructions or medications.

## 2017-09-25 ENCOUNTER — Ambulatory Visit (INDEPENDENT_AMBULATORY_CARE_PROVIDER_SITE_OTHER): Payer: BLUE CROSS/BLUE SHIELD | Admitting: Obstetrics and Gynecology

## 2017-09-25 ENCOUNTER — Encounter: Payer: Self-pay | Admitting: Obstetrics and Gynecology

## 2017-09-25 VITALS — BP 152/90 | HR 96 | Ht 63.0 in | Wt 184.0 lb

## 2017-09-25 DIAGNOSIS — Z124 Encounter for screening for malignant neoplasm of cervix: Secondary | ICD-10-CM

## 2017-09-25 DIAGNOSIS — Z1231 Encounter for screening mammogram for malignant neoplasm of breast: Secondary | ICD-10-CM | POA: Diagnosis not present

## 2017-09-25 DIAGNOSIS — Z01419 Encounter for gynecological examination (general) (routine) without abnormal findings: Secondary | ICD-10-CM | POA: Diagnosis not present

## 2017-09-25 DIAGNOSIS — Z1151 Encounter for screening for human papillomavirus (HPV): Secondary | ICD-10-CM

## 2017-09-25 DIAGNOSIS — Z1239 Encounter for other screening for malignant neoplasm of breast: Secondary | ICD-10-CM

## 2017-09-25 DIAGNOSIS — R311 Benign essential microscopic hematuria: Secondary | ICD-10-CM

## 2017-09-25 DIAGNOSIS — R1031 Right lower quadrant pain: Secondary | ICD-10-CM

## 2017-09-25 LAB — POCT URINALYSIS DIPSTICK
Bilirubin, UA: NEGATIVE
Glucose, UA: NEGATIVE
Ketones, UA: NEGATIVE
Leukocytes, UA: NEGATIVE
Nitrite, UA: NEGATIVE
Protein, UA: NEGATIVE
Spec Grav, UA: 1.015 (ref 1.010–1.025)
pH, UA: 7 (ref 5.0–8.0)

## 2017-09-25 NOTE — Progress Notes (Signed)
PCP: Deretha Emory   Chief Complaint  Patient presents with  . Gynecologic Exam    HPI:      [name] is a 56 y.o. No obstetric history on file. who LMP was No LMP recorded. Patient has had a hysterectomy., presents today for her annual examination.  Her menses are absent due to hyst. She does not have intermenstrual bleeding.  She does have tolerable vasomotor sx.  Sex activity: single partner, contraception - post menopausal status. She does not have vaginal dryness.  Last Pap: November 17, 2013  Results were: no abnormalities /neg HPV DNA.  Hx of STDs: none  Last mammogram: December 06, 2016  Results were: normal--routine follow-up in 12 months There is a FH of breast cancer in her mat aunt, genetic testing not indicated. There is no FH of ovarian cancer. The patient does do self-breast exams.  Colonoscopy: colonoscopy 8 years ago without abnormalities. . Repeat due after 10 years.   Tobacco use: The patient denies current or previous tobacco use. Alcohol use: none Exercise: not active  She does get adequate calcium and Vitamin D in her diet.  Labs with PCP.   Pt still having issues with pelvic pain, RLQ mostly now. Pt had neg eval with me 08/27/17 with neg GYN u/s. Pt s/p hyst. Thought to be musculoskeletal given lifting. Could also be scar tissue. Pain is still intermittent, may last a day to several days. Sx are random. Hx of UTIs in the past and kidney stones.   NOTE FROM 08/27/17: "Pain is achy and intermittent for the past wk, usually Lt side, sometimes Rt. She denies any aggrav/allev factors. Pain doesn't affect her sleep. She hasn't taken any meds for sx because she can only take tylenol with current meds and hasn't done that. No GI sx, vag sx, urin sx. No fevers. Has noted low back pain. She also felt a hard area just above her TAH scar RT side when showering. Area is uncomfortable with palpation and causes aching."    Past Medical History:  Diagnosis  Date  . Coronary artery disease    a. 11/2016 Cath/PCI: LM nl, LAD 30ost, 74m (3.0x18 Resolute Integrity DES--3.75), LCX nl, RCA nl.  . Depression   . Diastolic dysfunction    a. 11/2016 Echo: EF 55-60%, Gr2 DD, LAE, nl RV size/fxn.  . Hyperlipemia \\[id number] \  . Kidney stone 08/2016  . MVP (mitral valve prolapse)   . STEMI (ST elevation myocardial infarction) ([place]) 12/02/2016  . Vitamin B 12 deficiency     Past Surgical History:  Procedure Laterality Date  . ABDOMINAL HYSTERECTOMY  2003   TAH due to adenomyosis  . BREAST REDUCTION SURGERY  1990  . CARDIAC CATHETERIZATION N/A 12/02/2016   Procedure: Coronary Stent Intervention;  Surgeon: Nelva Bush, MD;  Location: [place] [facility];  Service: Cardiovascular;  Laterality: N/A;  . CARDIAC CATHETERIZATION N/A 12/02/2016   Procedure: Left Heart Cath and Coronary Angiography;  Surgeon: Nelva Bush, MD;  Location: [place] [facility];  Service: Cardiovascular;  Laterality: N/A;  . CARDIAC CATHETERIZATION N/A 12/02/2016   Procedure: Intravascular Ultrasound/IVUS;  Surgeon: Nelva Bush, MD;  Location: [place] [facility];  Service: Cardiovascular;  Laterality: N/A;  . CORONARY STENT INTERVENTION      Family History  Problem Relation Age of Onset  . Heart disease Mother   . Diabetes Mother   . Stroke Mother   . Heart attack Mother   . Atrial fibrillation Mother   .  Hypertension Mother   . Hyperlipidemia Mother   . COPD Father   . Hypertension Father   . Hyperlipidemia Father   . Breast cancer Maternal Aunt 17       x2 age 4  . Diabetes Maternal Grandfather     Social History   Socioeconomic History  . Marital status: Divorced    Spouse name: Not on file  . Number of children: Not on file  . Years of education: Not on file  . Highest education level: Not on file  Social Needs  . Financial resource strain: Not on file  . Food insecurity - worry: Not on file  . Food insecurity - inability: Not on file  .  Transportation needs - medical: Not on file  . Transportation needs - non-medical: Not on file  Occupational History    Employer: [employer]  Tobacco Use  . Smoking status: Never Smoker  . Smokeless tobacco: Never Used  Substance and Sexual Activity  . Alcohol use: No  . Drug use: No  . Sexual activity: Yes    Birth control/protection: Surgical  Other Topics Concern  . Not on file  Social History Narrative  . Not on file    Current Meds  Medication Sig  . 5-Hydroxytryptophan (5-HTP) 50 MG CAPS Take 50 mg by mouth 4 (four) times daily.  Marland Kitchen ALPRAZolam (XANAX) 1 MG tablet Take 1 mg by mouth at bedtime as needed.    Marland Kitchen aspirin EC 81 MG tablet Take 81 mg by mouth daily.  Marland Kitchen atorvastatin (LIPITOR) 80 MG tablet Take 1 tablet (80 mg total) by mouth daily at 6 PM.  . bisoprolol (ZEBETA) 5 MG tablet Take 1 tablet (5 mg total) by mouth daily.  . BUPROPION HCL ER, XL, PO Take 200 mg daily by mouth.   . Cholecalciferol (VITAMIN D-3) 5000 UNITS TABS Take 2 tablets daily by mouth.   . Cyanocobalamin (VITAMIN B 12 PO) Take 10 mg by mouth once a week.  . levocetirizine (XYZAL) 5 MG tablet Take 1 tablet at bedtime by mouth.  . magnesium gluconate (MAGONATE) 500 MG tablet Take 1,000 mg by mouth at bedtime.   . metFORMIN (GLUCOPHAGE) 500 MG tablet Take 500 mg by mouth 2 (two) times daily with a meal.  . montelukast (SINGULAIR) 10 MG tablet Take 10 mg by mouth at bedtime.  Marland Kitchen NALTREXONE HCL PO Take 3.5 mg at bedtime by mouth.  . nitroGLYCERIN (NITROSTAT) 0.4 MG SL tablet Place 1 tablet (0.4 mg total) under the tongue every 5 (five) minutes as needed for chest pain.  . progesterone (PROMETRIUM) 200 MG capsule Take 200 mg at bedtime by mouth.  . rizatriptan (MAXALT-MLT) 5 MG disintegrating tablet Take 5 mg by mouth as needed for migraine. May repeat in 2 hours if needed  . ticagrelor (BRILINTA) 90 MG TABS tablet Take 1 tablet (90 mg total) by mouth 2 (two) times daily.  Marland Kitchen triamcinolone  (NASACORT ALLERGY 24HR) 55 MCG/ACT AERO nasal inhaler Place 2 sprays into the nose daily.  Marland Kitchen zolpidem (AMBIEN CR) 12.5 MG CR tablet Take 12.5 mg by mouth at bedtime as needed for sleep.      ROS:  Review of Systems  Constitutional: Negative for fatigue, fever and unexpected weight change.  Respiratory: Negative for cough, shortness of breath and wheezing.   Cardiovascular: Negative for chest pain, palpitations and leg swelling.  Gastrointestinal: Negative for blood in stool, constipation, diarrhea, nausea and vomiting.  Endocrine: Negative for cold intolerance,  heat intolerance and polyuria.  Genitourinary: Negative for dyspareunia, dysuria, flank pain, frequency, genital sores, hematuria, menstrual problem, pelvic pain, urgency, vaginal bleeding, vaginal discharge and vaginal pain.  Musculoskeletal: Negative for back pain, joint swelling and myalgias.  Skin: Negative for rash.  Neurological: Negative for dizziness, syncope, light-headedness, numbness and headaches.  Hematological: Negative for adenopathy.  Psychiatric/Behavioral: Negative for agitation, confusion, sleep disturbance and suicidal ideas. The patient is not nervous/anxious.      Objective: BP (!) 152/90   Pulse 96   Ht 5\' 3"  (1.6 m)   Wt 184 lb (83.5 kg)   BMI 32.59 kg/m    Physical Exam  Constitutional: She is oriented to person, place, and time. She appears well-developed and well-nourished.  Genitourinary: Vagina normal. There is no rash or tenderness on the right labia. There is no rash or tenderness on the left labia. No erythema or tenderness in the vagina. No vaginal discharge found. Right adnexum does not display mass and does not display tenderness. Left adnexum does not display mass and does not display tenderness.  Genitourinary Comments: UTERUS/CX SURG REM BUT BLADDER AREA IS TENDER TO PALPATE  Neck: Normal range of motion. No thyromegaly present.  Cardiovascular: Normal rate, regular rhythm and normal  heart sounds.  No murmur heard. Pulmonary/Chest: Effort normal and breath sounds normal. Right breast exhibits no mass, no nipple discharge, no skin change and no tenderness. Left breast exhibits no mass, no nipple discharge, no skin change and no tenderness.  Abdominal: Soft. There is no tenderness. There is no guarding.  Musculoskeletal: Normal range of motion.  Neurological: She is alert and oriented to person, place, and time. No cranial nerve deficit.  Psychiatric: She has a normal mood and affect. Her behavior is normal.  Vitals reviewed.   Results: Results for orders placed or performed in visit on 09/25/17 (from the past 24 hour(s))  POCT Urinalysis Dipstick     Status: Abnormal   Collection Time: 09/25/17  2:43 PM  Result Value Ref Range   Color, UA YELLOW    Clarity, UA CLEAR    Glucose, UA NEG    Bilirubin, UA NEG    Ketones, UA NEG    Spec Grav, UA 1.015 1.010 - 1.025   Blood, UA 5-10    pH, UA 7.0 5.0 - 8.0   Protein, UA NEG    Urobilinogen, UA  0.2 or 1.0 E.U./dL   Nitrite, UA NEG    Leukocytes, UA Negative Negative    Assessment/Plan:  Encounter for annual routine gynecological examination  Cervical cancer screening - Plan: IGP, Aptima HPV  Screening for HPV (human papillomavirus) - Plan: IGP, Aptima HPV  Screening for breast cancer - Pt due for mammo 1/19 at [facility]. - Plan: MM DIGITAL SCREENING BILATERAL  RLQ abdominal pain - Neg eval/u/s 10/18. Question etiology--urinary, musculoskeletal vs adhesions. Check C&S given microscopic hematuria on dipstick.  - Plan: Urine Culture, POCT Urinalysis Dipstick  Benign essential microscopic hematuria - On dip. Check C&S. Will f/u with results. If pos, will rechk and then send back to urology prn. Hx of kidney stones.  - Plan: Urine Culture, POCT Urinalysis Dipstick        GYN counsel breast self exam, mammography screening, adequate intake of calcium and vitamin D, diet and exercise    F/U  Return in about 1 year  (around 09/25/2018).  Maxamus Colao B. Suhey Radford, PA-C 09/25/2017 2:45 PM

## 2017-09-25 NOTE — Patient Instructions (Signed)
I value your feedback and entrusting us with your care. If you get a [facility] patient survey, I would appreciate you taking the time to let us know about your experience today. Thank you! 

## 2017-09-27 LAB — IGP, APTIMA HPV
HPV Aptima: NEGATIVE
PAP Smear Comment: 0

## 2017-09-27 LAB — URINE CULTURE

## 2017-10-03 ENCOUNTER — Telehealth: Payer: Self-pay | Admitting: Obstetrics and Gynecology

## 2017-10-03 DIAGNOSIS — R311 Benign essential microscopic hematuria: Secondary | ICD-10-CM

## 2017-10-03 NOTE — Telephone Encounter (Signed)
Pt aware of neg C&S. Had hematuria on dipstick last wk. RTO next wk for urine rechk with RN. If still abn, will refer back to urology. Hx of kidney stones in the past.

## 2017-10-12 ENCOUNTER — Ambulatory Visit: Payer: Self-pay | Admitting: Nurse Practitioner

## 2017-10-16 ENCOUNTER — Ambulatory Visit: Payer: Self-pay | Admitting: Nurse Practitioner

## 2017-10-17 ENCOUNTER — Telehealth: Payer: Self-pay | Admitting: Nurse Practitioner

## 2017-10-17 NOTE — Telephone Encounter (Signed)
lmov to r/s 10/16/17 missed appt due to weather with C Sharolyn Douglas

## 2017-10-18 ENCOUNTER — Ambulatory Visit (INDEPENDENT_AMBULATORY_CARE_PROVIDER_SITE_OTHER): Payer: BLUE CROSS/BLUE SHIELD | Admitting: Internal Medicine

## 2017-10-18 ENCOUNTER — Encounter: Payer: Self-pay | Admitting: Internal Medicine

## 2017-10-18 VITALS — BP 136/84 | HR 87 | Temp 98.3°F | Resp 12 | Wt 182.5 lb

## 2017-10-18 DIAGNOSIS — J101 Influenza due to other identified influenza virus with other respiratory manifestations: Secondary | ICD-10-CM

## 2017-10-18 NOTE — Assessment & Plan Note (Signed)
Symptoms may all have been from this--though too late to treat and ongoing problems. Diagnosed with CAP but I am not sure about this diagnosis Seems to have some sinus symptoms still Reassured that chest is clear If not better next week, would check CXR (if still SOB), or give augmentin (if still in head)

## 2017-10-18 NOTE — Progress Notes (Signed)
Subjective:    Patient ID: Cassandra Cox, female    DOB: 06-11-1961, 56 y.o.   MRN: [id number]  HPI Here for follow up after urgent care visit  Started with illness 1-2 days Able to go to work Then felt bad enough that she had to stay in bed Fever, chills and sweats Felt weak Some nausea but no vomiting Some SOB--this has persisted. Heavy feeling on chest (like when going up stairs) No cough Did check positive for type B flu on 11/29  No Rx for flu Got z-pak for CAP--but no x-ray done  Still with head congestion and drainage Weakness and other symptoms have improved Still feels like there may be some fluid SOB is improved  Current Outpatient Medications on File Prior to Visit  Medication Sig Dispense Refill  . ALPRAZolam (XANAX) 1 MG tablet Take 1 mg by mouth at bedtime as needed.      Marland Kitchen aspirin EC 81 MG tablet Take 81 mg by mouth daily.    Marland Kitchen atorvastatin (LIPITOR) 80 MG tablet Take 1 tablet (80 mg total) by mouth daily at 6 PM. 90 tablet 3  . bisoprolol (ZEBETA) 5 MG tablet Take 1 tablet (5 mg total) by mouth daily. 90 tablet 3  . BUPROPION HCL ER, XL, PO Take 200 mg daily by mouth.     . Cholecalciferol (VITAMIN D-3) 5000 UNITS TABS Take 2 tablets daily by mouth.     . Cyanocobalamin 1000 MCG/ML LIQD Inject 1 mL as directed once a week.    . doxycycline (VIBRAMYCIN) 100 MG capsule Take 100 mg by mouth 2 (two) times daily.  0  . levocetirizine (XYZAL) 5 MG tablet Take 1 tablet at bedtime by mouth.  2  . magnesium gluconate (MAGONATE) 500 MG tablet Take 1,000 mg by mouth at bedtime.     . metFORMIN (GLUCOPHAGE) 500 MG tablet Take 500 mg by mouth 2 (two) times daily with a meal.    . montelukast (SINGULAIR) 10 MG tablet Take 10 mg by mouth at bedtime.    Marland Kitchen NALTREXONE HCL PO Take 3.5 mg at bedtime by mouth.    . nitroGLYCERIN (NITROSTAT) 0.4 MG SL tablet Place 1 tablet (0.4 mg total) under the tongue every 5 (five) minutes as needed for chest pain. 30 tablet 0  .  progesterone (PROMETRIUM) 200 MG capsule Take 200 mg at bedtime by mouth.  2  . ticagrelor (BRILINTA) 90 MG TABS tablet Take 1 tablet (90 mg total) by mouth 2 (two) times daily. 180 tablet 3  . zolpidem (AMBIEN CR) 12.5 MG CR tablet Take 12.5 mg by mouth at bedtime as needed for sleep.    Marland Kitchen ezetimibe (ZETIA) 10 MG tablet Take 1 tablet (10 mg total) by mouth daily. 90 tablet 3   No current facility-administered medications on file prior to visit.     Allergies  Allergen Reactions  . Prednisone     REACTION: Swelling  . Sulfonamide Derivatives     REACTION: Hives    Past Medical History:  Diagnosis Date  . Coronary artery disease    a. 11/2016 Cath/PCI: LM nl, LAD 30ost, 45m (3.0x18 Resolute Integrity DES--3.75), LCX nl, RCA nl.  . Depression   . Diastolic dysfunction    a. 11/2016 Echo: EF 55-60%, Gr2 DD, LAE, nl RV size/fxn.  . Hyperlipemia \\[id number] \  . Kidney stone 08/2016  . MVP (mitral valve prolapse)   . STEMI (ST elevation myocardial infarction) ([place]) 12/02/2016  . Vitamin B 12  deficiency     Past Surgical History:  Procedure Laterality Date  . ABDOMINAL HYSTERECTOMY  2003   TAH due to adenomyosis  . BREAST REDUCTION SURGERY  1990  . CARDIAC CATHETERIZATION N/A 12/02/2016   Procedure: Coronary Stent Intervention;  Surgeon: Nelva Bush, MD;  Location: [place] [facility];  Service: Cardiovascular;  Laterality: N/A;  . CARDIAC CATHETERIZATION N/A 12/02/2016   Procedure: Left Heart Cath and Coronary Angiography;  Surgeon: Nelva Bush, MD;  Location: [place] [facility];  Service: Cardiovascular;  Laterality: N/A;  . CARDIAC CATHETERIZATION N/A 12/02/2016   Procedure: Intravascular Ultrasound/IVUS;  Surgeon: Nelva Bush, MD;  Location: [place] [facility];  Service: Cardiovascular;  Laterality: N/A;  . CORONARY STENT INTERVENTION      Family History  Problem Relation Age of Onset  . Heart disease Mother   . Diabetes Mother   . Stroke Mother   . Heart  attack Mother   . Atrial fibrillation Mother   . Hypertension Mother   . Hyperlipidemia Mother   . COPD Father   . Hypertension Father   . Hyperlipidemia Father   . Breast cancer Maternal Aunt 92       x2 age 34  . Diabetes Maternal Grandfather     Social History   Socioeconomic History  . Marital status: Divorced    Spouse name: Not on file  . Number of children: Not on file  . Years of education: Not on file  . Highest education level: Not on file  Social Needs  . Financial resource strain: Not on file  . Food insecurity - worry: Not on file  . Food insecurity - inability: Not on file  . Transportation needs - medical: Not on file  . Transportation needs - non-medical: Not on file  Occupational History    Employer: [employer]  Tobacco Use  . Smoking status: Never Smoker  . Smokeless tobacco: Never Used  Substance and Sexual Activity  . Alcohol use: No  . Drug use: No  . Sexual activity: Yes    Birth control/protection: Surgical  Other Topics Concern  . Not on file  Social History Narrative  . Not on file   Review of Systems No rash Appetite is okay    Objective:   Physical Exam  Constitutional: She appears well-developed. No distress.  HENT:  Mild maxillary tenderness Nasal inflammation TMs normal  Mild pharyngeal injection without enlarged tonsils or exudate  Neck: No thyromegaly present.  Slight tender along anterior cervical chain  Pulmonary/Chest: Effort normal and breath sounds normal. No respiratory distress. She has no wheezes. She has no rales.  Lymphadenopathy:    She has no cervical adenopathy.          Assessment & Plan:

## 2017-10-18 NOTE — Patient Instructions (Signed)
If you remain short of breath next week, let me know and I will order a chest x-ray. If the sinus symptoms persist, I would try a different antibiotic.

## 2017-11-16 ENCOUNTER — Ambulatory Visit: Payer: BLUE CROSS/BLUE SHIELD | Admitting: Physician Assistant

## 2017-11-16 ENCOUNTER — Encounter: Payer: Self-pay | Admitting: Physician Assistant

## 2017-11-16 VITALS — BP 120/70 | HR 98 | Ht 63.0 in | Wt 180.5 lb

## 2017-11-16 DIAGNOSIS — I2510 Atherosclerotic heart disease of native coronary artery without angina pectoris: Secondary | ICD-10-CM

## 2017-11-16 DIAGNOSIS — I519 Heart disease, unspecified: Secondary | ICD-10-CM | POA: Diagnosis not present

## 2017-11-16 DIAGNOSIS — I872 Venous insufficiency (chronic) (peripheral): Secondary | ICD-10-CM

## 2017-11-16 DIAGNOSIS — E785 Hyperlipidemia, unspecified: Secondary | ICD-10-CM

## 2017-11-16 DIAGNOSIS — I5189 Other ill-defined heart diseases: Secondary | ICD-10-CM

## 2017-11-16 NOTE — Patient Instructions (Signed)
Medication Instructions: - Please take your bisoprolol 5 mg- 1 tablet at bedtime over the weekend (call the office on Monday to let [place], [place] know how you are doing with that)  - please research Imdur MN (isosorbide mononitrate)   Labwork: - none ordered  Procedures/Testing: - none ordered  Follow-Up: - Your physician wants you to follow-up in: 3 months with Dr. Saunders Revel. You will receive a reminder letter in the mail two months in advance. If you don't receive a letter, please call our office to schedule the follow-up appointment.   Any Additional Special Instructions Will Be Listed Below (If Applicable).     If you need a refill on your cardiac medications before your next appointment, please call your pharmacy.

## 2017-11-16 NOTE — Progress Notes (Signed)
Cardiology Office Note Date:  11/16/2017  Patient ID:  Cassandra Cox 1961-06-04, MRN [id number] PCP:  Lucille Passy, MD  Cardiologist:  Dr. Saunders Revel, MD    Chief Complaint: Hospital follow up  History of Present Illness: Cassandra Cox is a 57 y.o. female with history of CAD s/p anterior ST elevation MI with PCI/DES to the LAD in 11/2016, DD, HLD, and nephrolithiasis who presents for hospital follow up of recent admission to [facility] from 11/9-11/9 for chest pain.   She suffered an anterior STEMI in 11/2016 and was found to have a subtotal occlusion of the mid LAD and otherwise nonobstructive disease. The LAD was successfully treated with PCI/DES. Echo showed normal EF with Gr2DD. She had done reasonably well post-MI and was last seen in clinic in September, at which time she reported intermittent chest discomfort when "rushing or anxious." She was placed on low dose bisoprolol (fatigue with metoprolol) due to concern for microvascular angina or angina potentially stemming from a jailed diagonal branch. Following that visit, she never started bisoprolol as she had no recurrence of chest pain and HR/BP stable. She was admitted 11/9 with discomfort between her shoulder blades that week (possibly positional) that was also intermittently associated with chest heaviness/tightness that radiated to her jaw. Symptoms were mildly similar to her prior MI. Troponin negative x 2. EKG not acute. She underwent Lexiscan Myoview 09/14/2017 that was negative for significant ischemia, EF 72%, no EKG changes concerning for ischemia at stress or recovery, overall low risk scan. She was continued on ASA, Brilinta, Lipitor, bisoprolol, Zetia, and SL NTG prn along with her non-cardiac medications. She has since been seen by PCP most recently on 10/18/17 for influenza B follow up (diagnosed at [facility]) 11/29, not placed on anti-viral medication. Treated with Z-pack for possible CAP without CXR done. Still noted some  nasal congestion and drainage with PCP with improved SOB.   She comes in doing well today. She has not had any further chest pain since her admission in 09/2017. Still not taking bisoprolol as she is concerned this may lead to fatigue. Tolerating DAPT with ASA and Brilinta, has not missed any doses. Her mild DOE has also improved significantly. She denies any palpitations or lightheadedness. She does note mild lower extremity swelling after she has been on her feet for an extended time period. Swelling improves overnight while laying down. Weight has been stable. No orthopnea, cough, or early satiety.    Past Medical History:  Diagnosis Date  . Coronary artery disease    a. 11/2016 Cath/PCI: LM nl, LAD 30ost, 43m (3.0x18 Resolute Integrity DES--3.75), LCX nl, RCA nl; b. MV 11/18: negative for ischemia, EF 72%, low risk  . Depression   . Diastolic dysfunction    a. 11/2016 Echo: EF 55-60%, Gr2 DD, LAE, nl RV size/fxn.  . Hyperlipemia \\[id number] \  . Kidney stone 08/2016  . MVP (mitral valve prolapse)   . STEMI (ST elevation myocardial infarction) ([place]) 12/02/2016  . Vitamin B 12 deficiency     Past Surgical History:  Procedure Laterality Date  . ABDOMINAL HYSTERECTOMY  2003   TAH due to adenomyosis  . BREAST REDUCTION SURGERY  1990  . CARDIAC CATHETERIZATION N/A 12/02/2016   Procedure: Coronary Stent Intervention;  Surgeon: Nelva Bush, MD;  Location: [place] [facility];  Service: Cardiovascular;  Laterality: N/A;  . CARDIAC CATHETERIZATION N/A 12/02/2016   Procedure: Left Heart Cath and Coronary Angiography;  Surgeon: Nelva Bush, MD;  Location:  [place] [facility];  Service: Cardiovascular;  Laterality: N/A;  . CARDIAC CATHETERIZATION N/A 12/02/2016   Procedure: Intravascular Ultrasound/IVUS;  Surgeon: Nelva Bush, MD;  Location: [place] [facility];  Service: Cardiovascular;  Laterality: N/A;  . CORONARY STENT INTERVENTION      Current Meds  Medication Sig  . ALPRAZolam  (XANAX) 1 MG tablet Take 1 mg by mouth at bedtime as needed.    Marland Kitchen aspirin EC 81 MG tablet Take 81 mg by mouth daily.  Marland Kitchen atorvastatin (LIPITOR) 80 MG tablet Take 1 tablet (80 mg total) by mouth daily at 6 PM.  . BUPROPION HCL ER, XL, PO Take 200 mg daily by mouth.   . Cholecalciferol (VITAMIN D-3) 5000 UNITS TABS Take 2 tablets daily by mouth.   . Cyanocobalamin 1000 MCG/ML LIQD Inject 1 mL as directed once a week.  . ezetimibe (ZETIA) 10 MG tablet Take 1 tablet (10 mg total) by mouth daily.  Marland Kitchen levocetirizine (XYZAL) 5 MG tablet Take 1 tablet at bedtime by mouth.  . magnesium gluconate (MAGONATE) 500 MG tablet Take 1,000 mg by mouth at bedtime.   . metFORMIN (GLUCOPHAGE) 500 MG tablet Take 500 mg by mouth 2 (two) times daily with a meal.  . montelukast (SINGULAIR) 10 MG tablet Take 10 mg by mouth at bedtime.  Marland Kitchen NALTREXONE HCL PO Take 3.5 mg at bedtime by mouth.  . nitroGLYCERIN (NITROSTAT) 0.4 MG SL tablet Place 1 tablet (0.4 mg total) under the tongue every 5 (five) minutes as needed for chest pain.  . progesterone (PROMETRIUM) 200 MG capsule Take 200 mg at bedtime by mouth.  . ticagrelor (BRILINTA) 90 MG TABS tablet Take 1 tablet (90 mg total) by mouth 2 (two) times daily.  Marland Kitchen zolpidem (AMBIEN CR) 12.5 MG CR tablet Take 12.5 mg by mouth at bedtime as needed for sleep.    Allergies:   Prednisone; Sulfa antibiotics; Sulfasalazine; and Sulfonamide derivatives   Social History:  The patient  reports that  has never smoked. she has never used smokeless tobacco. She reports that she does not drink alcohol or use drugs.   Family History:  The patient's family history includes Atrial fibrillation in her mother; Breast cancer (age of onset: 86) in her maternal aunt; COPD in her father; Diabetes in her maternal grandfather and mother; Heart attack in her mother; Heart disease in her mother; Hyperlipidemia in her father and mother; Hypertension in her father and mother; Stroke in her mother.  ROS:     Review of Systems  Constitutional: Negative for chills, diaphoresis, fever, malaise/fatigue and weight loss.  HENT: Negative for congestion.   Eyes: Negative for discharge and redness.  Respiratory: Negative for cough, hemoptysis, sputum production, shortness of breath and wheezing.   Cardiovascular: Positive for leg swelling. Negative for chest pain, palpitations, orthopnea, claudication and PND.  Gastrointestinal: Negative for abdominal pain, blood in stool, heartburn, melena, nausea and vomiting.  Genitourinary: Negative for hematuria.  Musculoskeletal: Negative for falls and myalgias.  Skin: Negative for rash.  Neurological: Negative for dizziness, tingling, tremors, sensory change, speech change, focal weakness, loss of consciousness and weakness.  Endo/Heme/Allergies: Does not bruise/bleed easily.  Psychiatric/Behavioral: Negative for substance abuse. The patient is nervous/anxious.   All other systems reviewed and are negative.    PHYSICAL EXAM:  VS:  BP 120/70 (BP Location: Left Arm, Patient Position: Sitting, Cuff Size: Normal)   Pulse 99   Ht 5\' 3"  (1.6 m)   Wt 180 lb 8 oz (81.9 kg)  BMI 31.97 kg/m  BMI: Body mass index is 31.97 kg/m.  Physical Exam  Constitutional: She is oriented to person, place, and time. She appears well-developed and well-nourished.  HENT:  Head: Normocephalic and atraumatic.  Eyes: Right eye exhibits no discharge. Left eye exhibits no discharge.  Neck: Normal range of motion. No JVD present.  Cardiovascular: Normal rate, regular rhythm, S1 normal, S2 normal and normal heart sounds. Exam reveals no distant heart sounds, no friction rub, no midsystolic click and no opening snap.  No murmur heard. Pulses:      Posterior tibial pulses are 2+ on the right side, and 2+ on the left side.  Pulmonary/Chest: Effort normal and breath sounds normal. No respiratory distress. She has no decreased breath sounds. She has no wheezes. She has no rales. She  exhibits no tenderness.  Abdominal: Soft. She exhibits no distension. There is no tenderness.  Musculoskeletal: She exhibits edema.  Trace pre-tibial edema bilaterally to the lower 1/4 shins.   Neurological: She is alert and oriented to person, place, and time.  Skin: Skin is warm and dry. No cyanosis. Nails show no clubbing.  Psychiatric: She has a normal mood and affect. Her speech is normal and behavior is normal. Judgment and thought content normal.     EKG:  Was ordered and interpreted by me today. Shows NSR, 98 bpm, nonspecific st/t changes (unchanged from prior study)  Recent Labs: 12/03/2016: TSH 2.411 12/04/2016: Magnesium 1.7 09/14/2017: ALT 29; BUN 18; Creatinine, Ser 0.74; Hemoglobin 12.3; Platelets 315; Potassium 3.5; Sodium 137  09/14/2017: Cholesterol 138; HDL 60; LDL Cholesterol 69; Total CHOL/HDL Ratio 2.3; Triglycerides 43; VLDL 9   CrCl cannot be calculated (Patient's most recent lab result is older than the maximum 21 days allowed.).   Wt Readings from Last 3 Encounters:  11/16/17 180 lb 8 oz (81.9 kg)  10/18/17 182 lb 8 oz (82.8 kg)  09/25/17 184 lb (83.5 kg)     Other studies reviewed: Additional studies/records reviewed today include: summarized above  ASSESSMENT AND PLAN:  1. CAD in native coronary artery without angina: No further symptoms concerning for angina. No further chest pain. Continue DAPT with ASA 81 mg daily and Brilinta 90 mg bid. Her 12 months of DAPT will conclude at the end of 11/2017, I will defer possible discontinuation/dose reduction of Brilinta to her primary cardiologist given location of stent. She has not yet started bisoprolol. We had along discussion today regarding the importance of beta blockers in CAD. She has agreed to try bisoprolol in the evening time over the next 3 days (weekend) and call on 11/19/17 with her tolerance. Recent normal Myoview as above. No plans for further ischemia work up at this time. If she has a return of chest  pain or is intolerant to bisoprolol, consider Imdur. Aggressive risk factor modification.   2. Diastolic dysfunction: Noted on echo 09/2017. She does not appear volume overloaded at this time. Not on standing diuretic. CHF education.   3. Lower extremity swelling: Likely venous insufficiency. Recent echo as above. Elevated legs. Consider compression hose.   4. HLD: Lipitor 80 mg daily along with Zetia 10 mg daily. Most recent LDL of 69 in 09/2017.   Disposition: F/u with Dr. Saunders Revel in 3 months. Call on 11/19/17 to report tolerance of bisoprolol.   Current medicines are reviewed at length with the patient today.  The patient did not have any concerns regarding medicines.  Melvern Banker PA-C 11/16/2017 2:08 PM  [address] [place] [address] [place], [place] [address] [phone]

## 2017-12-08 ENCOUNTER — Ambulatory Visit: Payer: Self-pay | Admitting: Family Medicine

## 2018-01-28 ENCOUNTER — Encounter: Payer: Self-pay | Admitting: Family Medicine

## 2018-02-06 ENCOUNTER — Other Ambulatory Visit: Payer: Self-pay | Admitting: Physician Assistant

## 2018-02-06 DIAGNOSIS — R1310 Dysphagia, unspecified: Secondary | ICD-10-CM

## 2018-02-06 DIAGNOSIS — R748 Abnormal levels of other serum enzymes: Secondary | ICD-10-CM

## 2018-02-06 DIAGNOSIS — K7581 Nonalcoholic steatohepatitis (NASH): Secondary | ICD-10-CM

## 2018-03-01 ENCOUNTER — Other Ambulatory Visit: Payer: Self-pay | Admitting: Internal Medicine

## 2018-03-01 MED ORDER — EZETIMIBE 10 MG PO TABS
10.0000 mg | ORAL_TABLET | Freq: Every day | ORAL | 2 refills | Status: DC
Start: 2018-03-01 — End: 2018-06-26

## 2018-03-13 ENCOUNTER — Encounter: Payer: Self-pay | Admitting: Internal Medicine

## 2018-03-13 ENCOUNTER — Ambulatory Visit: Payer: BLUE CROSS/BLUE SHIELD | Admitting: Internal Medicine

## 2018-03-13 VITALS — BP 130/80 | HR 90 | Ht 63.0 in | Wt 169.8 lb

## 2018-03-13 DIAGNOSIS — E785 Hyperlipidemia, unspecified: Secondary | ICD-10-CM

## 2018-03-13 DIAGNOSIS — I2510 Atherosclerotic heart disease of native coronary artery without angina pectoris: Secondary | ICD-10-CM

## 2018-03-13 MED ORDER — NITROGLYCERIN 0.4 MG SL SUBL: 0.4000 mg | SUBLINGUAL_TABLET | SUBLINGUAL | 3 refills | Status: DC | PRN

## 2018-03-13 MED ORDER — CLOPIDOGREL BISULFATE 75 MG PO TABS: 75.0000 mg | ORAL_TABLET | Freq: Every day | ORAL | 3 refills | Status: DC

## 2018-03-13 NOTE — Progress Notes (Signed)
Follow-up Outpatient Visit Date: 03/13/2018  Primary Care Provider: Lucille Passy, MD [place] [address]  Chief Complaint: Follow-up coronary artery disease  HPI:  [name] is a 57 y.o. year-old female with history of artery disease status post anterior STEMI in 11/2016, hyperlipidemia, and anxiety, who presents for follow-up of coronary artery disease.  I last saw her in September, at which time she was doing well.  She was admitted in November with chest pain similar to her MI the preceding January, albeit less severe, and ruled out by troponins.  Myocardial perfusion stress test was normal.  Today, [name] happily reports that she has not had any further episodes of chest pain.  She also denies shortness of breath, palpitations, lightheadedness, and edema.  She was previously prescribed bisoprolol but never started this, as she had experienced fatigue with beta-blockers in the past.  She has not needed to use any sublingual nitroglycerin.  --------------------------------------------------------------------------------------------------  Cardiovascular History & Procedures: Cardiovascular Problems:  Coronary artery disease status post anterior STEMI (11/2016)  Risk Factors:  Known CAD and hyperlipidemia  Cath/PCI:  LHC/PCI (12/02/16): LMCA normal. LAD 30% ostial and 99% mid vessel disease. LCx and RCA without significant disease. Successful IVUS-guided PCI to mid LAD with placement of integrity resolute 3.0 x 18 mm drug-eluting stent postdilated with 3.75 mm [place] balloon.  CV Surgery:  None  EP Procedures and Devices:  None  Non-Invasive Evaluation(s):  Pharmacologic MPI (09/14/2017): Normal study without ischemia or scar.  LVEF 72%.  TTE (12/03/16): Normal LV size with mild LVH. LVEF 55-60% with grade 2 diastolic dysfunction. Left atrial enlargement. Normal RV size and function. No significant valvular abnormalities.  Recent CV Pertinent  Labs: Lab Results  Component Value Date   CHOL 138 09/14/2017   HDL 60 09/14/2017   LDLCALC 69 09/14/2017   LDLDIRECT 142.2 12/04/2013   TRIG 43 09/14/2017   CHOLHDL 2.3 09/14/2017   INR 1.03 09/14/2017   K 3.5 09/14/2017   MG 1.7 12/04/2016   BUN 18 09/14/2017   CREATININE 0.74 09/14/2017    Past medical and surgical history were reviewed and updated in EPIC.  Current Meds  Medication Sig  . ALPRAZolam (XANAX) 1 MG tablet Take 1 mg by mouth at bedtime as needed.    Marland Kitchen aspirin EC 81 MG tablet Take 81 mg by mouth daily.  Marland Kitchen atorvastatin (LIPITOR) 80 MG tablet Take 1 tablet (80 mg total) by mouth daily at 6 PM.  . BUPROPION HCL ER, XL, PO Take 200 mg daily by mouth.   . Cholecalciferol (VITAMIN D-3) 5000 UNITS TABS Take 2 tablets daily by mouth.   . Cyanocobalamin 1000 MCG/ML LIQD Inject 1 mL as directed once a week.  . ezetimibe (ZETIA) 10 MG tablet Take 1 tablet (10 mg total) by mouth daily.  Marland Kitchen levocetirizine (XYZAL) 5 MG tablet Take 1 tablet at bedtime by mouth.  . magnesium gluconate (MAGONATE) 500 MG tablet Take 1,000 mg by mouth at bedtime.   . metFORMIN (GLUCOPHAGE) 500 MG tablet Take 500 mg by mouth 2 (two) times daily with a meal.  . montelukast (SINGULAIR) 10 MG tablet Take 10 mg by mouth at bedtime.  Marland Kitchen NALTREXONE HCL PO Take 3.5 mg at bedtime by mouth.  . nitroGLYCERIN (NITROSTAT) 0.4 MG SL tablet Place 1 tablet (0.4 mg total) under the tongue every 5 (five) minutes as needed for chest pain. For maximum of 3 doses.  . progesterone (PROMETRIUM) 200 MG capsule Take  200 mg at bedtime by mouth.  . ticagrelor (BRILINTA) 90 MG TABS tablet Take 1 tablet (90 mg total) by mouth 2 (two) times daily.  Marland Kitchen zolpidem (AMBIEN CR) 12.5 MG CR tablet Take 12.5 mg by mouth at bedtime as needed for sleep.  . [DISCONTINUED] bisoprolol (ZEBETA) 5 MG tablet Take 1 tablet (5 mg total) by mouth daily.  . [DISCONTINUED] nitroGLYCERIN (NITROSTAT) 0.4 MG SL tablet Place 1 tablet (0.4 mg total) under  the tongue every 5 (five) minutes as needed for chest pain.    Allergies: Prednisone; Sulfa antibiotics; Sulfasalazine; and Sulfonamide derivatives  Social History   Tobacco Use  . Smoking status: Never Smoker  . Smokeless tobacco: Never Used  Substance Use Topics  . Alcohol use: No  . Drug use: No    Family History  Problem Relation Age of Onset  . Heart disease Mother   . Diabetes Mother   . Stroke Mother   . Heart attack Mother   . Atrial fibrillation Mother   . Hypertension Mother   . Hyperlipidemia Mother   . COPD Father   . Hypertension Father   . Hyperlipidemia Father   . Breast cancer Maternal Aunt 14       x2 age 87  . Diabetes Maternal Grandfather     Review of Systems: [name] notes easy bruising but no significant bleeding.  Otherwise, a 12-system review of systems was performed and was negative except as noted in the HPI.  --------------------------------------------------------------------------------------------------  Physical Exam: BP 130/80 (BP Location: Left Arm, Patient Position: Sitting, Cuff Size: Normal)   Pulse 90   Ht 5' 3"  (1.6 m)   Wt 169 lb 12 oz (77 kg)   BMI 30.07 kg/m   General: NAD. HEENT: No conjunctival pallor or scleral icterus. Moist mucous membranes.  OP clear. Neck: Supple without lymphadenopathy, thyromegaly, JVD, or HJR. Lungs: Normal work of breathing. Clear to auscultation bilaterally without wheezes or crackles. Heart: Regular rate and rhythm without murmurs, rubs, or gallops. Non-displaced PMI. Abd: Bowel sounds present. Soft, NT/ND without hepatosplenomegaly Ext: No lower extremity edema. Radial, PT, and DP pulses are 2+ bilaterally. Skin: Warm and dry without rash.  EKG: Normal sinus rhythm with nonspecific ST/T changes.  No significant change since 11/16/2017.  Lab Results  Component Value Date   WBC 10.2 09/14/2017   HGB 12.3 09/14/2017   HCT 37.1 09/14/2017   MCV 94.3 09/14/2017   PLT 315 09/14/2017     Lab Results  Component Value Date   NA 137 09/14/2017   K 3.5 09/14/2017   CL 104 09/14/2017   CO2 23 09/14/2017   BUN 18 09/14/2017   CREATININE 0.74 09/14/2017   GLUCOSE 91 09/14/2017   ALT 29 09/14/2017    Lab Results  Component Value Date   CHOL 138 09/14/2017   HDL 60 09/14/2017   LDLCALC 69 09/14/2017   LDLDIRECT 142.2 12/04/2013   TRIG 43 09/14/2017   CHOLHDL 2.3 09/14/2017    --------------------------------------------------------------------------------------------------  ASSESSMENT AND PLAN: Coronary artery disease without angina Mr. Cassandra Cox has recovered well from her STEMI last January.  She had one episode of recurrent chest pain leading to hospitalization in November.  Work-up at that time was unremarkable with normal myocardial perfusion stress test.  Fortunately, she has had no further angina.  We have agreed to continue indefinite dual antiplatelet therapy for secondary prevention, though we will switch from ticagrelor to clopidogrel 75 mg daily once she has exhausted her current supply of ticagrelor.  Clopidogrel could be stopped, if necessary, for invasive procedures.  Given fatigue in the past with beta-blockers and lack of further chest pain, we will defer adding bisoprolol.  Hyperlipidemia LDL was at goal most recently.  Continue atorvastatin and ezetimibe.  Follow-up: Return to clinic in 6 months.  Nelva Bush, MD 03/13/2018 2:54 PM

## 2018-03-13 NOTE — Patient Instructions (Signed)
Medication Instructions:  Your physician has recommended you make the following change in your medication:  1- REMAIN off Bisoprolol. 2- START Plavix 75 mg by mouth once a day, once you have used up all your Brilinta.    Labwork: none  Testing/Procedures: none  Follow-Up: Your physician wants you to follow-up in: 6 MONTHS WITH DR END. You will receive a reminder letter in the mail two months in advance. If you don't receive a letter, please call our office to schedule the follow-up appointment.   If you need a refill on your cardiac medications before your next appointment, please call your pharmacy.

## 2018-03-14 ENCOUNTER — Encounter: Payer: Self-pay | Admitting: Internal Medicine

## 2018-06-26 ENCOUNTER — Other Ambulatory Visit: Payer: Self-pay | Admitting: Internal Medicine

## 2018-07-10 ENCOUNTER — Other Ambulatory Visit: Payer: Self-pay

## 2018-07-10 MED ORDER — ATORVASTATIN CALCIUM 80 MG PO TABS: 80.0000 mg | ORAL_TABLET | Freq: Every day | ORAL | 0 refills | Status: DC

## 2018-07-10 NOTE — Telephone Encounter (Signed)
Refill Atorvastatin 80

## 2018-09-09 ENCOUNTER — Encounter: Payer: Self-pay | Admitting: Internal Medicine

## 2018-09-09 ENCOUNTER — Ambulatory Visit: Payer: BLUE CROSS/BLUE SHIELD | Admitting: Internal Medicine

## 2018-09-09 VITALS — BP 128/78 | HR 90 | Ht 63.0 in | Wt 189.8 lb

## 2018-09-09 DIAGNOSIS — R030 Elevated blood-pressure reading, without diagnosis of hypertension: Secondary | ICD-10-CM | POA: Diagnosis not present

## 2018-09-09 DIAGNOSIS — E785 Hyperlipidemia, unspecified: Secondary | ICD-10-CM

## 2018-09-09 DIAGNOSIS — I10 Essential (primary) hypertension: Secondary | ICD-10-CM | POA: Insufficient documentation

## 2018-09-09 DIAGNOSIS — I2510 Atherosclerotic heart disease of native coronary artery without angina pectoris: Secondary | ICD-10-CM

## 2018-09-09 NOTE — Progress Notes (Signed)
Follow-up Outpatient Visit Date: 09/09/2018  Primary Care Provider: Lucille Passy, MD [place] [address]  Chief Complaint: Follow-up coronary artery disease  HPI:  Ms. Krinsky is a 57 y.o. year-old female with history of coronary artery disease status post anterior STEMI (11/2016), hyperlipidemia, and anxiety, who presents for follow-up of coronary artery disease.  I last saw her in May, at which time she was doing well without further episodes of chest pain.  At that time, we agreed to switch ticagrelor to clopidogrel for indefinite DAPT.  Given history of fatigue with beta-blockers, she was not rechallenged.  Today, Ms. Kyllonen reports feeling relatively well.  She experienced sporadic palpitations around the time that her mother was hospitalized earlier this year.  This is most noticeable when she was sitting still at home.  She describes the sensation as flutters lasting a few seconds at a time.  With improved control of her anxiety after switching from bupropion to Pristiq, her symptoms have abated.  She denies chest pain, shortness of breath, lightheadedness, edema, and bleeding.  She is tolerating dual antiplatelet therapy with aspirin and clopidogrel well.  --------------------------------------------------------------------------------------------------  Cardiovascular History & Procedures: Cardiovascular Problems:  Coronary artery disease status post anterior STEMI (11/2016)  Risk Factors:  Known CAD and hyperlipidemia  Cath/PCI:  LHC/PCI (12/02/16): LMCA normal. LAD 30% ostial and 99% mid vessel disease. LCx and RCA without significant disease. Successful IVUS-guided PCI to mid LAD with placement of integrity resolute 3.0 x 18 mm drug-eluting stent postdilated with 3.75 mm [place] balloon.  CV Surgery:  None  EP Procedures and Devices:  None  Non-Invasive Evaluation(s):  Pharmacologic MPI (09/14/2017): Normal study without ischemia or scar.   LVEF 72%.  TTE (12/03/16): Normal LV size with mild LVH. LVEF 55-60% with grade 2 diastolic dysfunction. Left atrial enlargement. Normal RV size and function. No significant valvular abnormalities.  Recent CV Pertinent Labs: Lab Results  Component Value Date   CHOL 138 09/14/2017   HDL 60 09/14/2017   LDLCALC 69 09/14/2017   LDLDIRECT 142.2 12/04/2013   TRIG 43 09/14/2017   CHOLHDL 2.3 09/14/2017   INR 1.03 09/14/2017   K 3.5 09/14/2017   MG 1.7 12/04/2016   BUN 18 09/14/2017   CREATININE 0.74 09/14/2017    Past medical and surgical history were reviewed and updated in EPIC.  Current Meds  Medication Sig  . ALPRAZolam (XANAX) 1 MG tablet Take 1 mg by mouth at bedtime as needed.    Marland Kitchen aspirin EC 81 MG tablet Take 81 mg by mouth daily.  Marland Kitchen atorvastatin (LIPITOR) 80 MG tablet Take 1 tablet (80 mg total) by mouth daily at 6 PM.  . BUPROPION HCL ER, XL, PO Take 200 mg daily by mouth.   . Cholecalciferol (VITAMIN D-3) 5000 UNITS TABS Take 2 tablets daily by mouth.   . clopidogrel (PLAVIX) 75 MG tablet Take 1 tablet (75 mg total) by mouth daily. Start once you have ran out of all your Brilinta.  . Cyanocobalamin 1000 MCG/ML LIQD Inject 1 mL as directed once a week.  . ezetimibe (ZETIA) 10 MG tablet TAKE 1 TABLET BY MOUTH DAILY. GENERIC EQUIVALENT FOR ZETIA  . levocetirizine (XYZAL) 5 MG tablet Take 1 tablet at bedtime by mouth.  . magnesium gluconate (MAGONATE) 500 MG tablet Take 1,000 mg by mouth at bedtime.   . metFORMIN (GLUCOPHAGE) 500 MG tablet Take 500 mg by mouth 2 (two) times daily with a meal.  . montelukast (SINGULAIR) 10  MG tablet Take 10 mg by mouth at bedtime.  Marland Kitchen NALTREXONE HCL PO Take 3.5 mg at bedtime by mouth.  . nitroGLYCERIN (NITROSTAT) 0.4 MG SL tablet Place 1 tablet (0.4 mg total) under the tongue every 5 (five) minutes as needed for chest pain. For maximum of 3 doses.  . progesterone (PROMETRIUM) 200 MG capsule Take 200 mg at bedtime by mouth.  . ticagrelor  (BRILINTA) 90 MG TABS tablet Take 1 tablet (90 mg total) by mouth 2 (two) times daily.  Marland Kitchen zolpidem (AMBIEN CR) 12.5 MG CR tablet Take 12.5 mg by mouth at bedtime as needed for sleep.    Allergies: Prednisone; Sulfa antibiotics; Sulfasalazine; and Sulfonamide derivatives  Social History   Tobacco Use  . Smoking status: Never Smoker  . Smokeless tobacco: Never Used  Substance Use Topics  . Alcohol use: No  . Drug use: No    Family History  Problem Relation Age of Onset  . Heart disease Mother   . Diabetes Mother   . Stroke Mother   . Heart attack Mother   . Atrial fibrillation Mother   . Hypertension Mother   . Hyperlipidemia Mother   . COPD Father   . Hypertension Father   . Hyperlipidemia Father   . Breast cancer Maternal Aunt 60       x2 age 27  . Diabetes Maternal Grandfather     Review of Systems: A 12-system review of systems was performed and was negative except as noted in the HPI.  --------------------------------------------------------------------------------------------------  Physical Exam: BP (!) 148/82 (BP Location: Left Arm, Patient Position: Sitting, Cuff Size: Normal)   Pulse 90   Ht 5' 3"  (1.6 m)   Wt 189 lb 12 oz (86.1 kg)   BMI 33.61 kg/m  Repeat BP: 128/78  General: NAD. HEENT: No conjunctival pallor or scleral icterus. Moist mucous membranes.  OP clear. Neck: Supple without lymphadenopathy, thyromegaly, JVD, or HJR. Lungs: Normal work of breathing. Clear to auscultation bilaterally without wheezes or crackles. Heart: Regular rate and rhythm without murmurs, rubs, or gallops. Non-displaced PMI. Abd: Bowel sounds present. Soft, NT/ND without hepatosplenomegaly Ext: No lower extremity edema. Skin: Warm and dry without rash.  EKG: Normal sinus rhythm with poor R wave progression in V1 and V2 and nonspecific ST segment changes.  Lab Results  Component Value Date   WBC 10.2 09/14/2017   HGB 12.3 09/14/2017   HCT 37.1 09/14/2017   MCV 94.3  09/14/2017   PLT 315 09/14/2017    Lab Results  Component Value Date   NA 137 09/14/2017   K 3.5 09/14/2017   CL 104 09/14/2017   CO2 23 09/14/2017   BUN 18 09/14/2017   CREATININE 0.74 09/14/2017   GLUCOSE 91 09/14/2017   ALT 29 09/14/2017    Lab Results  Component Value Date   CHOL 138 09/14/2017   HDL 60 09/14/2017   LDLCALC 69 09/14/2017   LDLDIRECT 142.2 12/04/2013   TRIG 43 09/14/2017   CHOLHDL 2.3 09/14/2017    --------------------------------------------------------------------------------------------------  ASSESSMENT AND PLAN: Coronary artery disease without angina No recurrence of chest pain.  Overall, Ms. Kaczorowski is doing well following her STEMI in 11/2016.  We will continue indefinite dual antiplatelet therapy with aspirin and clopidogrel, as tolerated, as aggressive secondary prevention with atorvastatin.  Given history of fatigue with beta-blockers, we will not rechallenge her.  Hyperlipidemia Most recent LDL a year ago was at goal.  Ms. Blas reports that she has had interval lab work done by  her provider in [place].  She will provide [place] with these results.  Lipid panel was not checked, will need to repeat this at her convenience.  Elevated blood pressure Blood pressure mildly elevated today on initial check but better on repeat assessment.  I encouraged sodium restriction and exercise.  Follow-up: Return to clinic in 6 months.  Nelva Bush, MD 09/09/2018 3:49 PM

## 2018-09-09 NOTE — Patient Instructions (Signed)
Medication Instructions:  Your physician recommends that you continue on your current medications as directed. Please refer to the Current Medication list given to you today.  If you need a refill on your cardiac medications before your next appointment, please call your pharmacy.   Lab work: Please bring your recent lab work by the office for Dr End to review at your earliest convenience.  If you have labs (blood work) drawn today and your tests are completely normal, you will receive your results only by: Marland Kitchen MyChart Message (if you have MyChart) OR . A paper copy in the mail If you have any lab test that is abnormal or we need to change your treatment, we will call you to review the results.  Testing/Procedures: none  Follow-Up: At [facility], you and your health needs are our priority.  As part of our continuing mission to provide you with exceptional heart care, we have created designated Provider Care Teams.  These Care Teams include your primary Cardiologist (physician) and Advanced Practice Providers (APPs -  Physician Assistants and Nurse Practitioners) who all work together to provide you with the care you need, when you need it. You will need a follow up appointment in 6 months.  Please call our office 2 months in advance to schedule this appointment.  You may see DR Harrell Gave END or one of the following Advanced Practice Providers on your designated Care Team:   Murray Hodgkins, NP Christell Faith, PA-C . Marrianne Mood, PA-C

## 2018-09-13 ENCOUNTER — Telehealth: Payer: Self-pay | Admitting: Internal Medicine

## 2018-09-13 DIAGNOSIS — E785 Hyperlipidemia, unspecified: Secondary | ICD-10-CM

## 2018-09-13 NOTE — Telephone Encounter (Signed)
Notes recorded by Nelva Bush, MD on 09/13/2018 at 8:42 AM EST Please let Ms. Esters know that I have reviewed the results of her outside labs. Extensive lipid particle testing was performed, though I do not see that a measure of total LDL was obtained. Given that we target our therapy to total LDL, I think it would be helpful to have this measurement. I recommend that we check a fasting lipid panel at her convenience to ensure that atorvastatin is adequately controlling her cholesterol. LFTs were normal.

## 2018-09-13 NOTE — Telephone Encounter (Signed)
I spoke with the patient.  She is aware of Dr. Darnelle Bos recommendations to have a FASTING lipid panel done as this was not checked on the panel of blood that she submitted for him to review.  The patient is agreeable with doing this. She would like to go to the [facility] on [address].  I advised her I will call and get the fax # and fax orders there for her.    Attempted to call [facility] on [address]- the office is currently closed. Will call back on Monday to obtain fax #.

## 2018-09-16 NOTE — Telephone Encounter (Signed)
Found fax number for [employer]	[place].  [phone].   Order faxed to [facility].

## 2018-09-18 ENCOUNTER — Other Ambulatory Visit: Payer: Self-pay | Admitting: Internal Medicine

## 2018-09-19 LAB — LIPID PANEL W/O CHOL/HDL RATIO
Cholesterol, Total: 155 mg/dL (ref 100–199)
HDL: 74 mg/dL (ref >39)
LDL Calculated: 64 mg/dL (ref 0–99)
Triglycerides: 85 mg/dL (ref 0–149)
VLDL Cholesterol Cal: 17 mg/dL (ref 5–40)

## 2018-09-19 LAB — SPECIMEN STATUS REPORT

## 2018-09-30 ENCOUNTER — Other Ambulatory Visit: Payer: Self-pay | Admitting: Cardiovascular Disease

## 2018-09-30 NOTE — Telephone Encounter (Signed)
Results received and reviewed by Dr End. Dr End sent MyChart message to patient. Closing encounter.

## 2018-10-22 ENCOUNTER — Other Ambulatory Visit: Payer: Self-pay | Admitting: Internal Medicine

## 2018-12-16 ENCOUNTER — Telehealth: Payer: Self-pay

## 2018-12-16 MED ORDER — EZETIMIBE 10 MG PO TABS: 10.0000 mg | ORAL_TABLET | Freq: Every day | ORAL | 3 refills | Status: DC

## 2018-12-16 NOTE — Telephone Encounter (Signed)
Refill sent for Zetia 10 mg

## 2019-04-05 ENCOUNTER — Other Ambulatory Visit: Payer: Self-pay | Admitting: Internal Medicine

## 2019-04-07 ENCOUNTER — Telehealth: Payer: Self-pay

## 2019-04-07 NOTE — Telephone Encounter (Signed)
Left a message for the patient to contact our office for a follow up with Dr. Saunders Revel.

## 2019-04-07 NOTE — Telephone Encounter (Signed)
Left a message to call back for a follow up appointment with Dr. Saunders Revel.

## 2019-05-14 LAB — CBC AND DIFFERENTIAL
HCT: 39 (ref 36–46)
Hemoglobin: 12.7 (ref 12.0–16.0)
Platelets: 309 (ref 150–399)
WBC: 6.4

## 2019-05-14 LAB — BASIC METABOLIC PANEL
BUN: 16 (ref 4–21)
Creatinine: 0.7 (ref 0.5–1.1)
Glucose: 152
Potassium: 3.8 (ref 3.4–5.3)
Sodium: 10 — AB (ref 137–147)

## 2019-05-27 ENCOUNTER — Encounter: Payer: Self-pay | Admitting: Family Medicine

## 2019-05-27 NOTE — Progress Notes (Signed)
[facility]/thx dmf

## 2019-06-28 ENCOUNTER — Other Ambulatory Visit: Payer: Self-pay | Admitting: Internal Medicine

## 2019-07-22 ENCOUNTER — Other Ambulatory Visit: Payer: Self-pay | Admitting: Internal Medicine

## 2019-07-24 ENCOUNTER — Ambulatory Visit (INDEPENDENT_AMBULATORY_CARE_PROVIDER_SITE_OTHER): Payer: BLUE CROSS/BLUE SHIELD | Admitting: Internal Medicine

## 2019-07-24 ENCOUNTER — Other Ambulatory Visit: Payer: Self-pay

## 2019-07-24 ENCOUNTER — Encounter: Payer: Self-pay | Admitting: Internal Medicine

## 2019-07-24 VITALS — BP 120/80 | HR 99 | Ht 63.0 in | Wt 188.8 lb

## 2019-07-24 DIAGNOSIS — E785 Hyperlipidemia, unspecified: Secondary | ICD-10-CM

## 2019-07-24 DIAGNOSIS — I2510 Atherosclerotic heart disease of native coronary artery without angina pectoris: Secondary | ICD-10-CM

## 2019-07-24 NOTE — Progress Notes (Signed)
Follow-up Outpatient Visit Date: 07/24/2019  Primary Care Provider: Lucille Passy, MD [place] [id number]  Chief Complaint: Follow-up coronary artery disease  HPI:  [name] is a 58 y.o. year-old female with history of coronary artery disease status post anterior STEMI (11/2016), hyperlipidemia, and anxiety, who presents for follow-up of CAD.  I last saw her in 09/2018, at which time she was doing well other than sporadic palpitations that occurred while her mother was hospitalized.  She reported that her anxiety and palpitations were under better control after switching from bupropion to Prestiq.  We did not make any medication changes at that time or pursue further testing.  Today, Ms, Cassandra Cox reports feeling well, denying chest pain, jaw pain (anginal equivalent), shortness of breath, palpitations, lightheadedness, and edema.  She is tolerating her medications well.  Her only complaint is of occasional headaches and associated soreness along the back of her neck, particularly after having been sitting for several hours.  She is planning to establish with a new PCP through [facility] [place] due to her insurance.  --------------------------------------------------------------------------------------------------  Cardiovascular History & Procedures: Cardiovascular Problems:  Coronary artery disease status post anterior STEMI (11/2016)  Risk Factors:  Known CAD and hyperlipidemia  Cath/PCI:  LHC/PCI (12/02/16): LMCA normal. LAD 30% ostial and 99% mid vessel disease. LCx and RCA without significant disease. Successful IVUS-guided PCI to mid LAD with placement of integrity resolute 3.0 x 18 mm drug-eluting stent postdilated with 3.75 mm [place] balloon.  CV Surgery:  None  EP Procedures and Devices:  None  Non-Invasive Evaluation(s):  TTE (09/14/2017): Normal LV size.  LVEF 55-60% with normal wall motion.  Grade 2 diastolic dysfunction with elevated filling  pressure.  Normal RV size and function.  No significant valvular abnormality.  Pharmacologic MPI (09/14/2017): Normal study without ischemia or scar. LVEF 72%.  TTE (12/03/16): Normal LV size with mild LVH. LVEF 55-60% with grade 2 diastolic dysfunction. Left atrial enlargement. Normal RV size and function. No significant valvular abnormalities.  Recent CV Pertinent Labs: Lab Results  Component Value Date   CHOL 155 09/18/2018   HDL 74 09/18/2018   LDLCALC 64 09/18/2018   LDLDIRECT 142.2 12/04/2013   TRIG 85 09/18/2018   CHOLHDL 2.3 09/14/2017   INR 1.03 09/14/2017   K 3.8 05/14/2019   MG 1.7 12/04/2016   BUN 16 05/14/2019   CREATININE 0.7 05/14/2019   CREATININE 0.74 09/14/2017    Past medical and surgical history were reviewed and updated in EPIC.  Current Meds  Medication Sig  . ALPRAZolam (XANAX) 0.25 MG tablet Take 0.25 mg by mouth at bedtime as needed for anxiety.  Marland Kitchen aspirin EC 81 MG tablet Take 81 mg by mouth daily.  Marland Kitchen atorvastatin (LIPITOR) 80 MG tablet TAKE 1 TABLET BY MOUTH DAILY AT 6PM  . buPROPion (WELLBUTRIN XL) 150 MG 24 hr tablet TK THREE TABLETS PO ONCE D IN THE MORNING  . Cholecalciferol (VITAMIN D-3) 5000 UNITS TABS Take 1 tablet by mouth daily.   . clopidogrel (PLAVIX) 75 MG tablet TAKE 1 TABLET BY MOUTH DAILY GENERIC EQUIVALENT FOR PLAVIX  . Cyanocobalamin 1000 MCG/ML LIQD Inject 1 mL as directed once a week.  . ezetimibe (ZETIA) 10 MG tablet Take 1 tablet (10 mg total) by mouth daily.  Marland Kitchen levocetirizine (XYZAL) 5 MG tablet Take 1 tablet at bedtime by mouth.  . magnesium gluconate (MAGONATE) 500 MG tablet Take 1,000 mg by mouth at bedtime.   . metFORMIN (GLUCOPHAGE) 500  MG tablet Take 500 mg by mouth 2 (two) times daily with a meal.  . montelukast (SINGULAIR) 10 MG tablet Take 10 mg by mouth at bedtime.  Marland Kitchen NALTREXONE HCL PO Take 4 mg by mouth at bedtime.   . nitroGLYCERIN (NITROSTAT) 0.4 MG SL tablet Place 1 tablet (0.4 mg total) under the tongue every 5  (five) minutes as needed for chest pain. For maximum of 3 doses.  . progesterone (PROMETRIUM) 200 MG capsule Take 200 mg at bedtime by mouth.  . zolpidem (AMBIEN CR) 12.5 MG CR tablet Take 12.5 mg by mouth at bedtime as needed for sleep.    Allergies: Prednisone, Sulfa antibiotics, Sulfasalazine, and Sulfonamide derivatives  Social History   Tobacco Use  . Smoking status: Never Smoker  . Smokeless tobacco: Never Used  Substance Use Topics  . Alcohol use: No  . Drug use: No    Family History  Problem Relation Age of Onset  . Heart disease Mother   . Diabetes Mother   . Stroke Mother   . Heart attack Mother   . Atrial fibrillation Mother   . Hypertension Mother   . Hyperlipidemia Mother   . COPD Father   . Hypertension Father   . Hyperlipidemia Father   . Breast cancer Maternal Aunt 41       x2 age 71  . Diabetes Maternal Grandfather     Review of Systems: A 12-system review of systems was performed and was negative except as noted in the HPI.  --------------------------------------------------------------------------------------------------  Physical Exam: BP 120/80 (BP Location: Left Arm, Patient Position: Sitting, Cuff Size: Normal)   Pulse 99   Ht 5' 3"  (1.6 m)   Wt 188 lb 12 oz (85.6 kg)   SpO2 98%   BMI 33.44 kg/m   General:  NAD HEENT: No conjunctival pallor or scleral icterus. Facemask in place Neck: Supple without lymphadenopathy, thyromegaly, JVD, or HJR. Lungs: Normal work of breathing. Clear to auscultation bilaterally without wheezes or crackles. Heart: Regular rate and rhythm without murmurs, rubs, or gallops. Non-displaced PMI. Abd: Bowel sounds present. Soft, NT/ND without hepatosplenomegaly Ext: No lower extremity edema. Radial, PT, and DP pulses are 2+ bilaterally. Skin: Warm and dry without rash.  EKG:  NSR with inferolateral ST depressions, unchanged from prior tracings.  Lab Results  Component Value Date   WBC 6.4 05/14/2019   HGB 12.7  05/14/2019   HCT 39 05/14/2019   MCV 94.3 09/14/2017   PLT 309 05/14/2019    Lab Results  Component Value Date   NA 10 (A) 05/14/2019   K 3.8 05/14/2019   CL 104 09/14/2017   CO2 23 09/14/2017   BUN 16 05/14/2019   CREATININE 0.7 05/14/2019   GLUCOSE 91 09/14/2017   ALT 29 09/14/2017    Lab Results  Component Value Date   CHOL 155 09/18/2018   HDL 74 09/18/2018   LDLCALC 64 09/18/2018   LDLDIRECT 142.2 12/04/2013   TRIG 85 09/18/2018   CHOLHDL 2.3 09/14/2017    --------------------------------------------------------------------------------------------------  ASSESSMENT AND PLAN: Coronary artery disease: [name] continues to feel well without recurrent angina.  She has persistent inferolateral ST depressions on her EKG, which are similar to prior tracings.  Given lack of symptoms, we will defer further workup at this time.  We will continue indefinite DAPT with ASA and clopidogrel, as well as aggressive secondary prevention.  CBC should be checked when she establishes with her new PCP next month, given ongoing dual antiplatelet therapy.  Hyperlipidemia:  LDL at goal on most recent check in November.  Continue atorvastatin and ezetimibe.  She will be due for repeat lipid panel and LFT's in November; these can be checked when she establishes with her new PCP next month.  Follow-up: Return to clinic in 1 year.  Nelva Bush, MD 07/24/2019 8:15 PM

## 2019-07-24 NOTE — Patient Instructions (Signed)
Medication Instructions:  Your physician recommends that you continue on your current medications as directed. Please refer to the Current Medication list given to you today.  If you need a refill on your cardiac medications before your next appointment, please call your pharmacy.   Lab work: CBC, fasting lipid, CMP @ outside lab ([facility]) If you have labs (blood work) drawn today and your tests are completely normal, you will receive your results only by: Marland Kitchen MyChart Message (if you have MyChart) OR . A paper copy in the mail If you have any lab test that is abnormal or we need to change your treatment, we will call you to review the results.  Testing/Procedures: None ordered   Follow-Up: At [facility], you and your health needs are our priority.  As part of our continuing mission to provide you with exceptional heart care, we have created designated Provider Care Teams.  These Care Teams include your primary Cardiologist (physician) and Advanced Practice Providers (APPs -  Physician Assistants and Nurse Practitioners) who all work together to provide you with the care you need, when you need it. You will need a follow up appointment in 12 months.  Please call our office 2 months in advance to schedule this appointment.  You may see Dr. Saunders Revel or one of the following Advanced Practice Providers on your designated Care Team:   Murray Hodgkins, NP Christell Faith, PA-C . Marrianne Mood, PA-C

## 2019-08-13 ENCOUNTER — Other Ambulatory Visit: Payer: Self-pay | Admitting: Internal Medicine

## 2019-08-23 ENCOUNTER — Other Ambulatory Visit: Payer: Self-pay | Admitting: Internal Medicine

## 2019-08-29 ENCOUNTER — Other Ambulatory Visit: Payer: Self-pay | Admitting: Internal Medicine

## 2019-09-02 ENCOUNTER — Other Ambulatory Visit: Payer: Self-pay

## 2019-09-02 DIAGNOSIS — E785 Hyperlipidemia, unspecified: Secondary | ICD-10-CM

## 2020-02-18 ENCOUNTER — Ambulatory Visit (INDEPENDENT_AMBULATORY_CARE_PROVIDER_SITE_OTHER): Payer: 59 | Admitting: Internal Medicine

## 2020-02-18 ENCOUNTER — Encounter: Payer: Self-pay | Admitting: Internal Medicine

## 2020-02-18 ENCOUNTER — Other Ambulatory Visit: Payer: Self-pay

## 2020-02-18 VITALS — BP 130/92 | HR 93 | Ht 63.0 in | Wt 186.1 lb

## 2020-02-18 DIAGNOSIS — I2510 Atherosclerotic heart disease of native coronary artery without angina pectoris: Secondary | ICD-10-CM

## 2020-02-18 DIAGNOSIS — R0602 Shortness of breath: Secondary | ICD-10-CM | POA: Diagnosis not present

## 2020-02-18 DIAGNOSIS — E785 Hyperlipidemia, unspecified: Secondary | ICD-10-CM | POA: Diagnosis not present

## 2020-02-18 DIAGNOSIS — R030 Elevated blood-pressure reading, without diagnosis of hypertension: Secondary | ICD-10-CM

## 2020-02-18 MED ORDER — NITROGLYCERIN 0.4 MG SL SUBL: 0.4000 mg | SUBLINGUAL_TABLET | SUBLINGUAL | 3 refills | Status: DC | PRN

## 2020-02-18 NOTE — Progress Notes (Signed)
Follow-up Outpatient Visit Date: 02/18/2020  Primary Care Provider: Jani Gravel, PA [facility] [address] [place], [place] [address]  Chief Complaint: Shortness of breath  HPI:  Cassandra Cox is a 59 y.o. female with history of coronary artery disease status post anterior STEMI (11/2016), hyperlipidemia, and anxiety, who presents for follow-up of coronary artery disease.  I last saw her in 07/2019, at which time Cassandra Cox was doing well.  No medication changes or additional testing were pursued at that time.  Today, Cassandra Cox reports that she is feeling a little bit more dyspneic with activity.  At times, she feels like she is inadvertently holding her breath.  She attributes some of this to deconditioning and being less active over the last several months.  She is trying to increase her activity.  She denies chest pain, orthopnea, PND, and edema.  She has occasional brief palpitations.  She denies bleeding and remains compliant with her medications, including aspirin and clopidogrel.  She was started on a thyroid medication a few months ago but does not recall the exact name (likely levothyroxine).  She has been trying to limit her sodium intake.  --------------------------------------------------------------------------------------------------  Cardiovascular History & Procedures: Cardiovascular Problems:  Coronary artery disease status post anterior STEMI (11/2016)  Risk Factors:  Known CAD and hyperlipidemia  Cath/PCI:  LHC/PCI (12/02/16): LMCA normal. LAD 30% ostial and 99% mid vessel disease. LCx and RCA without significant disease. Successful IVUS-guided PCI to mid LAD with placement of Integrity Resolute 3.0 x 18 mm drug-eluting stent postdilated with 3.75 mm [place] balloon.  CV Surgery:  None  EP Procedures and Devices:  None  Non-Invasive Evaluation(s):  TTE (09/14/2017): Normal LV size.  LVEF 55-60% with normal wall motion.  Grade 2  diastolic dysfunction with elevated filling pressure.  Normal RV size and function.  No significant valvular abnormality.  Pharmacologic MPI (09/14/2017): Normal study without ischemia or scar. LVEF 72%.  TTE (12/03/16): Normal LV size with mild LVH. LVEF 55-60% with grade 2 diastolic dysfunction. Left atrial enlargement. Normal RV size and function. No significant valvular abnormalities.  Recent CV Pertinent Labs: Lab Results  Component Value Date   CHOL 155 09/18/2018   HDL 74 09/18/2018   LDLCALC 64 09/18/2018   LDLDIRECT 142.2 12/04/2013   TRIG 85 09/18/2018   CHOLHDL 2.3 09/14/2017   INR 1.03 09/14/2017   K 3.8 05/14/2019   MG 1.7 12/04/2016   BUN 16 05/14/2019   CREATININE 0.7 05/14/2019   CREATININE 0.74 09/14/2017    Past medical and surgical history were reviewed and updated in EPIC.  Current Meds  Medication Sig  . ALPRAZolam (XANAX) 0.25 MG tablet Take 0.25 mg by mouth at bedtime as needed for anxiety.  Marland Kitchen aspirin EC 81 MG tablet Take 81 mg by mouth daily.  Marland Kitchen atorvastatin (LIPITOR) 80 MG tablet TAKE 1 TABLET BY MOUTH DAILY AT 6PM GENERIC EQUIVALENT FOR LIPITOR  . buPROPion (WELLBUTRIN XL) 150 MG 24 hr tablet Take 300 mg by mouth daily.   . Cholecalciferol (VITAMIN D-3) 5000 UNITS TABS Take 1 tablet by mouth daily.   . clopidogrel (PLAVIX) 75 MG tablet TAKE 1 TABLET BY MOUTH DAILY GENERIC EQUIVALENT FOR PLAVIX  . Cyanocobalamin 1000 MCG/ML LIQD Inject 1 mL as directed once a week.  . ezetimibe (ZETIA) 10 MG tablet TAKE 1 TABLET BY MOUTH DAILY.  Marland Kitchen FLUoxetine (PROZAC) 20 MG capsule daily.  Marland Kitchen levocetirizine (XYZAL) 5 MG tablet Take 1 tablet at  bedtime by mouth.  . levothyroxine (SYNTHROID) 50 MCG tablet Take 10 mcg by mouth daily before breakfast. Takes 10 mcg by mouth daily. By Lanell Persons, PA.  . magnesium gluconate (MAGONATE) 500 MG tablet Take 1,000 mg by mouth at bedtime.   . metFORMIN (GLUCOPHAGE) 500 MG tablet Take 500 mg by mouth 2 (two) times daily with a  meal.  . montelukast (SINGULAIR) 10 MG tablet Take 10 mg by mouth at bedtime.  Marland Kitchen NALTREXONE HCL PO Take 4 mg by mouth at bedtime.   . nitroGLYCERIN (NITROSTAT) 0.4 MG SL tablet Place 1 tablet (0.4 mg total) under the tongue every 5 (five) minutes as needed for chest pain. For maximum of 3 doses.  . progesterone (PROMETRIUM) 200 MG capsule Take 200 mg at bedtime by mouth.  . zolpidem (AMBIEN CR) 12.5 MG CR tablet Take 12.5 mg by mouth at bedtime as needed for sleep.  . [DISCONTINUED] nitroGLYCERIN (NITROSTAT) 0.4 MG SL tablet Place 1 tablet (0.4 mg total) under the tongue every 5 (five) minutes as needed for chest pain. For maximum of 3 doses.    Allergies: Prednisone, Sulfa antibiotics, Sulfasalazine, and Sulfonamide derivatives  Social History   Tobacco Use  . Smoking status: Never Smoker  . Smokeless tobacco: Never Used  Substance Use Topics  . Alcohol use: No  . Drug use: No    Family History  Problem Relation Age of Onset  . Heart disease Mother   . Diabetes Mother   . Stroke Mother   . Heart attack Mother   . Atrial fibrillation Mother   . Hypertension Mother   . Hyperlipidemia Mother   . COPD Father   . Hypertension Father   . Hyperlipidemia Father   . Breast cancer Maternal Aunt 23       x2 age 66  . Diabetes Maternal Grandfather     Review of Systems: A 12-system review of systems was performed and was negative except as noted in the HPI.  --------------------------------------------------------------------------------------------------  Physical Exam: BP (!) 130/92 (BP Location: Left Arm, Patient Position: Sitting, Cuff Size: Normal)   Pulse 93   Ht 5' 3"  (1.6 m)   Wt 186 lb 2 oz (84.4 kg)   SpO2 98%   BMI 32.97 kg/m   General: NAD. Neck: No JVD or HJR. Lungs: Clear to auscultation bilaterally without wheezes or crackles. Heart: Regular rate and rhythm without murmurs, rubs, or gallops. Abdomen: Soft, nontender, and nondistended. Extremities: No lower  extremity edema.  EKG: Normal sinus rhythm with nonspecific ST/T changes.  No significant change from prior tracing on 07/24/2019.  Lab Results  Component Value Date   WBC 6.4 05/14/2019   HGB 12.7 05/14/2019   HCT 39 05/14/2019   MCV 94.3 09/14/2017   PLT 309 05/14/2019    Lab Results  Component Value Date   NA 10 (A) 05/14/2019   K 3.8 05/14/2019   CL 104 09/14/2017   CO2 23 09/14/2017   BUN 16 05/14/2019   CREATININE 0.7 05/14/2019   GLUCOSE 91 09/14/2017   ALT 29 09/14/2017    Lab Results  Component Value Date   CHOL 155 09/18/2018   HDL 74 09/18/2018   LDLCALC 64 09/18/2018   LDLDIRECT 142.2 12/04/2013   TRIG 85 09/18/2018   CHOLHDL 2.3 09/14/2017    --------------------------------------------------------------------------------------------------  ASSESSMENT AND PLAN: Coronary artery disease and shortness of breath: Cassandra Cox has not had any further chest pain but notes mild exertional dyspnea over the last few months.  The shortness of breath is nonspecific and most likely due to deconditioning.  We discussed empiric trial of antianginal therapy that would also help control heart rate and blood pressure.  Unfortunately, Cassandra Cox has been intolerant of several beta-blockers in the past.  She wishes to defer pharmacotherapy at this time.  We will continue with secondary prevention with current regimen of aspirin, clopidogrel, and atorvastatin.  We will plan to check a CBC, CMP, and lipid panel through [facility] at Cassandra Cox' convenience.  Hyperlipidemia: Continue atorvastatin 80 mg daily with plans for CMP and fasting lipid panel at the patient's convenience.  Elevated blood pressure: Blood pressure mildly elevated today.  I have encouraged Cassandra Cox to continue to minimize her sodium intake and to increase her activity as tolerated.  We will defer medication changes at this time.  Follow-up: Return to clinic in 3 months.  Nelva Bush, MD 02/19/2020 6:51  AM

## 2020-02-18 NOTE — Patient Instructions (Signed)
Medication Instructions:  Your physician recommends that you continue on your current medications as directed. Please refer to the Current Medication list given to you today.  *If you need a refill on your cardiac medications before your next appointment, please call your pharmacy*   Lab Work: Your physician recommends that you return for lab work in: at your earliest convenience.  - You will need to be fasting. Please do not have anything to eat or drink after midnight the morning you have the lab work. You may only have water or black coffee with no cream or sugar.  If you have labs (blood work) drawn today and your tests are completely normal, you will receive your results only by: Marland Kitchen MyChart Message (if you have MyChart) OR . A paper copy in the mail If you have any lab test that is abnormal or we need to change your treatment, we will call you to review the results.   Testing/Procedures: none   Follow-Up: At [facility], you and your health needs are our priority.  As part of our continuing mission to provide you with exceptional heart care, we have created designated Provider Care Teams.  These Care Teams include your primary Cardiologist (physician) and Advanced Practice Providers (APPs -  Physician Assistants and Nurse Practitioners) who all work together to provide you with the care you need, when you need it.  We recommend signing up for the patient portal called "MyChart".  Sign up information is provided on this After Visit Summary.  MyChart is used to connect with patients for Virtual Visits (Telemedicine).  Patients are able to view lab/test results, encounter notes, upcoming appointments, etc.  Non-urgent messages can be sent to your provider as well.   To learn more about what you can do with MyChart, go to [email or web address].    Your next appointment:   3 month(s)  The format for your next appointment:   In Person  Provider:    You may see DR Harrell Gave END  or one of the following Advanced Practice Providers on your designated Care Team:    Murray Hodgkins, NP  Christell Faith, PA-C  Marrianne Mood, PA-C

## 2020-02-19 ENCOUNTER — Encounter: Payer: Self-pay | Admitting: Internal Medicine

## 2020-02-19 DIAGNOSIS — R0602 Shortness of breath: Secondary | ICD-10-CM | POA: Insufficient documentation

## 2020-03-04 LAB — LIPID PANEL
Chol/HDL Ratio: 2.5 ratio (ref 0.0–4.4)
Cholesterol, Total: 146 mg/dL (ref 100–199)
HDL: 59 mg/dL (ref >39)
LDL Chol Calc (NIH): 67 mg/dL (ref 0–99)
Triglycerides: 112 mg/dL (ref 0–149)
VLDL Cholesterol Cal: 20 mg/dL (ref 5–40)

## 2020-03-04 LAB — CBC
Hematocrit: 37.7 % (ref 34.0–46.6)
Hemoglobin: 12.2 g/dL (ref 11.1–15.9)
MCH: 29.8 pg (ref 26.6–33.0)
MCHC: 32.4 g/dL (ref 31.5–35.7)
MCV: 92 fL (ref 79–97)
Platelets: 270 10*3/uL (ref 150–450)
RBC: 4.09 x10E6/uL (ref 3.77–5.28)
RDW: 12.9 % (ref 11.7–15.4)
WBC: 6.9 10*3/uL (ref 3.4–10.8)

## 2020-03-04 LAB — COMPREHENSIVE METABOLIC PANEL
ALT: 23 IU/L (ref 0–32)
AST: 21 IU/L (ref 0–40)
Albumin/Globulin Ratio: 2.1 (ref 1.2–2.2)
Albumin: 4.4 g/dL (ref 3.8–4.9)
Alkaline Phosphatase: 99 IU/L (ref 39–117)
BUN/Creatinine Ratio: 27 (ref 12–28)
BUN: 18 mg/dL (ref 8–27)
Bilirubin Total: 0.6 mg/dL (ref 0.0–1.2)
CO2: 21 mmol/L (ref 20–29)
Calcium: 9.1 mg/dL (ref 8.7–10.3)
Chloride: 104 mmol/L (ref 96–106)
Creatinine, Ser: 0.66 mg/dL (ref 0.57–1.00)
GFR calc Af Amer: 111 mL/min/{1.73_m2} (ref >59)
GFR calc non Af Amer: 96 mL/min/{1.73_m2} (ref >59)
Globulin, Total: 2.1 g/dL (ref 1.5–4.5)
Glucose: 94 mg/dL (ref 65–99)
Potassium: 4.1 mmol/L (ref 3.5–5.2)
Sodium: 140 mmol/L (ref 134–144)
Total Protein: 6.5 g/dL (ref 6.0–8.5)

## 2020-05-17 NOTE — Progress Notes (Signed)
Cardiology Office Note    Date:  05/21/2020   ID:  Cassandra Cox, DOB 1960/12/22, MRN [id number]  PCP:  System, Pcp Not In  Cardiologist:  Nelva Bush, MD  Electrophysiologist:  None   Chief Complaint: Follow up  History of Present Illness:   Cassandra Cox is a 59 y.o. female with history of CAD with anterior ST elevation MI in 11/2016 status post PCI/DES to the LAD, HLD, and anxiety who presents for follow-up of her CAD.  She was admitted to the hospital in 11/2016 with anterior ST elevation MI.  Emergent LHC showed ostial LAD 30% stenosis, mid LAD 99% stenosis, LCx and RCA without significant disease.  She underwent successful IVUS guided PCI to the mid LAD.  Echo at that time showed an EF of 55 to 24%, grade 2 diastolic dysfunction, left atrial enlargement, normal RV systolic function and ventricular cavity size, and no significant valvular abnormalities.  She was admitted to the hospital in 09/2017 with chest pain and ruled out.  Lexiscan MPI at that time was negative for significant ischemia or scar with an LVEF of 72%.  2D echo showed an EF of 55 to 60%, normal wall motion, grade 2 diastolic dysfunction with elevated filling pressure, normal RV systolic function and ventricular cavity size, and no significant valvular abnormalities.  She was last seen in the office in 02/2020 noting a little more dyspnea with activity and at times she was inadvertently holding her breath.  She attributed some of her symptoms to deconditioning and being less active over the preceding several months.  She also noted occasional brief palpitations.  In the setting of intolerance to several beta-blockers in the past, pharmacotherapy was deferred at that time.  She comes in doing reasonably well from a cardiac perspective.  She does note some exertional shortness of breath indicating she could ambulate approximately 50 yards prior to needing to stop and rest.  No chest pain, diaphoresis, nausea, vomiting,  presyncope, syncope.  There is some dizziness if she stands up quickly.  She does report a longstanding history of palpitations which seem to be occurring more frequently lately sometimes daily and sometimes several times per week.  These last for several seconds and spontaneously resolved.  She does occasionally note an involuntary cough/tickle associated with these.  No associated dizziness, chest pain, or dyspnea with these palpitations.  She is tolerating all medications including dual antiplatelet therapy without issues.  No falls, hematochezia, or melena.   Labs independently reviewed: 02/2020 - BUN 18, serum creatinine 0.66, potassium 4.1, albumin 4.4, AST/ALT normal, TC 146, TG 112, HDL 59, LDL 67, Hgb 12.2, PLT 270   Past Medical History:  Diagnosis Date  . Coronary artery disease    a. 11/2016 Cath/PCI: LM nl, LAD 30ost, 25m(3.0x18 Resolute Integrity DES--3.75), LCX nl, RCA nl; b. MV 11/18: negative for ischemia, EF 72%, low risk  . Depression   . Diastolic dysfunction    a. 11/2016 Echo: EF 55-60%, Gr2 DD, LAE, nl RV size/fxn.  . Hyperlipemia \\[id number]\ . Kidney stone 08/2016  . MVP (mitral valve prolapse)   . STEMI (ST elevation myocardial infarction) ([employer] 12/02/2016  . Vitamin B 12 deficiency     Past Surgical History:  Procedure Laterality Date  . ABDOMINAL HYSTERECTOMY  2003   TAH due to adenomyosis  . BREAST REDUCTION SURGERY  1990  . CARDIAC CATHETERIZATION N/A 12/02/2016   Procedure: Coronary Stent Intervention;  Surgeon: CNelva Bush MD;  [facility]  INVAS[facility] LAB;  Service: Cardiovascular;  Laterality: N/A;  . CARDIAC CATHETERIZATION N/A 12/02/2016   Procedure: Left Heart Cath and Coronary Angiography;  Surgeon: Nelva Bush, MD;  Location: [facility] CV LAB;  Service: Cardiovascular;  Laterality: N/A;  . CARDIAC CATHETERIZATION N/A 12/02/2016   Procedure: Intravascular Ultrasound/IVUS;  Surgeon: Nelva Bush, MD;  Location: [place] CV LAB;   Service: Cardiovascular;  Laterality: N/A;  . CORONARY STENT INTERVENTION    . MOUTH SURGERY     Dental implant    Current Medications: Current Meds  Medication Sig  . ALPRAZolam (XANAX) 0.25 MG tablet Take 0.25 mg by mouth at bedtime as needed for anxiety.  Marland Kitchen amoxicillin (AMOXIL) 500 MG capsule Take 500 mg by mouth 3 (three) times daily.   Francia Greaves THYROID 30 MG tablet Take 30 mg by mouth daily.  Marland Kitchen aspirin EC 81 MG tablet Take 81 mg by mouth daily.  Marland Kitchen atorvastatin (LIPITOR) 80 MG tablet TAKE 1 TABLET BY MOUTH DAILY AT 6PM GENERIC EQUIVALENT FOR LIPITOR  . buPROPion (WELLBUTRIN XL) 150 MG 24 hr tablet Take 300 mg by mouth daily.   . Cholecalciferol (VITAMIN D-3) 5000 UNITS TABS Take 1 tablet by mouth daily.   . clopidogrel (PLAVIX) 75 MG tablet TAKE 1 TABLET BY MOUTH DAILY GENERIC EQUIVALENT FOR PLAVIX  . Cyanocobalamin 1000 MCG/ML LIQD Inject 1 mL as directed once a week.  . ezetimibe (ZETIA) 10 MG tablet TAKE 1 TABLET BY MOUTH DAILY.  Marland Kitchen FLUoxetine (PROZAC) 20 MG capsule Take 20 mg by mouth daily.   Marland Kitchen levocetirizine (XYZAL) 5 MG tablet Take 1 tablet at bedtime by mouth.  . liothyronine (CYTOMEL) 5 MCG tablet Take 10 mcg by mouth every morning.  . magnesium gluconate (MAGONATE) 500 MG tablet Take 1,000 mg by mouth at bedtime.   . metFORMIN (GLUCOPHAGE) 500 MG tablet Take 500 mg by mouth 2 (two) times daily with a meal.  . montelukast (SINGULAIR) 10 MG tablet Take 10 mg by mouth at bedtime.  Marland Kitchen NALTREXONE HCL PO Take 4 mg by mouth at bedtime.   . nitroGLYCERIN (NITROSTAT) 0.4 MG SL tablet Place 1 tablet (0.4 mg total) under the tongue every 5 (five) minutes as needed for chest pain. For maximum of 3 doses.  . progesterone (PROMETRIUM) 200 MG capsule Take 200 mg at bedtime by mouth.  . zolpidem (AMBIEN CR) 12.5 MG CR tablet Take 12.5 mg by mouth at bedtime as needed for sleep.    Allergies:   Prednisone, Sulfa antibiotics, Sulfasalazine, and Sulfonamide derivatives   Social History    Socioeconomic History  . Marital status: Divorced    Spouse name: Not on file  . Number of children: Not on file  . Years of education: Not on file  . Highest education level: Not on file  Occupational History    Employer: [employer]  Tobacco Use  . Smoking status: Never Smoker  . Smokeless tobacco: Never Used  Vaping Use  . Vaping Use: Never used  Substance and Sexual Activity  . Alcohol use: No  . Drug use: No  . Sexual activity: Yes    Birth control/protection: Surgical  Other Topics Concern  . Not on file  Social History Narrative  . Not on file   Social Determinants of Health   Financial Resource Strain:   . Difficulty of Paying Living Expenses:   Food Insecurity:   . Worried About Charity fundraiser in the Last Year:   . Ran  Out of Food in the Last Year:   Transportation Needs:   . Lack of Transportation (Medical):   Marland Kitchen Lack of Transportation (Non-Medical):   Physical Activity:   . Days of Exercise per Week:   . Minutes of Exercise per Session:   Stress:   . Feeling of Stress :   Social Connections:   . Frequency of Communication with Friends and Family:   . Frequency of Social Gatherings with Friends and Family:   . Attends Religious Services:   . Active Member of Clubs or Organizations:   . Attends Archivist Meetings:   Marland Kitchen Marital Status:      Family History:  The patient's family history includes Atrial fibrillation in her mother; Breast cancer (age of onset: 63) in her maternal aunt; COPD in her father; Diabetes in her maternal grandfather and mother; Heart attack in her mother; Heart disease in her mother; Hyperlipidemia in her father and mother; Hypertension in her father and mother; Stroke in her mother.  ROS:   Review of Systems  Constitutional: Positive for malaise/fatigue. Negative for chills, diaphoresis, fever and weight loss.  HENT: Negative for congestion.   Eyes: Negative for discharge and redness.  Respiratory:  Positive for shortness of breath. Negative for cough, sputum production and wheezing.   Cardiovascular: Positive for palpitations. Negative for chest pain, orthopnea, claudication, leg swelling and PND.  Gastrointestinal: Negative for abdominal pain, blood in stool, heartburn, melena, nausea and vomiting.  Musculoskeletal: Negative for falls and myalgias.  Skin: Negative for rash.  Neurological: Negative for dizziness, tingling, tremors, sensory change, speech change, focal weakness, loss of consciousness and weakness.  Endo/Heme/Allergies: Does not bruise/bleed easily.  Psychiatric/Behavioral: Negative for substance abuse. The patient is not nervous/anxious.   All other systems reviewed and are negative.    EKGs/Labs/Other Studies Reviewed:    Studies reviewed were summarized above. The additional studies were reviewed today:  Lexiscan MPI 09/2017: Pharmacological myocardial perfusion imaging study with no significant  ischemia Normal wall motion, EF estimated at 72% No EKG changes concerning for ischemia at peak stress or in recovery. Nonspecific ST abnormality at rest Low risk scan __________  2D echo 09/2017: - Left ventricle: The cavity size was normal. Systolic function was  normal. The estimated ejection fraction was in the range of 55%  to 60%. Wall motion was normal; there were no regional wall  motion abnormalities. Features are consistent with a pseudonormal  left ventricular filling pattern, with concomitant abnormal  relaxation and increased filling pressure (grade 2 diastolic  dysfunction).  - Left atrium: The atrium was normal in size.  - Right ventricle: Systolic function was normal.  - Pulmonary arteries: Systolic pressure was within the normal  range. __________  2D echo 11/2016: - Left ventricle: There was mild concentric hypertrophy. Systolic  function was normal. The estimated ejection fraction was in the  range of 55% to 60%. Features  are consistent with a pseudonormal  left ventricular filling pattern, with concomitant abnormal  relaxation and increased filling pressure (grade 2 diastolic  dysfunction). Doppler parameters are consistent with  indeterminate ventricular filling pressure.  - Aortic valve: Transvalvular velocity was within the normal range.  There was no stenosis. There was no regurgitation. Valve area  (Vmax): 1.85 cm^2.  - Mitral valve: Transvalvular velocity was within the normal range.  There was no evidence for stenosis. There was no regurgitation.  - Left atrium: The atrium was severely dilated.  - Right ventricle: The cavity size was normal. Wall  thickness was  normal. Systolic function was normal.  - Tricuspid valve: There was no regurgitation.  - Pulmonary arteries: Systolic pressure was within the normal  range. PA peak pressure: 26 mm Hg (S). __________  [facility] 11/2016: Conclusions: 1. Anterior STEMI with thrombotic occlusion of the mid LAD, likely due to acute plaque rupture. 2. Moderate diffuse atherosclerotic plaquing of the proximal and mid LAD by IVUS. 3. Mildly elevated left ventricular filling pressure. 4. Successful IVUS-guided PCI to the mid LAD with placement of an Integrity Resolute 3.0 x 18 mm drug-eluting stent, post-dilated in the proximal and mid portions with 3.75 mm noncompliant balloon.  Recommendations: 1. Admit to ICU on hospitalist service. 2. TR band protocol. 3. Continue tirofiban x 4 hours. 4. Dual antiplatelet therapy with aspirin and ticagrelor for at least 12 months. 5. Aggressive secondary prevention, including high-intensity statin therapy. 6. Echocardiogram to evaluate LV function.   EKG:  EKG is ordered today.  The EKG ordered today demonstrates NSR, 95 bpm, nonspecific inferolateral ST-T changes  Recent Labs: 03/03/2020: ALT 23; BUN 18; Creatinine, Ser 0.66; Hemoglobin 12.2; Platelets 270; Potassium 4.1; Sodium 140  Recent Lipid Panel      Component Value Date/Time   CHOL 146 03/03/2020 1052   TRIG 112 03/03/2020 1052   HDL 59 03/03/2020 1052   CHOLHDL 2.5 03/03/2020 1052   CHOLHDL 2.3 09/14/2017 0536   VLDL 9 09/14/2017 0536   LDLCALC 67 03/03/2020 1052   LDLDIRECT 142.2 12/04/2013 1516    PHYSICAL EXAM:    VS:  BP 120/68 (BP Location: Left Arm, Patient Position: Sitting, Cuff Size: Normal)   Pulse 95   Ht 5' 3"  (1.6 m)   Wt 189 lb 6 oz (85.9 kg)   SpO2 98%   BMI 33.55 kg/m   BMI: Body mass index is 33.55 kg/m.  Physical Exam Constitutional:      Appearance: She is well-developed.  HENT:     Head: Normocephalic and atraumatic.  Eyes:     General:        Right eye: No discharge.        Left eye: No discharge.  Neck:     Vascular: No JVD.  Cardiovascular:     Rate and Rhythm: Normal rate and regular rhythm.     Pulses: No midsystolic click and no opening snap.          Posterior tibial pulses are 2+ on the right side and 2+ on the left side.     Heart sounds: Normal heart sounds, S1 normal and S2 normal. Heart sounds not distant. No murmur heard.  No friction rub.  Pulmonary:     Effort: Pulmonary effort is normal. No respiratory distress.     Breath sounds: Normal breath sounds. No decreased breath sounds, wheezing or rales.  Chest:     Chest wall: No tenderness.  Abdominal:     General: There is no distension.     Palpations: Abdomen is soft.     Tenderness: There is no abdominal tenderness.  Musculoskeletal:     Cervical back: Normal range of motion.  Skin:    General: Skin is warm and dry.     Nails: There is no clubbing.  Neurological:     Mental Status: She is alert and oriented to person, place, and time.  Psychiatric:        Speech: Speech normal.        Behavior: Behavior normal.        Thought Content:  Thought content normal.        Judgment: Judgment normal.     Wt Readings from Last 3 Encounters:  05/21/20 189 lb 6 oz (85.9 kg)  02/18/20 186 lb 2 oz (84.4 kg)  07/24/19 188  lb 12 oz (85.6 kg)     ASSESSMENT & PLAN:   1. CAD involving the native coronary arteries with exertional dyspnea without angina: No symptoms of chest pain or symptoms consistent with her presentation prior to her MI.  She does note continued exertional dyspnea which is largely unchanged when compared to her last visit.  Schedule echo.  Continue with indefinite DAPT with ASA and clopidogrel as well as atorvastatin, Zetia, and aggressive secondary prevention.  Further recommendations pending Zio patch and echo results.  2. HLD: LDL 67 from 02/2020 with normal LFT at that time.  Continue atorvastatin and ezetimibe.  3. Palpitations: Longstanding issue though symptoms appear to be increasing in frequency.  Place Zio patch.  Attempt to avoid beta-blockers secondary to fatigue intolerance.  Disposition: F/u with Dr. Saunders Revel or an APP in 2 months.   Medication Adjustments/Labs and Tests Ordered: Current medicines are reviewed at length with the patient today.  Concerns regarding medicines are outlined above. Medication changes, Labs and Tests ordered today are summarized above and listed in the Patient Instructions accessible in Encounters.   Signed, Christell Faith, PA-C 05/21/2020 4:04 PM     [address] [place], [place] [address] [phone]

## 2020-05-21 ENCOUNTER — Other Ambulatory Visit: Payer: Self-pay

## 2020-05-21 ENCOUNTER — Encounter: Payer: Self-pay | Admitting: Physician Assistant

## 2020-05-21 ENCOUNTER — Ambulatory Visit (INDEPENDENT_AMBULATORY_CARE_PROVIDER_SITE_OTHER): Payer: 59

## 2020-05-21 ENCOUNTER — Ambulatory Visit (INDEPENDENT_AMBULATORY_CARE_PROVIDER_SITE_OTHER): Payer: 59 | Admitting: Physician Assistant

## 2020-05-21 VITALS — BP 120/68 | HR 95 | Ht 63.0 in | Wt 189.4 lb

## 2020-05-21 DIAGNOSIS — R002 Palpitations: Secondary | ICD-10-CM | POA: Diagnosis not present

## 2020-05-21 DIAGNOSIS — I2510 Atherosclerotic heart disease of native coronary artery without angina pectoris: Secondary | ICD-10-CM | POA: Diagnosis not present

## 2020-05-21 DIAGNOSIS — R0600 Dyspnea, unspecified: Secondary | ICD-10-CM

## 2020-05-21 DIAGNOSIS — E785 Hyperlipidemia, unspecified: Secondary | ICD-10-CM | POA: Diagnosis not present

## 2020-05-21 DIAGNOSIS — R0609 Other forms of dyspnea: Secondary | ICD-10-CM

## 2020-05-21 NOTE — Patient Instructions (Signed)
Medication Instructions:   Your physician recommends that you continue on your current medications as directed. Please refer to the Current Medication list given to you today.  *If you need a refill on your cardiac medications before your next appointment, please call your pharmacy*   Lab Work: None Ordered. If you have labs (blood work) drawn today and your tests are completely normal, you will receive your results only by: Marland Kitchen MyChart Message (if you have MyChart) OR . A paper copy in the mail If you have any lab test that is abnormal or we need to change your treatment, we will call you to review the results.   Testing/Procedures:  1.  Your physician has requested that you have an echocardiogram. Echocardiography is a painless test that uses sound waves to create images of your heart. It provides your doctor with information about the size and shape of your heart and how well your heart's chambers and valves are working. This procedure takes approximately one hour. There are no restrictions for this procedure.  2. Your physician has recommended that you wear a Zio monitor. This monitor is a medical device that records the heart's electrical activity. Doctors most often use these monitors to diagnose arrhythmias. Arrhythmias are problems with the speed or rhythm of the heartbeat. The monitor is a small device applied to your chest. You can wear one while you do your normal daily activities. While wearing this monitor if you have any symptoms to push the button and record what you felt. Once you have worn this monitor for the period of time provider prescribed (Usually 14 days), you will return the monitor device in the postage paid box. Once it is returned they will download the data collected and provide [place] with a report which the provider will then review and we will call you with those results. Important tips:  1. Avoid showering during the first 24 hours of wearing the monitor. 2. Avoid  excessive sweating to help maximize wear time. 3. Do not submerge the device, no hot tubs, and no swimming pools. 4. Keep any lotions or oils away from the patch. 5. After 24 hours you may shower with the patch on. Take brief showers with your back facing the shower head.  6. Do not remove patch once it has been placed because that will interrupt data and decrease adhesive wear time. 7. Push the button when you have any symptoms and write down what you were feeling. 8. Once you have completed wearing your monitor, remove and place into box which has postage paid and place in your outgoing mailbox.  9. If for some reason you have misplaced your box then call our office and we can provide another box and/or mail it off for you.           Follow-Up: At [facility], you and your health needs are our priority.  As part of our continuing mission to provide you with exceptional heart care, we have created designated Provider Care Teams.  These Care Teams include your primary Cardiologist (physician) and Advanced Practice Providers (APPs -  Physician Assistants and Nurse Practitioners) who all work together to provide you with the care you need, when you need it.  We recommend signing up for the patient portal called "MyChart".  Sign up information is provided on this After Visit Summary.  MyChart is used to connect with patients for Virtual Visits (Telemedicine).  Patients are able to view lab/test results, encounter notes, upcoming appointments,  etc.  Non-urgent messages can be sent to your provider as well.   To learn more about what you can do with MyChart, go to [email or web address].    Your next appointment:   2 month(s)  The format for your next appointment:   In Person  Provider:    You may see Nelva Bush, MD or one of the following Advanced Practice Providers on your designated Care Team:    Murray Hodgkins, NP  Christell Faith, PA-C  Marrianne Mood, PA-C  Laurann Montana, NP    Other Instructions   Echocardiogram An echocardiogram is a procedure that uses painless sound waves (ultrasound) to produce an image of the heart. Images from an echocardiogram can provide important information about:  Signs of coronary artery disease (CAD).  Aneurysm detection. An aneurysm is a weak or damaged part of an artery wall that bulges out from the normal force of blood pumping through the body.  Heart size and shape. Changes in the size or shape of the heart can be associated with certain conditions, including heart failure, aneurysm, and CAD.  Heart muscle function.  Heart valve function.  Signs of a past heart attack.  Fluid buildup around the heart.  Thickening of the heart muscle.  A tumor or infectious growth around the heart valves. Tell a health care provider about:  Any allergies you have.  All medicines you are taking, including vitamins, herbs, eye drops, creams, and over-the-counter medicines.  Any blood disorders you have.  Any surgeries you have had.  Any medical conditions you have.  Whether you are pregnant or may be pregnant. What are the risks? Generally, this is a safe procedure. However, problems may occur, including:  Allergic reaction to dye (contrast) that may be used during the procedure. What happens before the procedure? No specific preparation is needed. You may eat and drink normally. What happens during the procedure?   An IV tube may be inserted into one of your veins.  You may receive contrast through this tube. A contrast is an injection that improves the quality of the pictures from your heart.  A gel will be applied to your chest.  A wand-like tool (transducer) will be moved over your chest. The gel will help to transmit the sound waves from the transducer.  The sound waves will harmlessly bounce off of your heart to allow the heart images to be captured in real-time motion. The images will be recorded  on a computer. The procedure may vary among health care providers and hospitals. What happens after the procedure?  You may return to your normal, everyday life, including diet, activities, and medicines, unless your health care provider tells you not to do that. Summary  An echocardiogram is a procedure that uses painless sound waves (ultrasound) to produce an image of the heart.  Images from an echocardiogram can provide important information about the size and shape of your heart, heart muscle function, heart valve function, and fluid buildup around your heart.  You do not need to do anything to prepare before this procedure. You may eat and drink normally.  After the echocardiogram is completed, you may return to your normal, everyday life, unless your health care provider tells you not to do that. This information is not intended to replace advice given to you by your health care provider. Make sure you discuss any questions you have with your health care provider. Document Revised: 02/13/2019 Document Reviewed: 11/25/2016 Elsevier Patient Education  [place].

## 2020-06-11 ENCOUNTER — Other Ambulatory Visit: Payer: Self-pay

## 2020-06-11 MED ORDER — CLOPIDOGREL BISULFATE 75 MG PO TABS: ORAL_TABLET | ORAL | 0 refills | Status: DC

## 2020-06-16 ENCOUNTER — Other Ambulatory Visit: Payer: Self-pay | Admitting: Physician Assistant

## 2020-06-16 DIAGNOSIS — R0600 Dyspnea, unspecified: Secondary | ICD-10-CM

## 2020-06-21 ENCOUNTER — Telehealth: Payer: Self-pay

## 2020-06-21 NOTE — Telephone Encounter (Signed)
Call attempted. Line busy.

## 2020-06-21 NOTE — Telephone Encounter (Signed)
-----   Message from Rise Mu, [place] sent at 06/21/2020  1:31 PM EDT ----- Cardiac monitor showed sinus rhythm with a single 5-beat run coming from the top portion of the heart along with rare extra beats from the top and bottom portions of the heart. No significant arrhythmias or pauses were noted. Reassuring monitor. No medication changes at this time based on these findings. Follow up as planned next month.

## 2020-06-23 ENCOUNTER — Other Ambulatory Visit: Payer: Self-pay

## 2020-06-23 ENCOUNTER — Ambulatory Visit (INDEPENDENT_AMBULATORY_CARE_PROVIDER_SITE_OTHER): Payer: 59

## 2020-06-23 DIAGNOSIS — R0600 Dyspnea, unspecified: Secondary | ICD-10-CM | POA: Diagnosis not present

## 2020-06-23 MED ORDER — PERFLUTREN LIPID MICROSPHERE
1.0000 mL | INTRAVENOUS | Status: AC | PRN
  Administered 2020-06-23: 2 mL via INTRAVENOUS

## 2020-06-24 LAB — ECHOCARDIOGRAM COMPLETE
AR max vel: 2.63 cm2
AV Area VTI: 2.33 cm2
AV Area mean vel: 2.39 cm2
AV Mean grad: 4 mmHg
AV Peak grad: 7.1 mmHg
Ao pk vel: 1.33 m/s
Area-P 1/2: 4.41 cm2
S' Lateral: 3 cm

## 2020-06-25 ENCOUNTER — Telehealth: Payer: Self-pay

## 2020-06-25 NOTE — Telephone Encounter (Signed)
-----   Message from Rise Mu, PA-C sent at 06/24/2020  4:28 PM EDT ----- Echo showed normal pump function, normal wall motion, mildly stiffened heart, and a mildly leaky tricuspid valve.   Recommendations: -Optimal blood pressure control recommended, this was well controlled at her last visit -Sleep apnea can also play a role in stiffening of the heart, we can discuss this at her upcoming visit -Overall, echo is unchanged from prior studies outside a an incidentally noted mildly leaky tricuspid valve -Increase activity level -Follow up as planned next month to reassess symptoms

## 2020-06-25 NOTE — Telephone Encounter (Signed)
Attempted to call patient. LMTCB 06/25/2020

## 2020-06-28 NOTE — Telephone Encounter (Signed)
Call to patient to review echo results.    Pt verbalized understanding and has no further questions at this time.    Advised pt to call for any further questions or concerns.  No further orders.  Confirmed appt for next month.

## 2020-06-28 NOTE — Telephone Encounter (Signed)
Calling back for results, please advise

## 2020-07-19 NOTE — Progress Notes (Signed)
Cardiology Office Note    Date:  07/22/2020   ID:  Cassandra Cox, DOB 08/18/1961, MRN [id number]  PCP:  Pcp, No  Cardiologist:  Nelva Bush, MD  Electrophysiologist:  None   Chief Complaint: Follow up  History of Present Illness:   Cassandra Cox is a 59 y.o. female with history of CAD with anterior ST elevation MI in 11/2016 status post PCI/DES to the LAD, pSVT, HLD, and anxiety who presents for follow-up of her CAD.  She was admitted to the hospital in 11/2016 with anterior ST elevation MI.  Emergent LHC showed ostial LAD 30% stenosis, mid LAD 99% stenosis, LCx and RCA without significant disease.  She underwent successful IVUS guided PCI to the mid LAD.  Echo at that time showed an EF of 55 to 85%, grade 2 diastolic dysfunction, left atrial enlargement, normal RV systolic function and ventricular cavity size, and no significant valvular abnormalities.  She was admitted to the hospital in 09/2017 with chest pain and ruled out.  Lexiscan MPI at that time was negative for significant ischemia or scar with an LVEF of 72%.  2D echo showed an EF of 55 to 60%, normal wall motion, grade 2 diastolic dysfunction with elevated filling pressure, normal RV systolic function and ventricular cavity size, and no significant valvular abnormalities.  She was seen in the office in 02/2020 noting a little more dyspnea with activity and at times she was inadvertently holding her breath.  She attributed some of her symptoms to deconditioning and being less active over the preceding several months.  She also noted occasional brief palpitations.  In the setting of intolerance to several beta-blockers in the past, pharmacotherapy was deferred at that time.  She was last seen in the office in 05/2020 and was doing reasonably well, continuing to note some exertional dyspnea, which was largely unchanged when compared to her prior visit as well as some palpitations.  Subsequent echo showed an EF of 60-65%, no RWMA,  Gr2DD, normal RVSF and ventricular cavity size, and mild tricuspid regurgitation. Overall, this study was largely unchanged from prior. Zio patch showed a predominant rhythm of sinus with an average heart rate of 87 bpm (range 64-146 bpm in sinus), rare PACs/PVCs, single 5-beat atrial run with a maximum rate of 164 bpm.  There were no sustained arrhythmias or patient triggered events.   She comes in doing well from a cardiac perspective.  She does continue to note stable, chronic exertional shortness of breath.  She has not had any further palpitations.  No chest pain, diaphoresis, nausea, vomiting, dizziness, presyncope, or syncope.  She is tolerating all cardiac medications without issues.  No falls, hematochezia, or melena.  She is scheduled to undergo a needle core biopsy through [facility] with Dr. Luan Pulling on 08/09/2020.  Revised Cardiac Index: Low risk for noncardiac procedure Duke Activity Status Index: > 4 METs without cardiac limitation   Labs independently reviewed: 02/2020 - BUN 18, serum creatinine 0.66, potassium 4.1, albumin 4.4, AST/ALT normal, TC 146, TG 112, HDL 59, LDL 67, Hgb 12.2, PLT 270   Past Medical History:  Diagnosis Date  . Coronary artery disease    a. 11/2016 Cath/PCI: LM nl, LAD 30ost, 6m(3.0x18 Resolute Integrity DES--3.75), LCX nl, RCA nl; b. MV 11/18: negative for ischemia, EF 72%, low risk  . Depression   . Diastolic dysfunction    a. 11/2016 Echo: EF 55-60%, Gr2 DD, LAE, nl RV size/fxn.  . Hyperlipemia \\[id number]\ . Kidney  stone 08/2016  . MVP (mitral valve prolapse)   . STEMI (ST elevation myocardial infarction) ([place]) 12/02/2016  . Vitamin B 12 deficiency     Past Surgical History:  Procedure Laterality Date  . ABDOMINAL HYSTERECTOMY  2003   TAH due to adenomyosis  . BREAST REDUCTION SURGERY  1990  . CARDIAC CATHETERIZATION N/A 12/02/2016   Procedure: Coronary Stent Intervention;  Surgeon: Nelva Bush, MD;  Location: [place] [facility];   Service: Cardiovascular;  Laterality: N/A;  . CARDIAC CATHETERIZATION N/A 12/02/2016   Procedure: Left Heart Cath and Coronary Angiography;  Surgeon: Nelva Bush, MD;  Location: [place] [facility];  Service: Cardiovascular;  Laterality: N/A;  . CARDIAC CATHETERIZATION N/A 12/02/2016   Procedure: Intravascular Ultrasound/IVUS;  Surgeon: Nelva Bush, MD;  Location: [place] [facility];  Service: Cardiovascular;  Laterality: N/A;  . CORONARY STENT INTERVENTION    . MOUTH SURGERY     Dental implant    Current Medications: Current Meds  Medication Sig  . ALPRAZolam (XANAX) 0.25 MG tablet Take 0.25 mg by mouth at bedtime as needed for anxiety.  Francia Greaves THYROID 60 MG tablet Take 60 mg by mouth daily.  Marland Kitchen aspirin EC 81 MG tablet Take 81 mg by mouth daily.  Marland Kitchen atorvastatin (LIPITOR) 80 MG tablet TAKE 1 TABLET BY MOUTH DAILY AT 6PM GENERIC EQUIVALENT FOR LIPITOR  . buPROPion (WELLBUTRIN XL) 150 MG 24 hr tablet Take 300 mg by mouth daily.   . Cholecalciferol (VITAMIN D-3) 5000 UNITS TABS Take 1 tablet by mouth daily.   . clopidogrel (PLAVIX) 75 MG tablet TAKE 1 TABLET BY MOUTH DAILY GENERIC EQUIVALENT FOR PLAVIX  . Cyanocobalamin 1000 MCG/ML LIQD Inject 1 mL as directed once a week.  . ezetimibe (ZETIA) 10 MG tablet TAKE 1 TABLET BY MOUTH DAILY.  Marland Kitchen FLUoxetine (PROZAC) 20 MG capsule Take 20 mg by mouth daily.   Marland Kitchen levocetirizine (XYZAL) 5 MG tablet Take 1 tablet at bedtime by mouth.  . liothyronine (CYTOMEL) 5 MCG tablet Take 10 mcg by mouth every morning.  . magnesium gluconate (MAGONATE) 500 MG tablet Take 1,000 mg by mouth at bedtime.   . metFORMIN (GLUCOPHAGE) 500 MG tablet Take 500 mg by mouth 2 (two) times daily with a meal.  . metFORMIN (GLUCOPHAGE-XR) 500 MG 24 hr tablet Take 500 mg by mouth 2 (two) times daily.  . montelukast (SINGULAIR) 10 MG tablet Take 10 mg by mouth at bedtime.  Marland Kitchen NALTREXONE HCL PO Take 4 mg by mouth at bedtime.   . nitroGLYCERIN (NITROSTAT) 0.4 MG SL tablet  Place 1 tablet (0.4 mg total) under the tongue every 5 (five) minutes as needed for chest pain. For maximum of 3 doses.  . progesterone (PROMETRIUM) 200 MG capsule Take 200 mg at bedtime by mouth.  . progesterone (PROMETRIUM) 200 MG capsule Take 200 mg by mouth at bedtime.  Marland Kitchen zolpidem (AMBIEN CR) 12.5 MG CR tablet Take 12.5 mg by mouth at bedtime as needed for sleep.    Allergies:   Prednisone, Sulfa antibiotics, Sulfasalazine, and Sulfonamide derivatives   Social History   Socioeconomic History  . Marital status: Divorced    Spouse name: Not on file  . Number of children: Not on file  . Years of education: Not on file  . Highest education level: Not on file  Occupational History    Employer: [employer]  Tobacco Use  . Smoking status: Never Smoker  . Smokeless tobacco: Never Used  Vaping Use  .  Vaping Use: Never used  Substance and Sexual Activity  . Alcohol use: No  . Drug use: No  . Sexual activity: Yes    Birth control/protection: Surgical  Other Topics Concern  . Not on file  Social History Narrative  . Not on file   Social Determinants of Health   Financial Resource Strain:   . Difficulty of Paying Living Expenses: Not on file  Food Insecurity:   . Worried About Charity fundraiser in the Last Year: Not on file  . Ran Out of Food in the Last Year: Not on file  Transportation Needs:   . Lack of Transportation (Medical): Not on file  . Lack of Transportation (Non-Medical): Not on file  Physical Activity:   . Days of Exercise per Week: Not on file  . Minutes of Exercise per Session: Not on file  Stress:   . Feeling of Stress : Not on file  Social Connections:   . Frequency of Communication with Friends and Family: Not on file  . Frequency of Social Gatherings with Friends and Family: Not on file  . Attends Religious Services: Not on file  . Active Member of Clubs or Organizations: Not on file  . Attends Archivist Meetings: Not on file    . Marital Status: Not on file     Family History:  The patient's family history includes Atrial fibrillation in her mother; Breast cancer (age of onset: 66) in her maternal aunt; COPD in her father; Diabetes in her maternal grandfather and mother; Heart attack in her mother; Heart disease in her mother; Hyperlipidemia in her father and mother; Hypertension in her father and mother; Stroke in her mother.  ROS:   Review of Systems  Constitutional: Negative for chills, diaphoresis, fever, malaise/fatigue and weight loss.  HENT: Negative for congestion.   Eyes: Negative for discharge and redness.  Respiratory: Positive for shortness of breath. Negative for cough, sputum production and wheezing.   Cardiovascular: Negative for chest pain, palpitations, orthopnea, claudication, leg swelling and PND.  Gastrointestinal: Negative for abdominal pain, blood in stool, heartburn, melena, nausea and vomiting.  Musculoskeletal: Negative for falls and myalgias.  Skin: Negative for rash.  Neurological: Negative for dizziness, tingling, tremors, sensory change, speech change, focal weakness, loss of consciousness and weakness.  Endo/Heme/Allergies: Does not bruise/bleed easily.  Psychiatric/Behavioral: Negative for substance abuse. The patient is not nervous/anxious.   All other systems reviewed and are negative.    EKGs/Labs/Other Studies Reviewed:    Studies reviewed were summarized above. The additional studies were reviewed today:  2D echo 06/2020: 1. Left ventricular ejection fraction, by estimation, is 60 to 65%. The  left ventricle has normal function. The left ventricle has no regional  wall motion abnormalities. Left ventricular diastolic parameters are  consistent with Grade II diastolic  dysfunction (pseudonormalization).  2. Right ventricular systolic function is normal. The right ventricular  size is normal. Tricuspid regurgitation signal is inadequate for assessing  PA pressure.   __________  Elwyn Reach 05/2020:  The patient was monitored for 8 days, 5 hours.  The predominant rhythm was sinus with an average rate of 87 bpm (range 64-146 bpm in sinus).  There were rare PAC's and PVC's. A single 5 beat atrial run occurred with a maximum rate of 164 bpm.  No sustained arrhythmia or prolonged pause was observerd.  There were no patient triggered events.   Predominantly sinus rhythm with rare PAC's and PVC's, as well as a single brief atrial run.  No significant arrhythmia observed. __________  [facility] MPI 09/2017: Pharmacological myocardial perfusion imaging study with no significant ischemia Normal wall motion, EF estimated at 72% No EKG changes concerning for ischemia at peak stress or in recovery. Nonspecific ST abnormality at rest Low risk scan __________  2D echo 09/2017: - Left ventricle: The cavity size was normal. Systolic function was  normal. The estimated ejection fraction was in the range of 55%  to 60%. Wall motion was normal; there were no regional wall  motion abnormalities. Features are consistent with a pseudonormal  left ventricular filling pattern, with concomitant abnormal  relaxation and increased filling pressure (grade 2 diastolic  dysfunction).  - Left atrium: The atrium was normal in size.  - Right ventricle: Systolic function was normal.  - Pulmonary arteries: Systolic pressure was within the normal  range. __________  2D echo 11/2016: - Left ventricle: There was mild concentric hypertrophy. Systolic  function was normal. The estimated ejection fraction was in the  range of 55% to 60%. Features are consistent with a pseudonormal  left ventricular filling pattern, with concomitant abnormal  relaxation and increased filling pressure (grade 2 diastolic  dysfunction). Doppler parameters are consistent with  indeterminate ventricular filling pressure.  - Aortic valve: Transvalvular velocity was within the  normal range.  There was no stenosis. There was no regurgitation. Valve area  (Vmax): 1.85 cm^2.  - Mitral valve: Transvalvular velocity was within the normal range.  There was no evidence for stenosis. There was no regurgitation.  - Left atrium: The atrium was severely dilated.  - Right ventricle: The cavity size was normal. Wall thickness was  normal. Systolic function was normal.  - Tricuspid valve: There was no regurgitation.  - Pulmonary arteries: Systolic pressure was within the normal  range. PA peak pressure: 26 mm Hg (S). __________  [facility] 11/2016: Conclusions: 1. Anterior STEMI with thrombotic occlusion of the mid LAD, likely due to acute plaque rupture. 2. Moderate diffuse atherosclerotic plaquing of the proximal and mid LAD by IVUS. 3. Mildly elevated left ventricular filling pressure. 4. Successful IVUS-guided PCI to the mid LAD with placement of an Integrity Resolute 3.0 x 18 mm drug-eluting stent, post-dilated in the proximal and mid portions with 3.75 mm noncompliant balloon.  Recommendations: 1. Admit to ICU on hospitalist service. 2. TR band protocol. 3. Continue tirofiban x 4 hours. 4. Dual antiplatelet therapy with aspirin and ticagrelor for at least 12 months. 5. Aggressive secondary prevention, including high-intensity statin therapy. 6. Echocardiogram to evaluate LV function.   EKG:  EKG was not ordered today given recent twelve-lead EKG and outpatient cardiac monitoring.  Recent Labs: 03/03/2020: ALT 23; BUN 18; Creatinine, Ser 0.66; Hemoglobin 12.2; Platelets 270; Potassium 4.1; Sodium 140  Recent Lipid Panel    Component Value Date/Time   CHOL 146 03/03/2020 1052   TRIG 112 03/03/2020 1052   HDL 59 03/03/2020 1052   CHOLHDL 2.5 03/03/2020 1052   CHOLHDL 2.3 09/14/2017 0536   VLDL 9 09/14/2017 0536   LDLCALC 67 03/03/2020 1052   LDLDIRECT 142.2 12/04/2013 1516    PHYSICAL EXAM:    VS:  BP 122/70 (BP Location: Left Arm, Patient Position:  Sitting, Cuff Size: Normal)   Pulse 88   Ht 5' 3"  (1.6 m)   Wt 187 lb 12.8 oz (85.2 kg)   SpO2 98%   BMI 33.27 kg/m   BMI: Body mass index is 33.27 kg/m.  Physical Exam Constitutional:      Appearance: She is well-developed.  HENT:     Head: Normocephalic and atraumatic.  Eyes:     General:        Right eye: No discharge.        Left eye: No discharge.  Neck:     Vascular: No JVD.  Cardiovascular:     Rate and Rhythm: Normal rate and regular rhythm.     Pulses: No midsystolic click and no opening snap.          Posterior tibial pulses are 2+ on the right side and 2+ on the left side.     Heart sounds: Normal heart sounds, S1 normal and S2 normal. Heart sounds not distant. No murmur heard.  No friction rub.  Pulmonary:     Effort: Pulmonary effort is normal. No respiratory distress.     Breath sounds: Normal breath sounds. No decreased breath sounds, wheezing or rales.  Chest:     Chest wall: No tenderness.  Abdominal:     General: There is no distension.     Palpations: Abdomen is soft.     Tenderness: There is no abdominal tenderness.  Musculoskeletal:     Cervical back: Normal range of motion.  Skin:    General: Skin is warm and dry.     Nails: There is no clubbing.  Neurological:     Mental Status: She is alert and oriented to person, place, and time.  Psychiatric:        Speech: Speech normal.        Behavior: Behavior normal.        Thought Content: Thought content normal.        Judgment: Judgment normal.     Wt Readings from Last 3 Encounters:  07/22/20 187 lb 12.8 oz (85.2 kg)  05/21/20 189 lb 6 oz (85.9 kg)  02/18/20 186 lb 2 oz (84.4 kg)     ASSESSMENT & PLAN:   1. Preoperative cardiac risk stratification: She is scheduled to undergo a needle core biopsy through [facility] on 08/09/2020.  Revised Cardiac Index -low risk for noncardiac procedure.  Duke Activity Status Index - > 4 METs without cardiac limitation.  Last PCI 11/2016.  After  discussion with her primary cardiologist she may hold clopidogrel for 7 days before her procedure and restart afterwards as soon as safely possible when the performing physician feels it is safe to do so.  It is our preference, if possible, she continue aspirin 81 mg daily throughout the periprocedural.  No further cardiac testing is needed at this time and she may proceed at an overall low risk.  This note will be routed to [facility] mammography through the [place] fax function.  2. CAD involving the native coronary arteries without angina: She is doing well from a cardiac perspective and has not had any symptoms concerning for angina.  She should continue DAPT outside of the above suspension of clopidogrel in the periprocedural timeframe.  Continue atorvastatin and Zetia.  No plans for further ischemic evaluation at this time.  3. Paroxysmal SVT: Brief run of atrial tach lasting 5 beats on recent outpatient cardiac monitoring.  No indication for standing medical therapy at this time.  Previously, she has been intolerant to beta-blockers secondary to fatigue.  4. HLD: LDL 67 from 02/2020 with normal LFT at that time.  She remains on atorvastatin and ezetimibe.  5. Sleep disordered breathing: Referred to pulmonology for consideration of sleep study.    Disposition: F/u with Dr. Saunders Revel or an APP in 6  months.   Medication Adjustments/Labs and Tests Ordered: Current medicines are reviewed at length with the patient today.  Concerns regarding medicines are outlined above. Medication changes, Labs and Tests ordered today are summarized above and listed in the Patient Instructions accessible in Encounters.   Signed, Christell Faith, PA-C 07/22/2020 3:46 PM     [address], [place] [address] [phone]

## 2020-07-21 ENCOUNTER — Telehealth: Payer: Self-pay | Admitting: Internal Medicine

## 2020-07-21 NOTE — Telephone Encounter (Signed)
   [facility] Pre-operative Risk Assessment    HEARTCARE STAFF: - Please ensure there is not already an duplicate clearance open for this procedure. - Under Visit Info/Reason for Call, type in Other and utilize the format Clearance MM/DD/YY or Clearance TBD. Do not use dashes or single digits. - If request is for dental extraction, please clarify the # of teeth to be extracted.  Request for surgical clearance:  1. What type of surgery is being performed? needlecore biopsy  2. When is this surgery scheduled? 08/09/20  3. What type of clearance is required (medical clearance vs. Pharmacy clearance to hold med vs. Both)? pharm  4. Are there any medications that need to be held prior to surgery and how long? Can pt hold plavix /aspirin prior to biopsy  5. Practice name and name of physician performing surgery? [facility] Dr. Luan Pulling  6. What is the office phone number? [phone]   7.   What is the office fax number? [phone]  8.   Anesthesia type (None, local, MAC, general) ? Not noted   Cassandra Cox 07/21/2020, 1:59 PM  _________________________________________________________________   (provider comments below)

## 2020-07-21 NOTE — Telephone Encounter (Signed)
   Primary Cardiologist: Nelva Bush, MD  Chart reviewed as part of pre-operative protocol coverage. Patient was contacted 07/21/2020 in reference to pre-operative risk assessment for pending surgery as outlined below.  Cassandra Cox was last seen on 05/21/20 by Christell Faith, PA-C. She has history of CAD with anterior ST elevation MI in 11/2016 status post PCI/DES to the LAD, HLD, and anxiety. Palpitations prompted event monitor with 5 beat atrial run, rare PACs/PVCs, echo reassuring. I spoke with the patient over the phone who affirms no new cardiac symptoms. No new chest pain, dyspnea, syncope. Revised cardiac risk index is 0.9% indicating low risk of CV complications. Therefore, based on ACC/AHA guidelines, the patient would be at acceptable risk for the planned procedure without further cardiovascular testing. She is actually seeing Christell Faith tomorrow for follow-up in the afternoon. Will route this message to Dr. Saunders Revel for input on holding her ASA/Plavix as requested. I discussed with [name] the best way to handle this clearance hand-off and he indicates he will look out for Dr. Darnelle Bos reply and finalize the surgical clearance/routing at time of his OV note. [name] will notify the pre-op team in case she is unable to attend her appointment tomorrow for any reason. Will remove from pre-op pool.   [name], PA-C 07/21/2020, 4:32 PM

## 2020-07-22 ENCOUNTER — Other Ambulatory Visit: Payer: Self-pay

## 2020-07-22 ENCOUNTER — Encounter: Payer: Self-pay | Admitting: Physician Assistant

## 2020-07-22 ENCOUNTER — Ambulatory Visit (INDEPENDENT_AMBULATORY_CARE_PROVIDER_SITE_OTHER): Payer: 59 | Admitting: Physician Assistant

## 2020-07-22 VITALS — BP 122/70 | HR 88 | Ht 63.0 in | Wt 187.8 lb

## 2020-07-22 DIAGNOSIS — I2510 Atherosclerotic heart disease of native coronary artery without angina pectoris: Secondary | ICD-10-CM | POA: Diagnosis not present

## 2020-07-22 DIAGNOSIS — G4730 Sleep apnea, unspecified: Secondary | ICD-10-CM

## 2020-07-22 DIAGNOSIS — Z01810 Encounter for preprocedural cardiovascular examination: Secondary | ICD-10-CM

## 2020-07-22 DIAGNOSIS — E785 Hyperlipidemia, unspecified: Secondary | ICD-10-CM | POA: Diagnosis not present

## 2020-07-22 DIAGNOSIS — I471 Supraventricular tachycardia: Secondary | ICD-10-CM

## 2020-07-22 NOTE — Telephone Encounter (Signed)
Interventional cardiology recommendations were discussed with the patient in detail during her office visit today.  This information has been included in her office visit note.  This note was subsequently faxed to [place] and Dr. Luan Pulling via the Epic fax function.

## 2020-07-22 NOTE — Telephone Encounter (Signed)
I think it is fine for Ms. Gattuso to hold clopidogrel for 7 days before her procedure and to restart it afterwards when the performing physician feels it is safe to do so.  If possible, aspirin 81 mg daily should be continued in the periprocedural period.  Nelva Bush, MD [facility]

## 2020-07-22 NOTE — Patient Instructions (Signed)
You may hold Plavix starting on 08/01/20, continue aspirin 81 mg daily. Resume when surgeon says it is ok.   Medication Instructions:  Your physician recommends that you continue on your current medications as directed. Please refer to the Current Medication list given to you today.  *If you need a refill on your cardiac medications before your next appointment, please call your pharmacy*   Follow-Up: At [facility], you and your health needs are our priority.  As part of our continuing mission to provide you with exceptional heart care, we have created designated Provider Care Teams.  These Care Teams include your primary Cardiologist (physician) and Advanced Practice Providers (APPs -  Physician Assistants and Nurse Practitioners) who all work together to provide you with the care you need, when you need it.  We recommend signing up for the patient portal called "MyChart".  Sign up information is provided on this After Visit Summary.  MyChart is used to connect with patients for Virtual Visits (Telemedicine).  Patients are able to view lab/test results, encounter notes, upcoming appointments, etc.  Non-urgent messages can be sent to your provider as well.   To learn more about what you can do with MyChart, go to [email or web address].     You have been referred to Pulmonology for sleep disordered breathing evaluation. Someone will call or you can call [phone].   Your next appointment:   6 month(s)  The format for your next appointment:   In Person  Provider:    You may see Nelva Bush, MD or one of the following Advanced Practice Providers on your designated Care Team:    Murray Hodgkins, NP  Christell Faith, PA-C  Marrianne Mood, PA-C

## 2020-08-11 ENCOUNTER — Telehealth: Payer: Self-pay | Admitting: Hematology

## 2020-08-11 NOTE — Telephone Encounter (Signed)
Spoke with patient to confirm afternoon [facility] appointment for 10/13, Cassandra Cox will mail packet, but emailed a packet just in case, explained surgeon's office will also be calling

## 2020-08-12 ENCOUNTER — Other Ambulatory Visit: Payer: Self-pay | Admitting: *Deleted

## 2020-08-12 MED ORDER — CLOPIDOGREL BISULFATE 75 MG PO TABS: ORAL_TABLET | ORAL | 3 refills | Status: DC

## 2020-08-13 ENCOUNTER — Encounter: Payer: Self-pay | Admitting: *Deleted

## 2020-08-13 DIAGNOSIS — Z170 Estrogen receptor positive status [ER+]: Secondary | ICD-10-CM | POA: Insufficient documentation

## 2020-08-13 DIAGNOSIS — C50411 Malignant neoplasm of upper-outer quadrant of right female breast: Secondary | ICD-10-CM | POA: Insufficient documentation

## 2020-08-17 NOTE — Progress Notes (Signed)
[place]   Telephone:([phone]) [phone] Fax:([phone]) [place] Note   Patient Care Team: Pcp, No as PCP - General End, Harrell Gave, MD as PCP - Cardiology (Cardiology) Mauro Kaufmann, RN as Oncology Nurse Navigator Rockwell Germany, RN as Oncology Nurse Navigator Jovita Kussmaul, MD as Consulting Physician (General Surgery) Truitt Merle, MD as Consulting Physician (Hematology) Kyung Rudd, MD as Consulting Physician (Radiation Oncology)  Date of Service:  08/18/2020   CHIEF COMPLAINTS/PURPOSE OF CONSULTATION:  Newly diagnosed Malignant neoplasm of upper-outer quadrant of right breast    Oncology History Overview Note  Cancer Staging Malignant neoplasm of upper-outer quadrant of right breast in female, estrogen receptor positive ([place]) Staging form: Breast, AJCC 8th Edition - Clinical stage from 08/18/2020: Stage IA (cT1c, cN0, cM0, G3, ER+, PR+, HER2-) - Unsigned    Malignant neoplasm of upper-outer quadrant of right breast in female, estrogen receptor positive ([place])  07/20/2020 Mammogram   IMPRESSION The 1.1 cm irregular mass in the uper outer right breast, 10-11:00 position, 7cm form the nipple is indeterminate.    08/09/2020 Initial Biopsy   Diagnosis 08/09/20 Breast, right, needle core biopsy -INVASIVE MAMMARY CARCINOMA -MAMMARY CARCINOMA IN SITU -CALCIFICATIONS -SEE COMMENT   -Grade 3  E-cadherin is POSITIVE supporting a ductal origin   08/09/2020 Receptors her2   ER 95% PR - 90% Ki67 - 30% HER2 NEGATIVE   08/13/2020 Initial Diagnosis   Malignant neoplasm of upper-outer quadrant of right breast in female, estrogen receptor positive ([place])      HISTORY OF PRESENTING ILLNESS:  Cassandra Cox 59 y.o. female is a here because of newly diagnosed right breast cancer. The patient presents to the Breast clinic today accompanied by her cousin. Her daughter was called to be included in the visit today.   Her mass was found by screening  mammogram. She notes she felt a mild right breast lump but felt it was just lumpy breast tissue. She has been doing mammograms yearly except 2020 due to [place]. This is not her first abnormal mammogram and no other true biopsy in the past 20 years. She denies nay other nipple, breast, skin change lately. She denies baseline pain or new pain except mild left tennis elbow. Her appetite and energy has not changed but her baseline energy is low. She does have hypothyroidism.  She has not had COVID19 vaccines. She notes she has not have the virus but her daughter was asymptomatic when she had it.   Socially she is divorced and lives alone. She has a boyfriend who is close by. She has 2 adult daughters. She is a non-smoker and does not drink. 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  She has a PMHx of borderline DM on metformin. She has hypothyroidism on replacement. She had MI in 2018 and had stent placed. She denies any heart failure. She had hysterectomy in 1998 due to adenomyosis. She had b/l breast reduction. She notes she still has hot flashes and progesterone helps it. She sees psychiatrist for her depression. She notes she has been stressed more about her dog's health than her own. She notes her maternal aunt had breast cancer. I reviewed her medication list with her.     GYN HISTORY  Menarchal: 13 LMP: 07/1997 from hysterectomy  Contraceptive: oral contraceptions for 17 years (1981-1998) HRT: Progesterone for 2-3 years GP: 2 children, first at age 27    REVIEW OF SYSTEMS:    Constitutional: Denies fevers, chills or abnormal  night sweats Eyes: Denies blurriness of vision, double vision or watery eyes Ears, nose, mouth, throat, and face: Denies mucositis or sore throat Respiratory: Denies cough, dyspnea or wheezes Cardiovascular: Denies palpitation, chest discomfort or lower extremity swelling Gastrointestinal:  Denies nausea, heartburn or change in bowel habits Skin: Denies abnormal skin  rashes Lymphatics: Denies new lymphadenopathy or easy bruising Neurological:Denies numbness, tingling or new weaknesses Behavioral/Psych: Mood is stable, no new changes  All other systems were reviewed with the patient and are negative.   MEDICAL HISTORY:  Past Medical History:  Diagnosis Date  . Coronary artery disease    a. 11/2016 Cath/PCI: LM nl, LAD 30ost, 1m(3.0x18 Resolute Integrity DES--3.75), LCX nl, RCA nl; b. MV 11/18: negative for ischemia, EF 72%, low risk  . Depression   . Diabetes mellitus without complication ([place]   . Diastolic dysfunction    a. 11/2016 Echo: EF 55-60%, Gr2 DD, LAE, nl RV size/fxn.  . Hyperlipemia \\[id number]\ . Kidney stone 08/2016  . MVP (mitral valve prolapse)   . STEMI (ST elevation myocardial infarction) ([place] 12/02/2016  . Vitamin B 12 deficiency     SURGICAL HISTORY: Past Surgical History:  Procedure Laterality Date  . ABDOMINAL HYSTERECTOMY  2003   TAH due to adenomyosis  . BREAST REDUCTION SURGERY  1990  . CARDIAC CATHETERIZATION N/A 12/02/2016   Procedure: Coronary Stent Intervention;  Surgeon: CNelva Bush MD;  [facility];  Service: Cardiovascular;  Laterality: N/A;  . CARDIAC CATHETERIZATION N/A 12/02/2016   Procedure: Left Heart Cath and Coronary Angiography;  Surgeon: CNelva Bush MD;  [facility];  Service: Cardiovascular;  Laterality: N/A;  . CARDIAC CATHETERIZATION N/A 12/02/2016   Procedure: Intravascular Ultrasound/IVUS;  Surgeon: CNelva Bush MD;  [facility];  Service: Cardiovascular;  Laterality: N/A;  . CORONARY STENT INTERVENTION    . MOUTH SURGERY     Dental implant    SOCIAL HISTORY: Social History   Socioeconomic History  . Marital status: Divorced    Spouse name: Not on file  . Number of children: Not on file  . Years of education: Not on file  . Highest education level: Not on file  Occupational History    Employer: [employer]  Tobacco Use  . Smoking status: Never Smoker  . Smokeless tobacco: Never Used  Vaping Use  . Vaping Use: Never used  Substance and Sexual Activity  . Alcohol use: No  . Drug use: No  . Sexual activity: Yes    Birth control/protection: Surgical  Other Topics Concern  . Not on file  Social History Narrative  . Not on file   Social Determinants of Health   Financial Resource Strain:   . Difficulty of Paying Living Expenses: Not on file  Food Insecurity:   . Worried About RCharity fundraiserin the Last Year: Not on file  . Ran Out of Food in the Last Year: Not on file  Transportation Needs:   . Lack of Transportation (Medical): Not on file  . Lack of Transportation (Non-Medical): Not on file  Physical Activity:   . Days of Exercise per Week: Not on file  . Minutes of Exercise per Session: Not on file  Stress:   . Feeling of Stress : Not on file  Social Connections:   . Frequency of Communication with Friends and Family: Not on file  . Frequency of Social Gatherings with Friends and Family: Not on file  .  Attends Religious Services: Not on file  . Active Member of Clubs or Organizations: Not on file  . Attends Archivist Meetings: Not on file  . Marital Status: Not on file  Intimate Partner Violence:   . Fear of Current or Ex-Partner: Not on file  . Emotionally Abused: Not on file  . Physically Abused: Not on file  . Sexually Abused: Not on file    FAMILY HISTORY: Family History  Problem Relation Age of Onset  . Heart disease Mother   . Diabetes Mother   . Stroke Mother   . Heart attack Mother   . Atrial fibrillation Mother   . Hypertension Mother   . Hyperlipidemia Mother   . COPD Father   . Hypertension Father   . Hyperlipidemia Father   . Breast cancer Maternal Aunt 57       x2 age 62  . Diabetes Maternal Grandfather     ALLERGIES:  is allergic to prednisone, sulfa antibiotics, sulfasalazine, and sulfonamide derivatives.  MEDICATIONS:   Current Outpatient Medications  Medication Sig Dispense Refill  . ALPRAZolam (XANAX) 0.25 MG tablet Take 0.25 mg by mouth at bedtime as needed for anxiety.    Francia Greaves THYROID 60 MG tablet Take 60 mg by mouth daily.    Marland Kitchen aspirin EC 81 MG tablet Take 81 mg by mouth daily.    Marland Kitchen atorvastatin (LIPITOR) 80 MG tablet TAKE 1 TABLET BY MOUTH DAILY AT 6PM GENERIC EQUIVALENT FOR LIPITOR 90 tablet 3  . buPROPion (WELLBUTRIN XL) 150 MG 24 hr tablet Take 300 mg by mouth daily.     . Cholecalciferol (VITAMIN D-3) 5000 UNITS TABS Take 1 tablet by mouth daily.     . clopidogrel (PLAVIX) 75 MG tablet TAKE 1 TABLET BY MOUTH DAILY GENERIC EQUIVALENT FOR PLAVIX 90 tablet 3  . Cyanocobalamin 1000 MCG/ML LIQD Inject 1 mL as directed once a week.    . ezetimibe (ZETIA) 10 MG tablet TAKE 1 TABLET BY MOUTH DAILY. 90 tablet 3  . FLUoxetine (PROZAC) 20 MG capsule Take 20 mg by mouth daily.     Marland Kitchen levocetirizine (XYZAL) 5 MG tablet Take 1 tablet at bedtime by mouth.  2  . liothyronine (CYTOMEL) 5 MCG tablet Take 10 mcg by mouth every morning.    . magnesium gluconate (MAGONATE) 500 MG tablet Take 1,000 mg by mouth at bedtime.     . metFORMIN (GLUCOPHAGE-XR) 500 MG 24 hr tablet Take 500 mg by mouth 2 (two) times daily.    . montelukast (SINGULAIR) 10 MG tablet Take 10 mg by mouth at bedtime.    Marland Kitchen NALTREXONE HCL PO Take 4 mg by mouth at bedtime.     . nitroGLYCERIN (NITROSTAT) 0.4 MG SL tablet Place 1 tablet (0.4 mg total) under the tongue every 5 (five) minutes as needed for chest pain. For maximum of 3 doses. 25 tablet 3  . progesterone (PROMETRIUM) 200 MG capsule Take 200 mg at bedtime by mouth.  2  . progesterone (PROMETRIUM) 200 MG capsule Take 200 mg by mouth at bedtime.    Marland Kitchen zolpidem (AMBIEN CR) 12.5 MG CR tablet Take 12.5 mg by mouth at bedtime as needed for sleep.     No current facility-administered medications for this visit.    PHYSICAL EXAMINATION: ECOG PERFORMANCE STATUS: 0 - Asymptomatic  Vitals:    08/18/20 1255  BP: 134/74  Pulse: 86  Resp: 18  Temp: 97.7 F (36.5 C)  SpO2: 98%   Filed Weights  08/18/20 1255  Weight: 186 lb 11.2 oz (84.7 kg)    GENERAL:alert, no distress and comfortable SKIN: skin color, texture, turgor are normal, no rashes or significant lesions EYES: normal, Conjunctiva are pink and non-injected, sclera clear  NECK: supple, thyroid normal size, non-tender, without nodularity LYMPH:  no palpable lymphadenopathy in the cervical, axillary  LUNGS: clear to auscultation and percussion with normal breathing effort HEART: regular rate & rhythm and no murmurs and no lower extremity edema ABDOMEN:abdomen soft, non-tender and normal bowel sounds Musculoskeletal:no cyanosis of digits and no clubbing  NEURO: alert & oriented x 3 with fluent speech, no focal motor/sensory deficits BREAST: (+) 2cm Mild fullness 12-1:00 position, 2cmfn. Left Breast exam benign.  LABORATORY DATA:  I have reviewed the data as listed CBC Latest Ref Rng & Units 08/18/2020 03/03/2020 05/14/2019  WBC 4.0 - 10.5 K/uL 6.8 6.9 6.4  Hemoglobin 12.0 - 15.0 g/dL 12.1 12.2 12.7  Hematocrit 36 - 46 % 38.2 37.7 39  Platelets 150 - 400 K/uL 315 270 309    CMP Latest Ref Rng & Units 08/18/2020 03/03/2020 05/14/2019  Glucose 70 - 99 mg/dL 85 94 -  BUN 6 - 20 mg/dL 20 18 16   Creatinine 0.44 - 1.00 mg/dL 0.72 0.66 0.7  Sodium 135 - 145 mmol/L 141 140 10(A)  Potassium 3.5 - 5.1 mmol/L 4.1 4.1 3.8  Chloride 98 - 111 mmol/L 106 104 -  CO2 22 - 32 mmol/L 30 21 -  Calcium 8.9 - 10.3 mg/dL 9.7 9.1 -  Total Protein 6.5 - 8.1 g/dL 7.1 6.5 -  Total Bilirubin 0.3 - 1.2 mg/dL 0.6 0.6 -  Alkaline Phos 38 - 126 U/L 103 99 -  AST 15 - 41 U/L 18 21 -  ALT 0 - 44 U/L 25 23 -     RADIOGRAPHIC STUDIES: I have personally reviewed the radiological images as listed and agreed with the findings in the report. No results found.  ASSESSMENT & PLAN:  Cassandra Cox is a 59 y.o. Caucasian female with a history of  CAD, Depression, HLD, MVP, STEMI in 10/2017, Hypothyroidism.   1. Malignant neoplasm of upper-outer quadrant of right breast, Stage IA, c(T1cN0M0), ER+/PR+/HER2-, Grade III  -We discussed her image findings and the biopsy results in great details. She has 1.2cm mass in the right breast with grade 3 invasive ductal carcinoma with components of DCIS.  -Given the early stage disease, she likely a candidate for right lumpectomy with sentinel LN biopsy. She is agreeable with that. She was seen by Dr. Marlou Starks today and likely will proceed with surgery soon.  -I recommend a Oncotype Dx test on the surgical sample and we'll make a decision about adjuvant chemotherapy based on the Oncotype result. Written material of this test was given to her. She is young and fit, would be a good candidate for chemotherapy if her Oncotype recurrence score is high. -If her surgical sentinel lymph node positive, I recommend mammaprint for further risk stratification and guide adjuvant chemotherapy. -The risk of recurrence depends on the stage and biology of the tumor. She is has early stage disease, with ER/PR positive and HER2 negative markers. However this is high grade disease so it could be high risk disease.  -She was also seen by radiation oncologist Dr. Lisbeth Renshaw today. Adjuvant radiation is recommended after lumpectomy to reduce her risk of local recurrence.   -Given the strong ER and PR expression in her postmenopausal status, I recommend adjuvant endocrine therapy with aromatase inhibitor  with Anastrozole or Exemestane for a total of 5-10 years to reduce the risk of cancer recurrence. Potential benefits and side effects were discussed with patient and she is interested. -We also discussed the breast cancer surveillance after her surgery. She will continue annual screening mammogram, self exam, and a routine office visit with lab and exam with [place]. -Her initial exam today 2cm fullness in her right breast, 12-1:00 position. Labs  reviewed, CBC and CMP WNL.  -I answered all their questions to their understanding and satisfaction.  -F/u after surgery or radiation   2. Hormonal replacement, Hot flashes  -She has hot flashes related to her menopause.  -She has been on progesterone for her hot flashes for the past 2-3 years.  -Given her current breast cancer is ER/PR positive, I advise her to stop hormonal replacements. She is willing to stop.  -If her hot flashes are not tolerable on antiestrogen therapy we can alter or add to her current SSRIs.    3. Comorbidities: MI in 10/2017 S/p stent placement, Depression, Borderline Diabetic, Hypothyroidism, HLD -She sees Psychiatrist to manage depression, cardiologist and PCP. Continue medications    4. Genetic testing  -She has maternal aunt with breast cancer  -we discussed genetic testing, she will think about it    5. Social Support and financial support -She lives alone but has support from daughters, boyfriend and family. -If she is interested in financial help, she can consult with [facility] financial advocate.    PLAN:  -Proceed with surgery soon -Oncotype on her surgical sample  -F/u after surgery or radiation    No orders of the defined types were placed in this encounter.   All questions were answered. The patient knows to call the clinic with any problems, questions or concerns. The total time spent in the appointment was 60 minutes.     Truitt Merle, MD 08/18/2020   I, Joslyn Devon, am acting as scribe for Truitt Merle, MD.   I have reviewed the above documentation for accuracy and completeness, and I agree with the above.

## 2020-08-18 ENCOUNTER — Other Ambulatory Visit: Payer: Self-pay

## 2020-08-18 ENCOUNTER — Inpatient Hospital Stay (HOSPITAL_BASED_OUTPATIENT_CLINIC_OR_DEPARTMENT_OTHER): Payer: 59 | Admitting: Hematology

## 2020-08-18 ENCOUNTER — Ambulatory Visit: Payer: 59 | Attending: General Surgery | Admitting: Physical Therapy

## 2020-08-18 ENCOUNTER — Inpatient Hospital Stay: Payer: 59

## 2020-08-18 ENCOUNTER — Other Ambulatory Visit: Payer: Self-pay | Admitting: *Deleted

## 2020-08-18 ENCOUNTER — Encounter: Payer: Self-pay | Admitting: *Deleted

## 2020-08-18 ENCOUNTER — Ambulatory Visit
Admission: RE | Admit: 2020-08-18 | Discharge: 2020-08-18 | Disposition: A | Discharge status: Home / Self Care | Payer: 59 | Source: Ambulatory Visit | Attending: Radiation Oncology | Admitting: Radiation Oncology

## 2020-08-18 ENCOUNTER — Encounter: Payer: Self-pay | Admitting: Hematology

## 2020-08-18 ENCOUNTER — Encounter: Payer: Self-pay | Admitting: Physical Therapy

## 2020-08-18 VITALS — BP 134/74 | HR 86 | Temp 97.7°F | Resp 18 | Ht 63.0 in | Wt 186.7 lb

## 2020-08-18 DIAGNOSIS — F329 Major depressive disorder, single episode, unspecified: Secondary | ICD-10-CM | POA: Insufficient documentation

## 2020-08-18 DIAGNOSIS — C50411 Malignant neoplasm of upper-outer quadrant of right female breast: Secondary | ICD-10-CM

## 2020-08-18 DIAGNOSIS — E119 Type 2 diabetes mellitus without complications: Secondary | ICD-10-CM

## 2020-08-18 DIAGNOSIS — Z170 Estrogen receptor positive status [ER+]: Secondary | ICD-10-CM

## 2020-08-18 DIAGNOSIS — Z90710 Acquired absence of both cervix and uterus: Secondary | ICD-10-CM

## 2020-08-18 DIAGNOSIS — Z803 Family history of malignant neoplasm of breast: Secondary | ICD-10-CM | POA: Insufficient documentation

## 2020-08-18 DIAGNOSIS — E039 Hypothyroidism, unspecified: Secondary | ICD-10-CM | POA: Insufficient documentation

## 2020-08-18 DIAGNOSIS — I252 Old myocardial infarction: Secondary | ICD-10-CM | POA: Insufficient documentation

## 2020-08-18 DIAGNOSIS — I2510 Atherosclerotic heart disease of native coronary artery without angina pectoris: Secondary | ICD-10-CM

## 2020-08-18 DIAGNOSIS — Z7984 Long term (current) use of oral hypoglycemic drugs: Secondary | ICD-10-CM | POA: Insufficient documentation

## 2020-08-18 DIAGNOSIS — N951 Menopausal and female climacteric states: Secondary | ICD-10-CM

## 2020-08-18 DIAGNOSIS — R293 Abnormal posture: Secondary | ICD-10-CM | POA: Diagnosis present

## 2020-08-18 LAB — CMP (CANCER CENTER ONLY)
ALT: 25 U/L (ref 0–44)
AST: 18 U/L (ref 15–41)
Albumin: 3.7 g/dL (ref 3.5–5.0)
Alkaline Phosphatase: 103 U/L (ref 38–126)
Anion gap: 5 (ref 5–15)
BUN: 20 mg/dL (ref 6–20)
CO2: 30 mmol/L (ref 22–32)
Calcium: 9.7 mg/dL (ref 8.9–10.3)
Chloride: 106 mmol/L (ref 98–111)
Creatinine: 0.72 mg/dL (ref 0.44–1.00)
GFR, Estimated: >60 mL/min (ref >60)
Glucose, Bld: 85 mg/dL (ref 70–99)
Potassium: 4.1 mmol/L (ref 3.5–5.1)
Sodium: 141 mmol/L (ref 135–145)
Total Bilirubin: 0.6 mg/dL (ref 0.3–1.2)
Total Protein: 7.1 g/dL (ref 6.5–8.1)

## 2020-08-18 LAB — CBC WITH DIFFERENTIAL (CANCER CENTER ONLY)
Abs Immature Granulocytes: 0.01 10*3/uL (ref 0.00–0.07)
Basophils Absolute: 0 10*3/uL (ref 0.0–0.1)
Basophils Relative: 0 %
Eosinophils Absolute: 0.1 10*3/uL (ref 0.0–0.5)
Eosinophils Relative: 1 %
HCT: 38.2 % (ref 36.0–46.0)
Hemoglobin: 12.1 g/dL (ref 12.0–15.0)
Immature Granulocytes: 0 %
Lymphocytes Relative: 31 %
Lymphs Abs: 2.1 10*3/uL (ref 0.7–4.0)
MCH: 29.4 pg (ref 26.0–34.0)
MCHC: 31.7 g/dL (ref 30.0–36.0)
MCV: 92.7 fL (ref 80.0–100.0)
Monocytes Absolute: 0.6 10*3/uL (ref 0.1–1.0)
Monocytes Relative: 9 %
Neutro Abs: 4 10*3/uL (ref 1.7–7.7)
Neutrophils Relative %: 59 %
Platelet Count: 315 10*3/uL (ref 150–400)
RBC: 4.12 MIL/uL (ref 3.87–5.11)
RDW: 14.3 % (ref 11.5–15.5)
WBC Count: 6.8 10*3/uL (ref 4.0–10.5)
nRBC: 0 % (ref 0.0–0.2)

## 2020-08-18 LAB — GENETIC SCREENING ORDER

## 2020-08-18 NOTE — Progress Notes (Signed)
a 

## 2020-08-18 NOTE — Progress Notes (Signed)
Radiation Oncology         [phone] ________________________________  Name: Cassandra Cox        MRN: [id number]  Date of Service: 08/18/2020 DOB: 08-05-1961  CC:Pcp, No  Jovita Kussmaul, MD     REFERRING PHYSICIAN: [name], MD   DIAGNOSIS: The encounter diagnosis was Malignant neoplasm of upper-outer quadrant of right breast in female, estrogen receptor positive ([place]).   HISTORY OF PRESENT ILLNESS: Cassandra Cox is a 59 y.o. female seen in the multidisciplinary breast clinic for a new diagnosis of right breast cancer. The patient was noted to have a screening detected distortion in the right breast in the upper outer quadrant.  The patient proceeded with diagnostic imaging which revealed this architectural distortion measuring approximately 1.2 cm.  She also underwent ultrasound on 07/20/2020 that did not reveal any ultrasound correlate.  Her axilla was negative for adenopathy, and she subsequently underwent a stereotactic biopsy revealed a grade 3 invasive ductal carcinoma with associated DCIS.  Her tumor was ER/PR positive, HER-2 was negative and Ki-67 was 30%.  She is seen today to discuss treatment recommendations for her cancer.   PREVIOUS RADIATION THERAPY: No   PAST MEDICAL HISTORY:  Past Medical History:  Diagnosis Date  . Coronary artery disease    a. 11/2016 Cath/PCI: LM nl, LAD 30ost, 54m(3.0x18 Resolute Integrity DES--3.75), LCX nl, RCA nl; b. MV 11/18: negative for ischemia, EF 72%, low risk  . Depression   . Diastolic dysfunction    a. 11/2016 Echo: EF 55-60%, Gr2 DD, LAE, nl RV size/fxn.  . Hyperlipemia \\[id number]\ . Kidney stone 08/2016  . MVP (mitral valve prolapse)   . STEMI (ST elevation myocardial infarction) ([place] 12/02/2016  . Vitamin B 12 deficiency        PAST SURGICAL HISTORY: Past Surgical History:  Procedure Laterality Date  . ABDOMINAL HYSTERECTOMY  2003   TAH due to adenomyosis  . BREAST REDUCTION SURGERY  1990  . CARDIAC  CATHETERIZATION N/A 12/02/2016   Procedure: Coronary Stent Intervention;  Surgeon: CNelva Bush MD;  [facility];  Service: Cardiovascular;  Laterality: N/A;  . CARDIAC CATHETERIZATION N/A 12/02/2016   Procedure: Left Heart Cath and Coronary Angiography;  Surgeon: CNelva Bush MD;  [facility];  Service: Cardiovascular;  Laterality: N/A;  . CARDIAC CATHETERIZATION N/A 12/02/2016   Procedure: Intravascular Ultrasound/IVUS;  Surgeon: CNelva Bush MD;  [facility];  Service: Cardiovascular;  Laterality: N/A;  . CORONARY STENT INTERVENTION    . MOUTH SURGERY     Dental implant     FAMILY HISTORY:  Family History  Problem Relation Age of Onset  . Heart disease Mother   . Diabetes Mother   . Stroke Mother   . Heart attack Mother   . Atrial fibrillation Mother   . Hypertension Mother   . Hyperlipidemia Mother   . COPD Father   . Hypertension Father   . Hyperlipidemia Father   . Breast cancer Maternal Aunt 611      x2 age 60 . Diabetes Maternal Grandfather      SOCIAL HISTORY:  reports that she has never smoked. She has never used smokeless tobacco. She reports that she does not drink alcohol and does not use drugs.  [place]  She works for rAudiological scientistas a bClinical cytogeneticist    ALLERGIES: Prednisone, Sulfa antibiotics, Sulfasalazine, and Sulfonamide derivatives  MEDICATIONS:  Current Outpatient Medications  Medication Sig Dispense Refill  . ALPRAZolam (XANAX) 0.25 MG tablet Take 0.25 mg by mouth at bedtime as needed for anxiety.    Francia Greaves THYROID 60 MG tablet Take 60 mg by mouth daily.    Marland Kitchen aspirin EC 81 MG tablet Take 81 mg by mouth daily.    Marland Kitchen atorvastatin (LIPITOR) 80 MG tablet TAKE 1 TABLET BY MOUTH DAILY AT 6PM GENERIC EQUIVALENT FOR LIPITOR 90 tablet 3  . buPROPion (WELLBUTRIN XL) 150 MG 24 hr tablet Take 300 mg by mouth daily.     . Cholecalciferol (VITAMIN D-3)  5000 UNITS TABS Take 1 tablet by mouth daily.     . clopidogrel (PLAVIX) 75 MG tablet TAKE 1 TABLET BY MOUTH DAILY GENERIC EQUIVALENT FOR PLAVIX 90 tablet 3  . Cyanocobalamin 1000 MCG/ML LIQD Inject 1 mL as directed once a week.    . ezetimibe (ZETIA) 10 MG tablet TAKE 1 TABLET BY MOUTH DAILY. 90 tablet 3  . FLUoxetine (PROZAC) 20 MG capsule Take 20 mg by mouth daily.     Marland Kitchen levocetirizine (XYZAL) 5 MG tablet Take 1 tablet at bedtime by mouth.  2  . liothyronine (CYTOMEL) 5 MCG tablet Take 10 mcg by mouth every morning.    . magnesium gluconate (MAGONATE) 500 MG tablet Take 1,000 mg by mouth at bedtime.     . metFORMIN (GLUCOPHAGE) 500 MG tablet Take 500 mg by mouth 2 (two) times daily with a meal.    . metFORMIN (GLUCOPHAGE-XR) 500 MG 24 hr tablet Take 500 mg by mouth 2 (two) times daily.    . montelukast (SINGULAIR) 10 MG tablet Take 10 mg by mouth at bedtime.    Marland Kitchen NALTREXONE HCL PO Take 4 mg by mouth at bedtime.     . nitroGLYCERIN (NITROSTAT) 0.4 MG SL tablet Place 1 tablet (0.4 mg total) under the tongue every 5 (five) minutes as needed for chest pain. For maximum of 3 doses. 25 tablet 3  . progesterone (PROMETRIUM) 200 MG capsule Take 200 mg at bedtime by mouth.  2  . progesterone (PROMETRIUM) 200 MG capsule Take 200 mg by mouth at bedtime.    Marland Kitchen zolpidem (AMBIEN CR) 12.5 MG CR tablet Take 12.5 mg by mouth at bedtime as needed for sleep.     No current facility-administered medications for this encounter.     REVIEW OF SYSTEMS: On review of systems, the patient reports that she is doing well overall. She reports having fatigue. She describes poor posture as a result of having large breasts.      PHYSICAL EXAM:  Wt Readings from Last 3 Encounters:  07/22/20 187 lb 12.8 oz (85.2 kg)  05/21/20 189 lb 6 oz (85.9 kg)  02/18/20 186 lb 2 oz (84.4 kg)   Temp Readings from Last 3 Encounters:  10/18/17 98.3 F (36.8 C) (Oral)  09/14/17 98.2 F (36.8 C) (Oral)  12/11/16 98.3 F (36.8 C)  (Oral)   BP Readings from Last 3 Encounters:  07/22/20 122/70  05/21/20 120/68  02/18/20 (!) 130/92   Pulse Readings from Last 3 Encounters:  07/22/20 88  05/21/20 95  02/18/20 93    In general this is a well appearing Caucasian female in no acute distress. She's alert and oriented x4 and appropriate throughout the examination. Cardiopulmonary assessment is negative for acute distress and she exhibits normal effort. Bilateral breast exam is deferred.    ECOG = 1  0 - Asymptomatic (Fully active, able  to carry on all predisease activities without restriction)  1 - Symptomatic but completely ambulatory (Restricted in physically strenuous activity but ambulatory and able to carry out work of a light or sedentary nature. For example, light housework, office work)  2 - Symptomatic, <50% in bed during the day (Ambulatory and capable of all self care but unable to carry out any work activities. Up and about more than 50% of waking hours)  3 - Symptomatic, >50% in bed, but not bedbound (Capable of only limited self-care, confined to bed or chair 50% or more of waking hours)  4 - Bedbound (Completely disabled. Cannot carry on any self-care. Totally confined to bed or chair)  5 - Death   Eustace Pen MM, Creech RH, Tormey DC, et al. [phone]). "Toxicity and response criteria of the [facility]". [place] Oncol. 5 (6): 649-55    LABORATORY DATA:  Lab Results  Component Value Date   WBC 6.9 03/03/2020   HGB 12.2 03/03/2020   HCT 37.7 03/03/2020   MCV 92 03/03/2020   PLT 270 03/03/2020   Lab Results  Component Value Date   NA 140 03/03/2020   K 4.1 03/03/2020   CL 104 03/03/2020   CO2 21 03/03/2020   Lab Results  Component Value Date   ALT 23 03/03/2020   AST 21 03/03/2020   ALKPHOS 99 03/03/2020   BILITOT 0.6 03/03/2020      RADIOGRAPHY: No results found.     IMPRESSION/PLAN: 1. Stage IA, cT1cN0M0, grade 3, ER/PR positive invasive ductal carcinoma of  the right breast. Dr. Lisbeth Renshaw discusses the pathology findings and reviews the nature of invasive right breast disease. The consensus from the breast conference includes breast conservation with lumpectomy with sentinel node biopsy. She is also planning to discuss options of oncoplastic reduction due to her large breasts. This has yet to be determined. Dr. Burr Medico plans for Oncotype Dx score to determine a role for systemic therapy. Provided that chemotherapy is not indicated, the patient's course would then be followed by external radiotherapy to the breast followed by antiestrogen therapy. We discussed the risks, benefits, short, and long term effects of radiotherapy, and the patient is interested in proceeding. Dr. Lisbeth Renshaw discusses the delivery and logistics of radiotherapy and anticipates a course of 4 or 6 1/2 weeks of radiotherapy, leaning toward 4 weeks given the current work up. We will see her back a few weeks after surgery provided she undergoes breast conservation surgery to discuss the simulation process and anticipate we starting radiotherapy about 4-6 weeks after surgery.   In a visit lasting 60 minutes, greater than 50% of the time was spent face to face reviewing her case, as well as in preparation of, discussing, and coordinating the patient's care.  The above documentation reflects my direct findings during this shared patient visit. Please see the separate note by Dr. Lisbeth Renshaw on this date for the remainder of the patient's plan of care.    Carola Rhine, PAC

## 2020-08-18 NOTE — Patient Instructions (Signed)

## 2020-08-18 NOTE — Therapy (Signed)
[place], [place], [address] Phone: [phone]   Fax:  [phone]  Physical Therapy Evaluation  Patient Details  Name: Cassandra Cox MRN: [id number] Date of Birth: 1961/03/02 Referring Provider (PT): Dr. Autumn Messing   Encounter Date: 08/18/2020   PT End of Session - 08/18/20 1534    Visit Number 1    Number of Visits 2    Date for PT Re-Evaluation 10/13/20    PT Start Time 1400    PT Stop Time 1411   Also saw pt from 1458-1325 for a total of 38 minutes   PT Time Calculation (min) 11 min    Activity Tolerance Patient tolerated treatment well    Behavior During Therapy [facility] for tasks assessed/performed           Past Medical History:  Diagnosis Date  . Coronary artery disease    a. 11/2016 Cath/PCI: LM nl, LAD 30ost, 27m(3.0x18 Resolute Integrity DES--3.75), LCX nl, RCA nl; b. MV 11/18: negative for ischemia, EF 72%, low risk  . Depression   . Diabetes mellitus without complication ([place]   . Diastolic dysfunction    a. 11/2016 Echo: EF 55-60%, Gr2 DD, LAE, nl RV size/fxn.  . Hyperlipemia \\[id number]\ . Kidney stone 08/2016  . MVP (mitral valve prolapse)   . STEMI (ST elevation myocardial infarction) ([place] 12/02/2016  . Vitamin B 12 deficiency     Past Surgical History:  Procedure Laterality Date  . ABDOMINAL HYSTERECTOMY  2003   TAH due to adenomyosis  . BREAST REDUCTION SURGERY  1990  . CARDIAC CATHETERIZATION N/A 12/02/2016   Procedure: Coronary Stent Intervention;  Surgeon: CNelva Bush MD;  [facility];  Service: Cardiovascular;  Laterality: N/A;  . CARDIAC CATHETERIZATION N/A 12/02/2016   Procedure: Left Heart Cath and Coronary Angiography;  Surgeon: CNelva Bush MD;  [facility];  Service: Cardiovascular;  Laterality: N/A;  . CARDIAC CATHETERIZATION N/A 12/02/2016   Procedure: Intravascular Ultrasound/IVUS;  Surgeon: CNelva Bush MD;   [facility];  Service: Cardiovascular;  Laterality: N/A;  . CORONARY STENT INTERVENTION    . MOUTH SURGERY     Dental implant    There were no vitals filed for this visit.    Subjective Assessment - 08/18/20 1443    Subjective Patient reports she is here today to be seen by hre medical team for her newly diagnosed right breast cancer.    Patient is accompained by: Family member    Pertinent History Patient was diagnosed on 06/30/2020 with right grade III invasive ductal carcinoma breast cancer. It measures 1.2 cm in distortion and is located in the upper outer quadrant. It is ER/PR positive and HER2 negative with a Ki67 of 30%.    Patient Stated Goals Reduce lymphedema risk and learn post op shoulder ROM HEP    Currently in Pain? No/denies              [facility] - 08/18/20 0001      Assessment   Medical Diagnosis Right breast cancer    Referring Provider (PT) Dr. PAutumn Messing   Onset Date/Surgical Date 06/30/20    Hand Dominance Right    Prior Therapy none      Precautions   Precautions Other (comment)    Precaution Comments active cancer      Restrictions   Weight Bearing Restrictions No      Balance Screen  Has the patient fallen in the past 6 months No    Has the patient had a decrease in activity level because of a fear of falling?  No    Is the patient reluctant to leave their home because of a fear of falling?  No      Home Environment   Living Environment Private residence    Living Arrangements Alone    Available Help at Discharge Family      Prior Function   Level of [place] Retired    Leisure She does not exercise      Cognition   Overall Cognitive Status Within Functional Limits for tasks assessed      Posture/Postural Control   Posture/Postural Control Postural limitations    Postural Limitations Rounded Shoulders;Forward head;Increased thoracic kyphosis    Posture Comments Bilateral superior  shoulder indentions from her bra straps      ROM / Strength   AROM / PROM / Strength AROM;Strength      AROM   Overall AROM Comments Cervical AROM is WNL    AROM Assessment Site Shoulder    Right/Left Shoulder Right;Left    Right Shoulder Extension 54 Degrees    Right Shoulder Flexion 154 Degrees    Right Shoulder ABduction 150 Degrees    Right Shoulder Internal Rotation 58 Degrees    Right Shoulder External Rotation 87 Degrees    Left Shoulder Extension 60 Degrees    Left Shoulder Flexion 143 Degrees    Left Shoulder ABduction 155 Degrees    Left Shoulder Internal Rotation 80 Degrees    Left Shoulder External Rotation 85 Degrees      Strength   Overall Strength Within functional limits for tasks performed             LYMPHEDEMA/ONCOLOGY QUESTIONNAIRE - 08/18/20 0001      Type   Cancer Type Right breast cancer      Lymphedema Assessments   Lymphedema Assessments Upper extremities      Right Upper Extremity Lymphedema   10 cm Proximal to Olecranon Process 30.8 cm    Olecranon Process 25.9 cm    10 cm Proximal to Ulnar Styloid Process 23 cm    Just Proximal to Ulnar Styloid Process 15.9 cm    Across Hand at [employer] 19.8 cm    At [place] of 2nd Digit 6.3 cm      Left Upper Extremity Lymphedema   10 cm Proximal to Olecranon Process 31 cm    Olecranon Process 25.7 cm    10 cm Proximal to Ulnar Styloid Process 22.3 cm    Just Proximal to Ulnar Styloid Process 16.3 cm    Across Hand at [employer] 19.6 cm    At [place] of 2nd Digit 6.4 cm           L-DEX FLOWSHEETS - 08/18/20 1500      L-DEX LYMPHEDEMA SCREENING   Measurement Type Unilateral    POSITION  Standing    DOMINANT SIDE Right    At Risk Side Right    BASELINE SCORE (UNILATERAL) -2.2            The patient was assessed using the L-Dex machine today to produce a lymphedema index baseline score. The patient will be reassessed on a regular basis (typically every 3 months) to obtain new L-Dex  scores. If the score is > 6.5 points away from his/her baseline score indicating onset of subclinical lymphedema,  it will be recommended to wear a compression garment for 4 weeks, 12 hours per day and then be reassessed. If the score continues to be > 6.5 points from baseline at reassessment, we will initiate lymphedema treatment. Assessing in this manner has a 95% rate of preventing clinically significant lymphedema.     Katina Dung - 08/18/20 0001    Open a tight or new jar No difficulty    Do heavy household chores (wash walls, wash floors) No difficulty    Carry a shopping bag or briefcase No difficulty    Wash your back No difficulty    Use a knife to cut food No difficulty    Recreational activities in which you take some force or impact through your arm, shoulder, or hand (golf, hammering, tennis) No difficulty    During the past week, to what extent has your arm, shoulder or hand problem interfered with your normal social activities with family, friends, neighbors, or groups? Not at all    During the past week, to what extent has your arm, shoulder or hand problem limited your work or other regular daily activities Not at all    Arm, shoulder, or hand pain. None    Tingling (pins and needles) in your arm, shoulder, or hand None    Difficulty Sleeping No difficulty    DASH Score 0 %            Objective measurements completed on examination: See above findings.        Patient was instructed today in a home exercise program today for post op shoulder range of motion. These included active assist shoulder flexion in sitting, scapular retraction, wall walking with shoulder abduction, and hands behind head external rotation.  She was encouraged to do these twice a day, holding 3 seconds and repeating 5 times when permitted by her physician.           PT Education - 08/18/20 1532    Education Details Lymphedema risk reduction and post op shoulder ROM HEP    Person(s)  Educated Patient;Other (comment)   Cousin with daughter on phone   Methods Explanation;Demonstration;Handout    Comprehension Returned demonstration;Verbalized understanding               PT Long Term Goals - 08/18/20 1537      PT LONG TERM GOAL #1   Title Patient will demonstrate she has regained full shoulder ROM and function post operatively compared to baselines.    Time 8    Period Weeks    Target Date 10/13/20                  Plan - 08/18/20 1535    Clinical Impression Statement Patient was diagnosed on 06/30/2020 with right grade III invasive ductal carcinoma breast cancer. It measures 1.2 cm in distortion and is located in the upper outer quadrant. It is ER/PR positive and HER2 negative with a Ki67 of 30%. Her multidisciplinary medical team met prior to her assessments to determine a recommended treatment plan. She is planning to have a right lumpectomy and sentinel node biopsy followed by Oncotype testing, radiation, and anti-estrogen therapy. She will benefit from a post op PT reassessment to determine needs and from L-Dex screens every 3 months for 2 years to detect subclinical lymphedema.    Stability/Clinical Decision Making Stable/Uncomplicated    Clinical Decision Making Low    Rehab Potential Excellent    PT Frequency --   Eval and  1 f/u visit   PT Treatment/Interventions ADLs/Self Care Home Management;Therapeutic exercise;Patient/family education    PT Next Visit Plan Will reassess 3-4 weeks post op    PT Home Exercise Plan Post op shoulder ROM HEP    Consulted and Agree with Plan of Care Patient;Family member/caregiver    Family Member Consulted cousin           Patient will benefit from skilled therapeutic intervention in order to improve the following deficits and impairments:  Postural dysfunction, Decreased range of motion, Decreased knowledge of precautions, Impaired UE functional use  Visit Diagnosis: Malignant neoplasm of upper-outer quadrant  of right breast in female, estrogen receptor positive ([place]) - Plan: PT plan of care cert/re-cert  Abnormal posture - Plan: PT plan of care cert/re-cert   Patient will follow up at outpatient cancer rehab 3-4 weeks following surgery.  If the patient requires physical therapy at that time, a specific plan will be dictated and sent to the referring physician for approval. The patient was educated today on appropriate basic range of motion exercises to begin post operatively and the importance of attending the After Breast Cancer class following surgery.  Patient was educated today on lymphedema risk reduction practices as it pertains to recommendations that will benefit the patient immediately following surgery.  She verbalized good understanding.      Problem List Patient Active Problem List   Diagnosis Date Noted  . Malignant neoplasm of upper-outer quadrant of right breast in female, estrogen receptor positive ([place]) 08/13/2020  . Shortness of breath 02/19/2020  . Essential hypertension 09/09/2018  . Influenza B 10/18/2017  . Unstable angina ([place]) 09/14/2017  . Coronary artery disease involving native coronary artery of native heart without angina pectoris 07/18/2017  . Insomnia 08/25/2015  . Hot flashes, menopausal 08/25/2015  . Well woman exam 07/22/2015  . Family history of diabetes mellitus 07/15/2014  . Vitamin D deficiency 11/03/2009  . Hyperlipidemia LDL goal <70 11/03/2009  . ANXIETY 11/03/2009  . Depression 11/03/2009   Annia Friendly, PT 08/18/20 4:17 PM  [place], [place], [address] Phone: [phone]   Fax:  [phone]  Name: LYNZIE CLIBURN MRN: [id number] Date of Birth: [birth year]

## 2020-08-19 ENCOUNTER — Telehealth: Payer: Self-pay | Admitting: Internal Medicine

## 2020-08-19 NOTE — Telephone Encounter (Signed)
   Primary Cardiologist: Nelva Bush, MD  Chart reviewed as part of pre-operative protocol coverage. Given past medical history and time since last visit, based on ACC/AHA guidelines, Cassandra Cox would be at acceptable risk for the planned procedure without further cardiovascular testing.   Her Plavix may be held for 5 days prior to her procedure.  Please resume as soon as hemostasis is achieved.  Her aspirin will need to be continued through her procedure.  I will route this recommendation to the requesting party via Epic fax function and remove from pre-op pool.  Please call with questions.  Jossie Ng. [name] NP-C    08/19/2020, 10:44 AM [name] Office [phone] Fax [phone]

## 2020-08-19 NOTE — Progress Notes (Unsigned)
Met with [place] in [facility] to introduce [facility] team/resources, reviewing distress screen per protocol.  The patient scored a 7on the Psychosocial Distress Thermometer which indicates moderate/severe distress. Also assessed for distress and other psychosocial needs.   Gaynell was accompanied by her cousin and her daughter, Margreta Journey was listening over the phone.  Railey reports that her distress at the time of our conversation is lower than it was when she completed the form.  She is aware that she has three options for surgery: a lumpectomy, a lumpectomy with double breast reduction, and a mastectomy.  Shirlie shared that she is a slower processor and finds it helpful to have time to consider her options.  She is aware that she will probably come up with more questions once all of the information she learned sinks in.  [place] was eager to learn about all of the support available to her.  She would like to take some time to consider participating in the peer mentor program and wrote down dates for the Breast Cancer Support group.  She is hopeful that might be a place to hear how others made the decision about what type of surgery they decided on.  We discussed setting appropriate boundaries with well meaning loved ones and her tendency to support them when they are calling to support her.    Follow up needed: Yes        [address] [place], [place], [facility]  Pager [phone]  Voicemail [phone]

## 2020-08-19 NOTE — Telephone Encounter (Signed)
   [facility] Pre-operative Risk Assessment    HEARTCARE STAFF: - Please ensure there is not already an duplicate clearance open for this procedure. - Under Visit Info/Reason for Call, type in Other and utilize the format Clearance MM/DD/YY or Clearance TBD. Do not use dashes or single digits. - If request is for dental extraction, please clarify the # of teeth to be extracted.  Request for surgical clearance:  1. What type of surgery is being performed? malingnant neoplasm of central portion of right breast  2. When is this surgery scheduled? TBD  3. What type of clearance is required (medical clearance vs. Pharmacy clearance to hold med vs. Both)? pharm  4. Are there any medications that need to be held prior to surgery and how long? Advise on holding plavix 5 days prior  5. Practice name and name of physician performing surgery? [facility] Autumn Messing  6. What is the office phone number? [phone]   7.   What is the office fax number? [phone]  8.   Anesthesia type (None, local, MAC, general) ? Not noted   Marykay Lex 08/19/2020, 10:22 AM  _________________________________________________________________   (provider comments below)

## 2020-08-24 ENCOUNTER — Institutional Professional Consult (permissible substitution): Payer: 59 | Admitting: Plastic Surgery

## 2020-08-26 ENCOUNTER — Telehealth: Payer: Self-pay | Admitting: *Deleted

## 2020-08-26 ENCOUNTER — Encounter: Payer: Self-pay | Admitting: *Deleted

## 2020-08-26 NOTE — Telephone Encounter (Signed)
Left message for a return phone call to follow up from [facility] last week.

## 2020-08-27 ENCOUNTER — Ambulatory Visit (INDEPENDENT_AMBULATORY_CARE_PROVIDER_SITE_OTHER): Payer: 59 | Admitting: Plastic Surgery

## 2020-08-27 ENCOUNTER — Other Ambulatory Visit: Payer: Self-pay

## 2020-08-27 ENCOUNTER — Encounter: Payer: Self-pay | Admitting: Plastic Surgery

## 2020-08-27 VITALS — BP 127/81 | HR 85 | Temp 98.3°F | Ht 62.75 in | Wt 186.0 lb

## 2020-08-27 DIAGNOSIS — C50411 Malignant neoplasm of upper-outer quadrant of right female breast: Secondary | ICD-10-CM | POA: Diagnosis not present

## 2020-08-27 DIAGNOSIS — I2510 Atherosclerotic heart disease of native coronary artery without angina pectoris: Secondary | ICD-10-CM

## 2020-08-27 DIAGNOSIS — I10 Essential (primary) hypertension: Secondary | ICD-10-CM

## 2020-08-27 DIAGNOSIS — Z170 Estrogen receptor positive status [ER+]: Secondary | ICD-10-CM | POA: Diagnosis not present

## 2020-08-27 NOTE — Progress Notes (Signed)
Patient ID: Cassandra Cox, female    DOB: February 22, 1961, 59 y.o.   MRN: [id number]   Chief Complaint  Patient presents with  . Advice Only  . Breast Cancer    The patient is a 59 year old female here with her daughter for consultation for breast reconstruction.  She also had her cousin joined [place] by phone.  The patient underwent a screening mammogram and had concerns about a right breast lump.  The biopsy showed invasive ductal carcinoma with DCIS.  It is estrogen and progesterone positive and HER-2 negative.  She is 5 feet 2 inches tall and weighs 186 pounds.  Her preoperative bra size is a 38 DDD.  She would like to be smaller.   The patient is divorced but has a boyfriend that lives in town.  She has two adult daughters.  She is not a smoker does not drink.  She is prediabetic and on Metformin.  She has anxiety/depression and thyroid disease.  She had an a myocardial infarction in 2018 at which time a stent was placed.  She also had a hysterectomy in 1998.  The breast reduction was bilateral and appears to be an inferior pedicle technique which was done at [facility] 20 years ago.  She has a maternal aunt that had breast cancer.  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  She is interested in breast conserving surgery and would like to have the breasts made smaller at the same time if it is possible.  She is quite nervous today and her daughter was a big help.   Review of Systems  Constitutional: Negative for activity change and appetite change.  Eyes: Negative.   Respiratory: Negative.   Cardiovascular: Negative.   Gastrointestinal: Negative.  Negative for abdominal distention and abdominal pain.  Endocrine: Negative.   Genitourinary: Negative.   Musculoskeletal: Positive for back pain.  Neurological: Negative.   Hematological: Negative.   Psychiatric/Behavioral: The patient is nervous/anxious.     Past Medical History:  Diagnosis Date  . Coronary artery disease    a. 11/2016 Cath/PCI: LM nl,  LAD 30ost, 3m(3.0x18 Resolute Integrity DES--3.75), LCX nl, RCA nl; b. MV 11/18: negative for ischemia, EF 72%, low risk  . Depression   . Diabetes mellitus [place] complication [place]   . Diastolic dysfunction    a. 11/2016 Echo: EF 55-60%, Gr2 DD, LAE, nl RV size/fxn.  . Hyperlipemia \\[id number]\ . Kidney stone 08/2016  . MVP (mitral valve prolapse)   . STEMI (ST elevation myocardial infarction) [place] 12/02/2016  . Vitamin B 12 deficiency     Past Surgical History:  Procedure Laterality Date  . ABDOMINAL HYSTERECTOMY  2003   TAH due to adenomyosis  . BREAST REDUCTION SURGERY  1990  . CARDIAC CATHETERIZATION N/A 12/02/2016   Procedure: Coronary Stent Intervention;  Surgeon: CNelva Bush MD;  [facility];  Service: Cardiovascular;  Laterality: N/A;  . CARDIAC CATHETERIZATION N/A 12/02/2016   Procedure: Left Heart Cath and Coronary Angiography;  Surgeon: CNelva Bush MD;  [facility];  Service: Cardiovascular;  Laterality: N/A;  . CARDIAC CATHETERIZATION N/A 12/02/2016   Procedure: Intravascular Ultrasound/IVUS;  Surgeon: CNelva Bush MD;  [facility];  Service: Cardiovascular;  Laterality: N/A;  . CORONARY STENT INTERVENTION    . MOUTH SURGERY     Dental implant      Current Outpatient Medications:  .  ALPRAZolam (XANAX) 0.25 MG tablet, Take 0.25 mg by mouth  at bedtime as needed for anxiety., Disp: , Rfl:  .  ARMOUR THYROID 60 MG tablet, Take 60 mg by mouth daily., Disp: , Rfl:  .  aspirin EC 81 MG tablet, Take 81 mg by mouth daily., Disp: , Rfl:  .  atorvastatin (LIPITOR) 80 MG tablet, TAKE 1 TABLET BY MOUTH DAILY AT 6PM GENERIC EQUIVALENT FOR LIPITOR, Disp: 90 tablet, Rfl: 3 .  buPROPion (WELLBUTRIN XL) 150 MG 24 hr tablet, Take 300 mg by mouth daily. , Disp: , Rfl:  .  Cholecalciferol (VITAMIN D-3) 5000 UNITS TABS, Take 1 tablet by mouth daily. , Disp: , Rfl:  .  clopidogrel (PLAVIX) 75 MG tablet, TAKE 1 TABLET BY  MOUTH DAILY GENERIC EQUIVALENT FOR PLAVIX, Disp: 90 tablet, Rfl: 3 .  Cyanocobalamin 1000 MCG/ML LIQD, Inject 1 mL as directed once a week., Disp: , Rfl:  .  ezetimibe (ZETIA) 10 MG tablet, TAKE 1 TABLET BY MOUTH DAILY., Disp: 90 tablet, Rfl: 3 .  FLUoxetine (PROZAC) 20 MG capsule, Take 20 mg by mouth daily. , Disp: , Rfl:  .  levocetirizine (XYZAL) 5 MG tablet, Take 1 tablet at bedtime by mouth., Disp: , Rfl: 2 .  liothyronine (CYTOMEL) 5 MCG tablet, Take 10 mcg by mouth every morning., Disp: , Rfl:  .  magnesium gluconate (MAGONATE) 500 MG tablet, Take 1,000 mg by mouth at bedtime. , Disp: , Rfl:  .  metFORMIN (GLUCOPHAGE-XR) 500 MG 24 hr tablet, Take 500 mg by mouth 2 (two) times daily., Disp: , Rfl:  .  montelukast (SINGULAIR) 10 MG tablet, Take 10 mg by mouth at bedtime., Disp: , Rfl:  .  NALTREXONE HCL PO, Take 4 mg by mouth at bedtime. , Disp: , Rfl:  .  nitroGLYCERIN (NITROSTAT) 0.4 MG SL tablet, Place 1 tablet (0.4 mg total) under the tongue every 5 (five) minutes as needed for chest pain. For maximum of 3 doses., Disp: 25 tablet, Rfl: 3 .  zolpidem (AMBIEN CR) 12.5 MG CR tablet, Take 12.5 mg by mouth at bedtime as needed for sleep., Disp: , Rfl:  .  progesterone (PROMETRIUM) 200 MG capsule, Take 200 mg at bedtime by mouth. (Patient not taking: Reported on 08/27/2020), Disp: , Rfl: 2   Objective:   Vitals:   08/27/20 1201  BP: 127/81  Pulse: 85  Temp: 98.3 F (36.8 C)  SpO2: 99%    Physical Exam Vitals and nursing note reviewed.  Constitutional:      Appearance: Normal appearance.  HENT:     Head: Normocephalic and atraumatic.  Eyes:     Extraocular Movements: Extraocular movements intact.  Cardiovascular:     Rate and Rhythm: Normal rate.     Pulses: Normal pulses.  Pulmonary:     Effort: Pulmonary effort is normal. No respiratory distress.     Breath sounds: No wheezing.  Abdominal:     General: Abdomen is flat. There is no distension.     Tenderness: There is no  abdominal tenderness.  Skin:    General: Skin is warm.     Capillary Refill: Capillary refill takes less than 2 seconds.  Neurological:     General: No focal deficit present.     Mental Status: She is alert and oriented to person, place, and time.  Psychiatric:        Mood and Affect: Mood normal.        Behavior: Behavior normal.        Thought Content: Thought content normal.  Assessment & Plan:  Malignant neoplasm of upper-outer quadrant of right breast in female, estrogen receptor positive ([place])  Essential hypertension  Coronary artery disease involving native coronary artery of native heart without angina pectoris  We had a detailed conversation about the patient's options for breast reconstruction. Several reconstruction options were explained to the patient.  It is important to remember that breast reconstruction is an optional procedure. Reconstruction often requires several stages of surgery and this means more than one operation.  The surgeries are often done several months apart.  The entire process from start to finish can take a year or more. The major goal of breast reconstruction is to look normal in clothing. There will always be scars and a difference noticeable without clothes.  This is true for asymmetries where both breasts will not be identical.  Surgery may be needed or desired to the non-cancerous breast in order to achieve better symmetry and satisfactory results.  Regardless of the reconstructive method, there is always risks and the possibility that the procedure will fail or have complications.  This couls required additional surgeries.    We discussed the available methods of breast reconstruction and included:  1. Tissue expander with Acellular dermal matrix followed by implant based reconstruction. This can be done as one surgery or multiple surgeries.  2. Autologous reconstruction can include using a muscle or tissue from another area of the body for the  reconstruction.  3. Combined procedures like the latissismus dorsi flaps that often uses the muscle with an expander or implant.  For each of the method discussed the risks, benefits, scars and recovery time were discussed in detail. Specific risks included bleeding, infection, hematoma, seroma, scarring, pain, wound healing complications, flap loss, fat necrosis, capsular contracture, need for implant removal, donor site complications, bulge, hernia, umbilical necrosis, need for urgent reoperation, and need for dressing changes.   After the options were discussed we focused on the patient's desires and the procedure that was best for her based on all the information.  A total of 50 minutes of face-to-face time was spent in this encounter, of which >70% was spent in counseling.  I also spent time reviewing her chart.  Dictating and placed a call to Dr. Marlou Starks to discuss the above information.  Patient is a good candidate for bilateral oncoplastic breast reduction.  Pictures were obtained of the patient and placed in the chart with the patient's or guardian's permission.   Pictures were obtained of the patient and placed in the chart with the patient's or guardian's permission.   [place], DO

## 2020-08-30 ENCOUNTER — Ambulatory Visit: Payer: Self-pay | Admitting: General Surgery

## 2020-08-30 ENCOUNTER — Encounter: Payer: Self-pay | Admitting: *Deleted

## 2020-08-30 DIAGNOSIS — C50411 Malignant neoplasm of upper-outer quadrant of right female breast: Secondary | ICD-10-CM

## 2020-08-30 DIAGNOSIS — Z170 Estrogen receptor positive status [ER+]: Secondary | ICD-10-CM

## 2020-09-09 ENCOUNTER — Encounter: Payer: Self-pay | Admitting: *Deleted

## 2020-09-15 NOTE — Progress Notes (Signed)
Nutrition  Patient identified by attending Breast Clinic on 08/18/2020.  Patient was given nutrition packet with RD contact information by nurse navigator.   59 year old female with right breast cancer.  Planning lumpectomy, oncotype, radiation and aromatase inhibitors.    Chart reviewed  Ht: 62 inches Wt: 186 lb BMI: 33  Patient is currently not at nutritional risk.  Please consult RD if changes in nutritional status occur.  Maudine Kluesner B. Zenia Resides, [place], [place] Registered Dietitian [phone] (mobile)

## 2020-09-17 NOTE — Progress Notes (Addendum)
ICD-10-CM   1. Malignant neoplasm of upper-outer quadrant of right breast in female, estrogen receptor positive [facility])  C50.411    Z17.0       Patient ID: Cassandra Cox, female    DOB: 10/29/1961, 59 y.o.   MRN: [id number]   History of Present Illness: Cassandra Cox is a 59 y.o.  female  with a history of right side breast cancer.  She presents for preoperative evaluation for upcoming procedure, Right breast lumpectomy with radioactive seed and sentinel lymph node biopsy with Dr. Marlou Starks and oncoplastic breast reduction with Dr. Marla Roe, scheduled for 10/04/20.  Summary from previous visit: Patient was recently diagnosed with invasice ductal carcinoma with DCIS. ER/PR+, HER-2 negative. She is 5'2" tall and weighs 186 lbs. Preoperative bra size is 38 DDD and she would like to be smaller. She had a previous breast reduction done at [facility] ~ 20 yrs ago. Appears to be inferior pedicle technique.   Patient is joined today by her daughter over the telephone. She would like to be around a C cup.   Job: Realtor  PMH Significant for: prediabetes, anxiety/depression, thyroid disease, MI (2018) with stent placement, hysterectomy (1998), HLD, CAD, Mitral Valve Prolapse  The patient has not had problems with anesthesia.   Past Medical History: Allergies: Allergies  Allergen Reactions  . Prednisone Other (See Comments)    REACTION: Swelling REACTION: Swelling Facial swelling REACTION: Swelling  . Sulfa Antibiotics Hives and Other (See Comments)    REACTION: Hives  . Sulfasalazine     REACTION: Hives  . Sulfonamide Derivatives     REACTION: Hives    Current Medications:  Current Outpatient Medications:  .  ALPRAZolam (XANAX) 0.25 MG tablet, Take 0.25 mg by mouth at bedtime as needed for anxiety., Disp: , Rfl:  .  ARMOUR THYROID 60 MG tablet, Take 60 mg by mouth daily., Disp: , Rfl:  .  aspirin EC 81 MG tablet, Take 81 mg by mouth daily., Disp: , Rfl:  .  atorvastatin (LIPITOR) 80 MG  tablet, TAKE 1 TABLET BY MOUTH DAILY AT 6PM GENERIC EQUIVALENT FOR LIPITOR, Disp: 90 tablet, Rfl: 3 .  buPROPion (WELLBUTRIN XL) 150 MG 24 hr tablet, Take 300 mg by mouth daily. , Disp: , Rfl:  .  Cholecalciferol (VITAMIN D-3) 5000 UNITS TABS, Take 1 tablet by mouth daily. , Disp: , Rfl:  .  clopidogrel (PLAVIX) 75 MG tablet, TAKE 1 TABLET BY MOUTH DAILY GENERIC EQUIVALENT FOR PLAVIX, Disp: 90 tablet, Rfl: 3 .  Cyanocobalamin 1000 MCG/ML LIQD, Inject 1 mL as directed once a week., Disp: , Rfl:  .  ezetimibe (ZETIA) 10 MG tablet, TAKE 1 TABLET BY MOUTH DAILY., Disp: 90 tablet, Rfl: 3 .  FLUoxetine (PROZAC) 20 MG capsule, Take 20 mg by mouth daily. , Disp: , Rfl:  .  levocetirizine (XYZAL) 5 MG tablet, Take 1 tablet at bedtime by mouth., Disp: , Rfl: 2 .  liothyronine (CYTOMEL) 5 MCG tablet, Take 10 mcg by mouth every morning., Disp: , Rfl:  .  magnesium gluconate (MAGONATE) 500 MG tablet, Take 1,000 mg by mouth at bedtime. , Disp: , Rfl:  .  metFORMIN (GLUCOPHAGE-XR) 500 MG 24 hr tablet, Take 500 mg by mouth 2 (two) times daily., Disp: , Rfl:  .  montelukast (SINGULAIR) 10 MG tablet, Take 10 mg by mouth at bedtime., Disp: , Rfl:  .  NALTREXONE HCL PO, Take 4 mg by mouth at bedtime. , Disp: , Rfl:  .  zolpidem (AMBIEN CR) 12.5 MG CR tablet, Take 12.5 mg by mouth at bedtime as needed for sleep., Disp: , Rfl:  .  nitroGLYCERIN (NITROSTAT) 0.4 MG SL tablet, Place 1 tablet (0.4 mg total) under the tongue every 5 (five) minutes as needed for chest pain. For maximum of 3 doses. (Patient not taking: Reported on 09/20/2020), Disp: 25 tablet, Rfl: 3 .  progesterone (PROMETRIUM) 200 MG capsule, Take 200 mg by mouth at bedtime.  (Patient not taking: Reported on 09/20/2020), Disp: , Rfl: 2  Past Medical Problems: Past Medical History:  Diagnosis Date  . Coronary artery disease    a. 11/2016 Cath/PCI: LM nl, LAD 30ost, 26m(3.0x18 Resolute Integrity DES--3.75), LCX nl, RCA nl; b. MV 11/18: negative for  ischemia, EF 72%, low risk  . Depression   . Diabetes mellitus without complication ([place]   . Diastolic dysfunction    a. 11/2016 Echo: EF 55-60%, Gr2 DD, LAE, nl RV size/fxn.  . Hyperlipemia \\[id number]\ . Kidney stone 08/2016  . MVP (mitral valve prolapse)   . STEMI (ST elevation myocardial infarction) [place] 12/02/2016  . Vitamin B 12 deficiency     Past Surgical History: Past Surgical History:  Procedure Laterality Date  . ABDOMINAL HYSTERECTOMY  2003   TAH due to adenomyosis  . BREAST REDUCTION SURGERY  1990  . CARDIAC CATHETERIZATION N/A 12/02/2016   Procedure: Coronary Stent Intervention;  Surgeon: CNelva Bush MD;  [facility];  Service: Cardiovascular;  Laterality: N/A;  . CARDIAC CATHETERIZATION N/A 12/02/2016   Procedure: Left Heart Cath and Coronary Angiography;  Surgeon: CNelva Bush MD;  [facility];  Service: Cardiovascular;  Laterality: N/A;  . CARDIAC CATHETERIZATION N/A 12/02/2016   Procedure: Intravascular Ultrasound/IVUS;  Surgeon: CNelva Bush MD;  [facility];  Service: Cardiovascular;  Laterality: N/A;  . CORONARY STENT INTERVENTION    . MOUTH SURGERY     Dental implant    Social History: Social History   Socioeconomic History  . Marital status: Divorced    Spouse name: Not on file  . Number of children: Not on file  . Years of education: Not on file  . Highest education level: Not on file  Occupational History    Employer: [employer]  Tobacco Use  . Smoking status: Never Smoker  . Smokeless tobacco: Never Used  Vaping Use  . Vaping Use: Never used  Substance and Sexual Activity  . Alcohol use: No  . Drug use: No  . Sexual activity: Yes    Birth control/protection: Surgical  Other Topics Concern  . Not on file  Social History Narrative  . Not on file   Social Determinants of Health   Financial Resource Strain:   . Difficulty of Paying Living Expenses: Not on  file  Food Insecurity:   . Worried About RCharity fundraiserin the Last Year: Not on file  . Ran Out of Food in the Last Year: Not on file  Transportation Needs:   . Lack of Transportation (Medical): Not on file  . Lack of Transportation (Non-Medical): Not on file  Physical Activity:   . Days of Exercise per Week: Not on file  . Minutes of Exercise per Session: Not on file  Stress:   . Feeling of Stress : Not on file  Social Connections:   . Frequency of Communication with Friends and Family: Not on file  . Frequency of Social Gatherings with Friends and Family:  Not on file  . Attends Religious Services: Not on file  . Active Member of Clubs or Organizations: Not on file  . Attends Archivist Meetings: Not on file  . Marital Status: Not on file  Intimate Partner Violence:   . Fear of Current or Ex-Partner: Not on file  . Emotionally Abused: Not on file  . Physically Abused: Not on file  . Sexually Abused: Not on file    Family History: Family History  Problem Relation Age of Onset  . Heart disease Mother   . Diabetes Mother   . Stroke Mother   . Heart attack Mother   . Atrial fibrillation Mother   . Hypertension Mother   . Hyperlipidemia Mother   . COPD Father   . Hypertension Father   . Hyperlipidemia Father   . Breast cancer Maternal Aunt 98       x2 age 79  . Diabetes Maternal Grandfather     Review of Systems: Review of Systems  Constitutional: Negative for chills and fever.  HENT: Negative for congestion and sore throat.   Respiratory: Negative for cough and shortness of breath.   Cardiovascular: Negative for chest pain.  Gastrointestinal: Negative for abdominal pain, nausea and vomiting.  Skin: Negative for itching and rash.    Physical Exam: Vital Signs BP 132/81 (BP Location: Left Arm, Patient Position: Sitting, Cuff Size: Large)   Pulse 94   Temp 98.5 F (36.9 C) (Oral)   Ht _0  (1.6 m)   Wt 182 lb 9.6 oz (82.8 kg)   SpO2 99%   BMI  32.35 kg/m  Physical Exam Vitals and nursing note reviewed.  Constitutional:      Appearance: Normal appearance. She is normal weight.  HENT:     Head: Normocephalic and atraumatic.  Eyes:     Extraocular Movements: Extraocular movements intact.  Cardiovascular:     Rate and Rhythm: Normal rate and regular rhythm.     Pulses: Normal pulses.     Heart sounds: Normal heart sounds.  Pulmonary:     Effort: Pulmonary effort is normal.     Breath sounds: Normal breath sounds. No wheezing, rhonchi or rales.  Chest:     Comments: sm amount of yeast looking rash under bilateral breasts Abdominal:     General: Bowel sounds are normal.     Palpations: Abdomen is soft.  Musculoskeletal:        General: No swelling. Normal range of motion.     Cervical back: Normal range of motion.  Skin:    General: Skin is warm and dry.     Coloration: Skin is not pale.     Findings: No erythema or rash.  Neurological:     General: No focal deficit present.     Mental Status: She is alert and oriented to person, place, and time.  Psychiatric:        Mood and Affect: Mood normal.        Behavior: Behavior normal.        Thought Content: Thought content normal.        Judgment: Judgment normal.     Assessment/Plan:  Cassandra Cox scheduled for Right breast lumpectomy with radioactive seed and sentinel lymph node biopsy with Dr. Marlou Starks and oncoplastic breast reduction with Dr. Marla Roe.  Risks, benefits, and alternatives of procedure discussed, questions answered and consent obtained.    Surgical Clearance Request sent to Cardiology, Dr. Saunders Revel, including request to hold Plavix and ASA.  Smoking Status: non-smoker; Counseling Given? N/A Last Mammogram:06/30/20; Results: 1.2 cm mass in right breast  Caprini Score: High; Risk Factors include: 59 yr-old female, breast cancer, BMI > 25, and length of planned surgery. Recommendation for mechanical and pharmacological prophylaxis during surgery. Encourage early  ambulation.   Pictures obtained: 08/27/20  Post-op Rx sent to pharmacy: Zofran, Keflex, Ketoconazole  Patient was provided with the breast reduction risks and General Surgical Risk consent document and Pain Medication Agreement prior to their appointment.  They had adequate time to read through the risk consent documents and Pain Medication Agreement. We also discussed them in person together during this preop appointment. All of their questions were answered to their satisfaction.  Recommended calling if they have any further questions.  Risk consent form and Pain Medication Agreement to be scanned into patient's chart.  The risk that can be encountered with breast reduction were discussed and include the following but not limited to these:  Breast asymmetry, fluid accumulation, firmness of the breast, inability to breast feed, loss of nipple or areola, skin loss, decrease or no nipple sensation, fat necrosis of the breast tissue, bleeding, infection, healing delay.  There are risks of anesthesia, changes to skin sensation and injury to nerves or blood vessels.  The muscle can be temporarily or permanently injured.  You may have an allergic reaction to tape, suture, glue, blood products which can result in skin discoloration, swelling, pain, skin lesions, poor healing.  Any of these can lead to the need for revisonal surgery or stage procedures.  A reduction has potential to interfere with diagnostic procedures.  Nipple or breast piercing can increase risks of infection.  This procedure is best done when the breast is fully developed.  Changes in the breast will continue to occur over time.  Pregnancy can alter the outcomes of previous breast reduction surgery, weight gain and weigh loss can also effect the long term appearance.   Electronically signed by: Threasa Heads, PA-C 09/20/2020 5:03 PM

## 2020-09-20 ENCOUNTER — Other Ambulatory Visit: Payer: Self-pay

## 2020-09-20 ENCOUNTER — Encounter: Payer: Self-pay | Admitting: Plastic Surgery

## 2020-09-20 ENCOUNTER — Ambulatory Visit (INDEPENDENT_AMBULATORY_CARE_PROVIDER_SITE_OTHER): Payer: 59 | Admitting: Plastic Surgery

## 2020-09-20 VITALS — BP 132/81 | HR 94 | Temp 98.5°F | Ht 63.0 in | Wt 182.6 lb

## 2020-09-20 DIAGNOSIS — Z170 Estrogen receptor positive status [ER+]: Secondary | ICD-10-CM

## 2020-09-20 DIAGNOSIS — C50411 Malignant neoplasm of upper-outer quadrant of right female breast: Secondary | ICD-10-CM

## 2020-09-20 MED ORDER — CEPHALEXIN 500 MG PO CAPS: 500.0000 mg | ORAL_CAPSULE | Freq: Four times a day (QID) | ORAL | 0 refills | Status: AC

## 2020-09-20 MED ORDER — ONDANSETRON HCL 4 MG PO TABS: 4.0000 mg | ORAL_TABLET | Freq: Three times a day (TID) | ORAL | 0 refills | Status: DC | PRN

## 2020-09-20 MED ORDER — KETOCONAZOLE 2 % EX CREA: 1.0000 "application " | TOPICAL_CREAM | Freq: Every day | CUTANEOUS | 0 refills | Status: DC

## 2020-09-22 ENCOUNTER — Encounter (HOSPITAL_BASED_OUTPATIENT_CLINIC_OR_DEPARTMENT_OTHER): Payer: Self-pay | Admitting: General Surgery

## 2020-09-22 ENCOUNTER — Other Ambulatory Visit: Payer: Self-pay

## 2020-09-24 ENCOUNTER — Telehealth: Payer: Self-pay

## 2020-09-24 NOTE — Telephone Encounter (Signed)
Patient called to say that her doctor at [facility] in [name] has her on a medication that will interfere with the pain medicine that she may need to take after surgery.  Her doctor is faxing a letter about it to our office.

## 2020-09-27 ENCOUNTER — Encounter: Payer: Self-pay | Admitting: Plastic Surgery

## 2020-09-27 ENCOUNTER — Other Ambulatory Visit: Payer: Self-pay | Admitting: Plastic Surgery

## 2020-09-27 MED ORDER — HYDROCODONE-ACETAMINOPHEN 5-325 MG PO TABS: 1.0000 | ORAL_TABLET | Freq: Three times a day (TID) | ORAL | 0 refills | Status: AC | PRN

## 2020-09-27 NOTE — Telephone Encounter (Signed)
Called patient to let her know document was giving to Dr. Marla Roe for review. Please call patient to advise if something changes with medication that has been prescribed.

## 2020-09-27 NOTE — H&P (View-Only) (Signed)
Per Erven Colla, PA-C at [facility] [place]... Patient is taking Naltrexone for her autoimmune condition, hashimoto's thyroiditis. There is no contraindication to taking a narcotic at the dosage she is taking. Out of an abundance of caution, she may stop 72 hours prior to surgery. (Friday Nov 26)

## 2020-09-27 NOTE — Progress Notes (Signed)
Per Erven Colla, PA-C at [facility] [place]... Patient is taking Naltrexone for her autoimmune condition, hashimoto's thyroiditis. There is no contraindication to taking a narcotic at the dosage she is taking. Out of an abundance of caution, she may stop 72 hours prior to surgery. (Friday Nov 26)

## 2020-09-28 ENCOUNTER — Encounter (HOSPITAL_BASED_OUTPATIENT_CLINIC_OR_DEPARTMENT_OTHER)
Admission: RE | Admit: 2020-09-28 | Discharge: 2020-09-28 | Disposition: A | Discharge status: Home / Self Care | Payer: 59 | Source: Ambulatory Visit | Attending: General Surgery | Admitting: General Surgery

## 2020-09-28 DIAGNOSIS — Z01812 Encounter for preprocedural laboratory examination: Secondary | ICD-10-CM | POA: Insufficient documentation

## 2020-09-28 LAB — BASIC METABOLIC PANEL
Anion gap: 10 (ref 5–15)
BUN: 14 mg/dL (ref 6–20)
CO2: 26 mmol/L (ref 22–32)
Calcium: 9.7 mg/dL (ref 8.9–10.3)
Chloride: 103 mmol/L (ref 98–111)
Creatinine, Ser: 0.84 mg/dL (ref 0.44–1.00)
GFR, Estimated: >60 mL/min (ref >60)
Glucose, Bld: 159 mg/dL — ABNORMAL HIGH (ref 70–99)
Potassium: 4.3 mmol/L (ref 3.5–5.1)
Sodium: 139 mmol/L (ref 135–145)

## 2020-09-28 NOTE — Progress Notes (Signed)
      Enhanced Recovery after Surgery for Orthopedics Enhanced Recovery after Surgery is a protocol used to improve the stress on your body and your recovery after surgery.  Patient Instructions  . The night before surgery:  o No food after midnight. ONLY clear liquids after midnight  . The day of surgery (if you do NOT have diabetes):  o Drink ONE (1) Pre-Surgery Clear Ensure as directed.   o This drink was given to you during your hospital  pre-op appointment visit. o The pre-op nurse will instruct you on the time to drink the  Pre-Surgery Ensure depending on your surgery time. o Finish the drink at the designated time by the pre-op nurse.  o Nothing else to drink after completing the  Pre-Surgery Clear Ensure.  . The day of surgery (if you have diabetes): o Drink ONE (1) Gatorade 2 (G2) as directed. o This drink was given to you during your hospital  pre-op appointment visit.  o The pre-op nurse will instruct you on the time to drink the   Gatorade 2 (G2) depending on your surgery time. o Color of the Gatorade may vary. Red is not allowed. o Nothing else to drink after completing the  Gatorade 2 (G2).         If you have questions, please contact your surgeon's office.  Surgical soap and instructions given. Patient verbalized understanding.

## 2020-09-28 NOTE — Telephone Encounter (Addendum)
Called patient on (09/27/20) and informed her of the message below.  Also informed the pt that [facility] stated that she can call the doctor to see when to restart the medication.  Patient verbalized understanding and agreed.//AB/CMA

## 2020-09-28 NOTE — Telephone Encounter (Signed)
-----   Message from Threasa Heads, [place] sent at 09/27/2020  4:34 PM EST ----- Regarding: Please notify to stop Natrexone on Friday Nov 26 Angie-  Would you all Evangelina and let her know that Ms. Godly her PA said it was ok to stop her Naltrexone on Friday Nov 26 (72 hours prior to surgery).  Also let her know that I sent a Rx for Hydrocodone for her to the [facility] in [place].  Thanks!

## 2020-10-01 ENCOUNTER — Other Ambulatory Visit (HOSPITAL_COMMUNITY)
Admission: RE | Admit: 2020-10-01 | Discharge: 2020-10-01 | Disposition: A | Discharge status: Home / Self Care | Payer: 59 | Source: Ambulatory Visit | Attending: General Surgery | Admitting: General Surgery

## 2020-10-01 DIAGNOSIS — Z20822 Contact with and (suspected) exposure to covid-19: Secondary | ICD-10-CM | POA: Insufficient documentation

## 2020-10-01 DIAGNOSIS — Z01812 Encounter for preprocedural laboratory examination: Secondary | ICD-10-CM | POA: Diagnosis present

## 2020-10-01 LAB — SARS CORONAVIRUS 2 (TAT 6-24 HRS): SARS Coronavirus 2: NEGATIVE

## 2020-10-04 ENCOUNTER — Ambulatory Visit (HOSPITAL_BASED_OUTPATIENT_CLINIC_OR_DEPARTMENT_OTHER): Payer: 59 | Admitting: Certified Registered"

## 2020-10-04 ENCOUNTER — Ambulatory Visit (HOSPITAL_COMMUNITY)
Admission: RE | Admit: 2020-10-04 | Discharge: 2020-10-04 | Disposition: A | Discharge status: Home / Self Care | Payer: 59 | Source: Ambulatory Visit | Attending: General Surgery | Admitting: General Surgery

## 2020-10-04 ENCOUNTER — Encounter (HOSPITAL_BASED_OUTPATIENT_CLINIC_OR_DEPARTMENT_OTHER): Payer: Self-pay | Admitting: General Surgery

## 2020-10-04 ENCOUNTER — Ambulatory Visit (HOSPITAL_BASED_OUTPATIENT_CLINIC_OR_DEPARTMENT_OTHER): Admit: 2020-10-04 | Payer: 59 | Admitting: General Surgery

## 2020-10-04 ENCOUNTER — Encounter (HOSPITAL_BASED_OUTPATIENT_CLINIC_OR_DEPARTMENT_OTHER)
Admission: RE | Disposition: A | Discharge status: Home / Self Care | Payer: Self-pay | Source: Home / Self Care | Attending: General Surgery

## 2020-10-04 ENCOUNTER — Other Ambulatory Visit: Payer: Self-pay

## 2020-10-04 ENCOUNTER — Encounter (HOSPITAL_BASED_OUTPATIENT_CLINIC_OR_DEPARTMENT_OTHER): Payer: Self-pay

## 2020-10-04 ENCOUNTER — Ambulatory Visit (HOSPITAL_BASED_OUTPATIENT_CLINIC_OR_DEPARTMENT_OTHER)
Admission: RE | Admit: 2020-10-04 | Discharge: 2020-10-04 | Disposition: A | Discharge status: Home / Self Care | Payer: 59 | Attending: General Surgery | Admitting: General Surgery

## 2020-10-04 DIAGNOSIS — M542 Cervicalgia: Secondary | ICD-10-CM

## 2020-10-04 DIAGNOSIS — Z9889 Other specified postprocedural states: Secondary | ICD-10-CM | POA: Diagnosis not present

## 2020-10-04 DIAGNOSIS — C50111 Malignant neoplasm of central portion of right female breast: Secondary | ICD-10-CM | POA: Diagnosis present

## 2020-10-04 DIAGNOSIS — Z170 Estrogen receptor positive status [ER+]: Secondary | ICD-10-CM | POA: Diagnosis not present

## 2020-10-04 DIAGNOSIS — C50411 Malignant neoplasm of upper-outer quadrant of right female breast: Secondary | ICD-10-CM

## 2020-10-04 DIAGNOSIS — Z955 Presence of coronary angioplasty implant and graft: Secondary | ICD-10-CM | POA: Diagnosis not present

## 2020-10-04 DIAGNOSIS — E063 Autoimmune thyroiditis: Secondary | ICD-10-CM | POA: Diagnosis not present

## 2020-10-04 DIAGNOSIS — I252 Old myocardial infarction: Secondary | ICD-10-CM | POA: Insufficient documentation

## 2020-10-04 DIAGNOSIS — N6489 Other specified disorders of breast: Secondary | ICD-10-CM | POA: Diagnosis not present

## 2020-10-04 DIAGNOSIS — Z803 Family history of malignant neoplasm of breast: Secondary | ICD-10-CM | POA: Diagnosis not present

## 2020-10-04 DIAGNOSIS — M545 Low back pain, unspecified: Secondary | ICD-10-CM

## 2020-10-04 DIAGNOSIS — N62 Hypertrophy of breast: Secondary | ICD-10-CM | POA: Insufficient documentation

## 2020-10-04 DIAGNOSIS — Z7902 Long term (current) use of antithrombotics/antiplatelets: Secondary | ICD-10-CM | POA: Insufficient documentation

## 2020-10-04 DIAGNOSIS — Z7984 Long term (current) use of oral hypoglycemic drugs: Secondary | ICD-10-CM | POA: Diagnosis not present

## 2020-10-04 DIAGNOSIS — Z853 Personal history of malignant neoplasm of breast: Secondary | ICD-10-CM

## 2020-10-04 DIAGNOSIS — M954 Acquired deformity of chest and rib: Secondary | ICD-10-CM

## 2020-10-04 HISTORY — PX: BREAST LUMPECTOMY WITH RADIOACTIVE SEED AND SENTINEL LYMPH NODE BIOPSY: SHX6550

## 2020-10-04 HISTORY — DX: Nausea with vomiting, unspecified: R11.2

## 2020-10-04 HISTORY — PX: BREAST REDUCTION SURGERY: SHX8

## 2020-10-04 HISTORY — DX: Other specified postprocedural states: Z98.890

## 2020-10-04 HISTORY — DX: Other complications of anesthesia, initial encounter: T88.59XA

## 2020-10-04 LAB — GLUCOSE, CAPILLARY
Glucose-Capillary: 86 mg/dL (ref 70–99)
Glucose-Capillary: 127 mg/dL — ABNORMAL HIGH (ref 70–99)

## 2020-10-04 SURGERY — BREAST LUMPECTOMY WITH RADIOACTIVE SEED AND SENTINEL LYMPH NODE BIOPSY
Anesthesia: General | Site: Breast | Laterality: Right

## 2020-10-04 MED ORDER — HYDROMORPHONE HCL 1 MG/ML IJ SOLN: 0.2500 mg | INTRAMUSCULAR | Status: DC | PRN

## 2020-10-04 MED ORDER — ARTIFICIAL TEARS OPHTHALMIC OINT
TOPICAL_OINTMENT | OPHTHALMIC | Status: AC
  Filled 2020-10-04: qty 3.5

## 2020-10-04 MED ORDER — PHENYLEPHRINE 40 MCG/ML (10ML) SYRINGE FOR IV PUSH (FOR BLOOD PRESSURE SUPPORT)
PREFILLED_SYRINGE | INTRAVENOUS | Status: DC | PRN
  Administered 2020-10-04 (×4): 80 ug via INTRAVENOUS

## 2020-10-04 MED ORDER — PHENYLEPHRINE HCL (PRESSORS) 10 MG/ML IV SOLN
INTRAVENOUS | Status: AC
  Filled 2020-10-04: qty 1

## 2020-10-04 MED ORDER — FENTANYL CITRATE (PF) 100 MCG/2ML IJ SOLN
100.0000 ug | Freq: Once | INTRAMUSCULAR | Status: AC
  Administered 2020-10-04: 100 ug via INTRAVENOUS

## 2020-10-04 MED ORDER — PHENYLEPHRINE 40 MCG/ML (10ML) SYRINGE FOR IV PUSH (FOR BLOOD PRESSURE SUPPORT)
PREFILLED_SYRINGE | INTRAVENOUS | Status: AC
  Filled 2020-10-04: qty 10

## 2020-10-04 MED ORDER — SODIUM CHLORIDE 0.9 % IV SOLN: 250.0000 mL | INTRAVENOUS | Status: DC | PRN

## 2020-10-04 MED ORDER — NITROGLYCERIN 2 % TD OINT
TOPICAL_OINTMENT | TRANSDERMAL | Status: DC | PRN
  Administered 2020-10-04: 1 [in_us] via TOPICAL

## 2020-10-04 MED ORDER — CHLORHEXIDINE GLUCONATE CLOTH 2 % EX PADS: 6.0000 | MEDICATED_PAD | Freq: Once | CUTANEOUS | Status: DC

## 2020-10-04 MED ORDER — OXYCODONE HCL 5 MG/5ML PO SOLN: 5.0000 mg | Freq: Once | ORAL | Status: AC | PRN

## 2020-10-04 MED ORDER — ACETAMINOPHEN 500 MG PO TABS
1000.0000 mg | ORAL_TABLET | ORAL | Status: AC
  Administered 2020-10-04: 1000 mg via ORAL

## 2020-10-04 MED ORDER — ACETAMINOPHEN 500 MG PO TABS
ORAL_TABLET | ORAL | Status: AC
  Filled 2020-10-04: qty 2

## 2020-10-04 MED ORDER — ROCURONIUM BROMIDE 10 MG/ML (PF) SYRINGE
PREFILLED_SYRINGE | INTRAVENOUS | Status: DC | PRN
  Administered 2020-10-04: 80 mg via INTRAVENOUS

## 2020-10-04 MED ORDER — NITROGLYCERIN 2 % TD OINT
TOPICAL_OINTMENT | TRANSDERMAL | Status: AC
  Filled 2020-10-04: qty 30

## 2020-10-04 MED ORDER — PROPOFOL 10 MG/ML IV BOLUS
INTRAVENOUS | Status: DC | PRN
  Administered 2020-10-04: 140 mg via INTRAVENOUS

## 2020-10-04 MED ORDER — ROPIVACAINE HCL 5 MG/ML IJ SOLN
INTRAMUSCULAR | Status: DC | PRN
  Administered 2020-10-04: 30 mL via PERINEURAL

## 2020-10-04 MED ORDER — MIDAZOLAM HCL 2 MG/2ML IJ SOLN
INTRAMUSCULAR | Status: AC
  Filled 2020-10-04: qty 2

## 2020-10-04 MED ORDER — GABAPENTIN 300 MG PO CAPS
ORAL_CAPSULE | ORAL | Status: AC
  Filled 2020-10-04: qty 1

## 2020-10-04 MED ORDER — OXYCODONE HCL 5 MG PO TABS
5.0000 mg | ORAL_TABLET | Freq: Once | ORAL | Status: AC | PRN
  Administered 2020-10-04: 5 mg via ORAL

## 2020-10-04 MED ORDER — ONDANSETRON HCL 4 MG/2ML IJ SOLN
INTRAMUSCULAR | Status: DC | PRN
  Administered 2020-10-04: 4 mg via INTRAVENOUS

## 2020-10-04 MED ORDER — CEFAZOLIN SODIUM-DEXTROSE 2-4 GM/100ML-% IV SOLN: 2.0000 g | INTRAVENOUS | Status: DC

## 2020-10-04 MED ORDER — LIDOCAINE 2% (20 MG/ML) 5 ML SYRINGE
INTRAMUSCULAR | Status: AC
  Filled 2020-10-04: qty 5

## 2020-10-04 MED ORDER — MIDAZOLAM HCL 2 MG/2ML IJ SOLN
2.0000 mg | Freq: Once | INTRAMUSCULAR | Status: AC
  Administered 2020-10-04: 2 mg via INTRAVENOUS

## 2020-10-04 MED ORDER — OXYCODONE HCL 5 MG PO TABS: 5.0000 mg | ORAL_TABLET | ORAL | Status: DC | PRN

## 2020-10-04 MED ORDER — SODIUM CHLORIDE 0.9% FLUSH: 3.0000 mL | INTRAVENOUS | Status: DC | PRN

## 2020-10-04 MED ORDER — FENTANYL CITRATE (PF) 100 MCG/2ML IJ SOLN
INTRAMUSCULAR | Status: AC
  Filled 2020-10-04: qty 2

## 2020-10-04 MED ORDER — TECHNETIUM TC 99M TILMANOCEPT KIT
1.0000 | PACK | Freq: Once | INTRAVENOUS | Status: AC | PRN
  Administered 2020-10-04: 1 via INTRADERMAL

## 2020-10-04 MED ORDER — LACTATED RINGERS IV SOLN: INTRAVENOUS | Status: DC

## 2020-10-04 MED ORDER — CEFAZOLIN SODIUM-DEXTROSE 2-4 GM/100ML-% IV SOLN
2.0000 g | INTRAVENOUS | Status: AC
  Administered 2020-10-04: 2 g via INTRAVENOUS

## 2020-10-04 MED ORDER — GABAPENTIN 300 MG PO CAPS
300.0000 mg | ORAL_CAPSULE | ORAL | Status: AC
  Administered 2020-10-04: 300 mg via ORAL

## 2020-10-04 MED ORDER — ROCURONIUM BROMIDE 10 MG/ML (PF) SYRINGE
PREFILLED_SYRINGE | INTRAVENOUS | Status: AC
  Filled 2020-10-04: qty 10

## 2020-10-04 MED ORDER — LIDOCAINE 2% (20 MG/ML) 5 ML SYRINGE
INTRAMUSCULAR | Status: DC | PRN
  Administered 2020-10-04: 80 mg via INTRAVENOUS
  Administered 2020-10-04: 10 mg via INTRAVENOUS

## 2020-10-04 MED ORDER — PROMETHAZINE HCL 25 MG/ML IJ SOLN: 6.2500 mg | INTRAMUSCULAR | Status: DC | PRN

## 2020-10-04 MED ORDER — SUGAMMADEX SODIUM 500 MG/5ML IV SOLN
INTRAVENOUS | Status: AC
  Filled 2020-10-04: qty 5

## 2020-10-04 MED ORDER — PHENYLEPHRINE HCL-NACL 10-0.9 MG/250ML-% IV SOLN
INTRAVENOUS | Status: DC | PRN
  Administered 2020-10-04: 30 ug/min via INTRAVENOUS

## 2020-10-04 MED ORDER — SUGAMMADEX SODIUM 200 MG/2ML IV SOLN
INTRAVENOUS | Status: DC | PRN
  Administered 2020-10-04: 200 mg via INTRAVENOUS

## 2020-10-04 MED ORDER — MEPERIDINE HCL 25 MG/ML IJ SOLN: 6.2500 mg | INTRAMUSCULAR | Status: DC | PRN

## 2020-10-04 MED ORDER — ACETAMINOPHEN 325 MG RE SUPP: 650.0000 mg | RECTAL | Status: DC | PRN

## 2020-10-04 MED ORDER — CEFAZOLIN SODIUM-DEXTROSE 2-4 GM/100ML-% IV SOLN
INTRAVENOUS | Status: AC
  Filled 2020-10-04: qty 100

## 2020-10-04 MED ORDER — SODIUM CHLORIDE 0.9% FLUSH: 3.0000 mL | Freq: Two times a day (BID) | INTRAVENOUS | Status: DC

## 2020-10-04 MED ORDER — DROPERIDOL 2.5 MG/ML IJ SOLN
INTRAMUSCULAR | Status: AC
  Filled 2020-10-04: qty 2

## 2020-10-04 MED ORDER — LIDOCAINE-EPINEPHRINE 1 %-1:100000 IJ SOLN
INTRAMUSCULAR | Status: DC | PRN
  Administered 2020-10-04: 20 mL

## 2020-10-04 MED ORDER — DROPERIDOL 2.5 MG/ML IJ SOLN
INTRAMUSCULAR | Status: DC | PRN
  Administered 2020-10-04: .625 mg via INTRAVENOUS

## 2020-10-04 MED ORDER — DEXAMETHASONE SODIUM PHOSPHATE 10 MG/ML IJ SOLN
INTRAMUSCULAR | Status: DC | PRN
  Administered 2020-10-04: 10 mg via INTRAVENOUS

## 2020-10-04 MED ORDER — FENTANYL CITRATE (PF) 100 MCG/2ML IJ SOLN: 25.0000 ug | INTRAMUSCULAR | Status: DC | PRN

## 2020-10-04 MED ORDER — OXYCODONE HCL 5 MG PO TABS
ORAL_TABLET | ORAL | Status: AC
  Filled 2020-10-04: qty 1

## 2020-10-04 MED ORDER — ACETAMINOPHEN 325 MG PO TABS: 650.0000 mg | ORAL_TABLET | ORAL | Status: DC | PRN

## 2020-10-04 MED ORDER — FENTANYL CITRATE (PF) 250 MCG/5ML IJ SOLN
INTRAMUSCULAR | Status: DC | PRN
  Administered 2020-10-04: 25 ug via INTRAVENOUS
  Administered 2020-10-04: 50 ug via INTRAVENOUS

## 2020-10-04 MED ORDER — DEXAMETHASONE SODIUM PHOSPHATE 10 MG/ML IJ SOLN
INTRAMUSCULAR | Status: AC
  Filled 2020-10-04: qty 2

## 2020-10-04 SURGICAL SUPPLY — 91 items
ADH SKN CLS APL DERMABOND .7 (GAUZE/BANDAGES/DRESSINGS) ×4
APL PRP STRL LF DISP 70% ISPRP (MISCELLANEOUS) ×4
APPLIER CLIP 9.375 MED OPEN (MISCELLANEOUS) ×3
APR CLP MED 9.3 20 MLT OPN (MISCELLANEOUS) ×2
BAG DECANTER FOR FLEXI CONT (MISCELLANEOUS) ×2 IMPLANT
BINDER BREAST LRG (GAUZE/BANDAGES/DRESSINGS) IMPLANT
BINDER BREAST MEDIUM (GAUZE/BANDAGES/DRESSINGS) IMPLANT
BINDER BREAST XLRG (GAUZE/BANDAGES/DRESSINGS) IMPLANT
BINDER BREAST XXLRG (GAUZE/BANDAGES/DRESSINGS) IMPLANT
BIOPATCH RED 1 DISK 7.0 (GAUZE/BANDAGES/DRESSINGS) IMPLANT
BLADE HEX COATED 2.75 (ELECTRODE) ×2 IMPLANT
BLADE KNIFE PERSONA 10 (BLADE) ×6 IMPLANT
BLADE SURG 15 STRL LF DISP TIS (BLADE) ×2 IMPLANT
BLADE SURG 15 STRL SS (BLADE) ×3
CANISTER SUC SOCK COL 7IN (MISCELLANEOUS) IMPLANT
CANISTER SUCT 1200ML W/VALVE (MISCELLANEOUS) ×3 IMPLANT
CHLORAPREP W/TINT 26 (MISCELLANEOUS) ×6 IMPLANT
CLIP APPLIE 9.375 MED OPEN (MISCELLANEOUS) ×2 IMPLANT
COVER BACK TABLE 60X90IN (DRAPES) ×3 IMPLANT
COVER MAYO STAND STRL (DRAPES) ×3 IMPLANT
COVER PROBE W GEL 5X96 (DRAPES) ×3 IMPLANT
COVER SURGICAL LIGHT HANDLE (MISCELLANEOUS) ×1 IMPLANT
COVER WAND RF STERILE (DRAPES) IMPLANT
DECANTER SPIKE VIAL GLASS SM (MISCELLANEOUS) IMPLANT
DERMABOND ADVANCED (GAUZE/BANDAGES/DRESSINGS) ×2
DERMABOND ADVANCED .7 DNX12 (GAUZE/BANDAGES/DRESSINGS) ×4 IMPLANT
DRAIN CHANNEL 19F RND (DRAIN) IMPLANT
DRAPE LAPAROSCOPIC ABDOMINAL (DRAPES) ×3 IMPLANT
DRAPE UTILITY XL STRL (DRAPES) ×3 IMPLANT
DRSG MEPILEX BORDER 4X4 (GAUZE/BANDAGES/DRESSINGS) ×1 IMPLANT
DRSG OPSITE POSTOP 4X6 (GAUZE/BANDAGES/DRESSINGS) ×4 IMPLANT
DRSG PAD ABDOMINAL 8X10 ST (GAUZE/BANDAGES/DRESSINGS) ×6 IMPLANT
ELECT BLADE 4.0 EZ CLEAN MEGAD (MISCELLANEOUS) ×3
ELECT COATED BLADE 2.86 ST (ELECTRODE) ×3 IMPLANT
ELECT REM PT RETURN 9FT ADLT (ELECTROSURGICAL) ×3
ELECTRODE BLDE 4.0 EZ CLN MEGD (MISCELLANEOUS) IMPLANT
ELECTRODE REM PT RTRN 9FT ADLT (ELECTROSURGICAL) ×2 IMPLANT
EVACUATOR SILICONE 100CC (DRAIN) IMPLANT
GAUZE SPONGE 4X4 12PLY STRL LF (GAUZE/BANDAGES/DRESSINGS) IMPLANT
GLOVE BIO SURGEON STRL SZ 6.5 (GLOVE) ×9 IMPLANT
GLOVE BIO SURGEON STRL SZ7.5 (GLOVE) ×4 IMPLANT
GLOVE BIOGEL M STRL SZ7.5 (GLOVE) ×3 IMPLANT
GLOVE ECLIPSE 6.5 STRL STRAW (GLOVE) ×1 IMPLANT
GLOVE SURG UNDER POLY LF SZ7 (GLOVE) ×1 IMPLANT
GOWN STRL REUS W/ TWL LRG LVL3 (GOWN DISPOSABLE) ×6 IMPLANT
GOWN STRL REUS W/TWL LRG LVL3 (GOWN DISPOSABLE) ×15
ILLUMINATOR WAVEGUIDE N/F (MISCELLANEOUS) IMPLANT
KIT MARKER MARGIN INK (KITS) ×3 IMPLANT
LIGHT WAVEGUIDE WIDE FLAT (MISCELLANEOUS) IMPLANT
NDL FILTER BLUNT 18X1 1/2 (NEEDLE) ×2 IMPLANT
NDL HYPO 25X1 1.5 SAFETY (NEEDLE) ×2 IMPLANT
NDL SAFETY ECLIPSE 18X1.5 (NEEDLE) IMPLANT
NDL SPNL 18GX3.5 QUINCKE PK (NEEDLE) IMPLANT
NEEDLE FILTER BLUNT 18X 1/2SAF (NEEDLE)
NEEDLE FILTER BLUNT 18X1 1/2 (NEEDLE) IMPLANT
NEEDLE HYPO 18GX1.5 SHARP (NEEDLE)
NEEDLE HYPO 25X1 1.5 SAFETY (NEEDLE) ×3 IMPLANT
NEEDLE SPNL 18GX3.5 QUINCKE PK (NEEDLE) IMPLANT
NS IRRIG 1000ML POUR BTL (IV SOLUTION) ×1 IMPLANT
PACK BASIN DAY SURGERY FS (CUSTOM PROCEDURE TRAY) ×3 IMPLANT
PAD ALCOHOL SWAB (MISCELLANEOUS) IMPLANT
PAD FOAM SILICONE BACKED (GAUZE/BANDAGES/DRESSINGS) IMPLANT
PENCIL SMOKE EVACUATOR (MISCELLANEOUS) ×3 IMPLANT
PIN SAFETY STERILE (MISCELLANEOUS) IMPLANT
SLEEVE SCD COMPRESS KNEE MED (MISCELLANEOUS) ×3 IMPLANT
SPONGE LAP 18X18 RF (DISPOSABLE) ×7 IMPLANT
STRIP SUTURE WOUND CLOSURE 1/2 (MISCELLANEOUS) ×6 IMPLANT
SUT MNCRL AB 4-0 PS2 18 (SUTURE) ×16 IMPLANT
SUT MON AB 3-0 SH 27 (SUTURE) ×12
SUT MON AB 3-0 SH27 (SUTURE) ×8 IMPLANT
SUT MON AB 4-0 PC3 18 (SUTURE) ×4 IMPLANT
SUT MON AB 5-0 PS2 18 (SUTURE) ×6 IMPLANT
SUT PDS 3-0 CT2 (SUTURE) ×9
SUT PDS AB 2-0 CT2 27 (SUTURE) IMPLANT
SUT PDS II 3-0 CT2 27 ABS (SUTURE) IMPLANT
SUT SILK 2 0 SH (SUTURE) IMPLANT
SUT SILK 3 0 PS 1 (SUTURE) IMPLANT
SUT VICRYL 3-0 CR8 SH (SUTURE) ×3 IMPLANT
SYR 3ML 23GX1 SAFETY (SYRINGE) IMPLANT
SYR 50ML LL SCALE MARK (SYRINGE) IMPLANT
SYR BULB IRRIG 60ML STRL (SYRINGE) ×3 IMPLANT
SYR CONTROL 10ML LL (SYRINGE) ×3 IMPLANT
TAPE MEASURE VINYL STERILE (MISCELLANEOUS) IMPLANT
TOWEL GREEN STERILE FF (TOWEL DISPOSABLE) ×6 IMPLANT
TRAY DSU PREP LF (CUSTOM PROCEDURE TRAY) ×2 IMPLANT
TRAY FAXITRON CT DISP (TRAY / TRAY PROCEDURE) ×3 IMPLANT
TUBE CONNECTING 20X1/4 (TUBING) ×3 IMPLANT
TUBING INFILTRATION IT-10001 (TUBING) IMPLANT
TUBING SET GRADUATE ASPIR 12FT (MISCELLANEOUS) IMPLANT
UNDERPAD 30X36 HEAVY ABSORB (UNDERPADS AND DIAPERS) ×6 IMPLANT
YANKAUER SUCT BULB TIP NO VENT (SUCTIONS) ×3 IMPLANT

## 2020-10-04 NOTE — Discharge Instructions (Addendum)
INSTRUCTIONS FOR AFTER SURGERY   You will likely have some questions about what to expect following your operation.  The following information will help you and your family understand what to expect when you are discharged from the hospital.  Following these guidelines will help ensure a smooth recovery and reduce risks of complications.  Postoperative instructions include information on: diet, wound care, medications and physical activity.  AFTER SURGERY Expect to go home after the procedure.  In some cases, you may need to spend one night in the hospital for observation.  DIET This surgery does not require a specific diet.  However, I have to mention that the healthier you eat the better your body can start healing. It is important to increasing your protein intake.  This means limiting the foods with added sugar.  Focus on fruits and vegetables and some meat. It is very important to drink water after your surgery.  If your urine is bright yellow, then it is concentrated, and you need to drink more water.  As a general rule after surgery, you should have 8 ounces of water every hour while awake.  If you find you are persistently nauseated or unable to take in liquids let us know.  NO TOBACCO USE or EXPOSURE.  This will slow your healing process and increase the risk of a wound.  WOUND CARE If you don't have a drain: You can shower the day after surgery.  Use fragrance free soap.  Dial, [place], [place] and Cetaphil are usually mild on the skin.  If you have steri-strips / tape directly attached to your skin leave them in place. It is OK to get these wet.  No baths, pools or hot tubs for two weeks. We close your incision to leave the smallest and best-looking scar. No ointment or creams on your incisions until given the go ahead.  Especially not Neosporin (Too many skin reactions with this one).  A few weeks after surgery you can use Mederma and start massaging the scar. We ask you to wear your binder or  sports bra for the first 6 weeks around the clock, including while sleeping. This provides added comfort and helps reduce the fluid accumulation at the surgery site.  ACTIVITY No heavy lifting until cleared by the doctor.  It is OK to walk and climb stairs. In fact, moving your legs is very important to decrease your risk of a blood clot.  It will also help keep you from getting deconditioned.  Every 1 to 2 hours get up and walk for 5 minutes. This will help with a quicker recovery back to normal.  Let pain be your guide so you don't do too much.  NO, you cannot do the spring cleaning and don't plan on taking care of anyone else.  This is your time for TLC.   WORK Everyone returns to work at different times. As a rough guide, most people take at least 1 - 2 weeks off prior to returning to work. If you need documentation for your job, bring the forms to your postoperative follow up visit.  DRIVING Arrange for someone to bring you home from the hospital.  You may be able to drive a few days after surgery but not while taking any narcotics or valium.  BOWEL MOVEMENTS Constipation can occur after anesthesia and while taking pain medication.  It is important to stay ahead for your comfort.  We recommend taking Milk of Magnesia (2 tablespoons; twice a day) while taking  the pain pills.  SEROMA This is fluid your body tried to put in the surgical site.  This is normal but if it creates excessive pain and swelling let us know.  It usually decreases in a few weeks.  MEDICATIONS and PAIN CONTROL At your preoperative visit for you history and physical you were given the following medications: 1. An antibiotic: Start this medication when you get home and take according to the instructions on the bottle. 2. Zofran 4 mg:  This is to treat nausea and vomiting.  You can take this every 6 hours as needed and only if needed. 3. Norco (hydrocodone/acetaminophen) 5/325 mg:  This is only to be used after you have  taken the motrin or the tylenol. Every 8 hours as needed. Over the counter Medication to take: 4. Ibuprofen (Motrin) 600 mg:  Take this every 6 hours.  If you have additional pain then take 500 mg of the tylenol.  Only take the Norco after you have tried these two. 5. Miralax or stool softener of choice: Take this according to the bottle if you take the [place] Call your surgeon's office if any of the following occur: . Fever 101 degrees F or greater . Excessive bleeding or fluid from the incision site. . Pain that increases over time without aid from the medications . Redness, warmth, or pus draining from incision sites . Persistent nausea or inability to take in liquids . Severe misshapen area that underwent the operation.  *Use Nitroglycerin ointment 1/2in each nipple every 8 hours for the next 24 hours. Your next dose is 1/2in each nipple at 12:30 am.   Post Anesthesia Home Care Instructions  Activity: Get plenty of rest for the remainder of the day. A responsible individual must stay with you for 24 hours following the procedure.  For the next 24 hours, DO NOT: -Drive a car -Paediatric nurse -Drink alcoholic beverages -Take any medication unless instructed by your physician -Make any legal decisions or sign important papers.  Meals: Start with liquid foods such as gelatin or soup. Progress to regular foods as tolerated. Avoid greasy, spicy, heavy foods. If nausea and/or vomiting occur, drink only clear liquids until the nausea and/or vomiting subsides. Call your physician if vomiting continues.  Special Instructions/Symptoms: Your throat may feel dry or sore from the anesthesia or the breathing tube placed in your throat during surgery. If this causes discomfort, gargle with warm salt water. The discomfort should disappear within 24 hours.  If you had a scopolamine patch placed behind your ear for the management of post- operative nausea and/or vomiting:  1. The  medication in the patch is effective for 72 hours, after which it should be removed.  Wrap patch in a tissue and discard in the trash. Wash hands thoroughly with soap and water. 2. You may remove the patch earlier than 72 hours if you experience unpleasant side effects which may include dry mouth, dizziness or visual disturbances. 3. Avoid touching the patch. Wash your hands with soap and water after contact with the patch.

## 2020-10-04 NOTE — Op Note (Signed)
Breast Reduction Op note:    DATE OF PROCEDURE: 10/04/2020  LOCATION: [place]  SURGEON: Lyndee Leo Sanger Tao Satz, DO  ASSISTANT: Roetta Sessions, PA  PREOPERATIVE DIAGNOSIS 1. Right breast cancer 2. Breast asymmetry  3. Macromastia, Neck Pain, Back Pain  POSTOPERATIVE DIAGNOSIS same  PROCEDURES 1. Bilateral oncoplastic breast reduction.  Right reduction 215 g, Left reduction 761 g  COMPLICATIONS: None.  DRAINS: none  INDICATIONS FOR PROCEDURE Cassandra Cox is a 59 y.o. year old female born on 06-25-1961, with right breast cancer.  She had 1-2 breast reductions in the past and best we could figure it was with an inferior pedicle technique.  She has a history of symptomatic macromastia with concominant back pain, neck pain, shoulder grooving from her bra.   MRN: [id number]  CONSENT Informed consent was obtained directly from the patient. The risks, benefits and alternatives were fully discussed. Specific risks including but not limited to bleeding, infection, hematoma, seroma, scarring, pain, nipple necrosis, asymmetry, poor cosmetic results, and need for further surgery were discussed. The patient had ample opportunity to have her questions answered to her satisfaction.  The patient was at increased risk due to her previous reductions 20 years ago.  DESCRIPTION OF PROCEDURE  Patient was brought into the operating room and placed in a supine position.  SCDs were placed and appropriate padding was performed.  Antibiotics were given. The patient underwent general anesthesia and the chest was prepped and draped in a sterile fashion.  A timeout was performed and all information was confirmed to be correct.  Right:  Preoperative markings were confirmed.  General surgery performed their portion of the case which included a partial mastectomy of the right breast.  Their portion of the case is dictated separately.  I assisted throughout the case with retraction  and countertraction.  Once they were finished with their portion of the case the patient was rendered to the plastic surgery service and we proceeded with the oncoplastic reduction.  Incision lines were injected with 1% Xylocaine with epinephrine.  After waiting for vasoconstriction, the marked lines were incised.  An inferior pedical breast reduction was performed by de-epithelializing the pedicle from the previous surgery. The skin markings were as a superior medial pedicle technique but the flap was an inferior pedicle based on her previous history.  This was the same for the left side as well.  The bovie was used to create the lateral flap in order to removing breast tissue.  An area of inferior and lateral to the partial mastectomy site breast tissue was excised and marked laterally and inferiorly according to the color chart.  Additional tissue was removed from the lateral inferior portion of the breast.  Care was exercised to not undermine the breast pedicle. Hemostasis was achieved.  The nipple was raised into position and the deep layers were closed with 3-0 Monocryl followed by 4-0 Monocryl.  The vertical limb was closed with 3-0 PDS followed by 3-0 Monocryl and 4-0 Monocryl.  The inframammary fold was not insized.  Hemostasis was confirmed.   The nipple and skin flaps had good capillary refill at the end of the procedure.   Left:  Preoperative markings were confirmed.  Incision lines were injected with 1% Xylocaine with epinephrine.  After waiting for vasoconstriction, the marked lines were incised.  An inferior pedical breast reduction was performed by de-epithelializing the pedicle from the previous surgery. The bovie was used to create the lateral flap in order to removing breast tissue  laterally and superiorly.  Care was exercised to not undermine the breast pedicle. Hemostasis was achieved.  The nipple was raised into position and the deep layers were closed with 3-0 Monocryl followed by 4-0  Monocryl.  The vertical limb was closed with 3-0 PDS followed by 3-0 Monocryl and 4-0 Monocryl.  The inframammary fold was not insized.  Hemostasis was confirmed.   The nipple and skin flaps had good capillary refill at the end of the procedure.  The axillary incision was closed with 3-0 Monocryl followed by a 4-0 Monocryl Dermabond and Steri-Strips were applied with a sterile dressing.  The patient tolerated the procedure well. The patient was allowed to wake from anesthesia and taken to the recovery room in satisfactory condition.  The advanced practice practitioner (APP) assisted throughout the case.  The APP was essential in retraction and counter traction when needed to make the case progress smoothly.  This retraction and assistance made it possible to see the tissue plans for the procedure.  The assistance was needed for blood control, tissue re-approximation and assisted with closure of the incision site.

## 2020-10-04 NOTE — Interval H&P Note (Signed)
History and Physical Interval Note:  10/04/2020 1:21 PM  Cassandra Cox  has presented today for surgery, with the diagnosis of RIGHT BREAST CANCER.  The various methods of treatment have been discussed with the patient and family. After consideration of risks, benefits and other options for treatment, the patient has consented to  Procedure(s): RIGHT BREAST REDUCTION LUMPECTOMY WITH RADIOACTIVE SEED AND SENTINEL LYMPH NODE BIOPSY (Right) MAMMARY REDUCTION  (BREAST) (Bilateral) as a surgical intervention.  The patient's history has been reviewed, patient examined, no change in status, stable for surgery.  I have reviewed the patient's chart and labs.  Questions were answered to the patient's satisfaction.     Loel Lofty Braheem Tomasik

## 2020-10-04 NOTE — Anesthesia Preprocedure Evaluation (Signed)
Anesthesia Evaluation  Patient identified by MRN, date of birth, ID band Patient awake    Reviewed: Allergy & Precautions, NPO status , Patient's Chart, lab work & pertinent test results  History of Anesthesia Complications (+) PONV  Airway Mallampati: II  TM Distance: >3 FB Neck ROM: Full    Dental no notable dental hx.    Pulmonary neg pulmonary ROS,    Pulmonary exam normal breath sounds clear to auscultation       Cardiovascular hypertension, Pt. on medications + CAD, + Past MI and + Cardiac Stents  Normal cardiovascular exam Rhythm:Regular Rate:Normal     Neuro/Psych Anxiety Depression negative neurological ROS  negative psych ROS   GI/Hepatic negative GI ROS, Neg liver ROS,   Endo/Other  negative endocrine ROSdiabetes, Type 2, Oral Hypoglycemic Agents  Renal/GU negative Renal ROS  negative genitourinary   Musculoskeletal negative musculoskeletal ROS (+)   Abdominal (+) + obese,   Peds negative pediatric ROS (+)  Hematology negative hematology ROS (+)   Anesthesia Other Findings   Reproductive/Obstetrics negative OB ROS                             Anesthesia Physical Anesthesia Plan  ASA: III  Anesthesia Plan: General   Post-op Pain Management:  Regional for Post-op pain   Induction: Intravenous  PONV Risk Score and Plan: 4 or greater and Ondansetron, Dexamethasone, Midazolam, Droperidol and Treatment may vary due to age or medical condition  Airway Management Planned: Oral ETT  Additional Equipment:   Intra-op Plan:   Post-operative Plan: Extubation in OR  Informed Consent: I have reviewed the patients History and Physical, chart, labs and discussed the procedure including the risks, benefits and alternatives for the proposed anesthesia with the patient or authorized representative who has indicated his/her understanding and acceptance.     Dental advisory  given  Plan Discussed with: CRNA  Anesthesia Plan Comments:         Anesthesia Quick Evaluation

## 2020-10-04 NOTE — Interval H&P Note (Signed)
History and Physical Interval Note:  10/04/2020 11:21 AM  Cassandra Cox  has presented today for surgery, with the diagnosis of RIGHT BREAST CANCER.  The various methods of treatment have been discussed with the patient and family. After consideration of risks, benefits and other options for treatment, the patient has consented to  Procedure(s): RIGHT BREAST REDUCTION LUMPECTOMY WITH RADIOACTIVE SEED AND SENTINEL LYMPH NODE BIOPSY (Right) MAMMARY REDUCTION  (BREAST) (Bilateral) as a surgical intervention.  The patient's history has been reviewed, patient examined, no change in status, stable for surgery.  I have reviewed the patient's chart and labs.  Questions were answered to the patient's satisfaction.     [name]

## 2020-10-04 NOTE — Progress Notes (Signed)
Assisted Dr. Sabra Heck with right, ultrasound guided, pectoralis block. Side rails up, monitors on throughout procedure. See vital signs in flow sheet. Tolerated Procedure well.

## 2020-10-04 NOTE — H&P (Signed)
Cassandra Cox  Location: [facility] Patient #: [id number] DOB: 1961/05/01 Undefined / Language: Cleophus Molt / Race: White Female   History of Present Illness  The patient is a 59 year old female who presents with breast cancer.We are asked to see the patient in consultation by Dr. Burr Medico to evaluate her for a new right breast cancer. The patient is a 59 year old white female who presents with a 1.2cm mass in the central right breast. The axilla looked neg. this was biopsied and came back as a grade 3 IDC that was ER and PR + and Her2 - with a Ki67 of 30%. She does have a h/o MI 3 years ago and had a stent placed. She is on Plavix. She is followed by Dr. Saunders Revel in cardiology. she does not smoke. she does have a family history of breast cancer in an aunt.   Past Surgical History  Breast Augmentation  Bilateral. Breast Biopsy  Right. Hysterectomy (not due to cancer) - Partial   Diagnostic Studies History Colonoscopy  5-10 years ago Mammogram  within last year Pap Smear  1-5 years ago  Medication History  Medications Reconciled  Social History  No alcohol use  No caffeine use  No drug use  Tobacco use  Never smoker.  Family History  Arthritis  Mother. Breast Cancer  Family Members In General. Cerebrovascular Accident  Mother. Depression  Mother. Diabetes Mellitus  Mother. Heart Disease  Mother. Hypertension  Father, Mother. Migraine Headache  Mother.  Pregnancy / Birth History  Age at menarche  61 years. Contraceptive History  Depo-provera, Oral contraceptives. Gravida  2 Length (months) of breastfeeding  3-6 Maternal age  87-30 Para  2 Regular periods   Other Problems  Anxiety Disorder  Depression  Diverticulosis  Hemorrhoids  Hypercholesterolemia  Kidney Stone  Myocardial infarction  Thyroid Disease     Review of Systems  General Not Present- Appetite Loss, Chills, Fatigue, Fever, Night Sweats, Weight Gain and Weight  Loss. Skin Not Present- Change in Wart/Mole, Dryness, Hives, Jaundice, New Lesions, Non-Healing Wounds, Rash and Ulcer. HEENT Present- Seasonal Allergies and Wears glasses/contact lenses. Not Present- Earache, Hearing Loss, Hoarseness, Nose Bleed, Oral Ulcers, Ringing in the Ears, Sinus Pain, Sore Throat, Visual Disturbances and Yellow Eyes. Respiratory Present- Snoring. Not Present- Bloody sputum, Chronic Cough, Difficulty Breathing and Wheezing. Breast Not Present- Breast Mass, Breast Pain, Nipple Discharge and Skin Changes. Cardiovascular Not Present- Chest Pain, Difficulty Breathing Lying Down, Leg Cramps, Palpitations, Rapid Heart Rate, Shortness of Breath and Swelling of Extremities. Gastrointestinal Not Present- Abdominal Pain, Bloating, Bloody Stool, Change in Bowel Habits, Chronic diarrhea, Constipation, Difficulty Swallowing, Excessive gas, Gets full quickly at meals, Hemorrhoids, Indigestion, Nausea, Rectal Pain and Vomiting. Female Genitourinary Not Present- Frequency, Nocturia, Painful Urination, Pelvic Pain and Urgency. Musculoskeletal Not Present- Back Pain, Joint Pain, Joint Stiffness, Muscle Pain, Muscle Weakness and Swelling of Extremities. Neurological Not Present- Decreased Memory, Fainting, Headaches, Numbness, Seizures, Tingling, Tremor, Trouble walking and Weakness. Psychiatric Present- Anxiety and Depression. Not Present- Bipolar, Change in Sleep Pattern, Fearful and Frequent crying. Endocrine Present- Hot flashes. Not Present- Cold Intolerance, Excessive Hunger, Hair Changes, Heat Intolerance and New Diabetes. Hematology Present- Blood Thinners and Easy Bruising. Not Present- Excessive bleeding, Gland problems, HIV and Persistent Infections.   Physical Exam  General Mental Status-Alert. General Appearance-Consistent with stated age. Hydration-Well hydrated. Voice-Normal.  Head and Neck Head-normocephalic, atraumatic with no lesions or palpable  masses. Trachea-midline. Thyroid Gland Characteristics - normal size and consistency.  Eye Eyeball - Bilateral-Extraocular movements intact. Sclera/Conjunctiva - Bilateral-No scleral icterus.  Chest and Lung Exam Chest and lung exam reveals -quiet, even and easy respiratory effort with no use of accessory muscles and on auscultation, normal breath sounds, no adventitious sounds and normal vocal resonance. Inspection Chest Wall - Normal. Back - normal.  Breast Note: there is no palpable mass in either breast. there is no palpable axillary, supraclavicular, or cervical lymphadenopathy   Cardiovascular Cardiovascular examination reveals -normal heart sounds, regular rate and rhythm with no murmurs and normal pedal pulses bilaterally.  Abdomen Inspection Inspection of the abdomen reveals - No Hernias. Skin - Scar - no surgical scars. Palpation/Percussion Palpation and Percussion of the abdomen reveal - Soft, Non Tender, No Rebound tenderness, No Rigidity (guarding) and No hepatosplenomegaly. Auscultation Auscultation of the abdomen reveals - Bowel sounds normal.  Neurologic Neurologic evaluation reveals -alert and oriented x 3 with no impairment of recent or remote memory. Mental Status-Normal.  Musculoskeletal Normal Exam - Left-Upper Extremity Strength Normal and Lower Extremity Strength Normal. Normal Exam - Right-Upper Extremity Strength Normal and Lower Extremity Strength Normal.  Lymphatic Head & Neck  General Head & Neck Lymphatics: Bilateral - Description - Normal. Axillary  General Axillary Region: Bilateral - Description - Normal. Tenderness - Non Tender. Femoral & Inguinal  Generalized Femoral & Inguinal Lymphatics: Bilateral - Description - Normal. Tenderness - Non Tender.    Assessment & Plan  MALIGNANT NEOPLASM OF CENTRAL PORTION OF RIGHT BREAST IN FEMALE, ESTROGEN RECEPTOR POSITIVE (C50.111) Impression: The patient appears to have a  small stage 1 cancer in the central right breast. I have discussed with her the different options for treatment and at this point she is undecided between lumpectomy and mastectomy with reconstruction. She has always had large breasts and experiences a lot of pain in her neck and shoulders because of this. I will refer her to Dr. Marla Roe in Plastic surgery to discuss the options. I will also ask cardiology for clearance and management of her blood thinner. She will let us know when she makes a final decision. This patient encounter took 60 minutes today to perform the following: take history, perform exam, review outside records, interpret imaging, counsel the patient on their diagnosis and document encounter, findings & plan in the EHR Current Plans Referred to Oncology, for evaluation and follow up (Oncology). Routine. Referred to Surgery - Plastic, for evaluation and follow up (Plastic Surgery). Routine.

## 2020-10-04 NOTE — Op Note (Signed)
10/04/2020  2:55 PM  PATIENT:  Cassandra Cox  59 y.o. female  PRE-OPERATIVE DIAGNOSIS:  RIGHT BREAST CANCER  POST-OPERATIVE DIAGNOSIS:  RIGHT BREAST  CANCER  PROCEDURE:  Procedure(s): RIGHT BREAST REDUCTION LUMPECTOMY WITH RADIOACTIVE SEED LOCALIZATION AND DEE[ RIGHT AXILLARY SENTINEL LYMPH NODE BIOPSY (Right)  SURGEON:  Surgeon(s) and Role:    * Jovita Kussmaul, MD - Primary  PHYSICIAN ASSISTANT:   ASSISTANTS: Dr. Marla Roe   ANESTHESIA:   local and general  EBL:  minimal   BLOOD ADMINISTERED:none  DRAINS: none   LOCAL MEDICATIONS USED:  MARCAINE     SPECIMEN:  Source of Specimen:  right breast tissue with additional deep and superior margins. green and red ink has been reversed  DISPOSITION OF SPECIMEN:  PATHOLOGY  COUNTS:  YES  TOURNIQUET:  * No tourniquets in log *  DICTATION: .Dragon Dictation   After informed consent was obtained the patient was brought to the operating room and placed in the supine position on the operating table.  After adequate induction of general anesthesia the patient's bilateral chest, breast, and axillary areas were prepped with ChloraPrep, allowed to dry, and draped in usual sterile manner.  An appropriate timeout was performed.  Previously an I-125 seed was placed in the upper aspect of the right breast to mark an area of invasive breast cancer.  Also earlier in the day the patient underwent injection 1 mCi of technetium sulfur colloid in the subareolar position on the right.  The neoprobe was initially set to technetium and an area of radioactivity was readily identified in the right axilla.  This area was infiltrated with quarter percent Marcaine.  A small transversely oriented incision was made overlying the area of radioactivity.  The incision was carried through the skin and subcutaneous tissue sharply with the electrocautery until the deep right axillary space was entered.  The neoprobe was used to direct blunt hemostat dissection.  I  was able to identify 2 lymph nodes.  The first 1 was hot and the second 1 was palpable.  Both were removed sharply with the electrocautery and the surrounding lymphatics and small vessels were controlled with clips.  Ex vivo counts on sentinel node #1 were approximately 100.  No other hot or palpable nodes were identified in the right axilla.  These were sent as sentinel nodes numbers 1 and 2.  The deep layer of the wound was then closed with interrupted 3-0 Vicryl stitches.  The skin was closed with a running 4-0 Monocryl subcuticular stitch.  Attention was then turned to the right breast.  The patient elected to have a reduction lumpectomy.  The plastic surgeon was present and marked the patient along the lines where an incision could be made.  I made an incision along the upper portion of the breast where the plastic surgeon had marked out the area.  The neoprobe was set to I-125.  The incision was carried through the skin and subcutaneous tissue sharply with the electrocautery.  Dissection was then carried out towards the radioactive seed under the direction of the neoprobe.  Once I more closely approached the radioactive seed I then removed a circular portion of breast tissue sharply with the electrocautery around the radioactive seed while checking the area of radioactivity frequently.  Once the specimen was removed it was oriented with the appropriate paint colors.  I transposed the green and red colors.  A specimen radiograph was obtained that showed the clip and seed to be near the center  of the specimen.  It did seem to be close to the deep and superior margins.  I removed additional deep and superior margins and marked these appropriately.  Hemostasis was achieved using the Bovie electrocautery.  At this point the patient tolerated the procedure well.  All needle sponge and instrument counts were correct.  The operation was then turned over to Dr. Marla Roe for the reduction.  Her portion of the  operation will be dictated separately.  PLAN OF CARE: Discharge to home after PACU  PATIENT DISPOSITION:  PACU - hemodynamically stable.   Delay start of Pharmacological VTE agent (>24hrs) due to surgical blood loss or risk of bleeding: not applicable

## 2020-10-04 NOTE — Transfer of Care (Signed)
Immediate Anesthesia Transfer of Care Note  Patient: Cassandra Cox  Procedure(s) Performed: RIGHT BREAST REDUCTION LUMPECTOMY WITH RADIOACTIVE SEED AND SENTINEL LYMPH NODE BIOPSY (Right Breast) BILATERAL MAMMARY REDUCTION  (BREAST) (Bilateral Breast)  Patient Location: PACU  Anesthesia Type:General  Level of Consciousness: alert , oriented, drowsy and patient cooperative  Airway & Oxygen Therapy: Patient Spontanous Breathing and Patient connected to face mask oxygen  Post-op Assessment: Report given to RN and Post -op Vital signs reviewed and stable  Post vital signs: Reviewed and stable  Last Vitals:  Vitals Value Taken Time  BP    Temp    Pulse    Resp    SpO2      Last Pain:  Vitals:   10/04/20 1102  TempSrc: Oral  PainSc: 0-No pain         Complications: No complications documented.

## 2020-10-04 NOTE — Anesthesia Procedure Notes (Signed)
Procedure Name: Intubation Date/Time: 10/04/2020 2:15 PM Performed by: Myna Bright, CRNA Pre-anesthesia Checklist: Patient identified, Emergency Drugs available, Suction available and Patient being monitored Patient Re-evaluated:Patient Re-evaluated prior to induction Oxygen Delivery Method: Circle system utilized Preoxygenation: Pre-oxygenation with 100% oxygen Induction Type: IV induction Ventilation: Mask ventilation without difficulty Laryngoscope Size: Mac and 3 Grade View: Grade I Tube type: Oral Tube size: 7.0 mm Number of attempts: 1 Airway Equipment and Method: Stylet Placement Confirmation: ETT inserted through vocal cords under direct vision,  positive ETCO2 and breath sounds checked- equal and bilateral Secured at: 21 cm Tube secured with: Tape Dental Injury: Teeth and Oropharynx as per pre-operative assessment

## 2020-10-04 NOTE — Anesthesia Postprocedure Evaluation (Signed)
Anesthesia Post Note  Patient: Cassandra Cox  Procedure(s) Performed: RIGHT BREAST REDUCTION LUMPECTOMY WITH RADIOACTIVE SEED AND SENTINEL LYMPH NODE BIOPSY (Right Breast) BILATERAL MAMMARY REDUCTION  (BREAST) (Bilateral Breast)     Patient location during evaluation: PACU Anesthesia Type: General Level of consciousness: awake and alert Pain management: pain level controlled Vital Signs Assessment: post-procedure vital signs reviewed and stable Respiratory status: spontaneous breathing, nonlabored ventilation and respiratory function stable Cardiovascular status: blood pressure returned to baseline and stable Postop Assessment: no apparent nausea or vomiting Anesthetic complications: no   No complications documented.  Last Vitals:  Vitals:   10/04/20 1700 10/04/20 1707  BP: 111/69 114/71  Pulse: 87 87  Resp: (!) 8 17  Temp:    SpO2: 94% 92%    Last Pain:  Vitals:   10/04/20 1700  TempSrc:   PainSc: 3                  Lynda Rainwater

## 2020-10-04 NOTE — Anesthesia Procedure Notes (Signed)
Anesthesia Regional Block: Pectoralis block   Pre-Anesthetic Checklist: ,, timeout performed, Correct Patient, Correct Site, Correct Laterality, Correct Procedure, Correct Position, site marked, Risks and benefits discussed,  Surgical consent,  Pre-op evaluation,  At surgeon's request and post-op pain management  Laterality: Right  Prep: chloraprep       Needles:  Injection technique: Single-shot  Needle Type: Stimiplex     Needle Length: 9cm  Needle Gauge: 21     Additional Needles:   Procedures:,,,, ultrasound used (permanent image in chart),,,,  Narrative:  Start time: 10/04/2020 12:42 PM End time: 10/04/2020 12:47 PM Injection made incrementally with aspirations every 5 mL.  Performed by: Personally  Anesthesiologist: Lynda Rainwater, MD

## 2020-10-05 ENCOUNTER — Encounter (HOSPITAL_BASED_OUTPATIENT_CLINIC_OR_DEPARTMENT_OTHER): Payer: Self-pay | Admitting: General Surgery

## 2020-10-06 ENCOUNTER — Institutional Professional Consult (permissible substitution): Payer: 59 | Admitting: Internal Medicine

## 2020-10-08 ENCOUNTER — Encounter: Payer: Self-pay | Admitting: *Deleted

## 2020-10-08 ENCOUNTER — Telehealth: Payer: Self-pay | Admitting: *Deleted

## 2020-10-08 NOTE — Telephone Encounter (Signed)
Received order for Mammapint testing. Requisition faxed to pathology and Smiley Houseman

## 2020-10-14 ENCOUNTER — Telehealth: Payer: Self-pay | Admitting: *Deleted

## 2020-10-14 NOTE — Telephone Encounter (Signed)
Spoke to pt concerning the next steps in treatment plan. Discussed waiting on mammaprint results to return and will refer to med or rad onc pending results. Received verbal understanding. No further needs voiced.

## 2020-10-15 ENCOUNTER — Encounter: Payer: Self-pay | Admitting: Plastic Surgery

## 2020-10-15 ENCOUNTER — Ambulatory Visit (INDEPENDENT_AMBULATORY_CARE_PROVIDER_SITE_OTHER): Payer: 59 | Admitting: Plastic Surgery

## 2020-10-15 ENCOUNTER — Other Ambulatory Visit: Payer: Self-pay

## 2020-10-15 VITALS — BP 129/86 | HR 82 | Temp 98.4°F

## 2020-10-15 DIAGNOSIS — C50411 Malignant neoplasm of upper-outer quadrant of right female breast: Secondary | ICD-10-CM

## 2020-10-15 DIAGNOSIS — Z170 Estrogen receptor positive status [ER+]: Secondary | ICD-10-CM

## 2020-10-15 NOTE — Progress Notes (Signed)
The patient is a 59 year old female here for follow-up after undergoing bilateral oncoplastic breast reduction.  She is doing well.  All incisions are intact.  Her dressings are in place.  She has a little bit of a seroma noted on the right side.  I would like to give her another week to see if she will start to absorb that on her own.  If not we can certainly do an aspiration.  Should her pain is well controlled she is sleeping well.  We will plan on removing the dressing next week.  She can go into a sports bra.

## 2020-10-19 ENCOUNTER — Telehealth: Payer: Self-pay | Admitting: *Deleted

## 2020-10-19 NOTE — Telephone Encounter (Signed)
Received Mammaprint of HIGH RISK. Physician team notified.

## 2020-10-19 NOTE — Telephone Encounter (Signed)
Spoke to pt regarding High Risk Mammaprint results as well as scheduled and confirmed appt with Dr. Burr Medico on 10/20/20 to discuss treatment recommendations. No further needs voiced

## 2020-10-20 ENCOUNTER — Inpatient Hospital Stay: Payer: 59 | Attending: Hematology | Admitting: Hematology

## 2020-10-20 ENCOUNTER — Other Ambulatory Visit: Payer: Self-pay

## 2020-10-20 ENCOUNTER — Encounter: Payer: Self-pay | Admitting: Hematology

## 2020-10-20 VITALS — BP 139/81 | HR 92 | Temp 97.9°F | Resp 17 | Ht 63.0 in | Wt 183.8 lb

## 2020-10-20 DIAGNOSIS — C50411 Malignant neoplasm of upper-outer quadrant of right female breast: Secondary | ICD-10-CM | POA: Diagnosis not present

## 2020-10-20 DIAGNOSIS — N951 Menopausal and female climacteric states: Secondary | ICD-10-CM | POA: Diagnosis not present

## 2020-10-20 DIAGNOSIS — Z170 Estrogen receptor positive status [ER+]: Secondary | ICD-10-CM | POA: Insufficient documentation

## 2020-10-20 MED ORDER — DEXAMETHASONE 4 MG PO TABS: 4.0000 mg | ORAL_TABLET | Freq: Two times a day (BID) | ORAL | 1 refills | Status: DC

## 2020-10-20 MED ORDER — ONDANSETRON HCL 8 MG PO TABS: 8.0000 mg | ORAL_TABLET | Freq: Two times a day (BID) | ORAL | 1 refills | Status: DC | PRN

## 2020-10-20 MED ORDER — PROCHLORPERAZINE MALEATE 10 MG PO TABS: 10.0000 mg | ORAL_TABLET | Freq: Four times a day (QID) | ORAL | 1 refills | Status: DC | PRN

## 2020-10-20 NOTE — Progress Notes (Signed)
[place]   [phone] [phone]   Clinic Follow up Note   Patient Care Team: Pcp, No as PCP - General End, Harrell Gave, MD as PCP - Cardiology (Cardiology) Mauro Kaufmann, RN as Oncology Nurse Navigator Rockwell Germany, RN as Oncology Nurse Navigator Jovita Kussmaul, MD as Consulting Physician (General Surgery) Truitt Merle, MD as Consulting Physician (Hematology) Kyung Rudd, MD as Consulting Physician (Radiation Oncology)  Date of Service:  10/20/2020  CHIEF COMPLAINT: F/u of right breast cancer   SUMMARY OF ONCOLOGIC HISTORY: Oncology History Overview Note  Cancer Staging Malignant neoplasm of upper-outer quadrant of right breast in female, estrogen receptor positive ([place]) Staging form: Breast, AJCC 8th Edition - Clinical stage from 08/18/2020: Stage IA (cT1c, cN0, cM0, G3, ER+, PR+, HER2-) - Unsigned    Malignant neoplasm of upper-outer quadrant of right breast in female, estrogen receptor positive ([place])  07/20/2020 Mammogram   IMPRESSION The 1.1 cm irregular mass in the uper outer right breast, 10-11:00 position, 7cm form the nipple is indeterminate.    08/09/2020 Initial Biopsy   Diagnosis 08/09/20 Breast, right, needle core biopsy -INVASIVE MAMMARY CARCINOMA -MAMMARY CARCINOMA IN SITU -CALCIFICATIONS -SEE COMMENT   -Grade 3  E-cadherin is POSITIVE supporting a ductal origin   08/09/2020 Receptors her2   ER 95% PR - 90% Ki67 - 30% HER2 NEGATIVE   08/13/2020 Initial Diagnosis   Malignant neoplasm of upper-outer quadrant of right breast in female, estrogen receptor positive ([employer])   10/04/2020 Cancer Staging   Staging form: Breast, AJCC 8th Edition - Pathologic stage from 10/04/2020: Stage IA (pT1c, pN1a, cM0, G2, ER+, PR+, HER2-) - Signed by Truitt Merle, MD on 10/20/2020   10/04/2020 Surgery   RIGHT BREAST REDUCTION LUMPECTOMY WITH RADIOACTIVE SEED AND SENTINEL LYMPH NODE BIOPSY and BILATERAL MAMMARY REDUCTION  (BREAST) by Dr  Marlou Starks and Dr Marla Roe    10/04/2020 Pathology Results   FINAL MICROSCOPIC DIAGNOSIS:   A. LYMPH NODE, RIGHT AXILLARY SENTINEL, BIOPSY:  -  No carcinoma identified in one lymph node (0/1)   B. LYMPH NODE, RIGHT AXILLARY SENTINEL, BIOPSY  -  Metastatic carcinoma involving one lymph node (1/1)  -  No extracapsular extension identified   C. LYMPH NODE, RIGHT AXILLARY SENTINEL #2, BIOPSY:  -  No carcinoma identified in one lymph node (0/1)   D. BREAST, RIGHT, LUMPECTOMY:  -  Invasive ductal carcinoma, Nottingham grade 2 of 3, 1.2 cm  -  Ductal carcinoma in-situ, intermediate grade  -  Calcifications associated with carcinoma  -  Margins uninvolved by carcinoma (<0.1 cm; medial)  -  Lymphovascular space invasion present  -  Previous biopsy site changes present  -  See oncology table and comment below   E. BREAST, RIGHT DEEP MARGIN, EXCISION:  -  Residual invasive ductal carcinoma  -  Margin uninvolved by carcinoma (0.1 cm; posterior)   F. BREAST, RIGHT SUPERIOR MARGIN, EXCISION:  -  Benign adenosis with calcifications  -  No residual carcinoma identified   G. BREAST, RIGHT INFERIOR LATERAL MARGIN, EXCISION:  -  Focal lobular neoplasia, adenosis and fibrocystic changes with  calcifications  -  No residual carcinoma identified   H. SKIN, RIGHT BREAST, EXCISION:  -  Benign skin and subcutaneous tissue with chronic inflammation  -  No malignancy identified   I. BREAST, RIGHT SUPERIOR NIPPLE, BIOPSY:  -  Benign breast tissue  -  No malignancy identified   J. BREAST, RIGHT LATERAL, EXCISION:  -  Adenosis and fibrocystic  changes with calcifications  -  No residual carcinoma identified   K. BREAST, LEFT, MAMMOPLASTY:  -  Adenosis, columnar cell and fibrocystic changes with calcifications  -  No malignancy identified       CURRENT THERAPY:  PENDING TC q3 weeks for 4 cycles starting 1/5 or 1/6  INTERVAL HISTORY:  Cassandra Cox is here for a follow up. She presents to  the clinic with her daughter. She has pain around her breast starting yesterday and right arm pain when reaching back. She notes she has bandage of her breast but has healed well. She plans to see PT soon.    REVIEW OF SYSTEMS:   Constitutional: Denies fevers, chills or abnormal weight loss Eyes: Denies blurriness of vision Ears, nose, mouth, throat, and face: Denies mucositis or sore throat Respiratory: Denies cough, dyspnea or wheezes Cardiovascular: Denies palpitation, chest discomfort or lower extremity swelling Gastrointestinal:  Denies nausea, heartburn or change in bowel habits Skin: Denies abnormal skin rashes Lymphatics: Denies new lymphadenopathy or easy bruising Neurological:Denies numbness, tingling or new weaknesses Behavioral/Psych: Mood is stable, no new changes  All other systems were reviewed with the patient and are negative.  MEDICAL HISTORY:  Past Medical History:  Diagnosis Date  . Complication of anesthesia   . Coronary artery disease    a. 11/2016 Cath/PCI: LM nl, LAD 30ost, 49m(3.0x18 Resolute Integrity DES--3.75), LCX nl, RCA nl; b. MV 11/18: negative for ischemia, EF 72%, low risk  . Depression   . Diabetes [place] complication [place]   . Diastolic dysfunction    a. 11/2016 Echo: EF 55-60%, Gr2 DD, LAE, nl RV size/fxn.  . Hyperlipemia \\[id number]\ . Kidney stone 08/2016  . MVP (mitral valve prolapse)   . PONV (postoperative nausea and vomiting)   . STEMI (ST elevation myocardial infarction) ([place] 12/02/2016  . Vitamin B 12 deficiency     SURGICAL HISTORY: Past Surgical History:  Procedure Laterality Date  . ABDOMINAL HYSTERECTOMY  2003   TAH due to adenomyosis  . BREAST LUMPECTOMY WITH RADIOACTIVE SEED AND SENTINEL LYMPH NODE BIOPSY Right 10/04/2020   Procedure: RIGHT BREAST REDUCTION LUMPECTOMY WITH RADIOACTIVE SEED AND SENTINEL LYMPH NODE BIOPSY;  Surgeon: TJovita Kussmaul MD;  [facility]  Service: General;   Laterality: Right;  . BREAST REDUCTION SURGERY  1990  . BREAST REDUCTION SURGERY Bilateral 10/04/2020   Procedure: BILATERAL MAMMARY REDUCTION  (BREAST);  Surgeon: DWallace Going DO;  [place] [facility]  Service: Plastics;  Laterality: Bilateral;  . CARDIAC CATHETERIZATION N/A 12/02/2016   Procedure: Coronary Stent Intervention;  Surgeon: CNelva Bush MD;  [facility];  Service: Cardiovascular;  Laterality: N/A;  . CARDIAC CATHETERIZATION N/A 12/02/2016   Procedure: Left Heart Cath and Coronary Angiography;  Surgeon: CNelva Bush MD;  [facility];  Service: Cardiovascular;  Laterality: N/A;  . CARDIAC CATHETERIZATION N/A 12/02/2016   Procedure: Intravascular Ultrasound/IVUS;  Surgeon: CNelva Bush MD;  [facility];  Service: Cardiovascular;  Laterality: N/A;  . CORONARY STENT INTERVENTION    . MOUTH SURGERY     Dental implant    I have reviewed the social history and family history with the patient and they are unchanged from previous note.  ALLERGIES:  is allergic to prednisone, sulfa antibiotics, sulfasalazine, and sulfonamide derivatives.  MEDICATIONS:  Current Outpatient Medications  Medication Sig Dispense Refill  . ALPRAZolam (XANAX) 0.25 MG tablet Take 0.25 mg by mouth in the  morning, at noon, in the evening, and at bedtime.     Francia Greaves THYROID 60 MG tablet Take 60 mg by mouth daily.    Marland Kitchen aspirin EC 81 MG tablet Take 81 mg by mouth daily.    Marland Kitchen atorvastatin (LIPITOR) 80 MG tablet TAKE 1 TABLET BY MOUTH DAILY AT 6PM GENERIC EQUIVALENT FOR LIPITOR 90 tablet 3  . buPROPion (WELLBUTRIN XL) 150 MG 24 hr tablet Take 300 mg by mouth daily.     . Cholecalciferol (VITAMIN D-3) 5000 UNITS TABS Take 1 tablet by mouth daily.     . clopidogrel (PLAVIX) 75 MG tablet TAKE 1 TABLET BY MOUTH DAILY GENERIC EQUIVALENT FOR PLAVIX 90 tablet 3  . Cyanocobalamin 1000 MCG/ML LIQD Inject 1 mL as directed once a  week.    . ezetimibe (ZETIA) 10 MG tablet TAKE 1 TABLET BY MOUTH DAILY. 90 tablet 3  . FLUoxetine (PROZAC) 20 MG capsule Take 20 mg by mouth daily.     Marland Kitchen ketoconazole (NIZORAL) 2 % cream Apply 1 application topically daily. 15 g 0  . levocetirizine (XYZAL) 5 MG tablet Take 1 tablet at bedtime by mouth.  2  . liothyronine (CYTOMEL) 5 MCG tablet Take 5 mcg by mouth every morning.     . magnesium gluconate (MAGONATE) 500 MG tablet Take 1,000 mg by mouth at bedtime.     . metFORMIN (GLUCOPHAGE-XR) 500 MG 24 hr tablet Take 500 mg by mouth 2 (two) times daily.    . montelukast (SINGULAIR) 10 MG tablet Take 10 mg by mouth at bedtime.    Marland Kitchen NALTREXONE HCL PO Take 4 mg by mouth at bedtime.     . nitroGLYCERIN (NITROSTAT) 0.4 MG SL tablet Place 1 tablet (0.4 mg total) under the tongue every 5 (five) minutes as needed for chest pain. For maximum of 3 doses. (Patient not taking: Reported on 09/20/2020) 25 tablet 3  . ondansetron (ZOFRAN) 4 MG tablet Take 1 tablet (4 mg total) by mouth every 8 (eight) hours as needed for nausea or vomiting. 20 tablet 0  . zolpidem (AMBIEN CR) 12.5 MG CR tablet Take 12.5 mg by mouth at bedtime as needed for sleep.     No current facility-administered medications for this visit.    PHYSICAL EXAMINATION: ECOG PERFORMANCE STATUS: 1 - Symptomatic but completely ambulatory  Vitals:   10/20/20 1526  BP: 139/81  Pulse: 92  Resp: 17  Temp: 97.9 F (36.6 C)  SpO2: 99%   Filed Weights   10/20/20 1526  Weight: 183 lb 12.8 oz (83.4 kg)   Due to COVID19 we will limit examination to appearance. Patient had no complaints.  GENERAL:alert, no distress and comfortable SKIN: skin color normal, no rashes or significant lesions EYES: normal, Conjunctiva are pink and non-injected, sclera clear  NEURO: alert & oriented x 3 with fluent speech   LABORATORY DATA:  I have reviewed the data as listed CBC Latest Ref Rng & Units 08/18/2020 03/03/2020 05/14/2019  WBC 4.0 - 10.5 K/uL 6.8  6.9 6.4  Hemoglobin 12.0 - 15.0 g/dL 12.1 12.2 12.7  Hematocrit 36.0 - 46.0 % 38.2 37.7 39  Platelets 150 - 400 K/uL 315 270 309     CMP Latest Ref Rng & Units 09/28/2020 08/18/2020 03/03/2020  Glucose 70 - 99 mg/dL 159(H) 85 94  BUN 6 - 20 mg/dL _0 Creatinine 0.44 - 1.00 mg/dL 0.84 0.72 0.66  Sodium 135 - 145 mmol/L 139 141 140  Potassium 3.5 - 5.1  mmol/L 4.3 4.1 4.1  Chloride 98 - 111 mmol/L 103 106 104  CO2 22 - 32 mmol/L _0 Calcium 8.9 - 10.3 mg/dL 9.7 9.7 9.1  Total Protein 6.5 - 8.1 g/dL - 7.1 6.5  Total Bilirubin 0.3 - 1.2 mg/dL - 0.6 0.6  Alkaline Phos 38 - 126 U/L - 103 99  AST 15 - 41 U/L - 18 21  ALT 0 - 44 U/L - 25 23      RADIOGRAPHIC STUDIES: I have personally reviewed the radiological images as listed and agreed with the findings in the report. No results found.   ASSESSMENT & PLAN:  AIREL MAGADAN is a 59 y.o. female with    1. Malignant neoplasm of upper-outer quadrant of right breast, Stage IA, c(T1cN0M0), ER+/PR+/HER2-, Grade III  -She was diagnosed in 08/2020. She had a 1.2cm mass in the right breast with grade 3 invasive ductal carcinoma with components of DCIS.  -She underwent right lumpectomy with B/l breast reduction on 10/04/20. Her surgical pathology showed 1.2cm mass of invasive ductal carcinoma with grade 2-3 disease was complete resection with and 1/3 positive LN (with limited extracapsular extension). I reviewed with patient in great detail today.  -give stage I disease, no need for staging scan  -Given her positive LN, Mammaprint testing was done and results showed High Risk Luminal Type B. I reviewed with patient in great detail today.  -Given her high risk of recurrence cancer, she would benefit from adjuvant chemotherapy to reduce her risk. I discussed chemotherapy options of Adriamycin and Cytoxan q2weeks for 4 cycles then Taxol weekly for 12 weeks or Cytoxan and Docetaxel q3weeks for 4 cycle. Given her H/o MI and current  diastolic dysfunction and strong ER+/PR+/HER2- stage I disease, I recommend TC regimen. She is agreeable.   --Chemotherapy consent: Side effects including but does not limited to, fatigue, nausea, vomiting, diarrhea, hair loss, neuropathy, fluid retention, renal and kidney dysfunction, neutropenic fever, needed for blood transfusion, bleeding, were discussed with patient in great detail. She agrees to proceed. -I discussed the option of Dignicap to help reduce hair loss from chemo. She will think about it. I gave her handout today  -I discussed the option of PAC placement during chemotherapy. She opted to proceed with Periphereal access for now.   -I discussed after chemotherapy she will proceed with adjuvant radiation to reduce her risk of local cancer recurrence followed by Antiestrogen therapy to reduce her risk of distant cancer recurrence. She is agreeable.  -Will proceed with chemo education class before start of treatment. Plan to start treatment on 1/5 or 1/6.  -f/u with start of treatment.    2. Hot flashes  -She has hot flashes related to her menopause. She was on progesterone for her hot flashes for the past 2-3 years. She has stopped since her cancer diagnosis.  -If her hot flashes are not tolerable on antiestrogen therapy we can alter or add to her current SSRIs.   3. Comorbidities: MI in 10/2017 S/p stent placement, Depression, Borderline Diabetic, Hypothyroidism, HLD -She sees Psychiatrist to manage depression, cardiologist and PCP. Continue medications   4. Social Support and financial support -She lives alone but has support from daughters, longtime boyfriend and family. -If she is interested in financial help, she can consult with [facility] financial advocate.  -She notes her longtime boyfriend is starting chemo for Non-Hodgkins Lymphoma in 10/2020.   5. Genetic Testing  -She has 1 maternal aunt with breast cancer. If she  is interested in genetic testing, she will likely have to  pay out of pocket. She is interested in Genetic counseling before proceeding. I will send referral.    PLAN:  -Send Genetic referral.  -Chemo education class in 2-3 weeks.  -Lab, F/u and chemo TC on 1/5 or 1/6 and 3 weeks later.  -she will call us if she wants to try DigniCap    No problem-specific Assessment & Plan notes found for this encounter.   No orders of the defined types were placed in this encounter.  All questions were answered. The patient knows to call the clinic with any problems, questions or concerns. No barriers to learning was detected. The total time spent in the appointment was 45 minutes.     Truitt Merle, MD 10/20/2020   I, Joslyn Devon, am acting as scribe for Truitt Merle, MD.   I have reviewed the above documentation for accuracy and completeness, and I agree with the above.

## 2020-10-20 NOTE — Progress Notes (Signed)
START ON PATHWAY REGIMEN - Breast     A cycle is every 21 days:     Docetaxel      Cyclophosphamide   **Always confirm dose/schedule in your pharmacy ordering system**  Patient Characteristics: Postoperative without Neoadjuvant Therapy (Pathologic Staging), Invasive Disease, Adjuvant Therapy, HER2 Negative/Unknown/Equivocal, ER Positive, Node Positive, Node Positive (1-3), MammaPrint(R) Ordered, High Genomic Risk Therapeutic Status: Postoperative without Neoadjuvant Therapy (Pathologic Staging) AJCC Grade: G3 AJCC N Category: pN1a AJCC M Category: cM0 ER Status: Positive (+) AJCC 8 Stage Grouping: IB HER2 Status: Negative (-) Oncotype Dx Recurrence Score: Ordered Other Genomic Test AJCC T Category: pT1c PR Status: Positive (+) Adjuvant Therapy Status: No Adjuvant Therapy Received Yet Has this patient completed genomic testing<= Yes - MammaPrint(R) MammaPrint(R) Score: High Genomic Risk Intent of Therapy: Curative Intent, Discussed with Patient

## 2020-10-21 ENCOUNTER — Telehealth: Payer: Self-pay | Admitting: Hematology

## 2020-10-21 ENCOUNTER — Institutional Professional Consult (permissible substitution): Payer: 59 | Admitting: Pulmonary Disease

## 2020-10-21 NOTE — Telephone Encounter (Signed)
Scheduled appts per 12/15 los. Pt confirmed appt dates and times.

## 2020-10-22 ENCOUNTER — Encounter: Payer: Self-pay | Admitting: *Deleted

## 2020-10-26 ENCOUNTER — Encounter: Payer: Self-pay | Admitting: Surgical

## 2020-10-26 ENCOUNTER — Encounter: Payer: Self-pay | Admitting: Hematology

## 2020-10-26 ENCOUNTER — Other Ambulatory Visit: Payer: Self-pay | Admitting: Oncology

## 2020-10-26 ENCOUNTER — Ambulatory Visit (INDEPENDENT_AMBULATORY_CARE_PROVIDER_SITE_OTHER): Payer: 59 | Admitting: Surgical

## 2020-10-26 ENCOUNTER — Other Ambulatory Visit: Payer: Self-pay

## 2020-10-26 VITALS — BP 130/76 | HR 86 | Temp 98.8°F

## 2020-10-26 DIAGNOSIS — Z170 Estrogen receptor positive status [ER+]: Secondary | ICD-10-CM

## 2020-10-26 DIAGNOSIS — C50411 Malignant neoplasm of upper-outer quadrant of right female breast: Secondary | ICD-10-CM

## 2020-10-26 NOTE — Progress Notes (Cosign Needed)
Patient is a 59 year old female here for follow-up after undergoing bilateral oncoplastic breast reduction with Dr. Marla Roe on 10/04/2020.  She is doing really well.  She reports that she believes she has radiation and chemotherapy, but she reports that she is not sure if she would like to move forward with chemotherapy or not.  She is going to have radiation.  Patient's incisions are intact.  Bilateral NAC's are viable.  No fluid wave noted with palpation. No cellulitic changes noted.  She does have a little bit of a maculopapular rash along the right medial breast, as well as some yellowing bruising.  We remove the honeycomb dressings today and some of the Steri-Strips. We discussed restrictions.  Recommend continue her compressive garment for 3 more weeks. No sign of infection, seroma, hematoma. Recommend Benadryl at night and or Allegra during the day for itching of left medial breast. Recommend using OTC steroid cream as needed for right breast rash. We discussed that radiation to the right breast could cause size change and skin tightening.  Recommend following up in 3 weeks for reevaluation.

## 2020-10-28 ENCOUNTER — Encounter: Payer: Self-pay | Admitting: Licensed Clinical Social Worker

## 2020-10-28 ENCOUNTER — Encounter: Payer: Self-pay | Admitting: Physical Therapy

## 2020-10-28 ENCOUNTER — Other Ambulatory Visit: Payer: Self-pay

## 2020-10-28 ENCOUNTER — Ambulatory Visit: Payer: 59 | Attending: General Surgery | Admitting: Physical Therapy

## 2020-10-28 DIAGNOSIS — R293 Abnormal posture: Secondary | ICD-10-CM | POA: Insufficient documentation

## 2020-10-28 DIAGNOSIS — Z483 Aftercare following surgery for neoplasm: Secondary | ICD-10-CM | POA: Insufficient documentation

## 2020-10-28 DIAGNOSIS — Z170 Estrogen receptor positive status [ER+]: Secondary | ICD-10-CM | POA: Insufficient documentation

## 2020-10-28 DIAGNOSIS — C50411 Malignant neoplasm of upper-outer quadrant of right female breast: Secondary | ICD-10-CM | POA: Diagnosis present

## 2020-10-28 NOTE — Patient Instructions (Addendum)
            [facility]         [address], [place] [address]         [phone]         Annia Friendly, PT, CLT   After Breast Cancer Class It is recommended you attend the ABC class to be educated on lymphedema risk reduction. This class is free of charge and lasts for 1 hour. It is a 1-time class.  You are scheduled for January 17th at 11:00. We will send you a link.  Scar massage You can begin scar massage with coconut oil to your incision in your right armpit a few minutes each day.   Compression garment Continue wearing your compression bra.   Home exercise Program Do your exercises until you no longer feel tightness at the end of your stretch.   Follow up PT: It is recommended you return every 3 months for the first 3 years following surgery to be assessed on the SOZO machine for an L-Dex score. This helps prevent clinically significant lymphedema in 95% of patients. These follow up screens are 15 minute appointments that you are not billed for.   Closed Chain: Shoulder Abduction / Adduction - on Wall    One hand on wall, step to side and return. Stepping causes shoulder to abduct and adduct. Step _5__ times, holding 5 seconds, _2__ times per day.  http://ss.exer.us/267   Copyright  VHI. All rights reserved.  Closed Chain: Shoulder Flexion / Extension - on Wall    Hands on wall, step backward. Return. Stepping causes shoulder flexion and extension Do _5__ times, holding 5 seconds, _2__ times per day.  http://ss.exer.us/265   Copyright  VHI. All rights reserved.

## 2020-10-28 NOTE — Therapy (Signed)
[facility] [address] [place], [place], [address] Phone: [phone]   Fax:  [phone]  Physical Therapy Treatment  Patient Details  Name: Cassandra Cox MRN: [id number] Date of Birth: 04/23/1961 Referring Provider (PT): Dr. Paul Toth   Encounter Date: 10/28/2020   PT End of Session - 10/28/20 1200    Visit Number 2    Number of Visits 2    PT Start Time 1105    PT Stop Time 1205    PT Time Calculation (min) 60 min    Activity Tolerance Patient tolerated treatment well    Behavior During Therapy WFL for tasks assessed/performed           Past Medical History:  Diagnosis Date  . Complication of anesthesia   . Coronary artery disease    a. 11/2016 Cath/PCI: LM nl, LAD 30ost, 99m (3.0x18 Resolute Integrity DES--3.75), LCX nl, RCA nl; b. MV 11/18: negative for ischemia, EF 72%, low risk  . Depression   . Diabetes mellitus without complication (HCC)   . Diastolic dysfunction    a. 11/2016 Echo: EF 55-60%, Gr2 DD, LAE, nl RV size/fxn.  . Hyperlipemia \[id number]\  . Kidney stone 08/2016  . MVP (mitral valve prolapse)   . PONV (postoperative nausea and vomiting)   . STEMI (ST elevation myocardial infarction) (HCC) 12/02/2016  . Vitamin B 12 deficiency     Past Surgical History:  Procedure Laterality Date  . ABDOMINAL HYSTERECTOMY  2003   TAH due to adenomyosis  . BREAST LUMPECTOMY WITH RADIOACTIVE SEED AND SENTINEL LYMPH NODE BIOPSY Right 10/04/2020   Procedure: RIGHT BREAST REDUCTION LUMPECTOMY WITH RADIOACTIVE SEED AND SENTINEL LYMPH NODE BIOPSY;  Surgeon: Toth, Paul III, MD;  Location: [facility];  Service: General;  Laterality: Right;  . BREAST REDUCTION SURGERY  1990  . BREAST REDUCTION SURGERY Bilateral 10/04/2020   Procedure: BILATERAL MAMMARY REDUCTION  (BREAST);  Surgeon: Dillingham, Claire S, DO;  Location: [facility];  Service: Plastics;  Laterality: Bilateral;  . CARDIAC  CATHETERIZATION N/A 12/02/2016   Procedure: Coronary Stent Intervention;  Surgeon: Christopher End, MD;  Location: [facility];  Service: Cardiovascular;  Laterality: N/A;  . CARDIAC CATHETERIZATION N/A 12/02/2016   Procedure: Left Heart Cath and Coronary Angiography;  Surgeon: Christopher End, MD;  Location: [facility];  Service: Cardiovascular;  Laterality: N/A;  . CARDIAC CATHETERIZATION N/A 12/02/2016   Procedure: Intravascular Ultrasound/IVUS;  Surgeon: Christopher End, MD;  Location: [facility];  Service: Cardiovascular;  Laterality: N/A;  . CORONARY STENT INTERVENTION    . MOUTH SURGERY     Dental implant    There were no vitals filed for this visit.   Subjective Assessment - 10/28/20 1114    Subjective Patient underwent a right lumpectomy and sentinel node biopsy (1/3 nodes positive) on 10/04/2020 with a bilateral reduction. She is unsure is she wants to do chemo and will see Dr. Magrinat on 11/19/2019.    Pertinent History Patient was diagnosed on 06/30/2020 with right grade III invasive ductal carcinoma breast cancer. Patient underwent a right lumpectomy and sentinel node biopsy (1/3 nodes positive) on 10/04/2020 with a bilateral reduction. It is ER/PR positive and HER2 negative with a Ki67 of 30%.    Patient Stated Goals See how my arm is doing.    Currently in Pain? Yes    Pain Score 5     Pain Location Arm    Pain Orientation Right;Proximal      Pain Descriptors / Indicators Tightness    Pain Type Surgical pain;Neuropathic pain    Pain Onset 1 to 4 weeks ago    Pain Frequency Intermittent    Aggravating Factors  Reaching behind    Pain Relieving Factors Not reaching behind              [facility] PT Assessment - 10/28/20 0001      Assessment   Medical Diagnosis s/p right lumpectomy and sentinel node biopsy with bilateral reduction    Referring Provider (PT) Dr. Autumn Messing    Onset Date/Surgical Date 10/04/20    Hand Dominance Right    Prior Therapy  Baselines      Precautions   Precautions Other (comment)    Precaution Comments recent surgery; right arm lymphedema risk      Restrictions   Weight Bearing Restrictions No      Balance Screen   Has the patient fallen in the past 6 months No    Has the patient had a decrease in activity level because of a fear of falling?  No    Is the patient reluctant to leave their home because of a fear of falling?  No      Home Environment   Living Environment Private residence    Living Arrangements Alone    Available Help at Discharge Family      Prior Function   Level of [place] Retired    Leisure She is not exercising      Cognition   Overall Cognitive Status Within Functional Limits for tasks assessed      Observation/Other Assessments   Observations Incisions appear to be healing very well. She had several long clear stitches sticking out on her right axilla and right nipple which she reported are bothering her. Assisted pt with cutting those with cleaned scissors (not too close to the skin but they were ~ 1/2" long). No significant edema present in breasts. Some mild tissue tightness at axillary incision. Mild redness present left medial breast which pt reports is due to the honeycomb being removed recently.      Posture/Postural Control   Posture/Postural Control Postural limitations    Postural Limitations Rounded Shoulders;Forward head      ROM / Strength   AROM / PROM / Strength AROM      AROM   AROM Assessment Site Shoulder    Right/Left Shoulder Right    Right Shoulder Extension 45 Degrees    Right Shoulder Flexion 131 Degrees    Right Shoulder ABduction 156 Degrees    Right Shoulder Internal Rotation 64 Degrees    Right Shoulder External Rotation 87 Degrees    Left Shoulder Extension 50 Degrees    Left Shoulder Flexion 140 Degrees    Left Shoulder ABduction 140 Degrees    Left Shoulder Internal Rotation 76 Degrees    Left Shoulder  External Rotation 90 Degrees      Strength   Overall Strength Within functional limits for tasks performed             LYMPHEDEMA/ONCOLOGY QUESTIONNAIRE - 10/28/20 0001      Type   Cancer Type Right breast cancer      Surgeries   Lumpectomy Date 10/04/20    Sentinel Lymph Node Biopsy Date 10/04/20    Number Lymph Nodes Removed 3      Treatment   Active Chemotherapy Treatment No    Past Chemotherapy Treatment No  Active Radiation Treatment No    Past Radiation Treatment No    Current Hormone Treatment No    Past Hormone Therapy No      What other symptoms do you have   Are you Having Heaviness or Tightness Yes    Are you having Pain Yes    Are you having pitting edema No    Is it Hard or Difficult finding clothes that fit No    Do you have infections No    Is there Decreased scar mobility Yes    [place] Sign No      Lymphedema Assessments   Lymphedema Assessments Upper extremities      Right Upper Extremity Lymphedema   10 cm Proximal to Olecranon Process 29.9 cm    Olecranon Process 25.7 cm    10 cm Proximal to Ulnar Styloid Process 23.4 cm    Just Proximal to Ulnar Styloid Process 16.2 cm    Across Hand at [employer] 20 cm    At [place] of 2nd Digit 6.4 cm      Left Upper Extremity Lymphedema   10 cm Proximal to Olecranon Process 30.5 cm    Olecranon Process 25.6 cm    10 cm Proximal to Ulnar Styloid Process 22.6 cm    Just Proximal to Ulnar Styloid Process 16.1 cm    Across Hand at [employer] 19.8 cm    At [place] of 2nd Digit 6.5 cm              Quick Dash - 10/28/20 0001    Open a tight or new jar Moderate difficulty    Do heavy household chores (wash walls, wash floors) Unable    Carry a shopping bag or briefcase Unable    Wash your back Moderate difficulty    Use a knife to cut food Mild difficulty    Recreational activities in which you take some force or impact through your arm, shoulder, or hand (golf, hammering, tennis) Unable     During the past week, to what extent has your arm, shoulder or hand problem interfered with your normal social activities with family, friends, neighbors, or groups? Slightly    During the past week, to what extent has your arm, shoulder or hand problem limited your work or other regular daily activities Slightly    Arm, shoulder, or hand pain. Mild    Tingling (pins and needles) in your arm, shoulder, or hand None    Difficulty Sleeping No difficulty    DASH Score 45.45 %                          PT Education - 10/28/20 1200    Education Details Lymphedema education; scar massage to axillary incision; HEP    Person(s) Educated Patient    Methods Explanation;Demonstration;Handout    Comprehension Returned demonstration;Verbalized understanding               PT Long Term Goals - 10/28/20 1214      PT LONG TERM GOAL #1   Title Patient will demonstrate she has regained full shoulder ROM and function post operatively compared to baselines.    Time 8    Period Weeks    Status On-going                 Plan - 10/28/20 1201    Clinical Impression Statement Patient is doing well s/p right lumpectomy and sentinel node  biopsy with bilateral reduction on 10/04/2020. Her ROM is nearly back to baseline, she has no signs of lymphedema, her incisions are healing well, and she reports she is doing well functionally. She wants to try the HEP on her own and get rechecked for ROM in 1 month. She will attend the ABC class on 11/22/2020 for lymphedema education.    PT Duration --   1 additional visit   PT Treatment/Interventions ADLs/Self Care Home Management;Therapeutic exercise;Patient/family education    PT Next Visit Plan Will reassess in 1 month to test ROM; if not back to baseline, may consider doing PT    PT Home Exercise Plan Post op shoulder ROM HEP    Consulted and Agree with Plan of Care Patient           Patient will benefit from skilled therapeutic  intervention in order to improve the following deficits and impairments:  Postural dysfunction,Decreased range of motion,Decreased knowledge of precautions,Impaired UE functional use  Visit Diagnosis: Malignant neoplasm of upper-outer quadrant of right breast in female, estrogen receptor positive ([place]) - Plan: PT plan of care cert/re-cert  Abnormal posture - Plan: PT plan of care cert/re-cert  Aftercare following surgery for neoplasm - Plan: PT plan of care cert/re-cert     Problem List Patient Active Problem List   Diagnosis Date Noted  . Malignant neoplasm of upper-outer quadrant of right breast in female, estrogen receptor positive ([place]) 08/13/2020  . Shortness of breath 02/19/2020  . Essential hypertension 09/09/2018  . Influenza B 10/18/2017  . Unstable angina ([place]) 09/14/2017  . Coronary artery disease involving native coronary artery of native heart without angina pectoris 07/18/2017  . Insomnia 08/25/2015  . Hot flashes, menopausal 08/25/2015  . Well woman exam 07/22/2015  . Family history of diabetes mellitus 07/15/2014  . Vitamin D deficiency 11/03/2009  . Hyperlipidemia LDL goal <70 11/03/2009  . ANXIETY 11/03/2009  . Depression 11/03/2009   Annia Friendly, PT 10/28/20 12:15 PM  [place], [place], [address] Phone: [phone]   Fax:  [phone]  Name: Cassandra Cox MRN: [id number] Date of Birth: 1961-04-26

## 2020-10-28 NOTE — Progress Notes (Signed)
[place] Work  Clinical Social Work received request from [employer] PT for [employer] guide referral for pt.  Clinical Social Worker contacted patient by phone  to assess for needs.  Patient interested in [employer] guide to talk with someone else about their experience with chemotherapy. She is also open to support group.  CSW made [employer] guide referral and added patient to list for breast cancer support group.     [place], [place] Worker [employer]

## 2020-11-08 ENCOUNTER — Inpatient Hospital Stay: Payer: 59

## 2020-11-10 LAB — SURGICAL PATHOLOGY

## 2020-11-11 ENCOUNTER — Ambulatory Visit: Payer: 59

## 2020-11-11 ENCOUNTER — Other Ambulatory Visit: Payer: 59

## 2020-11-11 ENCOUNTER — Ambulatory Visit: Payer: 59 | Admitting: Nurse Practitioner

## 2020-11-15 ENCOUNTER — Inpatient Hospital Stay: Payer: 59 | Attending: Genetic Counselor

## 2020-11-15 ENCOUNTER — Encounter: Payer: Self-pay | Admitting: Oncology

## 2020-11-15 ENCOUNTER — Other Ambulatory Visit: Payer: Self-pay

## 2020-11-15 ENCOUNTER — Other Ambulatory Visit: Payer: Self-pay | Admitting: Genetic Counselor

## 2020-11-15 ENCOUNTER — Inpatient Hospital Stay: Payer: 59 | Admitting: Genetic Counselor

## 2020-11-15 ENCOUNTER — Inpatient Hospital Stay: Payer: 59

## 2020-11-15 DIAGNOSIS — Z170 Estrogen receptor positive status [ER+]: Secondary | ICD-10-CM

## 2020-11-15 DIAGNOSIS — Z803 Family history of malignant neoplasm of breast: Secondary | ICD-10-CM | POA: Insufficient documentation

## 2020-11-15 DIAGNOSIS — C50411 Malignant neoplasm of upper-outer quadrant of right female breast: Secondary | ICD-10-CM | POA: Insufficient documentation

## 2020-11-15 DIAGNOSIS — Z90710 Acquired absence of both cervix and uterus: Secondary | ICD-10-CM | POA: Insufficient documentation

## 2020-11-15 DIAGNOSIS — C773 Secondary and unspecified malignant neoplasm of axilla and upper limb lymph nodes: Secondary | ICD-10-CM | POA: Insufficient documentation

## 2020-11-15 NOTE — Progress Notes (Signed)
Met with patient at registration to introduce myself as Arboriculturist and to offer available resources.  Discussed one-time $1000 Radio broadcast assistant to assist with personal expenses while going through treatment. Also, discussed available copay assistance for injection which will assist with copay after insurance pays.  Gave her my card if interested in applying and for any additional financial questions or concerns.

## 2020-11-16 ENCOUNTER — Telehealth: Payer: Self-pay | Admitting: Oncology

## 2020-11-16 NOTE — Progress Notes (Signed)
.  The following biosimilar Ziextenzo ([id number]) has been selected for use in this patient per insurance.  Henreitta Leber, PharmD

## 2020-11-16 NOTE — Telephone Encounter (Signed)
rescheduled pt per 1/10 sch msg - left message for pt with new appt date and time

## 2020-11-17 ENCOUNTER — Encounter: Payer: Self-pay | Admitting: Licensed Clinical Social Worker

## 2020-11-17 ENCOUNTER — Inpatient Hospital Stay (HOSPITAL_BASED_OUTPATIENT_CLINIC_OR_DEPARTMENT_OTHER): Payer: 59 | Admitting: Licensed Clinical Social Worker

## 2020-11-17 DIAGNOSIS — Z170 Estrogen receptor positive status [ER+]: Secondary | ICD-10-CM

## 2020-11-17 DIAGNOSIS — C50411 Malignant neoplasm of upper-outer quadrant of right female breast: Secondary | ICD-10-CM

## 2020-11-17 DIAGNOSIS — Z803 Family history of malignant neoplasm of breast: Secondary | ICD-10-CM | POA: Diagnosis not present

## 2020-11-17 NOTE — Progress Notes (Signed)
[place]  [phone] [phone]     ID: Cassandra Cox DOB: 12-13-1960  MR#: [id number]  OIZ#:[id number]  Patient Care Team: Pcp, No as PCP - General End, Harrell Gave, MD as PCP - Cardiology (Cardiology) Mauro Kaufmann, RN as Oncology Nurse Navigator Rockwell Germany, RN as Oncology Nurse Navigator Jovita Kussmaul, MD as Consulting Physician (General Surgery) Truitt Merle, MD as Consulting Physician (Hematology) Kyung Rudd, MD as Consulting Physician (Radiation Oncology) Chauncey Cruel, MD OTHER MD:  CHIEF COMPLAINT: estrogen receptor positive breast cancer  CURRENT TREATMENT: adjuvant chemotherapy pending   HISTORY OF CURRENT ILLNESS: Cassandra Cox had routine screening mammography on 06/30/2020 showing a possible abnormality in the right breast. She underwent right diagnostic mammography with tomography and right breast ultrasonography at [facility] on 07/20/2020 showing: breast density category C; 1.1 cm irregular mass in right breast at 10-11 o'clock, better seen on mammogram; no suspicious right axillary lymph nodes.  Accordingly on 08/09/2020 she proceeded to biopsy of the right breast area in question. The pathology from this procedure  showed: invasive and in situ mammary carcinoma, e-cadherin positive, grade 3. Prognostic indicators significant for: estrogen receptor, 95% positive and progesterone receptor, 90% positive. Proliferation marker Ki67 at 30%. HER2 negative by immunohistochemistry.  She underwent right lumpectomy on 10/04/2020 under Dr. Marlou Starks. Pathology from the procedure [phone]) showed: invasive ductal carcinoma, grade 2, 1.2 cm; ductal carcinoma in situ, intermediate grade; calcifications associated with carcinoma; margins uninvolved; lymphovascular space invasion present.  Out of three biopsied lymph nodes, one revealed metastatic carcinoma (1/3). The positive lymph node showed a disrupted capsule that is suspicious for  extracapsular extension.  Mammaprint was performed on the final surgical sample and revealed high risk.  The patient's subsequent history is as detailed below.   INTERVAL HISTORY: Cassandra Cox was evaluated in the multidisciplinary breast cancer clinic on 08/18/2020 by Dr. Elfredia Nevins refer to her note.  Patient's case was also presented at the multidisciplinary breast cancer conference 10/20/2020.  At that point it was felt she was done with surgery, would benefit from adjuvant chemotherapy and adjuvant radiation in addition to antiestrogens.  She underwent genetic counseling yesterday, 11/17/2020. Results are pending.  She is scheduled to begin adjuvant chemotherapy, consisting of docetaxel and cyclophosphamide x4, on 11/24/2020 but she has been very ambivalent regarding this, has not had her port scheduled, and tells me she does not think she can be treated before February.  She has been referred to Dr. Dagmar Hait for sleep study scheduled 12/07/2020 and also for physical therapy postop   REVIEW OF SYSTEMS: Cassandra Cox "does not feel well" but does not have specific symptoms.  In particular she denies severe or persistent headaches sudden changes in her vision, nausea, vomiting, new balance issues or falls, persistent cough phlegm production or pleurisy, fever, rash, bleeding, or bruising, or any unexplained fatigue or unexplained weight loss.  There has been no change in bowel or bladder habits.  She denies pain.   COVID 19 VACCINATION STATUS: Refuses vaccination   PAST MEDICAL HISTORY: Past Medical History:  Diagnosis Date  . Complication of anesthesia   . Coronary artery disease    a. 11/2016 Cath/PCI: LM nl, LAD 30ost, 60m(3.0x18 Resolute Integrity DES--3.75), LCX nl, RCA nl; b. MV 11/18: negative for ischemia, EF 72%, low risk  . Depression   . Diabetes mellitus without complication ([place]   . Diastolic dysfunction    a. 11/2016 Echo: EF 55-60%, Gr2 DD, LAE, nl RV size/fxn.  .Marland Kitchen  Family history of  breast cancer   . Hyperlipemia \[id number]\  . Kidney stone 08/2016  . MVP (mitral valve prolapse)   . PONV (postoperative nausea and vomiting)   . STEMI (ST elevation myocardial infarction) ([place]) 12/02/2016  . Vitamin B 12 deficiency     PAST SURGICAL HISTORY: Past Surgical History:  Procedure Laterality Date  . ABDOMINAL HYSTERECTOMY  2003   TAH due to adenomyosis  . BREAST LUMPECTOMY WITH RADIOACTIVE SEED AND SENTINEL LYMPH NODE BIOPSY Right 10/04/2020   Procedure: RIGHT BREAST REDUCTION LUMPECTOMY WITH RADIOACTIVE SEED AND SENTINEL LYMPH NODE BIOPSY;  Surgeon: Jovita Kussmaul, MD;  Location: [place];  Service: General;  Laterality: Right;  . BREAST REDUCTION SURGERY  1990  . BREAST REDUCTION SURGERY Bilateral 10/04/2020   Procedure: BILATERAL MAMMARY REDUCTION  (BREAST);  Surgeon: Wallace Going, DO;  Location: [place];  Service: Plastics;  Laterality: Bilateral;  . CARDIAC CATHETERIZATION N/A 12/02/2016   Procedure: Coronary Stent Intervention;  Surgeon: Nelva Bush, MD;  Location: [place] [facility];  Service: Cardiovascular;  Laterality: N/A;  . CARDIAC CATHETERIZATION N/A 12/02/2016   Procedure: Left Heart Cath and Coronary Angiography;  Surgeon: Nelva Bush, MD;  Location: [place] [facility];  Service: Cardiovascular;  Laterality: N/A;  . CARDIAC CATHETERIZATION N/A 12/02/2016   Procedure: Intravascular Ultrasound/IVUS;  Surgeon: Nelva Bush, MD;  Location: [place] [facility];  Service: Cardiovascular;  Laterality: N/A;  . CORONARY STENT INTERVENTION    . MOUTH SURGERY     Dental implant    FAMILY HISTORY: Family History  Problem Relation Age of Onset  . Heart disease Mother   . Diabetes Mother   . Stroke Mother   . Heart attack Mother   . Atrial fibrillation Mother   . Hypertension Mother   . Hyperlipidemia Mother   . COPD Father   . Hypertension Father   . Hyperlipidemia Father   . Breast cancer Maternal Aunt  44       x2 age 89  . Diabetes Maternal Grandfather    Her father died at age 50 and her mother at age 51. Jett is an only child. She reports breast cancer in a maternal aunt two separate times, in her 27's and then in her 71's.   GYNECOLOGIC HISTORY:  No LMP recorded. Patient has had a hysterectomy. Menarche: 60 years old Age at first live birth: 60 years old [place] P 2 LMP 07/1997 (having periods at time of hysterectomy) Contraceptive: used oral contraceptives for 17 years without complications ([id number]) HRT used progesterone for 2-3 years, stopped with cancer diagnosis Hysterectomy? Yes, 1998 for adenomyosis BSO? 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 She lives by herself with he dachshund. Daughter Carmell [place] is 89, lives in [place], and is a homemaker; she suffers from [employer]. Daughter Tanzania is 10, lives in [place] and works as a Psychologist, educational.  The patient has 1 grandchild.  She is a Psychologist, forensic.    ADVANCED DIRECTIVES:    HEALTH MAINTENANCE: Social History   Tobacco Use  . Smoking status: Never Smoker  . Smokeless tobacco: Never Used  Vaping Use  . Vaping Use: Never used  Substance Use Topics  . Alcohol use: No  . Drug use: No     Colonoscopy:   PAP:   Bone density:    Allergies  Allergen Reactions  . Prednisone Other (See Comments)    REACTION: Swelling  REACTION: Swelling Facial swelling REACTION: Swelling  . Sulfa Antibiotics Hives and Other (See Comments)    REACTION: Hives  . Sulfasalazine     REACTION: Hives  . Sulfonamide Derivatives     REACTION: Hives    Current Outpatient Medications  Medication Sig Dispense Refill  . ALPRAZolam (XANAX) 0.25 MG tablet Take 0.25 mg by mouth in the morning, at noon, in the evening, and at bedtime.    Francia Greaves THYROID 60 MG tablet Take 60 mg by mouth daily.    Marland Kitchen aspirin EC 81 MG tablet Take 81 mg by mouth daily.    Marland Kitchen atorvastatin (LIPITOR) 80  MG tablet TAKE 1 TABLET BY MOUTH DAILY AT 6PM GENERIC EQUIVALENT FOR LIPITOR 90 tablet 3  . buPROPion (WELLBUTRIN XL) 150 MG 24 hr tablet Take 300 mg by mouth daily.     . Cholecalciferol (VITAMIN D-3) 5000 UNITS TABS Take 1 tablet by mouth daily.    . clopidogrel (PLAVIX) 75 MG tablet TAKE 1 TABLET BY MOUTH DAILY GENERIC EQUIVALENT FOR PLAVIX 90 tablet 3  . Cyanocobalamin 1000 MCG/ML LIQD Inject 1 mL as directed once a week.    Marland Kitchen dexamethasone (DECADRON) 4 MG tablet Take 1 tablet (4 mg total) by mouth 2 (two) times daily. Start the day before Taxotere. Then 1 tab daily the day after chemo for 2 days. 30 tablet 1  . ezetimibe (ZETIA) 10 MG tablet TAKE 1 TABLET BY MOUTH DAILY. 90 tablet 3  . FLUoxetine (PROZAC) 20 MG capsule Take 20 mg by mouth daily.     Marland Kitchen ketoconazole (NIZORAL) 2 % cream Apply 1 application topically daily. 15 g 0  . levocetirizine (XYZAL) 5 MG tablet Take 1 tablet at bedtime by mouth.  2  . liothyronine (CYTOMEL) 5 MCG tablet Take 5 mcg by mouth every morning.     . magnesium gluconate (MAGONATE) 500 MG tablet Take 1,000 mg by mouth at bedtime.    . metFORMIN (GLUCOPHAGE-XR) 500 MG 24 hr tablet Take 500 mg by mouth 2 (two) times daily.    . montelukast (SINGULAIR) 10 MG tablet Take 10 mg by mouth at bedtime.    Marland Kitchen NALTREXONE HCL PO Take 4 mg by mouth at bedtime.     . nitroGLYCERIN (NITROSTAT) 0.4 MG SL tablet Place 1 tablet (0.4 mg total) under the tongue every 5 (five) minutes as needed for chest pain. For maximum of 3 doses. (Patient not taking: Reported on 11/18/2020) 25 tablet 3  . ondansetron (ZOFRAN) 4 MG tablet Take 1 tablet (4 mg total) by mouth every 8 (eight) hours as needed for nausea or vomiting. 20 tablet 0  . ondansetron (ZOFRAN) 8 MG tablet Take 1 tablet (8 mg total) by mouth 2 (two) times daily as needed for refractory nausea / vomiting. Start on day 3 after chemo. 30 tablet 1  . prochlorperazine (COMPAZINE) 10 MG tablet Take 1 tablet (10 mg total) by mouth every 6  (six) hours as needed (Nausea or vomiting). 30 tablet 1  . zolpidem (AMBIEN CR) 12.5 MG CR tablet Take 12.5 mg by mouth at bedtime as needed for sleep.     No current facility-administered medications for this visit.    OBJECTIVE: White Cox who appears stated age  30:   11/18/20 1216  BP: 138/72  Pulse: 89  Resp: 17  Temp: 97.7 F (36.5 C)  SpO2: 99%     Body mass index is 32.52 kg/m.   Wt Readings from Last 3 Encounters:  11/18/20 183 lb 9.6 oz (83.3 kg)  10/20/20 183 lb 12.8 oz (83.4 kg)  10/04/20 185 lb 6.5 oz (84.1 kg)      ECOG FS:1 - Symptomatic but completely ambulatory  Ocular: Sclerae unicteric, pupils round and equal Ear-nose-throat: Wearing a mask Lymphatic: No cervical or supraclavicular adenopathy Lungs no rales or rhonchi Heart regular rate and rhythm Abd soft, nontender, positive bowel sounds MSK no focal spinal tenderness, no joint edema Neuro: non-focal, well-oriented, appropriate affect Breasts: Status post bilateral breast reduction as well as right lumpectomy and sentinel lymph node sampling.  The cosmetic result is good.  There is no evidence of residual or recurrent disease.  Both axillae are benign.   LAB RESULTS:  CMP     Component Value Date/Time   NA 139 09/28/2020 1500   NA 140 03/03/2020 1052   K 4.3 09/28/2020 1500   CL 103 09/28/2020 1500   CO2 26 09/28/2020 1500   GLUCOSE 159 (H) 09/28/2020 1500   BUN 14 09/28/2020 1500   BUN 18 03/03/2020 1052   CREATININE 0.84 09/28/2020 1500   CREATININE 0.72 08/18/2020 1202   CALCIUM 9.7 09/28/2020 1500   PROT 7.1 08/18/2020 1202   PROT 6.5 03/03/2020 1052   ALBUMIN 3.7 08/18/2020 1202   ALBUMIN 4.4 03/03/2020 1052   AST 18 08/18/2020 1202   ALT 25 08/18/2020 1202   ALKPHOS 103 08/18/2020 1202   BILITOT 0.6 08/18/2020 1202   GFRNONAA >60 09/28/2020 1500   GFRNONAA >60 08/18/2020 1202   GFRAA 111 03/03/2020 1052    No results found for: TOTALPROTELP, ALBUMINELP, A1GS, A2GS,  BETS, BETA2SER, GAMS, MSPIKE, SPEI  Lab Results  Component Value Date   WBC 6.8 08/18/2020   NEUTROABS 4.0 08/18/2020   HGB 12.1 08/18/2020   HCT 38.2 08/18/2020   MCV 92.7 08/18/2020   PLT 315 08/18/2020    No results found for: LABCA2  No components found for: XBDZHG992  No results for input(s): INR in the last 168 hours.  No results found for: LABCA2  No results found for: EQA834  No results found for: HDQ222  No results found for: LNL892  No results found for: CA2729  No components found for: HGQUANT  No results found for: CEA1 / No results found for: CEA1   No results found for: AFPTUMOR  No results found for: CHROMOGRNA  No results found for: KPAFRELGTCHN, LAMBDASER, KAPLAMBRATIO (kappa/lambda light chains)  No results found for: HGBA, HGBA2QUANT, HGBFQUANT, HGBSQUAN (Hemoglobinopathy evaluation)   No results found for: LDH  No results found for: IRON, TIBC, IRONPCTSAT (Iron and TIBC)  No results found for: FERRITIN  Urinalysis    Component Value Date/Time   COLORURINE RED (A) 08/28/2016 1428   APPEARANCEUR CLOUDY (A) 08/28/2016 1428   LABSPEC 1.017 08/28/2016 1428   PHURINE 7.0 08/28/2016 1428   GLUCOSEU NEGATIVE 08/28/2016 1428   HGBUR LARGE (A) 08/28/2016 1428   BILIRUBINUR NEG 09/25/2017 1443   KETONESUR 15 (A) 08/28/2016 1428   PROTEINUR NEG 09/25/2017 1443   PROTEINUR NEGATIVE 08/28/2016 1428   UROBILINOGEN negative 06/12/2016 1610   NITRITE NEG 09/25/2017 1443   NITRITE NEGATIVE 08/28/2016 1428   LEUKOCYTESUR Negative 09/25/2017 1443     STUDIES: No results found.   ELIGIBLE FOR AVAILABLE RESEARCH PROTOCOL: AET  ASSESSMENT: 60 y.o. Cassandra Cox status post right lumpectomy and sentinel lymph node sampling 10/04/2020 for a pT1c pN1, stage IB   invasive ductal carcinoma, grade 2, estrogen and progesterone receptor positive, HER2 not amplified,  with an MIB-1 of 30%  (a) a total of 3 right axillary lymph nodes removed, one positive  with concern regarding extracapsular extension  (1) MammaPrint high risk predicts a significant benefit from chemotherapy  (2) adjuvant chemotherapy will consist of cyclophosphamide and docetaxel every 21 days x 4 starting 12/14/2020  (3) adjuvant radiation to follow.  (4) antiestrogens to start at the completion of local treatment  PLAN: I met today with Cassandra Cox to review her new diagnosis. Specifically we discussed the biology of her breast cancer, its diagnosis, staging, treatment  options and prognosis. We first reviewed the fact that cancer is not one disease but more than 100 different diseases and that it is important to keep them separate-- otherwise when friends and relatives discuss their own cancer experiences with Nomie confusion can result. Similarly we explained that if breast cancer spreads to the bone or liver, the patient would not have bone cancer or liver cancer, but breast cancer in the bone and breast cancer in the liver: one cancer in three places-- not 3 different cancers which otherwise would have to be treated in 3 different ways.  We discussed the difference between local and systemic therapy. In terms of loco-regional treatment, lumpectomy plus radiation is equivalent to mastectomy as far as survival is concerned so I commended her choice of surgical treatment.  Next we went over the options for systemic therapy which are anti-estrogens, anti-HER-2 immunotherapy, and chemotherapy. Cassandra Cox does not meet criteria for anti-HER-2 immunotherapy. She is a good candidate for anti-estrogens.  The question of chemotherapy is more complicated. Chemotherapy is most effective in rapidly growing, aggressive tumors. It is much less effective in low-grade, slow growing cancers. Missey 's tumor is somewhere in between, which is the reason we obtained a MammaPrint report.  This came back "high risk" indicating that she will get significant benefit from chemotherapy.  Dr. Burr Medico specifically  discussed cyclophosphamide and docetaxel with the patient and I agree with that recommendation.  Today we reviewed the possible toxicities side effects and complications of these agents including the rare possibility of permanent alopecia.  We discussed the Dignicap detail and the patient was encouraged to proceed to obtain that if she is interested as she has time to do so.  She is also aware of the rare risk of permanent neuropathy from these agents.   The patient has a sleep study scheduled for the first week in February and does not think she can start chemotherapy before then.  In addition she still does not have a port in place and I do recommend that she obtain one.  After our discussion she appeared agreeable to proceeding with chemotherapy.  She is scheduled for 12/14/2020.  She will see me that morning to make sure everything is in place.  Once she completes the chemotherapy she will proceed to radiation and after that of course antiestrogens  Agam has a good understanding of the overall plan. She remains somewhat tentative but seems to agree with it. She knows the goal of treatment in her case is cure. She will call with any problems that may develop before her next visit here.  Total encounter time 65 minutes.Sarajane Jews C. Marshall Roehrich, MD 11/22/2020 4:14 PM Medical Oncology and Hematology [facility] [place], [place] [address] Tel. [phone]    Fax. [phone]   This document serves as a record of services personally performed by Lurline Del, MD. It was created on his behalf by Joellen Jersey  [place], a trained medical scribe. The creation of this record is based on the scribe's personal observations and the provider's statements to them.   I, Lurline Del MD, have reviewed the above documentation for accuracy and completeness, and I agree with the above.    *Total Encounter Time as defined by the Centers for Medicare and Medicaid Services  includes, in addition to the face-to-face time of a patient visit (documented in the note above) non-face-to-face time: obtaining and reviewing outside history, ordering and reviewing medications, tests or procedures, care coordination (communications with other health care professionals or caregivers) and documentation in the medical record.

## 2020-11-17 NOTE — Progress Notes (Signed)
REFERRING PROVIDER: Truitt Merle, MD [place],  [place] [address]  PRIMARY PROVIDER:  Pcp, No  PRIMARY REASON FOR VISIT:  1. Malignant neoplasm of upper-outer quadrant of right breast in female, estrogen receptor positive ([place])   2. Family history of breast cancer    I connected with Ms. Salameh on 11/17/2020 at 10:00 AM EDT by MyChart video conference and verified that I am speaking with the correct person using two identifiers.    Patient location: home Provider location: [place]:   Ms. Gibb, a 60 y.o. female, was seen for a [place] cancer genetics consultation at the request of Dr. Burr Medico due to a personal and family history of cancer.  Ms. Stammen presents to clinic today to discuss the possibility of a hereditary predisposition to cancer, genetic testing, and to further clarify her future cancer risks, as well as potential cancer risks for family members.   In 2021, at the age of 50, Ms. Piotrowski was diagnosed with invasive dcutal carcinoma of the right breast, ER/PR+, Her2-. The treatment plan included lumpectomy and she will start chemotherapy soon.   CANCER HISTORY:  Oncology History Overview Note  Cancer Staging Malignant neoplasm of upper-outer quadrant of right breast in female, estrogen receptor positive ([place]) Staging form: Breast, AJCC 8th Edition - Clinical stage from 08/18/2020: Stage IA (cT1c, cN0, cM0, G3, ER+, PR+, HER2-) - Unsigned    Malignant neoplasm of upper-outer quadrant of right breast in female, estrogen receptor positive ([place])  07/20/2020 Mammogram   IMPRESSION The 1.1 cm irregular mass in the uper outer right breast, 10-11:00 position, 7cm form the nipple is indeterminate.    08/09/2020 Initial Biopsy   Diagnosis 08/09/20 Breast, right, needle core biopsy -INVASIVE MAMMARY CARCINOMA -MAMMARY CARCINOMA IN SITU -CALCIFICATIONS -SEE COMMENT   -Grade 3  E-cadherin is POSITIVE supporting a ductal  origin   08/09/2020 Receptors her2   ER 95% PR - 90% Ki67 - 30% HER2 NEGATIVE   08/13/2020 Initial Diagnosis   Malignant neoplasm of upper-outer quadrant of right breast in female, estrogen receptor positive ([place])   10/04/2020 Cancer Staging   Staging form: Breast, AJCC 8th Edition - Pathologic stage from 10/04/2020: Stage IA (pT1c, pN1a, cM0, G2, ER+, PR+, HER2-) - Signed by Truitt Merle, MD on 10/20/2020   10/04/2020 Surgery   RIGHT BREAST REDUCTION LUMPECTOMY WITH RADIOACTIVE SEED AND SENTINEL LYMPH NODE BIOPSY and BILATERAL MAMMARY REDUCTION  (BREAST) by Dr Marlou Starks and Dr Marla Roe    10/04/2020 Pathology Results   FINAL MICROSCOPIC DIAGNOSIS:   A. LYMPH NODE, RIGHT AXILLARY SENTINEL, BIOPSY:  -  No carcinoma identified in one lymph node (0/1)   B. LYMPH NODE, RIGHT AXILLARY SENTINEL, BIOPSY  -  Metastatic carcinoma involving one lymph node (1/1)  -  No extracapsular extension identified   C. LYMPH NODE, RIGHT AXILLARY SENTINEL #2, BIOPSY:  -  No carcinoma identified in one lymph node (0/1)   D. BREAST, RIGHT, LUMPECTOMY:  -  Invasive ductal carcinoma, Nottingham grade 2 of 3, 1.2 cm  -  Ductal carcinoma in-situ, intermediate grade  -  Calcifications associated with carcinoma  -  Margins uninvolved by carcinoma (<0.1 cm; medial)  -  Lymphovascular space invasion present  -  Previous biopsy site changes present  -  See oncology table and comment below   E. BREAST, RIGHT DEEP MARGIN, EXCISION:  -  Residual invasive ductal carcinoma  -  Margin uninvolved by carcinoma (0.1 cm; posterior)  F. BREAST, RIGHT SUPERIOR MARGIN, EXCISION:  -  Benign adenosis with calcifications  -  No residual carcinoma identified   G. BREAST, RIGHT INFERIOR LATERAL MARGIN, EXCISION:  -  Focal lobular neoplasia, adenosis and fibrocystic changes with  calcifications  -  No residual carcinoma identified   H. SKIN, RIGHT BREAST, EXCISION:  -  Benign skin and subcutaneous tissue with chronic  inflammation  -  No malignancy identified   I. BREAST, RIGHT SUPERIOR NIPPLE, BIOPSY:  -  Benign breast tissue  -  No malignancy identified   J. BREAST, RIGHT LATERAL, EXCISION:  -  Adenosis and fibrocystic changes with calcifications  -  No residual carcinoma identified   K. BREAST, LEFT, MAMMOPLASTY:  -  Adenosis, columnar cell and fibrocystic changes with calcifications  -  No malignancy identified    11/24/2020 -  Chemotherapy    Patient is on Treatment Plan: BREAST TC Q21D         RISK FACTORS:  Menarche was at age 72.  First live birth at age 59.  OCP use for approximately 17 years.  Ovaries intact: yes.  Hysterectomy: yes.  Menopausal status: postmenopausal.  HRT use: 3 years. Colonoscopy: yes; normal. Mammogram within the last year: yes. Number of breast biopsies: 1.   Past Medical History:  Diagnosis Date  . Complication of anesthesia   . Coronary artery disease    a. 11/2016 Cath/PCI: LM nl, LAD 30ost, 61m(3.0x18 Resolute Integrity DES--3.75), LCX nl, RCA nl; b. MV 11/18: negative for ischemia, EF 72%, low risk  . Depression   . Diabetes mellitus without complication ([facility]   . Diastolic dysfunction    a. 11/2016 Echo: EF 55-60%, Gr2 DD, LAE, nl RV size/fxn.  . Family history of breast cancer   . Hyperlipemia \\[id number]>_9<\/[id number]>_1 . Kidney stone 08/2016  . MVP (mitral valve prolapse)   . PONV (postoperative nausea and vomiting)   . STEMI (ST elevation myocardial infarction) ([facility] 12/02/2016  . Vitamin B 12 deficiency     Past Surgical History:  Procedure Laterality Date  . ABDOMINAL HYSTERECTOMY  2003   TAH due to adenomyosis  . BREAST LUMPECTOMY WITH RADIOACTIVE SEED AND SENTINEL LYMPH NODE BIOPSY Right 10/04/2020   Procedure: RIGHT BREAST REDUCTION LUMPECTOMY WITH RADIOACTIVE SEED AND SENTINEL LYMPH NODE BIOPSY;  Surgeon: TJovita Kussmaul MD;  [facility]  Service: General;  Laterality: Right;  . BREAST REDUCTION SURGERY  1990  .  BREAST REDUCTION SURGERY Bilateral 10/04/2020   Procedure: BILATERAL MAMMARY REDUCTION  (BREAST);  Surgeon: DWallace Going DO;  [facility]  Service: Plastics;  Laterality: Bilateral;  . CARDIAC CATHETERIZATION N/A 12/02/2016   Procedure: Coronary Stent Intervention;  Surgeon: CNelva Bush MD;  [facility];  Service: Cardiovascular;  Laterality: N/A;  . CARDIAC CATHETERIZATION N/A 12/02/2016   Procedure: Left Heart Cath and Coronary Angiography;  Surgeon: CNelva Bush MD;  [facility];  Service: Cardiovascular;  Laterality: N/A;  . CARDIAC CATHETERIZATION N/A 12/02/2016   Procedure: Intravascular Ultrasound/IVUS;  Surgeon: CNelva Bush MD;  [facility];  Service: Cardiovascular;  Laterality: N/A;  . CORONARY STENT INTERVENTION    . MOUTH SURGERY     Dental implant    Social History   Socioeconomic History  . Marital status: Divorced    Spouse name: Not on file  . Number of children: Not on file  . Years of education: Not on file  .  Highest education level: Not on file  Occupational History    Employer: [employer]  Tobacco Use  . Smoking status: Never Smoker  . Smokeless tobacco: Never Used  Vaping Use  . Vaping Use: Never used  Substance and Sexual Activity  . Alcohol use: No  . Drug use: No  . Sexual activity: Yes    Birth control/protection: Surgical  Other Topics Concern  . Not on file  Social History Narrative  . Not on file   Social Determinants of Health   Financial Resource Strain: Not on file  Food Insecurity: Not on file  Transportation Needs: Not on file  Physical Activity: Not on file  Stress: Not on file  Social Connections: Not on file     FAMILY HISTORY:  We obtained a detailed, 4-generation family history.  Significant diagnoses are listed below: Family History  Problem Relation Age of Onset  . Heart disease Mother   . Diabetes Mother    . Stroke Mother   . Heart attack Mother   . Atrial fibrillation Mother   . Hypertension Mother   . Hyperlipidemia Mother   . COPD Father   . Hypertension Father   . Hyperlipidemia Father   . Breast cancer Maternal Aunt 49       x2 age 11  . Diabetes Maternal Grandfather    Ms. Viles has 2 daughters. She does not have siblings.  Ms. Ravert mother died at 54, no cancer. Patient had 4 maternal aunts, 1 uncle. One aunt had breast cancer two separate times, in her 75s and then in her 92s. She is living at 34. No known cousins with cancer. Grandparents passed in their 70s-80s.  Ms. Desrocher father died at 98. Patient has 1 paternal uncle and 1 paternal aunt. No known cancers on this side of the family. Paternal grandmother passed at 29 due to a brain aneurysm. Grandfather died in his 10s.   Ms. Korber is unaware of previous family history of genetic testing for hereditary cancer risks. Patient's maternal ancestors are of European descent, and paternal ancestors are of European descent. There is no reported Ashkenazi Jewish ancestry. There is no known consanguinity.    GENETIC COUNSELING ASSESSMENT: Ms. Armwood is a 60 y.o. female with a personal and family history of breast cancer which is somewhat suggestive of a hereditary cancer syndrome and predisposition to cancer. We, therefore, discussed and recommended the following at today's visit.   DISCUSSION: We discussed that approximately 5-10% of breast cancer is hereditary  Most cases of hereditary breast cancer are associated with BRCA1/BRCA2 genes, although there are other genes associated with hereditary cancer as well.  We discussed that testing is beneficial for several reasons including  knowing about other cancer risks, identifying potential screening and risk-reduction options that may be appropriate, and to understand if other family members could be at risk for cancer and allow them to undergo genetic testing.   We reviewed the  characteristics, features and inheritance patterns of hereditary cancer syndromes. We also discussed genetic testing, including the appropriate family members to test, the process of testing, insurance coverage and turn-around-time for results. We discussed the implications of a negative, positive and/or variant of uncertain significant result. We recommended Ms. Pedregon pursue genetic testing for the [facility] CancerNext-Expanded gene panel.   The CancerNext-Expanded + RNAinsight gene panel offered by [employer] and includes sequencing and rearrangement analysis for the following 77 genes: IP, ALK, APC*, ATM*, AXIN2, BAP1, BARD1, BLM, BMPR1A, BRCA1*, BRCA2*,  BRIP1*, CDC73, CDH1*,CDK4, CDKN1B, CDKN2A, CHEK2*, CTNNA1, DICER1, FANCC, FH, FLCN, GALNT12, KIF1B, LZTR1, MAX, MEN1, MET, MLH1*, MSH2*, MSH3, MSH6*, MUTYH*, NBN, NF1*, NF2, NTHL1, PALB2*, PHOX2B, PMS2*, POT1, PRKAR1A, PTCH1, PTEN*, RAD51C*, RAD51D*,RB1, RECQL, RET, SDHA, SDHAF2, SDHB, SDHC, SDHD, SMAD4, SMARCA4, SMARCB1, SMARCE1, STK11, SUFU, TMEM127, TP53*,TSC1, TSC2, VHL and XRCC2 (sequencing and deletion/duplication); EGFR, EGLN1, HOXB13, KIT, MITF, PDGFRA, POLD1 and POLE (sequencing only); EPCAM and GREM1 (deletion/duplication only).   Based on Ms. Mosquera's personal and family history of cancer, she meets medical criteria for genetic testing. Despite that she meets criteria, she may still have an out of pocket cost. We discussed that if her out of pocket cost for testing is over $100, the laboratory will call and confirm whether she wants to proceed with testing.  If the out of pocket cost of testing is less than $100 she will be billed by the genetic testing laboratory.   PLAN: After considering the risks, benefits, and limitations, Ms. Matusik provided informed consent to pursue genetic testing and the blood sample was sent to [facility] for analysis of the CancerNext-Expanded panel. Results should be available within approximately 2-3 weeks'  time, at which point they will be disclosed by telephone to Ms. Laubach, as will any additional recommendations warranted by these results. Ms. Lumbra will receive a summary of her genetic counseling visit and a copy of her results once available. This information will also be available in Epic.   Ms. Obrien questions were answered to her satisfaction today. Our contact information was provided should additional questions or concerns arise. Thank you for the referral and allowing [place] to share in the care of your patient.   Faith Rogue, MS, [facility] Genetic Counselor [email or web address] Phone: [phone]  The patient was seen for a total of 30 minutes in face-to-face genetic counseling. Patient's daughter Carmell Austria was also present via telephone.  Dr. Grayland Ormond was available for discussion regarding this case.   _______________________________________________________________________ For Office Staff:  Number of people involved in session: 2 Was an Intern/ student involved with case: no

## 2020-11-17 NOTE — Progress Notes (Signed)
Pharmacist Chemotherapy Monitoring - Initial Assessment    Anticipated start date: 09/24/21  Regimen:  . Are orders appropriate based on the patient's diagnosis, regimen, and cycle? Yes . Does the plan date match the patient's scheduled date? Yes . Is the sequencing of drugs appropriate? Yes . Are the premedications appropriate for the patient's regimen? Yes . Prior Authorization for treatment is: Pending o If applicable, is the correct biosimilar selected based on the patient's insurance? yes  Organ Function and Labs: Marland Kitchen Are dose adjustments needed based on the patient's renal function, hepatic function, or hematologic function? Yes . Are appropriate labs ordered prior to the start of patient's treatment? Yes . Other organ system assessment, if indicated: N/A . The following baseline labs, if indicated, have been ordered: N/A  Dose Assessment: . Are the drug doses appropriate? Yes . Are the following correct: o Drug concentrations Yes o IV fluid compatible with drug Yes o Administration routes Yes o Timing of therapy Yes . If applicable, does the patient have documented access for treatment and/or plans for port-a-cath placement? not applicable . If applicable, have lifetime cumulative doses been properly documented and assessed? not applicable Lifetime Dose Tracking  No doses have been documented on this patient for the following tracked chemicals: Doxorubicin, Epirubicin, Idarubicin, Daunorubicin, Mitoxantrone, Bleomycin, Oxaliplatin, Carboplatin, Liposomal Doxorubicin  o   Toxicity Monitoring/Prevention: . The patient has the following take home antiemetics prescribed: Prochlorperazine . The patient has the following take home medications prescribed: N/A . Medication allergies and previous infusion related reactions, if applicable, have been reviewed and addressed. Yes . The patient's current medication list has been assessed for drug-drug interactions with their chemotherapy  regimen. no significant drug-drug interactions were identified on review.  Order Review: . Are the treatment plan orders signed? Yes . Is the patient scheduled to see a provider prior to their treatment? Yes  I verify that I have reviewed each item in the above checklist and answered each question accordingly.  Cassandra Cox Course 11/17/2020 2:45 PM

## 2020-11-18 ENCOUNTER — Encounter: Payer: Self-pay | Admitting: Surgical

## 2020-11-18 ENCOUNTER — Other Ambulatory Visit: Payer: Self-pay

## 2020-11-18 ENCOUNTER — Ambulatory Visit (INDEPENDENT_AMBULATORY_CARE_PROVIDER_SITE_OTHER): Payer: 59 | Admitting: Surgical

## 2020-11-18 ENCOUNTER — Inpatient Hospital Stay (HOSPITAL_BASED_OUTPATIENT_CLINIC_OR_DEPARTMENT_OTHER): Payer: 59 | Admitting: Oncology

## 2020-11-18 VITALS — BP 138/72 | HR 89 | Temp 97.7°F | Resp 17 | Ht 63.0 in | Wt 183.6 lb

## 2020-11-18 VITALS — BP 136/81 | HR 86

## 2020-11-18 DIAGNOSIS — Z803 Family history of malignant neoplasm of breast: Secondary | ICD-10-CM | POA: Diagnosis not present

## 2020-11-18 DIAGNOSIS — Z170 Estrogen receptor positive status [ER+]: Secondary | ICD-10-CM | POA: Diagnosis not present

## 2020-11-18 DIAGNOSIS — E559 Vitamin D deficiency, unspecified: Secondary | ICD-10-CM

## 2020-11-18 DIAGNOSIS — I10 Essential (primary) hypertension: Secondary | ICD-10-CM

## 2020-11-18 DIAGNOSIS — C50411 Malignant neoplasm of upper-outer quadrant of right female breast: Secondary | ICD-10-CM

## 2020-11-18 DIAGNOSIS — Z90710 Acquired absence of both cervix and uterus: Secondary | ICD-10-CM | POA: Diagnosis not present

## 2020-11-18 DIAGNOSIS — C773 Secondary and unspecified malignant neoplasm of axilla and upper limb lymph nodes: Secondary | ICD-10-CM | POA: Diagnosis not present

## 2020-11-18 NOTE — Progress Notes (Signed)
Patient is a 60 year old female here for follow-up after undergoing bilateral oncoplastic breast reduction with Dr. Marla Roe on 10/04/2020.  She is 7 weeks postop. Patient reports she is starting chemotherapy in a few weeks.  She is unsure if she will require radiation.  She reports that recently she noticed some drainage from her right breast along the vertical incision.  She reports that the drainage was yellowish clear with a little bit of orange. She reports otherwise she is doing well.  Chaperone present on exam B/l NAC viable, good color noted. No necrosis. Incisions c/d/I.  She does have a little bit of irritation medially on both breasts.  There is no rash evident or cellulitic changes.  It is not tender to palpation.  There is no excessive warmth noted.  She also has some fat necrosis noted along the superior pole of bilateral breast.  She reports it is not tender to palpation.  We discussed the use of scar creams to bilateral incisions.  We also discussed the use of massaging for the fat necrosis of bilateral breast.  She has noticed some improvement in the left breast fat necrosis over the past few weeks.  There is no sign of infection, seroma, hematoma. We discussed follow-up recommendations, I discussed with the patient that everything appears to be healing well and there is no sign of concern on exam.  We can follow-up as needed, if she has any questions or concerns or would like to be seen I would be happy to see her again.  Pictures were obtained of the patient and placed in the chart with the patient's or guardian's permission.

## 2020-11-22 MED ORDER — LIDOCAINE-PRILOCAINE 2.5-2.5 % EX CREA: TOPICAL_CREAM | CUTANEOUS | 3 refills | Status: DC

## 2020-11-23 ENCOUNTER — Ambulatory Visit: Payer: Self-pay | Admitting: General Surgery

## 2020-11-24 ENCOUNTER — Ambulatory Visit: Payer: 59 | Admitting: Nurse Practitioner

## 2020-11-24 ENCOUNTER — Other Ambulatory Visit: Payer: 59

## 2020-11-24 ENCOUNTER — Ambulatory Visit: Payer: 59

## 2020-11-24 ENCOUNTER — Inpatient Hospital Stay: Payer: 59

## 2020-11-24 ENCOUNTER — Ambulatory Visit: Payer: 59 | Admitting: Medical

## 2020-11-25 ENCOUNTER — Ambulatory Visit: Payer: 59 | Attending: General Surgery | Admitting: Physical Therapy

## 2020-11-25 ENCOUNTER — Encounter: Payer: Self-pay | Admitting: Physical Therapy

## 2020-11-25 ENCOUNTER — Encounter: Payer: Self-pay | Admitting: *Deleted

## 2020-11-25 ENCOUNTER — Other Ambulatory Visit: Payer: Self-pay

## 2020-11-25 DIAGNOSIS — Z170 Estrogen receptor positive status [ER+]: Secondary | ICD-10-CM

## 2020-11-25 DIAGNOSIS — M25611 Stiffness of right shoulder, not elsewhere classified: Secondary | ICD-10-CM | POA: Diagnosis present

## 2020-11-25 DIAGNOSIS — R293 Abnormal posture: Secondary | ICD-10-CM | POA: Insufficient documentation

## 2020-11-25 DIAGNOSIS — Z483 Aftercare following surgery for neoplasm: Secondary | ICD-10-CM

## 2020-11-25 DIAGNOSIS — C50411 Malignant neoplasm of upper-outer quadrant of right female breast: Secondary | ICD-10-CM | POA: Diagnosis not present

## 2020-11-25 DIAGNOSIS — M25612 Stiffness of left shoulder, not elsewhere classified: Secondary | ICD-10-CM | POA: Insufficient documentation

## 2020-11-25 NOTE — Therapy (Signed)
[place], [place], [address] Phone: [phone]   Fax:  [phone]  Physical Therapy Treatment  Patient Details  Name: Cassandra Cox MRN: [id number] Date of Birth: 19-Dec-1960 Referring Provider (PT): Dr. Autumn Messing   Encounter Date: 11/25/2020   PT End of Session - 11/25/20 1132    Visit Number 3    Number of Visits 3    PT Start Time 1102    PT Stop Time 1132    PT Time Calculation (min) 30 min    Activity Tolerance Patient tolerated treatment well    Behavior During Therapy [facility] for tasks assessed/performed           Past Medical History:  Diagnosis Date  . Complication of anesthesia   . Coronary artery disease    a. 11/2016 Cath/PCI: LM nl, LAD 30ost, 4m(3.0x18 Resolute Integrity DES--3.75), LCX nl, RCA nl; b. MV 11/18: negative for ischemia, EF 72%, low risk  . Depression   . Diabetes mellitus without complication ([facility]   . Diastolic dysfunction    a. 11/2016 Echo: EF 55-60%, Gr2 DD, LAE, nl RV size/fxn.  . Family history of breast cancer   . Hyperlipemia \\[id number]\ . Kidney stone 08/2016  . MVP (mitral valve prolapse)   . PONV (postoperative nausea and vomiting)   . STEMI (ST elevation myocardial infarction) ([place] 12/02/2016  . Vitamin B 12 deficiency     Past Surgical History:  Procedure Laterality Date  . ABDOMINAL HYSTERECTOMY  2003   TAH due to adenomyosis  . BREAST LUMPECTOMY WITH RADIOACTIVE SEED AND SENTINEL LYMPH NODE BIOPSY Right 10/04/2020   Procedure: RIGHT BREAST REDUCTION LUMPECTOMY WITH RADIOACTIVE SEED AND SENTINEL LYMPH NODE BIOPSY;  Surgeon: TJovita Kussmaul MD;  [place] [facility]  Service: General;  Laterality: Right;  . BREAST REDUCTION SURGERY  1990  . BREAST REDUCTION SURGERY Bilateral 10/04/2020   Procedure: BILATERAL MAMMARY REDUCTION  (BREAST);  Surgeon: DWallace Going DO;  [place]  Service: Plastics;   Laterality: Bilateral;  . CARDIAC CATHETERIZATION N/A 12/02/2016   Procedure: Coronary Stent Intervention;  Surgeon: CNelva Bush MD;  [facility];  Service: Cardiovascular;  Laterality: N/A;  . CARDIAC CATHETERIZATION N/A 12/02/2016   Procedure: Left Heart Cath and Coronary Angiography;  Surgeon: CNelva Bush MD;  [place] [facility];  Service: Cardiovascular;  Laterality: N/A;  . CARDIAC CATHETERIZATION N/A 12/02/2016   Procedure: Intravascular Ultrasound/IVUS;  Surgeon: CNelva Bush MD;  [facility];  Service: Cardiovascular;  Laterality: N/A;  . CORONARY STENT INTERVENTION    . MOUTH SURGERY     Dental implant    There were no vitals filed for this visit.   Subjective Assessment - 11/25/20 1107    Subjective I think my shoulder ROM is better. I decided to do chemo and I start 12/14/2020.    Pertinent History Patient was diagnosed on 06/30/2020 with right grade III invasive ductal carcinoma breast cancer. Patient underwent a right lumpectomy and sentinel node biopsy (1/3 nodes positive) on 10/04/2020 with a bilateral reduction. It is ER/PR positive and HER2 negative with a Ki67 of 30%.    Patient Stated Goals See if I'm back to baseline with my arm    Currently in Pain? No/denies              [facility] - 11/25/20 0001      Assessment   Medical  Diagnosis s/p right lumpectomy and sentinel node biopsy with bilateral reduction    Referring Provider (PT) Dr. Autumn Messing    Onset Date/Surgical Date 10/04/20    Hand Dominance Right      Precautions   Precautions Other (comment)    Precaution Comments recent surgery; right arm lymphedema risk      Restrictions   Weight Bearing Restrictions No      Balance Screen   Has the patient fallen in the past 6 months No    Has the patient had a decrease in activity level because of a fear of falling?  No    Is the patient reluctant to leave their home because of a fear of  falling?  No      Home Environment   Living Environment Private residence    Living Arrangements Alone    Available Help at Discharge Family      Prior Function   Level of [place] Retired    Leisure She is doing her HEP but is not walking      Cognition   Overall Cognitive Status Within Functional Limits for tasks assessed      Observation/Other Assessments   Observations Incisions both appear to be well healed. There are 2 small scabs on right breast at reduction incision that are now scabbed over and healing.      Posture/Postural Control   Posture/Postural Control Postural limitations    Postural Limitations Rounded Shoulders;Forward head      ROM / Strength   AROM / PROM / Strength AROM      AROM   AROM Assessment Site Shoulder    Right/Left Shoulder Right    Right Shoulder Extension 53 Degrees    Right Shoulder Flexion 153 Degrees    Right Shoulder ABduction 155 Degrees    Right Shoulder Internal Rotation 67 Degrees    Right Shoulder External Rotation 88 Degrees    Left Shoulder Extension 50 Degrees    Left Shoulder Flexion 148 Degrees    Left Shoulder ABduction 156 Degrees    Left Shoulder Internal Rotation 80 Degrees    Left Shoulder External Rotation 80 Degrees      Strength   Overall Strength Within functional limits for tasks performed                 Quick Dash - 11/25/20 0001    Open a tight or new jar Mild difficulty    Do heavy household chores (wash walls, wash floors) Moderate difficulty    Carry a shopping bag or briefcase No difficulty    Wash your back Mild difficulty    Use a knife to cut food No difficulty    Recreational activities in which you take some force or impact through your arm, shoulder, or hand (golf, hammering, tennis) No difficulty    During the past week, to what extent has your arm, shoulder or hand problem interfered with your normal social activities with family, friends, neighbors, or  groups? Not at all    During the past week, to what extent has your arm, shoulder or hand problem limited your work or other regular daily activities Not at all    Arm, shoulder, or hand pain. None    Tingling (pins and needles) in your arm, shoulder, or hand None    Difficulty Sleeping No difficulty    DASH Score 9.09 %  PT Education - 11/25/20 1132    Education Details Importance of a walking program during chemotherapy    Person(s) Educated Patient    Methods Explanation    Comprehension Verbalized understanding               PT Long Term Goals - 11/25/20 1135      PT LONG TERM GOAL #1   Title Patient will demonstrate she has regained full shoulder ROM and function post operatively compared to baselines.    Time 8    Period Weeks    Status Achieved                 Plan - 11/25/20 1133    Clinical Impression Statement Pt was seen today for a reassessment for shoulder ROM / function and scar tissue / incision healing. Her ROM and function is back to baseline and her incisions appear to be healing well. She has met rehab goals and has no further needs at this time. She knows ot contact us with ay questions or concerns.    PT Treatment/Interventions ADLs/Self Care Home Management;Therapeutic exercise;Patient/family education    PT Next Visit Plan D/C    PT Home Exercise Plan Post op shoulder ROM HEP    Consulted and Agree with Plan of Care Patient           Patient will benefit from skilled therapeutic intervention in order to improve the following deficits and impairments:  Postural dysfunction,Decreased range of motion,Decreased knowledge of precautions,Impaired UE functional use  Visit Diagnosis: Malignant neoplasm of upper-outer quadrant of right breast in female, estrogen receptor positive ([place])  Abnormal posture  Aftercare following surgery for neoplasm  Stiffness of left shoulder, not elsewhere  classified  Stiffness of right shoulder, not elsewhere classified     Problem List Patient Active Problem List   Diagnosis Date Noted  . Family history of breast cancer   . Malignant neoplasm of upper-outer quadrant of right breast in female, estrogen receptor positive ([place]) 08/13/2020  . Shortness of breath 02/19/2020  . Essential hypertension 09/09/2018  . Influenza B 10/18/2017  . Unstable angina ([place]) 09/14/2017  . Coronary artery disease involving native coronary artery of native heart without angina pectoris 07/18/2017  . Insomnia 08/25/2015  . Hot flashes, menopausal 08/25/2015  . Well woman exam 07/22/2015  . Family history of diabetes mellitus 07/15/2014  . Vitamin D deficiency 11/03/2009  . Hyperlipidemia LDL goal <70 11/03/2009  . ANXIETY 11/03/2009  . Depression 11/03/2009    PHYSICAL THERAPY DISCHARGE SUMMARY  Visits from Start of Care: 3  Current functional level related to goals / functional outcomes: Goals met. See above for objective findings.   Remaining deficits: None   Education / Equipment: Lymphedema education and HEP  Plan: Patient agrees to discharge.  Patient goals were met. Patient is being discharged due to meeting the stated rehab goals.  ?????        Annia Friendly, [place] 11/25/20 11:36 AM  [place], [place], [address] Phone: [phone]   Fax:  [phone]  Name: GENESIA CASLIN MRN: [id number] Date of Birth: 1961-04-15

## 2020-11-26 ENCOUNTER — Ambulatory Visit: Payer: 59

## 2020-12-02 ENCOUNTER — Other Ambulatory Visit: Payer: 59

## 2020-12-02 ENCOUNTER — Ambulatory Visit: Payer: 59 | Admitting: Hematology

## 2020-12-02 ENCOUNTER — Inpatient Hospital Stay: Payer: 59

## 2020-12-07 ENCOUNTER — Encounter: Payer: Self-pay | Admitting: Pulmonary Disease

## 2020-12-07 ENCOUNTER — Other Ambulatory Visit: Payer: Self-pay

## 2020-12-07 ENCOUNTER — Ambulatory Visit: Payer: 59 | Admitting: Pulmonary Disease

## 2020-12-07 DIAGNOSIS — F5105 Insomnia due to other mental disorder: Secondary | ICD-10-CM

## 2020-12-07 DIAGNOSIS — F99 Mental disorder, not otherwise specified: Secondary | ICD-10-CM | POA: Diagnosis not present

## 2020-12-07 NOTE — Progress Notes (Signed)
Subjective:    Patient ID: Cassandra Cox, female    DOB: 09/15/1961, 60 y.o.   MRN: [id number]  HPI    Chief Complaint  Patient presents with  . Consult    Referred by her cardiologist since she takes [place].  Pts daughter said that the pt snores   4 year old referred for evaluation of sleep related problems. She has just been diagnosed with breast cancer with lymph node involvement and is scheduled to start chemotherapy next week.  She had an MI in 2018 and required stent to LAD.  She has been referred by her cardiologist. She reports longstanding insomnia starting in her 77s, around the time of her messy divorce and has been maintained on Ambien CR 12.5 mg since then.  She has always been a night owl.  She reports nonrefreshing sleep, Epworth sleepiness score is 5. Bedtime can be as late as midnight, sleep latency is about 30 minutes after taking Ambien, she sleeps on her side with 1 pillow denies nocturnal awakenings.  When she falls asleep she is able to sleep hard.  No TV or screens at bedtime.  She will sleep as late as 10 AM and wakes up feeling rested with dryness of mouth but denies headaches.  She denies naps during the daytime.  Daughter has noted snoring but no choking or gasping episodes in her sleep.  No apneas have been witnessed There is no history suggestive of cataplexy, sleep paralysis or parasomnias       Past Medical History:  Diagnosis Date  . Complication of anesthesia   . Coronary artery disease    a. 11/2016 Cath/PCI: LM nl, LAD 30ost, 2m(3.0x18 Resolute Integrity DES--3.75), LCX nl, RCA nl; b. MV 11/18: negative for ischemia, EF 72%, low risk  . Depression   . Diabetes mellitus without complication ([facility]   . Diastolic dysfunction    a. 11/2016 Echo: EF 55-60%, Gr2 DD, LAE, nl RV size/fxn.  . Family history of breast cancer   . Hyperlipemia \\[id number]\ . Kidney stone 08/2016  . MVP (mitral valve prolapse)   . PONV (postoperative nausea and vomiting)    . STEMI (ST elevation myocardial infarction) ([facility] 12/02/2016  . Vitamin B 12 deficiency     Past Surgical History:  Procedure Laterality Date  . ABDOMINAL HYSTERECTOMY  2003   TAH due to adenomyosis  . BREAST LUMPECTOMY WITH RADIOACTIVE SEED AND SENTINEL LYMPH NODE BIOPSY Right 10/04/2020   Procedure: RIGHT BREAST REDUCTION LUMPECTOMY WITH RADIOACTIVE SEED AND SENTINEL LYMPH NODE BIOPSY;  Surgeon: TJovita Kussmaul MD;  [place]  Service: General;  Laterality: Right;  . BREAST REDUCTION SURGERY  1990  . BREAST REDUCTION SURGERY Bilateral 10/04/2020   Procedure: BILATERAL MAMMARY REDUCTION  (BREAST);  Surgeon: DWallace Going DO;  [place] [facility]  Service: Plastics;  Laterality: Bilateral;  . CARDIAC CATHETERIZATION N/A 12/02/2016   Procedure: Coronary Stent Intervention;  Surgeon: CNelva Bush MD;  [place] [facility];  Service: Cardiovascular;  Laterality: N/A;  . CARDIAC CATHETERIZATION N/A 12/02/2016   Procedure: Left Heart Cath and Coronary Angiography;  Surgeon: CNelva Bush MD;  [facility];  Service: Cardiovascular;  Laterality: N/A;  . CARDIAC CATHETERIZATION N/A 12/02/2016   Procedure: Intravascular Ultrasound/IVUS;  Surgeon: CNelva Bush MD;  [facility];  Service: Cardiovascular;  Laterality: N/A;  . CORONARY STENT INTERVENTION    . MOUTH SURGERY     Dental implant  Allergies  Allergen Reactions  . Prednisone Other (See Comments)    REACTION: Swelling REACTION: Swelling Facial swelling REACTION: Swelling  . Sulfa Antibiotics Hives and Other (See Comments)    REACTION: Hives  . Sulfasalazine     REACTION: Hives  . Sulfonamide Derivatives     REACTION: Hives    Social History   Socioeconomic History  . Marital status: Divorced    Spouse name: Not on file  . Number of children: Not on file  . Years of education: Not on file  . Highest education  level: Not on file  Occupational History    Employer: [employer]  Tobacco Use  . Smoking status: Never Smoker  . Smokeless tobacco: Never Used  Vaping Use  . Vaping Use: Never used  Substance and Sexual Activity  . Alcohol use: No  . Drug use: No  . Sexual activity: Yes    Birth control/protection: Surgical  Other Topics Concern  . Not on file  Social History Narrative  . Not on file   Social Determinants of Health   Financial Resource Strain: Not on file  Food Insecurity: Not on file  Transportation Needs: Not on file  Physical Activity: Not on file  Stress: Not on file  Social Connections: Not on file  Intimate Partner Violence: Not on file     Family History  Problem Relation Age of Onset  . Heart disease Mother   . Diabetes Mother   . Stroke Mother   . Heart attack Mother   . Atrial fibrillation Mother   . Hypertension Mother   . Hyperlipidemia Mother   . COPD Father   . Hypertension Father   . Hyperlipidemia Father   . Breast cancer Maternal Aunt 28       x2 age 75  . Diabetes Maternal Grandfather       Review of Systems neg for any significant sore throat, dysphagia, itching, sneezing, nasal congestion or excess/ purulent secretions, fever, chills, sweats, unintended wt loss, pleuritic or exertional cp, hempoptysis, orthopnea pnd or change in chronic leg swelling. Also denies presyncope, palpitations, heartburn, abdominal pain, nausea, vomiting, diarrhea or change in bowel or urinary habits, dysuria,hematuria, rash, arthralgias, visual complaints, headache, numbness weakness or ataxia.     Objective:   Physical Exam  Gen. Pleasant, well-nourished, in no distress, normal affect ENT - no pallor,icterus, no post nasal drip Neck: No JVD, no thyromegaly, no carotid bruits Lungs: no use of accessory muscles, no dullness to percussion, clear without rales or rhonchi  Cardiovascular: Rhythm regular, heart sounds  normal, no murmurs or gallops,  no peripheral edema Abdomen: soft and non-tender, no hepatosplenomegaly, BS normal. Musculoskeletal: No deformities, no cyanosis or clubbing Neuro:  alert, non focal       Assessment & Plan:

## 2020-12-07 NOTE — Progress Notes (Signed)
Pharmacist Chemotherapy Monitoring - Initial Assessment    Anticipated start date: 12/14/20  Regimen:  . Are orders appropriate based on the patient's diagnosis, regimen, and cycle? Yes . Does the plan date match the patient's scheduled date? Yes . Is the sequencing of drugs appropriate? Yes . Are the premedications appropriate for the patient's regimen? Yes . Prior Authorization for treatment is: Approved o If applicable, is the correct biosimilar selected based on the patient's insurance? yes  Organ Function and Labs: Marland Kitchen Are dose adjustments needed based on the patient's renal function, hepatic function, or hematologic function? Yes . Are appropriate labs ordered prior to the start of patient's treatment? Yes . Other organ system assessment, if indicated: N/A . The following baseline labs, if indicated, have been ordered: N/A  Dose Assessment: . Are the drug doses appropriate? Yes . Are the following correct: o Drug concentrations Yes o IV fluid compatible with drug Yes o Administration routes Yes o Timing of therapy Yes . If applicable, does the patient have documented access for treatment and/or plans for port-a-cath placement? yes . If applicable, have lifetime cumulative doses been properly documented and assessed? not applicable Lifetime Dose Tracking  No doses have been documented on this patient for the following tracked chemicals: Doxorubicin, Epirubicin, Idarubicin, Daunorubicin, Mitoxantrone, Bleomycin, Oxaliplatin, Carboplatin, Liposomal Doxorubicin  o   Toxicity Monitoring/Prevention: . The patient has the following take home antiemetics prescribed: Prochlorperazine . The patient has the following take home medications prescribed: N/A . Medication allergies and previous infusion related reactions, if applicable, have been reviewed and addressed. Yes . The patient's current medication list has been assessed for drug-drug interactions with their chemotherapy regimen. no  significant drug-drug interactions were identified on review.  Order Review: . Are the treatment plan orders signed? Yes . Is the patient scheduled to see a provider prior to their treatment? Yes  I verify that I have reviewed each item in the above checklist and answered each question accordingly.  @RPHNAME @

## 2020-12-07 NOTE — Patient Instructions (Signed)
  Good luck with your treatment ! Please contact us after you are done with chemotherapy and we can schedule home sleep test

## 2020-12-07 NOTE — Assessment & Plan Note (Signed)
She has longstanding insomnia for more than 20 years and has been maintained on Ambien.  Alprazolam is also listed on her medication list although she is not taking this.  She has anxiety and depression that dates back at least 2 decades.  Ambien has been's prescribed by her psychiatrist.  She is about to start chemotherapy next week for breast cancer.  This would not be a good time to make changes to her insomnia regimen.  Regardless this seems to be longstanding insomnia and well cognitive behavioral therapy would be of benefit, adjustments could also be made to her psychiatric regimen and choose a antidepressant that has some hypnotic effects.  Regardless this is not the time to make these changes.  Would defer to her psychiatrist in the future. I went over simple rules of sleep hygiene.  She does not have excessive caffeinated beverage use.  Regarding OSA she does not have very many red flags such as witnessed apneas, choking or gasping episodes or excessive daytime somnolence.  I suggested a home sleep test and we would defer this until she finishes her cancer treatment.  She will contact us after that to schedule

## 2020-12-09 ENCOUNTER — Other Ambulatory Visit (HOSPITAL_COMMUNITY)
Admission: RE | Admit: 2020-12-09 | Discharge: 2020-12-09 | Disposition: A | Discharge status: Home / Self Care | Payer: 59 | Source: Ambulatory Visit | Attending: General Surgery | Admitting: General Surgery

## 2020-12-09 ENCOUNTER — Encounter (HOSPITAL_BASED_OUTPATIENT_CLINIC_OR_DEPARTMENT_OTHER)
Admission: RE | Admit: 2020-12-09 | Discharge: 2020-12-09 | Disposition: A | Discharge status: Home / Self Care | Payer: 59 | Source: Ambulatory Visit | Attending: General Surgery | Admitting: General Surgery

## 2020-12-09 ENCOUNTER — Encounter (HOSPITAL_COMMUNITY): Payer: Self-pay | Admitting: General Surgery

## 2020-12-09 DIAGNOSIS — Z20822 Contact with and (suspected) exposure to covid-19: Secondary | ICD-10-CM | POA: Diagnosis not present

## 2020-12-09 DIAGNOSIS — E785 Hyperlipidemia, unspecified: Secondary | ICD-10-CM | POA: Insufficient documentation

## 2020-12-09 DIAGNOSIS — I252 Old myocardial infarction: Secondary | ICD-10-CM | POA: Insufficient documentation

## 2020-12-09 DIAGNOSIS — Z7902 Long term (current) use of antithrombotics/antiplatelets: Secondary | ICD-10-CM | POA: Insufficient documentation

## 2020-12-09 DIAGNOSIS — I2510 Atherosclerotic heart disease of native coronary artery without angina pectoris: Secondary | ICD-10-CM | POA: Insufficient documentation

## 2020-12-09 DIAGNOSIS — Z01812 Encounter for preprocedural laboratory examination: Secondary | ICD-10-CM | POA: Diagnosis present

## 2020-12-09 DIAGNOSIS — Z955 Presence of coronary angioplasty implant and graft: Secondary | ICD-10-CM | POA: Insufficient documentation

## 2020-12-09 DIAGNOSIS — G4733 Obstructive sleep apnea (adult) (pediatric): Secondary | ICD-10-CM | POA: Insufficient documentation

## 2020-12-09 LAB — BASIC METABOLIC PANEL
Anion gap: 12 (ref 5–15)
BUN: 13 mg/dL (ref 8–23)
CO2: 23 mmol/L (ref 22–32)
Calcium: 9.5 mg/dL (ref 8.9–10.3)
Chloride: 102 mmol/L (ref 98–111)
Creatinine, Ser: 0.66 mg/dL (ref 0.44–1.00)
GFR, Estimated: >60 mL/min (ref >60)
Glucose, Bld: 108 mg/dL — ABNORMAL HIGH (ref 70–99)
Potassium: 4.2 mmol/L (ref 3.5–5.1)
Sodium: 137 mmol/L (ref 135–145)

## 2020-12-09 LAB — SARS CORONAVIRUS 2 (TAT 6-24 HRS): SARS Coronavirus 2: NEGATIVE

## 2020-12-09 NOTE — Progress Notes (Signed)
PCP - Lanell Persons, PA-C (in [place]) Cardiologist - Christopher End  PPM/ICD - denies   Chest x-ray - n/a EKG - 05/21/20 Stress Test - 09/14/2017 ECHO - 06/24/20 Cardiac Cath - 12/02/16  CPAP - denies  Fasting Blood Sugar - 100 Checks Blood Sugars does not check. Will check the am of surgery.   Blood Thinner Instructions: Hold Plavix 5 days prior to surgery. Stop taking today. Cardiology stated to continue aspirin.  Patient instructed to hold all NSAID's, herbal medications, fish oil and vitamins 7 days prior to surgery.   ERAS Protcol - yes  COVID TEST- 12/09/20  Anesthesia review: yes, cardiac history. Pt being followed by Harrell Gave End. Cardiac Clearance found in encounters back in 08/2020.  Patient verbally denies any shortness of breath, fever, cough and chest pain during phone call   -------------  SDW INSTRUCTIONS given:  Your procedure is scheduled on 12/13/20.  Report to [facility] Main Entrance "A" at 10:30 A.M., and check in at the Admitting office.  Call this number if you have problems the morning of surgery:  [phone]   Remember:  Do not eat after midnight the night before your surgery  You may drink clear liquids until 10:00am the morning of your surgery.   Clear liquids allowed are: Water, Non-Citrus Juices (without pulp), Carbonated Beverages, Clear Tea, Black Coffee Only, and Gatorade    Take these medicines the morning of surgery with A SIP OF WATER  ALPRAZolam (XANAX)  ARMOUR THYROID atorvastatin (LIPITOR) buPROPion (WELLBUTRIN XL) ezetimibe (ZETIA) liothyronine (CYTOMEL) FLUoxetine (PROZAC)  montelukast (SINGULAIR)  As of today, STOP taking any  Aleve, Naproxen, Ibuprofen, Motrin, Advil, Goody's, BC's, all herbal medications, fish oil, and all vitamins. Please stop taking your plavix as of today and continue taking your aspirin per your cardiologist.                       Do not wear jewelry, make up, or nail polish            Do not  wear lotions, powders, perfumes/colognes, or deodorant.            Do not shave 48 hours prior to surgery.  Men may shave face and neck.            Do not bring valuables to the hospital.            [facility] is not responsible for any belongings or valuables.  Do NOT Smoke (Tobacco/Vaping) or drink Alcohol 24 hours prior to your procedure If you use a CPAP at night, you may bring all equipment for your overnight stay.   Contacts, glasses, dentures or bridgework may not be worn into surgery.      For patients admitted to the hospital, discharge time will be determined by your treatment team.   Patients discharged the day of surgery will not be allowed to drive home, and someone needs to stay with them for 24 hours.    Special instructions:   [place]- Preparing For Surgery  Before surgery, you can play an important role. Because skin is not sterile, your skin needs to be as free of germs as possible. You can reduce the number of germs on your skin by washing with CHG (chlorahexidine gluconate) Soap before surgery.  CHG is an antiseptic cleaner which kills germs and bonds with the skin to continue killing germs even after washing.    Oral Hygiene is also important to reduce your  risk of infection.  Remember - BRUSH YOUR TEETH THE MORNING OF SURGERY WITH YOUR REGULAR TOOTHPASTE  Please do not use if you have an allergy to CHG or antibacterial soaps. If your skin becomes reddened/irritated stop using the CHG.  Do not shave (including legs and underarms) for at least 48 hours prior to first CHG shower. It is OK to shave your face.  Please follow these instructions carefully.   1. Shower the NIGHT BEFORE SURGERY and the MORNING OF SURGERY with DIAL Soap.   2. Pat yourself dry with a CLEAN TOWEL.  3. Wear CLEAN PAJAMAS to bed the night before surgery  4. Place CLEAN SHEETS on your bed the night of your first shower and DO NOT SLEEP WITH PETS.   Day of Surgery: Please shower  morning of surgery  Wear Clean/Comfortable clothing the morning of surgery Do not apply any deodorants/lotions.   Remember to brush your teeth WITH YOUR REGULAR TOOTHPASTE.   Questions were answered. Patient verbalized understanding of instructions.

## 2020-12-09 NOTE — Progress Notes (Signed)

## 2020-12-10 NOTE — Anesthesia Preprocedure Evaluation (Addendum)
Anesthesia Evaluation  Patient identified by MRN, date of birth, ID band Patient awake    Reviewed: Allergy & Precautions, NPO status , Patient's Chart, lab work & pertinent test results  History of Anesthesia Complications Negative for: history of anesthetic complications  Airway Mallampati: II  TM Distance: >3 FB Neck ROM: Full    Dental   Pulmonary neg pulmonary ROS,    Pulmonary exam normal        Cardiovascular + CAD, + Past MI and + Cardiac Stents (2018)  Normal cardiovascular exam     Neuro/Psych Anxiety Depression negative neurological ROS     GI/Hepatic negative GI ROS, Neg liver ROS,   Endo/Other  diabetesHypothyroidism   Renal/GU negative Renal ROS  negative genitourinary   Musculoskeletal negative musculoskeletal ROS (+)   Abdominal   Peds  Hematology Plavix   Anesthesia Other Findings  Breast cancer s/p lumpectomy  Echo 06/23/20: EF 60-65%, g2dd, normal RV function, valves unremarkable  Reproductive/Obstetrics                           Anesthesia Physical Anesthesia Plan  ASA: III  Anesthesia Plan: General   Post-op Pain Management:    Induction: Intravenous  PONV Risk Score and Plan: 3 and Ondansetron, Dexamethasone, Midazolam and Treatment may vary due to age or medical condition  Airway Management Planned: LMA  Additional Equipment: None  Intra-op Plan:   Post-operative Plan: Extubation in OR  Informed Consent: I have reviewed the patients History and Physical, chart, labs and discussed the procedure including the risks, benefits and alternatives for the proposed anesthesia with the patient or authorized representative who has indicated his/her understanding and acceptance.     Dental advisory given  Plan Discussed with:   Anesthesia Plan Comments: (PAT note by Karoline Caldwell, PA-C: Follows with cardiology for history of CAD with anterior ST elevation MI in  11/2016 status post PCI/DES to the LAD, pSVT, HLD.  Last seen 07/22/2020, per note doing well from a cardiology perspective.  She was subsequently given clearance on 08/19/2020 to undergo right breast lumpectomy which she had on 42/59/5638 without complication.  Patient reports she was advised to stop Plavix 5 days prior to surgery.  Recently seen by pulmonologist Dr. Elsworth Soho for concern of possible OSA, however Dr. Elsworth Soho recommended deferring home sleep test until she was finished with her cancer treatment.  She will need day of surgery labs and evaluation.  EKG 05/21/2020: Sinus rhythm with short PR.  Rate 95.  Nonspecific ST-T wave abnormality.  No significant change since last tracing.  TTE 06/23/2020: 1. Left ventricular ejection fraction, by estimation, is 60 to 65%. The  left ventricle has normal function. The left ventricle has no regional  wall motion abnormalities. Left ventricular diastolic parameters are  consistent with Grade II diastolic  dysfunction (pseudonormalization).  2. Right ventricular systolic function is normal. The right ventricular  size is normal. Tricuspid regurgitation signal is inadequate for assessing  PA pressure.   Event monitor 06/18/2020: The patient was monitored for 8 days, 5 hours. The predominant rhythm was sinus with an average rate of 87 bpm (range 64-146 bpm in sinus). There were rare PAC's and PVC's. A single 5 beat atrial run occurred with a maximum rate of 164 bpm. No sustained arrhythmia or prolonged pause was observerd. There were no patient triggered events.   Predominantly sinus rhythm with rare PAC's and PVC's, as well as a single brief atrial run.  No significant arrhythmia observed.  Cath and PCI 12/02/2016: Conclusions: Anterior STEMI with thrombotic occlusion of the mid LAD, likely due to acute plaque rupture. Moderate diffuse atherosclerotic plaquing of the proximal and mid LAD by IVUS. Mildly elevated left ventricular filling  pressure. Successful IVUS-guided PCI to the mid LAD with placement of an Integrity Resolute 3.0 x 18 mm drug-eluting stent, post-dilated in the proximal and mid portions with 3.75 mm noncompliant balloon.  Recommendations: Admit to ICU on hospitalist service. TR band protocol. Continue tirofiban x 4 hours. Dual antiplatelet therapy with aspirin and ticagrelor for at least 12 months. Aggressive secondary prevention, including high-intensity statin therapy. Echocardiogram to evaluate LV function.  )      Anesthesia Quick Evaluation

## 2020-12-10 NOTE — Progress Notes (Addendum)
Anesthesia Chart Review: Same-day work-up  Follows with cardiology for history of CAD with anterior ST elevation MI in 11/2016 status post PCI/DES to the LAD, pSVT, HLD.  Last seen 07/22/2020, per note doing well from a cardiology perspective.  She was subsequently given clearance on 08/19/2020 to undergo right breast lumpectomy which she had on 23/55/7322 without complication.  Patient reports she was advised to stop Plavix 5 days prior to surgery.  Recently seen by pulmonologist Dr. Elsworth Soho for concern of possible OSA, however Dr. Elsworth Soho recommended deferring home sleep test until she was finished with her cancer treatment.  She will need day of surgery labs and evaluation.  EKG 05/21/2020: Sinus rhythm with short PR.  Rate 95.  Nonspecific ST-T wave abnormality.  No significant change since last tracing.  TTE 06/23/2020: 1. Left ventricular ejection fraction, by estimation, is 60 to 65%. The  left ventricle has normal function. The left ventricle has no regional  wall motion abnormalities. Left ventricular diastolic parameters are  consistent with Grade II diastolic  dysfunction (pseudonormalization).  2. Right ventricular systolic function is normal. The right ventricular  size is normal. Tricuspid regurgitation signal is inadequate for assessing  PA pressure.   Event monitor 06/18/2020:  The patient was monitored for 8 days, 5 hours.  The predominant rhythm was sinus with an average rate of 87 bpm (range 64-146 bpm in sinus).  There were rare PAC's and PVC's. A single 5 beat atrial run occurred with a maximum rate of 164 bpm.  No sustained arrhythmia or prolonged pause was observerd.  There were no patient triggered events.   Predominantly sinus rhythm with rare PAC's and PVC's, as well as a single brief atrial run.  No significant arrhythmia observed.  Cath and PCI 12/02/2016: Conclusions: 1. Anterior STEMI with thrombotic occlusion of the mid LAD, likely due to acute plaque  rupture. 2. Moderate diffuse atherosclerotic plaquing of the proximal and mid LAD by IVUS. 3. Mildly elevated left ventricular filling pressure. 4. Successful IVUS-guided PCI to the mid LAD with placement of an Integrity Resolute 3.0 x 18 mm drug-eluting stent, post-dilated in the proximal and mid portions with 3.75 mm noncompliant balloon.  Recommendations: 1. Admit to ICU on hospitalist service. 2. TR band protocol. 3. Continue tirofiban x 4 hours. 4. Dual antiplatelet therapy with aspirin and ticagrelor for at least 12 months. 5. Aggressive secondary prevention, including high-intensity statin therapy. 6. Echocardiogram to evaluate LV function.  Wynonia Musty [facility]/Anesthesiology Phone [phone] 12/10/2020 10:09 AM

## 2020-12-13 ENCOUNTER — Ambulatory Visit (HOSPITAL_BASED_OUTPATIENT_CLINIC_OR_DEPARTMENT_OTHER)
Admission: RE | Admit: 2020-12-13 | Discharge: 2020-12-13 | Disposition: A | Discharge status: Home / Self Care | Payer: 59 | Attending: General Surgery | Admitting: General Surgery

## 2020-12-13 ENCOUNTER — Other Ambulatory Visit: Payer: Self-pay

## 2020-12-13 ENCOUNTER — Ambulatory Visit (HOSPITAL_BASED_OUTPATIENT_CLINIC_OR_DEPARTMENT_OTHER): Payer: 59 | Admitting: Physician Assistant

## 2020-12-13 ENCOUNTER — Encounter (HOSPITAL_BASED_OUTPATIENT_CLINIC_OR_DEPARTMENT_OTHER): Payer: Self-pay | Admitting: General Surgery

## 2020-12-13 ENCOUNTER — Ambulatory Visit (HOSPITAL_COMMUNITY): Payer: 59

## 2020-12-13 ENCOUNTER — Encounter (HOSPITAL_BASED_OUTPATIENT_CLINIC_OR_DEPARTMENT_OTHER)
Admission: RE | Disposition: A | Discharge status: Home / Self Care | Payer: Self-pay | Source: Home / Self Care | Attending: General Surgery

## 2020-12-13 ENCOUNTER — Ambulatory Visit (HOSPITAL_BASED_OUTPATIENT_CLINIC_OR_DEPARTMENT_OTHER): Payer: 59 | Admitting: Certified Registered Nurse Anesthetist

## 2020-12-13 DIAGNOSIS — Z170 Estrogen receptor positive status [ER+]: Secondary | ICD-10-CM | POA: Insufficient documentation

## 2020-12-13 DIAGNOSIS — C50111 Malignant neoplasm of central portion of right female breast: Secondary | ICD-10-CM | POA: Insufficient documentation

## 2020-12-13 DIAGNOSIS — Z8719 Personal history of other diseases of the digestive system: Secondary | ICD-10-CM | POA: Diagnosis not present

## 2020-12-13 DIAGNOSIS — E7800 Pure hypercholesterolemia, unspecified: Secondary | ICD-10-CM | POA: Insufficient documentation

## 2020-12-13 DIAGNOSIS — I252 Old myocardial infarction: Secondary | ICD-10-CM | POA: Diagnosis not present

## 2020-12-13 DIAGNOSIS — Z8249 Family history of ischemic heart disease and other diseases of the circulatory system: Secondary | ICD-10-CM | POA: Insufficient documentation

## 2020-12-13 DIAGNOSIS — Z90711 Acquired absence of uterus with remaining cervical stump: Secondary | ICD-10-CM | POA: Insufficient documentation

## 2020-12-13 DIAGNOSIS — Z419 Encounter for procedure for purposes other than remedying health state, unspecified: Secondary | ICD-10-CM

## 2020-12-13 DIAGNOSIS — Z888 Allergy status to other drugs, medicaments and biological substances status: Secondary | ICD-10-CM | POA: Insufficient documentation

## 2020-12-13 DIAGNOSIS — Z95828 Presence of other vascular implants and grafts: Secondary | ICD-10-CM

## 2020-12-13 DIAGNOSIS — Z882 Allergy status to sulfonamides status: Secondary | ICD-10-CM | POA: Diagnosis not present

## 2020-12-13 DIAGNOSIS — Z803 Family history of malignant neoplasm of breast: Secondary | ICD-10-CM | POA: Diagnosis not present

## 2020-12-13 HISTORY — DX: Nonrheumatic mitral (valve) prolapse: I34.1

## 2020-12-13 HISTORY — PX: PORTACATH PLACEMENT: SHX2246

## 2020-12-13 HISTORY — DX: Hypothyroidism, unspecified: E03.9

## 2020-12-13 HISTORY — DX: Anxiety disorder, unspecified: F41.9

## 2020-12-13 HISTORY — DX: Personal history of urinary calculi: Z87.442

## 2020-12-13 HISTORY — DX: Myoneural disorder, unspecified: G70.9

## 2020-12-13 LAB — GLUCOSE, CAPILLARY
Glucose-Capillary: 108 mg/dL — ABNORMAL HIGH (ref 70–99)
Glucose-Capillary: 136 mg/dL — ABNORMAL HIGH (ref 70–99)

## 2020-12-13 SURGERY — INSERTION, TUNNELED CENTRAL VENOUS DEVICE, WITH PORT
Anesthesia: General | Site: Breast | Laterality: Left

## 2020-12-13 MED ORDER — FENTANYL CITRATE (PF) 100 MCG/2ML IJ SOLN
INTRAMUSCULAR | Status: DC | PRN
  Administered 2020-12-13 (×2): 50 ug via INTRAVENOUS

## 2020-12-13 MED ORDER — DIPHENHYDRAMINE HCL 50 MG/ML IJ SOLN
INTRAMUSCULAR | Status: DC | PRN
  Administered 2020-12-13: 12.5 mg via INTRAVENOUS

## 2020-12-13 MED ORDER — CHLORHEXIDINE GLUCONATE CLOTH 2 % EX PADS: 6.0000 | MEDICATED_PAD | Freq: Once | CUTANEOUS | Status: DC

## 2020-12-13 MED ORDER — GABAPENTIN 300 MG PO CAPS
ORAL_CAPSULE | ORAL | Status: AC
  Filled 2020-12-13: qty 1

## 2020-12-13 MED ORDER — MIDAZOLAM HCL 5 MG/5ML IJ SOLN
INTRAMUSCULAR | Status: DC | PRN
  Administered 2020-12-13: 2 mg via INTRAVENOUS

## 2020-12-13 MED ORDER — BUPIVACAINE-EPINEPHRINE 0.25% -1:200000 IJ SOLN
INTRAMUSCULAR | Status: DC | PRN
  Administered 2020-12-13: 8 mL

## 2020-12-13 MED ORDER — PROPOFOL 10 MG/ML IV BOLUS
INTRAVENOUS | Status: DC | PRN
  Administered 2020-12-13: 200 mg via INTRAVENOUS

## 2020-12-13 MED ORDER — CEFAZOLIN SODIUM-DEXTROSE 2-4 GM/100ML-% IV SOLN
INTRAVENOUS | Status: AC
  Filled 2020-12-13: qty 100

## 2020-12-13 MED ORDER — LIDOCAINE 2% (20 MG/ML) 5 ML SYRINGE
INTRAMUSCULAR | Status: AC
  Filled 2020-12-13: qty 5

## 2020-12-13 MED ORDER — ONDANSETRON HCL 4 MG/2ML IJ SOLN
INTRAMUSCULAR | Status: AC
  Filled 2020-12-13: qty 2

## 2020-12-13 MED ORDER — PHENYLEPHRINE 40 MCG/ML (10ML) SYRINGE FOR IV PUSH (FOR BLOOD PRESSURE SUPPORT)
PREFILLED_SYRINGE | INTRAVENOUS | Status: AC
  Filled 2020-12-13: qty 10

## 2020-12-13 MED ORDER — MIDAZOLAM HCL 2 MG/2ML IJ SOLN
INTRAMUSCULAR | Status: AC
  Filled 2020-12-13: qty 2

## 2020-12-13 MED ORDER — HEPARIN SOD (PORK) LOCK FLUSH 100 UNIT/ML IV SOLN
INTRAVENOUS | Status: DC | PRN
  Administered 2020-12-13: 500 [IU]

## 2020-12-13 MED ORDER — HYDROCODONE-ACETAMINOPHEN 5-325 MG PO TABS: 1.0000 | ORAL_TABLET | Freq: Four times a day (QID) | ORAL | 0 refills | Status: DC | PRN

## 2020-12-13 MED ORDER — ACETAMINOPHEN 500 MG PO TABS
ORAL_TABLET | ORAL | Status: AC
  Filled 2020-12-13: qty 2

## 2020-12-13 MED ORDER — HEPARIN (PORCINE) IN NACL 1000-0.9 UT/500ML-% IV SOLN
INTRAVENOUS | Status: AC
  Filled 2020-12-13: qty 500

## 2020-12-13 MED ORDER — HEPARIN SOD (PORK) LOCK FLUSH 100 UNIT/ML IV SOLN
INTRAVENOUS | Status: AC
  Filled 2020-12-13: qty 5

## 2020-12-13 MED ORDER — GABAPENTIN 300 MG PO CAPS
300.0000 mg | ORAL_CAPSULE | ORAL | Status: AC
  Administered 2020-12-13: 300 mg via ORAL

## 2020-12-13 MED ORDER — FENTANYL CITRATE (PF) 100 MCG/2ML IJ SOLN
INTRAMUSCULAR | Status: AC
  Filled 2020-12-13: qty 2

## 2020-12-13 MED ORDER — CEFAZOLIN SODIUM-DEXTROSE 2-4 GM/100ML-% IV SOLN
2.0000 g | INTRAVENOUS | Status: AC
  Administered 2020-12-13: 2 g via INTRAVENOUS

## 2020-12-13 MED ORDER — ONDANSETRON HCL 4 MG/2ML IJ SOLN
INTRAMUSCULAR | Status: DC | PRN
  Administered 2020-12-13: 4 mg via INTRAVENOUS

## 2020-12-13 MED ORDER — HEPARIN (PORCINE) IN NACL 2-0.9 UNITS/ML
INTRAMUSCULAR | Status: AC | PRN
  Administered 2020-12-13: 500 mL

## 2020-12-13 MED ORDER — DIPHENHYDRAMINE HCL 50 MG/ML IJ SOLN
INTRAMUSCULAR | Status: AC
  Filled 2020-12-13: qty 1

## 2020-12-13 MED ORDER — LIDOCAINE 2% (20 MG/ML) 5 ML SYRINGE
INTRAMUSCULAR | Status: DC | PRN
  Administered 2020-12-13: 80 mg via INTRAVENOUS

## 2020-12-13 MED ORDER — ACETAMINOPHEN 500 MG PO TABS
1000.0000 mg | ORAL_TABLET | ORAL | Status: AC
  Administered 2020-12-13: 1000 mg via ORAL

## 2020-12-13 MED ORDER — PHENYLEPHRINE 40 MCG/ML (10ML) SYRINGE FOR IV PUSH (FOR BLOOD PRESSURE SUPPORT)
PREFILLED_SYRINGE | INTRAVENOUS | Status: DC | PRN
  Administered 2020-12-13 (×2): 80 ug via INTRAVENOUS
  Administered 2020-12-13: 120 ug via INTRAVENOUS
  Administered 2020-12-13: 80 ug via INTRAVENOUS

## 2020-12-13 MED ORDER — LACTATED RINGERS IV SOLN: INTRAVENOUS | Status: DC | PRN

## 2020-12-13 MED ORDER — DEXAMETHASONE SODIUM PHOSPHATE 10 MG/ML IJ SOLN
INTRAMUSCULAR | Status: DC | PRN
  Administered 2020-12-13: 10 mg via INTRAVENOUS

## 2020-12-13 SURGICAL SUPPLY — 45 items
ADH SKN CLS APL DERMABOND .7 (GAUZE/BANDAGES/DRESSINGS) ×1
APL PRP STRL LF DISP 70% ISPRP (MISCELLANEOUS) ×1
BAG DECANTER FOR FLEXI CONT (MISCELLANEOUS) ×2 IMPLANT
BLADE SURG 15 STRL LF DISP TIS (BLADE) ×1 IMPLANT
BLADE SURG 15 STRL SS (BLADE) ×2
CANISTER SUCT 1200ML W/VALVE (MISCELLANEOUS) IMPLANT
CHLORAPREP W/TINT 26 (MISCELLANEOUS) ×2 IMPLANT
CLEANER CAUTERY TIP 5X5 PAD (MISCELLANEOUS) ×1 IMPLANT
COVER BACK TABLE 60X90IN (DRAPES) ×2 IMPLANT
COVER MAYO STAND STRL (DRAPES) ×2 IMPLANT
COVER WAND RF STERILE (DRAPES) IMPLANT
DECANTER SPIKE VIAL GLASS SM (MISCELLANEOUS) IMPLANT
DERMABOND ADVANCED (GAUZE/BANDAGES/DRESSINGS) ×1
DERMABOND ADVANCED .7 DNX12 (GAUZE/BANDAGES/DRESSINGS) ×1 IMPLANT
DRAPE C-ARM 42X72 X-RAY (DRAPES) ×2 IMPLANT
DRAPE LAPAROSCOPIC ABDOMINAL (DRAPES) ×2 IMPLANT
DRAPE UTILITY XL STRL (DRAPES) ×2 IMPLANT
ELECT REM PT RETURN 9FT ADLT (ELECTROSURGICAL) ×2
ELECTRODE REM PT RTRN 9FT ADLT (ELECTROSURGICAL) ×1 IMPLANT
GLOVE SURG ENC MOIS LTX SZ7.5 (GLOVE) ×2 IMPLANT
GOWN STRL REUS W/ TWL LRG LVL3 (GOWN DISPOSABLE) ×2 IMPLANT
GOWN STRL REUS W/TWL LRG LVL3 (GOWN DISPOSABLE) ×4
IV KIT MINILOC 20X1 SAFETY (NEEDLE) IMPLANT
KIT PORT POWER 8FR ISP CVUE (Port) ×2 IMPLANT
NDL SAFETY ECLIPSE 18X1.5 (NEEDLE) IMPLANT
NEEDLE HYPO 18GX1.5 SHARP (NEEDLE)
NEEDLE HYPO 22GX1.5 SAFETY (NEEDLE) IMPLANT
NEEDLE HYPO 25X1 1.5 SAFETY (NEEDLE) ×2 IMPLANT
NEEDLE SPNL 22GX3.5 QUINCKE BK (NEEDLE) IMPLANT
PACK BASIN DAY SURGERY FS (CUSTOM PROCEDURE TRAY) ×2 IMPLANT
PAD CLEANER CAUTERY TIP 5X5 (MISCELLANEOUS) ×1
PENCIL SMOKE EVACUATOR (MISCELLANEOUS) ×2 IMPLANT
SLEEVE SCD COMPRESS KNEE MED (MISCELLANEOUS) IMPLANT
SUT MON AB 4-0 PC3 18 (SUTURE) ×2 IMPLANT
SUT PROLENE 2 0 SH DA (SUTURE) ×2 IMPLANT
SUT SILK 2 0 TIES 17X18 (SUTURE)
SUT SILK 2-0 18XBRD TIE BLK (SUTURE) IMPLANT
SUT VIC AB 3-0 SH 27 (SUTURE) ×2
SUT VIC AB 3-0 SH 27X BRD (SUTURE) ×1 IMPLANT
SYR 10ML LL (SYRINGE) IMPLANT
SYR 5ML LL (SYRINGE) ×2 IMPLANT
SYR CONTROL 10ML LL (SYRINGE) ×4 IMPLANT
TOWEL GREEN STERILE FF (TOWEL DISPOSABLE) ×4 IMPLANT
TUBE CONNECTING 20X1/4 (TUBING) IMPLANT
YANKAUER SUCT BULB TIP NO VENT (SUCTIONS) IMPLANT

## 2020-12-13 NOTE — Discharge Instructions (Signed)
May take Tylenol after 6pm, if needed.    Post Anesthesia Home Care Instructions  Activity: Get plenty of rest for the remainder of the day. A responsible individual must stay with you for 24 hours following the procedure.  For the next 24 hours, DO NOT: -Drive a car -Paediatric nurse -Drink alcoholic beverages -Take any medication unless instructed by your physician -Make any legal decisions or sign important papers.  Meals: Start with liquid foods such as gelatin or soup. Progress to regular foods as tolerated. Avoid greasy, spicy, heavy foods. If nausea and/or vomiting occur, drink only clear liquids until the nausea and/or vomiting subsides. Call your physician if vomiting continues.  Special Instructions/Symptoms: Your throat may feel dry or sore from the anesthesia or the breathing tube placed in your throat during surgery. If this causes discomfort, gargle with warm salt water. The discomfort should disappear within 24 hours.  If you had a scopolamine patch placed behind your ear for the management of post- operative nausea and/or vomiting:  1. The medication in the patch is effective for 72 hours, after which it should be removed.  Wrap patch in a tissue and discard in the trash. Wash hands thoroughly with soap and water. 2. You may remove the patch earlier than 72 hours if you experience unpleasant side effects which may include dry mouth, dizziness or visual disturbances. 3. Avoid touching the patch. Wash your hands with soap and water after contact with the patch.

## 2020-12-13 NOTE — Progress Notes (Signed)
[place]  [phone] [phone]     ID: [name] DOB: 05/02/1961  MR#: [id number]  AYT#:[id number]  Patient Care Team: Pcp, No as PCP - General End, Harrell Gave, MD as PCP - Cardiology (Cardiology) Mauro Kaufmann, RN as Oncology Nurse Navigator Rockwell Germany, RN as Oncology Nurse Navigator Jovita Kussmaul, MD as Consulting Physician (General Surgery) Truitt Merle, MD as Consulting Physician (Hematology) Kyung Rudd, MD as Consulting Physician (Radiation Oncology) Chauncey Cruel, MD OTHER MD:  CHIEF COMPLAINT: estrogen receptor positive breast cancer  CURRENT TREATMENT: adjuvant chemotherapy pending   INTERVAL HISTORY: Cassandra Cox returns today for follow up and treatment of her estrogen receptor positive breast cancer.  She underwent port placement yesterday, 12/13/2020, in anticipation of beginning adjuvant chemotherapy today.  She will be receiving docetaxel and cyclophosphamide every 21 days x4.   REVIEW OF SYSTEMS: Elmo Putt tolerated port placements well although of course she has slight soreness there.  She tells me she is very worried because both her breasts are lumpy.  Overall though she feels well.  A detailed review of systems was otherwise stable   COVID 19 VACCINATION STATUS: Refuses vaccination   HISTORY OF CURRENT ILLNESS: From my original evaluation note:  Cassandra Cox had routine screening mammography on 06/30/2020 showing a possible abnormality in the right breast. She underwent right diagnostic mammography with tomography and right breast ultrasonography at [facility] on 07/20/2020 showing: breast density category C; 1.1 cm irregular mass in right breast at 10-11 o'clock, better seen on mammogram; no suspicious right axillary lymph nodes.  Accordingly on 08/09/2020 she proceeded to biopsy of the right breast area in question. The pathology from this procedure  showed: invasive and in situ mammary carcinoma, e-cadherin  positive, grade 3. Prognostic indicators significant for: estrogen receptor, 95% positive and progesterone receptor, 90% positive. Proliferation marker Ki67 at 30%. HER2 negative by immunohistochemistry.  She underwent right lumpectomy on 10/04/2020 under Dr. Marlou Starks. Pathology from the procedure [id number]) showed: invasive ductal carcinoma, grade 2, 1.2 cm; ductal carcinoma in situ, intermediate grade; calcifications associated with carcinoma; margins uninvolved; lymphovascular space invasion present.  Out of three biopsied lymph nodes, one revealed metastatic carcinoma (1/3). The positive lymph node showed a disrupted capsule that is suspicious for extracapsular extension.  Mammaprint was performed on the final surgical sample and revealed high risk.  The patient's subsequent history is as detailed below.   PAST MEDICAL HISTORY: Past Medical History:  Diagnosis Date  . Anxiety   . Complication of anesthesia   . Coronary artery disease    a. 11/2016 Cath/PCI: LM nl, LAD 30ost, 49m(3.0x18 Resolute Integrity DES--3.75), LCX nl, RCA nl; b. MV 11/18: negative for ischemia, EF 72%, low risk  . Depression   . Diabetes mellitus without complication ([facility]    type 2  . Diastolic dysfunction    a. 11/2016 Echo: EF 55-60%, Gr2 DD, LAE, nl RV size/fxn.  . Family history of breast cancer   . History of kidney stones   . Hyperlipemia \\[id number]\ . Hypothyroidism   . Kidney stone 08/2016  . Mitral valve prolapse   . MVP (mitral valve prolapse)   . Neuromuscular disorder ([facility]    bilateral feet  . PONV (postoperative nausea and vomiting)   . STEMI (ST elevation myocardial infarction) ([facility] 12/02/2016  . Vitamin B 12 deficiency     PAST SURGICAL HISTORY: Past Surgical History:  Procedure Laterality Date  . ABDOMINAL HYSTERECTOMY  2003   TAH due  to adenomyosis  . BREAST LUMPECTOMY WITH RADIOACTIVE SEED AND SENTINEL LYMPH NODE BIOPSY Right 10/04/2020   Procedure: RIGHT BREAST REDUCTION  LUMPECTOMY WITH RADIOACTIVE SEED AND SENTINEL LYMPH NODE BIOPSY;  Surgeon: Jovita Kussmaul, MD;  Location: [place];  Service: General;  Laterality: Right;  . BREAST REDUCTION SURGERY  1990  . BREAST REDUCTION SURGERY Bilateral 10/04/2020   Procedure: BILATERAL MAMMARY REDUCTION  (BREAST);  Surgeon: Wallace Going, DO;  Location: [place];  Service: Plastics;  Laterality: Bilateral;  . CARDIAC CATHETERIZATION N/A 12/02/2016   Procedure: Coronary Stent Intervention;  Surgeon: Nelva Bush, MD;  Location: [place] [facility];  Service: Cardiovascular;  Laterality: N/A;  . CARDIAC CATHETERIZATION N/A 12/02/2016   Procedure: Left Heart Cath and Coronary Angiography;  Surgeon: Nelva Bush, MD;  Location: [place] [facility];  Service: Cardiovascular;  Laterality: N/A;  . CARDIAC CATHETERIZATION N/A 12/02/2016   Procedure: Intravascular Ultrasound/IVUS;  Surgeon: Nelva Bush, MD;  Location: [place] [facility];  Service: Cardiovascular;  Laterality: N/A;  . CORONARY STENT INTERVENTION    . MOUTH SURGERY     Dental implant  . PORTACATH PLACEMENT Left 12/13/2020   Procedure: INSERTION PORT-A-CATH LEFT SUBCLAVIAN;  Surgeon: Jovita Kussmaul, MD;  Location: [place];  Service: General;  Laterality: Left;    FAMILY HISTORY: Family History  Problem Relation Age of Onset  . Heart disease Mother   . Diabetes Mother   . Stroke Mother   . Heart attack Mother   . Atrial fibrillation Mother   . Hypertension Mother   . Hyperlipidemia Mother   . COPD Father   . Hypertension Father   . Hyperlipidemia Father   . Breast cancer Maternal Aunt 73       x2 age 28  . Diabetes Maternal Grandfather    Her father died at age 42 and her mother at age 10. Cassandra Cox is an only child. She reports breast cancer in a maternal aunt two separate times, in her 66's and then in her 57's.   GYNECOLOGIC HISTORY:  No LMP recorded. Patient has had a  hysterectomy. Menarche: 60 years old Age at first live birth: 60 years old [place] P 2 LMP 07/1997 (having periods at time of hysterectomy) Contraceptive: used oral contraceptives for 17 years without complications ([id number]) HRT used progesterone for 2-3 years, stopped with cancer diagnosis Hysterectomy? Yes, 1998 for adenomyosis BSO? 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 She lives by herself with he dachshund. Daughter Carmell [place] is 61, lives in [place], and is a homemaker; she suffers from [employer]. Daughter [place] is 63, lives in [place] and works as a Psychologist, educational.  The patient has 1 grandchild.  She is a Psychologist, forensic.    ADVANCED DIRECTIVES:    HEALTH MAINTENANCE: Social History   Tobacco Use  . Smoking status: Never Smoker  . Smokeless tobacco: Never Used  Vaping Use  . Vaping Use: Never used  Substance Use Topics  . Alcohol use: No  . Drug use: No     Colonoscopy:   PAP:   Bone density:    Allergies  Allergen Reactions  . Prednisone Other (See Comments)    REACTION: Swelling REACTION: Swelling Facial swelling REACTION: Swelling  . Sulfa Antibiotics Hives and Other (See Comments)    REACTION: Hives  . Sulfasalazine     REACTION: Hives  . Sulfonamide Derivatives     REACTION:  Hives    Current Outpatient Medications  Medication Sig Dispense Refill  . ALPRAZolam (XANAX) 0.25 MG tablet Take 0.25 mg by mouth 3 (three) times daily as needed for anxiety.    Francia Greaves THYROID 60 MG tablet Take 60 mg by mouth daily.    Marland Kitchen aspirin EC 81 MG tablet Take 81 mg by mouth daily.    Marland Kitchen atorvastatin (LIPITOR) 80 MG tablet TAKE 1 TABLET BY MOUTH DAILY AT 6PM GENERIC EQUIVALENT FOR LIPITOR (Patient taking differently: Take 80 mg by mouth daily.) 90 tablet 3  . buPROPion (WELLBUTRIN XL) 150 MG 24 hr tablet Take 300 mg by mouth daily.     . Cholecalciferol (VITAMIN D-3) 5000 UNITS TABS Take 1 tablet by mouth daily.     . clopidogrel (PLAVIX) 75 MG tablet TAKE 1 TABLET BY MOUTH DAILY GENERIC EQUIVALENT FOR PLAVIX 90 tablet 3  . Coenzyme Q10 (COQ10) 100 MG CAPS Take 100 mg by mouth daily.    . Cyanocobalamin 1000 MCG/ML KIT Inject 1,000 mcg as directed once a week.    Marland Kitchen dexamethasone (DECADRON) 4 MG tablet Take 1 tablet (4 mg total) by mouth 2 (two) times daily. Start the day before Taxotere. Then 1 tab daily the day after chemo for 2 days. 30 tablet 1  . ezetimibe (ZETIA) 10 MG tablet TAKE 1 TABLET BY MOUTH DAILY. (Patient taking differently: Take 10 mg by mouth daily.) 90 tablet 3  . FLUoxetine (PROZAC) 20 MG capsule Take 20 mg by mouth daily.     Marland Kitchen HYDROcodone-acetaminophen (NORCO/VICODIN) 5-325 MG tablet Take 1-2 tablets by mouth every 6 (six) hours as needed for moderate pain or severe pain. 10 tablet 0  . ketoconazole (NIZORAL) 2 % cream Apply 1 application topically daily. 15 g 0  . levocetirizine (XYZAL) 5 MG tablet Take 1 tablet at bedtime by mouth.  2  . lidocaine-prilocaine (EMLA) cream Apply to affected area once 30 g 3  . liothyronine (CYTOMEL) 5 MCG tablet Take 5 mcg by mouth every morning.     . magnesium gluconate (MAGONATE) 500 MG tablet Take 1,000 mg by mouth at bedtime.    . metFORMIN (GLUCOPHAGE-XR) 500 MG 24 hr tablet Take 500 mg by mouth 2 (two) times daily.    . montelukast (SINGULAIR) 10 MG tablet Take 10 mg by mouth every morning.    Marland Kitchen NALTREXONE HCL PO Take 4 mg by mouth at bedtime.     . nitroGLYCERIN (NITROSTAT) 0.4 MG SL tablet Place 1 tablet (0.4 mg total) under the tongue every 5 (five) minutes as needed for chest pain. For maximum of 3 doses. 25 tablet 3  . ondansetron (ZOFRAN) 8 MG tablet Take 1 tablet (8 mg total) by mouth 2 (two) times daily as needed for refractory nausea / vomiting. Start on day 3 after chemo. 30 tablet 1  . prochlorperazine (COMPAZINE) 10 MG tablet Take 1 tablet (10 mg total) by mouth every 6 (six) hours as needed (Nausea or vomiting). 30 tablet 1  . vitamin  C (ASCORBIC ACID) 500 MG tablet Take 500 mg by mouth daily.    Marland Kitchen zinc gluconate 50 MG tablet Take 50 mg by mouth daily.    Marland Kitchen zolpidem (AMBIEN CR) 12.5 MG CR tablet Take 12.5 mg by mouth at bedtime as needed for sleep.     No current facility-administered medications for this visit.   Facility-Administered Medications Ordered in Other Visits  Medication Dose Route Frequency Provider Last Rate Last Admin  . sodium chloride  flush (NS) 0.9 % injection 10 mL  10 mL Intracatheter PRN Truitt Merle, MD   10 mL at 12/14/20 1555    OBJECTIVE: White woman who appears stated age  Vitals:   12/14/20 1032  BP: (!) 145/73  Pulse: 89  Resp: 17  Temp: 97.9 F (36.6 C)  SpO2: 98%     Body mass index is 32.95 kg/m.   Wt Readings from Last 3 Encounters:  12/14/20 186 lb (84.4 kg)  12/13/20 184 lb 15.5 oz (83.9 kg)  12/07/20 183 lb 6 oz (83.2 kg)      ECOG FS:1 - Symptomatic but completely ambulatory  Sclerae unicteric, EOMs intact Wearing a mask No cervical or supraclavicular adenopathy Lungs no rales or rhonchi Heart regular rate and rhythm Abd soft, nontender, positive bowel sounds MSK no focal spinal tenderness, no upper extremity lymphedema Neuro: nonfocal, well oriented, appropriate affect Breasts: Deferred   LAB RESULTS:  CMP     Component Value Date/Time   NA 138 12/14/2020 1020   NA 140 03/03/2020 1052   K 4.1 12/14/2020 1020   CL 107 12/14/2020 1020   CO2 23 12/14/2020 1020   GLUCOSE 153 (H) 12/14/2020 1020   BUN 16 12/14/2020 1020   BUN 18 03/03/2020 1052   CREATININE 0.85 12/14/2020 1020   CALCIUM 9.1 12/14/2020 1020   PROT 6.9 12/14/2020 1020   PROT 6.5 03/03/2020 1052   ALBUMIN 3.9 12/14/2020 1020   ALBUMIN 4.4 03/03/2020 1052   AST 14 (L) 12/14/2020 1020   ALT 25 12/14/2020 1020   ALKPHOS 83 12/14/2020 1020   BILITOT 0.3 12/14/2020 1020   GFRNONAA >60 12/14/2020 1020   GFRAA 111 03/03/2020 1052    No results found for: TOTALPROTELP, ALBUMINELP, A1GS, A2GS,  BETS, BETA2SER, GAMS, MSPIKE, SPEI  Lab Results  Component Value Date   WBC 14.1 (H) 12/14/2020   NEUTROABS 11.7 (H) 12/14/2020   HGB 11.4 (L) 12/14/2020   HCT 35.3 (L) 12/14/2020   MCV 91.5 12/14/2020   PLT 297 12/14/2020    No results found for: LABCA2  No components found for: [id number]  No results for input(s): INR in the last 168 hours.  No results found for: LABCA2  No results found for: EXN170  No results found for: YFV494  No results found for: WHQ759  No results found for: CA2729  No components found for: HGQUANT  No results found for: CEA1 / No results found for: CEA1   No results found for: AFPTUMOR  No results found for: CHROMOGRNA  No results found for: KPAFRELGTCHN, LAMBDASER, KAPLAMBRATIO (kappa/lambda light chains)  No results found for: HGBA, HGBA2QUANT, HGBFQUANT, HGBSQUAN (Hemoglobinopathy evaluation)   No results found for: LDH  No results found for: IRON, TIBC, IRONPCTSAT (Iron and TIBC)  No results found for: FERRITIN  Urinalysis    Component Value Date/Time   COLORURINE RED (A) 08/28/2016 1428   APPEARANCEUR CLOUDY (A) 08/28/2016 1428   LABSPEC 1.017 08/28/2016 1428   PHURINE 7.0 08/28/2016 1428   GLUCOSEU NEGATIVE 08/28/2016 1428   HGBUR LARGE (A) 08/28/2016 1428   BILIRUBINUR NEG 09/25/2017 1443   KETONESUR 15 (A) 08/28/2016 1428   PROTEINUR NEG 09/25/2017 1443   PROTEINUR NEGATIVE 08/28/2016 1428   UROBILINOGEN negative 06/12/2016 1610   NITRITE NEG 09/25/2017 1443   NITRITE NEGATIVE 08/28/2016 1428   LEUKOCYTESUR Negative 09/25/2017 1443    STUDIES: DG Chest Port 1 View  Result Date: 12/13/2020 CLINICAL DATA:  Status post Port-A-Cath placement. EXAM: PORTABLE CHEST 1  VIEW COMPARISON:  September 14, 2017. FINDINGS: Mild cardiomegaly is noted. No pneumothorax or pleural effusion is noted. Interval placement of left subclavian catheter with distal tip in expected position of the SVC. Lungs are clear. Bony thorax is  unremarkable. IMPRESSION: Interval placement of left subclavian catheter with distal tip in expected position of the SVC. No pneumothorax is noted. Electronically Signed   By: Marijo Conception M.D.   On: 12/13/2020 14:22   DG Fluoro Guide CV Line-No Report  Result Date: 12/13/2020 Fluoroscopy was utilized by the requesting physician.  No radiographic interpretation.     ELIGIBLE FOR AVAILABLE RESEARCH PROTOCOL: AET  ASSESSMENT: 60 y.o. [place] woman status post right lumpectomy and sentinel lymph node sampling 10/04/2020 for a pT1c pN1, stage IB   invasive ductal carcinoma, grade 2, estrogen and progesterone receptor positive, HER2 not amplified, with an MIB-1 of 30%  (a) a total of 3 right axillary lymph nodes removed, one positive with concern regarding extracapsular extension  (1) MammaPrint high risk predicts a significant benefit from chemotherapy  (2) adjuvant chemotherapy will consist of cyclophosphamide and docetaxel every 21 days x 4 starting 12/14/2020  (3) adjuvant radiation to follow.  (4) antiestrogens to start at the completion of local treatment   PLAN:  Cincere has a good understanding of her situation and the overall plan and is ready to start chemotherapy today.  We reviewed the possible toxicities side effects and complications and I also gave her a routing sheet on how to take all her supportive medications.  We went over that in detail so she would understand how to read it appropriately.  She will return in 2 days for her immune booster and then she will see me in 1 week.  I have asked her to keep a diary of symptoms so we can troubleshoot any problems that may develop from her treatments.  She does have some of the mild narcotics given by surgery that she may use if she develops significant pain from the immune booster shot  Total encounter time 25 minutes.Sarajane Jews C. Kayla Weekes, MD 12/14/2020 7:23 PM Medical Oncology and Hematology [facility] [place], [place] [address] Tel. [phone]    Fax. [phone]   This document serves as a record of services personally performed by Lurline Del, MD. It was created on his behalf by Wilburn Mylar, a trained medical scribe. The creation of this record is based on the scribe's personal observations and the provider's statements to them.   I, Lurline Del MD, have reviewed the above documentation for accuracy and completeness, and I agree with the above.   *Total Encounter Time as defined by the Centers for Medicare and Medicaid Services includes, in addition to the face-to-face time of a patient visit (documented in the note above) non-face-to-face time: obtaining and reviewing outside history, ordering and reviewing medications, tests or procedures, care coordination (communications with other health care professionals or caregivers) and documentation in the medical record.

## 2020-12-13 NOTE — Transfer of Care (Signed)
Immediate Anesthesia Transfer of Care Note  Patient: Cassandra Cox  Procedure(s) Performed: INSERTION PORT-A-CATH LEFT SUBCLAVIAN (Left Breast)  Patient Location: PACU  Anesthesia Type:General  Level of Consciousness: drowsy and patient cooperative  Airway & Oxygen Therapy: Patient Spontanous Breathing and Patient connected to face mask oxygen  Post-op Assessment: Report given to RN and Post -op Vital signs reviewed and stable  Post vital signs: Reviewed and stable  Last Vitals:  Vitals Value Taken Time  BP 104/79 12/13/20 1341  Temp    Pulse 75 12/13/20 1342  Resp 7 12/13/20 1342  SpO2 95 % 12/13/20 1342  Vitals shown include unvalidated device data.  Last Pain:  Vitals:   12/13/20 1151  TempSrc: Oral  PainSc: 0-No pain      Patients Stated Pain Goal: 3 (83/07/35 4301)  Complications: No complications documented.

## 2020-12-13 NOTE — Op Note (Signed)
12/13/2020  1:35 PM  PATIENT:  Cassandra Cox  60 y.o. female  PRE-OPERATIVE DIAGNOSIS:  RIGHT BREAST CANCER  POST-OPERATIVE DIAGNOSIS:  RIGHT BREAST CANCER  PROCEDURE:  Procedure(s): INSERTION PORT-A-CATH LEFT SUBCLAVIAN (Left)  SURGEON:  Surgeon(s) and Role:    * Jovita Kussmaul, MD - Primary  PHYSICIAN ASSISTANT:   ASSISTANTS: none   ANESTHESIA:   local and general  EBL:  5 mL   BLOOD ADMINISTERED:none  DRAINS: none   LOCAL MEDICATIONS USED:  MARCAINE     SPECIMEN:  No Specimen  DISPOSITION OF SPECIMEN:  N/A  COUNTS:  YES  TOURNIQUET:  * No tourniquets in log *  DICTATION: .Dragon Dictation   After informed consent was obtained the patient was brought to the operating room and placed in the supine position on the operating table.  After adequate induction of general anesthesia a roll was placed between the patient's shoulder blades to extend the shoulder slightly.  The left chest and neck area were then prepped with ChloraPrep, allowed to dry, and draped in usual sterile manner.  An appropriate timeout was performed.  The area lateral to the bend of the clavicle was infiltrated with quarter percent Marcaine.  The patient was placed in Trendelenburg position.  A large bore needle from the Port-A-Cath kit was used to slide beneath the bend of the clavicle heading towards the sternal notch and in doing so I was able to access the left subclavian vein.  A wire was fed through the needle using the Seldinger technique.  The wire was confirmed in the central venous system using real-time fluoroscopy.  Next a small incision was made at the wire entry site with a 15 blade knife.  The incision was carried through the skin and subcutaneous tissue sharply with the electrocautery.  Blunt finger dissection was carried out to create a subcutaneous pocket inferior to the incision.  Next the tubing was placed on the reservoir.  The reservoir was placed in the pocket and the length of the  tubing was estimated using real-time fluoroscopy.  The tubing was cut to the appropriate length.  Next the sheath and dilator were fed over the wire using the Seldinger technique.  The dilator and wire were removed from the patient.  The tubing was then fed through the sheath as far as it would go and then held in place while the sheath was gently cracked and separated.  Another real-time fluoroscopy image showed the tip of the catheter to be in the distal superior vena cava.  The tubing was then permanently anchored to the reservoir.  The reservoir was anchored in the pocket with two 2-0 Prolene stitches.  The port was then aspirated and it aspirated blood easily.  The port was then flushed initially with a dilute heparin solution and then with a more concentrated heparin solution.  The subcutaneous tissue was then closed over the port with interrupted 3-0 Vicryl stitches.  The skin was closed with a running 4-0 Monocryl subcuticular stitch.  Dermabond dressings were applied.  The patient tolerated the procedure well.  All needle sponge and instrument counts were correct.  The patient was then awakened and taken to recovery in stable condition.  PLAN OF CARE: Discharge to home after PACU  PATIENT DISPOSITION:  PACU - hemodynamically stable.   Delay start of Pharmacological VTE agent (>24hrs) due to surgical blood loss or risk of bleeding: not applicable

## 2020-12-13 NOTE — H&P (Addendum)
Cassandra Cox  Location: [facility] Patient #: [id number] DOB: 10-20-1961 Divorced / Language: Cleophus Molt / Race: White Female   History of Present Illness  The patient is a 60 year old female who presents for a follow-up for Breast cancer. The patient is a 60 year old white female who is about 2 weeks status post right breast lumpectomy and sentinel node biopsy for a T1 cN1 A right breast cancer that was ER/PR positive and HER-2 negative with a Ki-67 of 30%. She did have 1 of 3 nodes that was positive. Her mammoprint was high risk so they did recommend chemotherapy for her. She is undecided about whether she will accept this. She denies any significant breast pain.  Past Surgical History  Breast Augmentation  Bilateral. Breast Biopsy  Right. Hysterectomy (not due to cancer) - Partial   Diagnostic Studies History  Colonoscopy  5-10 years ago Mammogram  within last year Pap Smear  1-5 years ago  Medication History Medications Reconciled  Social History No alcohol use  No caffeine use  No drug use  Tobacco use  Never smoker.  Family History Arthritis  Mother. Breast Cancer  Family Members In General. Cerebrovascular Accident  Mother. Depression  Mother. Diabetes Mellitus  Mother. Heart Disease  Mother. Hypertension  Father, Mother. Migraine Headache  Mother.  Pregnancy / Birth History Age at menarche  73 years. Contraceptive History  Depo-provera, Oral contraceptives. Gravida  2 Length (months) of breastfeeding  3-6 Maternal age  67-30 Para  2 Regular periods   Other Problems  Anxiety Disorder  Depression  Diverticulosis  Hemorrhoids  Hypercholesterolemia  Kidney Stone  Myocardial infarction  Thyroid Disease  Allergies PredniSONE (Pak) *CORTICOSTEROIDS*  Swelling. Facial Swelling Sulfa Antibiotics  Hives. Sulfonamide Derivatives  Hives. Allergies Reconciled   Medication History  Xanax (0.25MG Tablet,  Oral) Active. Armour Thyroid (60MG Tablet, Oral) Active. Aspirin (81MG Tablet DR, Oral) Active. Lipitor (80MG Tablet, Oral) Active. Wellbutrin XL (150MG Tablet ER 24HR, Oral) Active. Cholecalciferol (250 MCG(10000 UT) Capsule, Oral) Active. Plavix (75MG Tablet, Oral) Active. Cyanocobalamin (1000MCG Tablet Chewable, Oral) Active. Zetia (10MG Tablet, Oral) Active. PROzac (20MG Capsule, Oral) Active. Xyzal Allergy 24HR (5MG Tablet, Oral) Active. Cytomel (5MCG Tablet, Oral) Active. Magonate (500 (27 Mg)MG Tablet, Oral) Active. metFORMIN HCl ER (500MG Tablet ER 24HR, Oral) Active. Singulair (10MG Tablet, Oral) Active. Nitrostat (0.4MG Tab Sublingual, Sublingual) Active. Progesterone (200MG Capsule, Oral) Active. Ambien CR (12.5MG Tablet ER, Oral) Active. Medications Reconciled  Vitals  Weight: 183.8 lb Height: 63in Body Surface Area: 1.87 m Body Mass Index: 32.56 kg/m  Temp.: 98.22F  Pulse: 92 (Regular)  BP: 132/84(Sitting, Left Arm, Standard)       Physical Exam  Breast Note: The reduction lumpectomy scars on both breasts are healing nicely with no sign of infection or seroma Physical Exam  General Mental Status-Alert. General Appearance-Consistent with stated age. Hydration-Well hydrated. Voice-Normal.  Head and Neck Head-normocephalic, atraumatic with no lesions or palpable masses. Trachea-midline. Thyroid Gland Characteristics - normal size and consistency.  Eye Eyeball - Bilateral-Extraocular movements intact. Sclera/Conjunctiva - Bilateral-No scleral icterus.  Chest and Lung Exam Chest and lung exam reveals -quiet, even and easy respiratory effort with no use of accessory muscles and on auscultation, normal breath sounds, no adventitious sounds and normal vocal resonance. Inspection Chest Wall - Normal. Back - normal.  Breast Note: there is no palpable mass in either breast. there is no palpable axillary,  supraclavicular, or cervical lymphadenopathy   Cardiovascular Cardiovascular examination reveals -normal heart sounds, regular  rate and rhythm with no murmurs and normal pedal pulses bilaterally.  Abdomen Inspection Inspection of the abdomen reveals - No Hernias. Skin - Scar - no surgical scars. Palpation/Percussion Palpation and Percussion of the abdomen reveal - Soft, Non Tender, No Rebound tenderness, No Rigidity (guarding) and No hepatosplenomegaly. Auscultation Auscultation of the abdomen reveals - Bowel sounds normal.  Neurologic Neurologic evaluation reveals -alert and oriented x 3 with no impairment of recent or remote memory. Mental Status-Normal.  Musculoskeletal Normal Exam - Left-Upper Extremity Strength Normal and Lower Extremity Strength Normal. Normal Exam - Right-Upper Extremity Strength Normal and Lower Extremity Strength Normal.  Lymphatic Head & Neck  General Head & Neck Lymphatics: Bilateral - Description - Normal. Axillary  General Axillary Region: Bilateral - Description - Normal. Tenderness - Non Tender. Femoral & Inguinal  Generalized Femoral & Inguinal Lymphatics: Bilateral - Description - Normal. Tenderness - Non Tender.    Assessment & Plan MALIGNANT NEOPLASM OF CENTRAL PORTION OF RIGHT BREAST IN FEMALE, ESTROGEN RECEPTOR POSITIVE (C50.111) Impression: The patient is about 2 weeks status post right breast lumpectomy for breast cancer. She tolerated the surgery well. Her margins were clean and one of 3 nodes was positive. She will not likely need any further surgery based on the Z 11 study. At this point she will need to follow up with medical and radiation oncology to discuss adjuvant therapy. I will plan to see her back in about 6 months. Current Plans Follow up with [place] in the office in 6 MONTHS.   Call us sooner as needed.  Plan for port placement for chemo. Risks and benefits discussed with pt and she understands and wishes to  proceed

## 2020-12-13 NOTE — Anesthesia Procedure Notes (Signed)
Procedure Name: LMA Insertion Date/Time: 12/13/2020 12:52 PM Performed by: Genelle Bal, CRNA Pre-anesthesia Checklist: Patient identified, Emergency Drugs available, Suction available and Patient being monitored Patient Re-evaluated:Patient Re-evaluated prior to induction Oxygen Delivery Method: Circle system utilized Preoxygenation: Pre-oxygenation with 100% oxygen Induction Type: IV induction Ventilation: Mask ventilation without difficulty LMA: LMA inserted LMA Size: 4.0 Number of attempts: 1 Airway Equipment and Method: Bite block Placement Confirmation: positive ETCO2 Tube secured with: Tape Dental Injury: Teeth and Oropharynx as per pre-operative assessment

## 2020-12-13 NOTE — Interval H&P Note (Signed)
History and Physical Interval Note:  12/13/2020 12:30 PM  Cassandra Cox  has presented today for surgery, with the diagnosis of RIGHT BREAST CANCER.  The various methods of treatment have been discussed with the patient and family. After consideration of risks, benefits and other options for treatment, the patient has consented to  Procedure(s): INSERTION PORT-A-CATH WITH ULTRASOUND GUIDANCE (N/A) as a surgical intervention.  The patient's history has been reviewed, patient examined, no change in status, stable for surgery.  I have reviewed the patient's chart and labs.  Questions were answered to the patient's satisfaction.     [name]

## 2020-12-13 NOTE — Anesthesia Postprocedure Evaluation (Signed)
Anesthesia Post Note  Patient: [name]  Procedure(s) Performed: INSERTION PORT-A-CATH LEFT SUBCLAVIAN (Left Breast)     Patient location during evaluation: PACU Anesthesia Type: General Level of consciousness: awake and alert Pain management: pain level controlled Vital Signs Assessment: post-procedure vital signs reviewed and stable Respiratory status: spontaneous breathing, nonlabored ventilation and respiratory function stable Cardiovascular status: blood pressure returned to baseline and stable Postop Assessment: no apparent nausea or vomiting Anesthetic complications: no   No complications documented.  Last Vitals:  Vitals:   12/13/20 1415 12/13/20 1430  BP: 118/75 123/79  Pulse: 84 87  Resp: 13 16  Temp:  36.7 C  SpO2: 99% 96%    Last Pain:  Vitals:   12/13/20 1430  TempSrc:   PainSc: 0-No pain                 Lidia Collum

## 2020-12-14 ENCOUNTER — Inpatient Hospital Stay: Payer: 59

## 2020-12-14 ENCOUNTER — Inpatient Hospital Stay: Payer: 59 | Attending: Genetic Counselor

## 2020-12-14 ENCOUNTER — Encounter (HOSPITAL_BASED_OUTPATIENT_CLINIC_OR_DEPARTMENT_OTHER): Payer: Self-pay | Admitting: General Surgery

## 2020-12-14 ENCOUNTER — Inpatient Hospital Stay (HOSPITAL_BASED_OUTPATIENT_CLINIC_OR_DEPARTMENT_OTHER): Payer: 59 | Admitting: Oncology

## 2020-12-14 ENCOUNTER — Encounter: Payer: Self-pay | Admitting: *Deleted

## 2020-12-14 ENCOUNTER — Other Ambulatory Visit: Payer: Self-pay

## 2020-12-14 ENCOUNTER — Other Ambulatory Visit: Payer: Self-pay | Admitting: Hematology

## 2020-12-14 VITALS — BP 145/73 | HR 89 | Temp 97.9°F | Resp 17 | Ht 63.0 in | Wt 186.0 lb

## 2020-12-14 VITALS — BP 137/76 | HR 93 | Temp 98.2°F | Resp 16

## 2020-12-14 DIAGNOSIS — C50411 Malignant neoplasm of upper-outer quadrant of right female breast: Secondary | ICD-10-CM

## 2020-12-14 DIAGNOSIS — Z79899 Other long term (current) drug therapy: Secondary | ICD-10-CM | POA: Insufficient documentation

## 2020-12-14 DIAGNOSIS — I252 Old myocardial infarction: Secondary | ICD-10-CM | POA: Diagnosis not present

## 2020-12-14 DIAGNOSIS — Z170 Estrogen receptor positive status [ER+]: Secondary | ICD-10-CM

## 2020-12-14 DIAGNOSIS — Z803 Family history of malignant neoplasm of breast: Secondary | ICD-10-CM | POA: Diagnosis not present

## 2020-12-14 DIAGNOSIS — Z5189 Encounter for other specified aftercare: Secondary | ICD-10-CM | POA: Insufficient documentation

## 2020-12-14 DIAGNOSIS — C773 Secondary and unspecified malignant neoplasm of axilla and upper limb lymph nodes: Secondary | ICD-10-CM | POA: Diagnosis present

## 2020-12-14 DIAGNOSIS — Z90710 Acquired absence of both cervix and uterus: Secondary | ICD-10-CM | POA: Diagnosis not present

## 2020-12-14 DIAGNOSIS — Z95828 Presence of other vascular implants and grafts: Secondary | ICD-10-CM | POA: Insufficient documentation

## 2020-12-14 DIAGNOSIS — Z5111 Encounter for antineoplastic chemotherapy: Secondary | ICD-10-CM | POA: Insufficient documentation

## 2020-12-14 LAB — CBC WITH DIFFERENTIAL (CANCER CENTER ONLY)
Abs Immature Granulocytes: 0.08 10*3/uL — ABNORMAL HIGH (ref 0.00–0.07)
Basophils Absolute: 0 10*3/uL (ref 0.0–0.1)
Basophils Relative: 0 %
Eosinophils Absolute: 0 10*3/uL (ref 0.0–0.5)
Eosinophils Relative: 0 %
HCT: 35.3 % — ABNORMAL LOW (ref 36.0–46.0)
Hemoglobin: 11.4 g/dL — ABNORMAL LOW (ref 12.0–15.0)
Immature Granulocytes: 1 %
Lymphocytes Relative: 10 %
Lymphs Abs: 1.4 10*3/uL (ref 0.7–4.0)
MCH: 29.5 pg (ref 26.0–34.0)
MCHC: 32.3 g/dL (ref 30.0–36.0)
MCV: 91.5 fL (ref 80.0–100.0)
Monocytes Absolute: 0.9 10*3/uL (ref 0.1–1.0)
Monocytes Relative: 6 %
Neutro Abs: 11.7 10*3/uL — ABNORMAL HIGH (ref 1.7–7.7)
Neutrophils Relative %: 83 %
Platelet Count: 297 10*3/uL (ref 150–400)
RBC: 3.86 MIL/uL — ABNORMAL LOW (ref 3.87–5.11)
RDW: 14.9 % (ref 11.5–15.5)
WBC Count: 14.1 10*3/uL — ABNORMAL HIGH (ref 4.0–10.5)
nRBC: 0 % (ref 0.0–0.2)

## 2020-12-14 LAB — CMP (CANCER CENTER ONLY)
ALT: 25 U/L (ref 0–44)
AST: 14 U/L — ABNORMAL LOW (ref 15–41)
Albumin: 3.9 g/dL (ref 3.5–5.0)
Alkaline Phosphatase: 83 U/L (ref 38–126)
Anion gap: 8 (ref 5–15)
BUN: 16 mg/dL (ref 8–23)
CO2: 23 mmol/L (ref 22–32)
Calcium: 9.1 mg/dL (ref 8.9–10.3)
Chloride: 107 mmol/L (ref 98–111)
Creatinine: 0.85 mg/dL (ref 0.44–1.00)
GFR, Estimated: >60 mL/min (ref >60)
Glucose, Bld: 153 mg/dL — ABNORMAL HIGH (ref 70–99)
Potassium: 4.1 mmol/L (ref 3.5–5.1)
Sodium: 138 mmol/L (ref 135–145)
Total Bilirubin: 0.3 mg/dL (ref 0.3–1.2)
Total Protein: 6.9 g/dL (ref 6.5–8.1)

## 2020-12-14 MED ORDER — SODIUM CHLORIDE 0.9 % IV SOLN
600.0000 mg/m2 | Freq: Once | INTRAVENOUS | Status: AC
  Administered 2020-12-14: 1160 mg via INTRAVENOUS
  Filled 2020-12-14: qty 58

## 2020-12-14 MED ORDER — PALONOSETRON HCL INJECTION 0.25 MG/5ML
0.2500 mg | Freq: Once | INTRAVENOUS | Status: AC
  Administered 2020-12-14: 0.25 mg via INTRAVENOUS

## 2020-12-14 MED ORDER — SODIUM CHLORIDE 0.9 % IV SOLN
10.0000 mg | Freq: Once | INTRAVENOUS | Status: AC
  Administered 2020-12-14: 10 mg via INTRAVENOUS
  Filled 2020-12-14: qty 10

## 2020-12-14 MED ORDER — SODIUM CHLORIDE 0.9% FLUSH
10.0000 mL | Freq: Once | INTRAVENOUS | Status: AC
  Administered 2020-12-14: 10 mL
  Filled 2020-12-14: qty 10

## 2020-12-14 MED ORDER — SODIUM CHLORIDE 0.9 % IV SOLN
75.0000 mg/m2 | Freq: Once | INTRAVENOUS | Status: AC
  Administered 2020-12-14: 140 mg via INTRAVENOUS
  Filled 2020-12-14: qty 14

## 2020-12-14 MED ORDER — SODIUM CHLORIDE 0.9% FLUSH
10.0000 mL | INTRAVENOUS | Status: DC | PRN
  Administered 2020-12-14: 10 mL
  Filled 2020-12-14: qty 10

## 2020-12-14 MED ORDER — HEPARIN SOD (PORK) LOCK FLUSH 100 UNIT/ML IV SOLN
500.0000 [IU] | Freq: Once | INTRAVENOUS | Status: AC | PRN
  Administered 2020-12-14: 500 [IU]
  Filled 2020-12-14: qty 5

## 2020-12-14 MED ORDER — PALONOSETRON HCL INJECTION 0.25 MG/5ML
INTRAVENOUS | Status: AC
  Filled 2020-12-14: qty 5

## 2020-12-14 MED ORDER — SODIUM CHLORIDE 0.9 % IV SOLN
Freq: Once | INTRAVENOUS | Status: AC
Start: 2020-12-14 — End: 2020-12-14
  Filled 2020-12-14: qty 250

## 2020-12-14 NOTE — Patient Instructions (Signed)
[facility] Discharge Instructions for Patients Receiving Chemotherapy  Today you received the following chemotherapy agents: Docetaxel, Cyclophosphamide.  To help prevent nausea and vomiting after your treatment, we encourage you to take your nausea medication as directed.   If you develop nausea and vomiting that is not controlled by your nausea medication, call the clinic.   BELOW ARE SYMPTOMS THAT SHOULD BE REPORTED IMMEDIATELY:  *FEVER GREATER THAN 100.5 F  *CHILLS WITH OR WITHOUT FEVER  NAUSEA AND VOMITING THAT IS NOT CONTROLLED WITH YOUR NAUSEA MEDICATION  *UNUSUAL SHORTNESS OF BREATH  *UNUSUAL BRUISING OR BLEEDING  TENDERNESS IN MOUTH AND THROAT WITH OR WITHOUT PRESENCE OF ULCERS  *URINARY PROBLEMS  *BOWEL PROBLEMS  UNUSUAL RASH Items with * indicate a potential emergency and should be followed up as soon as possible.  Feel free to call the clinic should you have any questions or concerns. The clinic phone number is [phone].  Please show the [place] at check-in to the Emergency Department and triage nurse.  Docetaxel injection What is this medicine? DOCETAXEL (doe se TAX el) is a chemotherapy drug. It targets fast dividing cells, like cancer cells, and causes these cells to die. This medicine is used to treat many types of cancers like breast cancer, certain stomach cancers, head and neck cancer, lung cancer, and prostate cancer. This medicine may be used for other purposes; ask your health care provider or pharmacist if you have questions. COMMON BRAND NAME(S): Docefrez, Taxotere What should I tell my health care provider before I take this medicine? They need to know if you have any of these conditions:  infection (especially a virus infection such as chickenpox, cold sores, or herpes)  liver disease  low blood counts, like low white cell, platelet, or red cell counts  an unusual or allergic reaction to docetaxel, polysorbate 80,  other chemotherapy agents, other medicines, foods, dyes, or preservatives  pregnant or trying to get pregnant  breast-feeding How should I use this medicine? This drug is given as an infusion into a vein. It is administered in a hospital or clinic by a specially trained health care professional. Talk to your pediatrician regarding the use of this medicine in children. Special care may be needed. Overdosage: If you think you have taken too much of this medicine contact a poison control center or emergency room at once. NOTE: This medicine is only for you. Do not share this medicine with others. What if I miss a dose? It is important not to miss your dose. Call your doctor or health care professional if you are unable to keep an appointment. What may interact with this medicine? Do not take this medicine with any of the following medications:  live virus vaccines This medicine may also interact with the following medications:  aprepitant  certain antibiotics like erythromycin or clarithromycin  certain antivirals for HIV or hepatitis  certain medicines for fungal infections like fluconazole, itraconazole, ketoconazole, posaconazole, or voriconazole  cimetidine  ciprofloxacin  conivaptan  cyclosporine  dronedarone  fluvoxamine  grapefruit juice  imatinib  verapamil This list may not describe all possible interactions. Give your health care provider a list of all the medicines, herbs, non-prescription drugs, or dietary supplements you use. Also tell them if you smoke, drink alcohol, or use illegal drugs. Some items may interact with your medicine. What should I watch for while using this medicine? Your condition will be monitored carefully while you are receiving this medicine. You will need important  blood work done while you are taking this medicine. Call your doctor or health care professional for advice if you get a fever, chills or sore throat, or other symptoms of a  cold or flu. Do not treat yourself. This drug decreases your body's ability to fight infections. Try to avoid being around people who are sick. Some products may contain alcohol. Ask your health care professional if this medicine contains alcohol. Be sure to tell all health care professionals you are taking this medicine. Certain medicines, like metronidazole and disulfiram, can cause an unpleasant reaction when taken with alcohol. The reaction includes flushing, headache, nausea, vomiting, sweating, and increased thirst. The reaction can last from 30 minutes to several hours. You may get drowsy or dizzy. Do not drive, use machinery, or do anything that needs mental alertness until you know how this medicine affects you. Do not stand or sit up quickly, especially if you are an older patient. This reduces the risk of dizzy or fainting spells. Alcohol may interfere with the effect of this medicine. Talk to your health care professional about your risk of cancer. You may be more at risk for certain types of cancer if you take this medicine. Do not become pregnant while taking this medicine or for 6 months after stopping it. Women should inform their doctor if they wish to become pregnant or think they might be pregnant. There is a potential for serious side effects to an unborn child. Talk to your health care professional or pharmacist for more information. Do not breast-feed an infant while taking this medicine or for 1 week after stopping it. Males who get this medicine must use a condom during sex with females who can get pregnant. If you get a woman pregnant, the baby could have birth defects. The baby could die before they are born. You will need to continue wearing a condom for 3 months after stopping the medicine. Tell your health care provider right away if your partner becomes pregnant while you are taking this medicine. This may interfere with the ability to father a child. You should talk to your  doctor or health care professional if you are concerned about your fertility. What side effects may I notice from receiving this medicine? Side effects that you should report to your doctor or health care professional as soon as possible:  allergic reactions like skin rash, itching or hives, swelling of the face, lips, or tongue  blurred vision  breathing problems  changes in vision  low blood counts - This drug may decrease the number of white blood cells, red blood cells and platelets. You may be at increased risk for infections and bleeding.  nausea and vomiting  pain, redness or irritation at site where injected  pain, tingling, numbness in the hands or feet  redness, blistering, peeling, or loosening of the skin, including inside the mouth  signs of decreased platelets or bleeding - bruising, pinpoint red spots on the skin, black, tarry stools, nosebleeds  signs of decreased red blood cells - unusually weak or tired, fainting spells, lightheadedness  signs of infection - fever or chills, cough, sore throat, pain or difficulty passing urine  swelling of the ankle, feet, hands Side effects that usually do not require medical attention (report to your doctor or health care professional if they continue or are bothersome):  constipation  diarrhea  fingernail or toenail changes  hair loss  loss of appetite  mouth sores  muscle pain This list may not  describe all possible side effects. Call your doctor for medical advice about side effects. You may report side effects to FDA at 1-800-FDA-1088. Where should I keep my medicine? This drug is given in a hospital or clinic and will not be stored at home. NOTE: This sheet is a summary. It may not cover all possible information. If you have questions about this medicine, talk to your doctor, pharmacist, or health care provider.  2021 Elsevier/Gold Standard (2019-09-22 19:50:31)      Cyclophosphamide Injection What is  this medicine? CYCLOPHOSPHAMIDE (sye kloe FOSS fa mide) is a chemotherapy drug. It slows the growth of cancer cells. This medicine is used to treat many types of cancer like lymphoma, myeloma, leukemia, breast cancer, and ovarian cancer, to name a few. This medicine may be used for other purposes; ask your health care provider or pharmacist if you have questions. COMMON BRAND NAME(S): Cytoxan, Neosar What should I tell my health care provider before I take this medicine? They need to know if you have any of these conditions:  heart disease  history of irregular heartbeat  infection  kidney disease  liver disease  low blood counts, like white cells, platelets, or red blood cells  on hemodialysis  recent or ongoing radiation therapy  scarring or thickening of the lungs  trouble passing urine  an unusual or allergic reaction to cyclophosphamide, other medicines, foods, dyes, or preservatives  pregnant or trying to get pregnant  breast-feeding How should I use this medicine? This drug is usually given as an injection into a vein or muscle or by infusion into a vein. It is administered in a hospital or clinic by a specially trained health care professional. Talk to your pediatrician regarding the use of this medicine in children. Special care may be needed. Overdosage: If you think you have taken too much of this medicine contact a poison control center or emergency room at once. NOTE: This medicine is only for you. Do not share this medicine with others. What if I miss a dose? It is important not to miss your dose. Call your doctor or health care professional if you are unable to keep an appointment. What may interact with this medicine?  amphotericin B  azathioprine  certain antivirals for HIV or hepatitis  certain medicines for blood pressure, heart disease, irregular heart beat  certain medicines that treat or prevent blood clots like warfarin  certain other  medicines for cancer  cyclosporine  etanercept  indomethacin  medicines that relax muscles for surgery  medicines to increase blood counts  metronidazole This list may not describe all possible interactions. Give your health care provider a list of all the medicines, herbs, non-prescription drugs, or dietary supplements you use. Also tell them if you smoke, drink alcohol, or use illegal drugs. Some items may interact with your medicine. What should I watch for while using this medicine? Your condition will be monitored carefully while you are receiving this medicine. You may need blood work done while you are taking this medicine. Drink water or other fluids as directed. Urinate often, even at night. Some products may contain alcohol. Ask your health care professional if this medicine contains alcohol. Be sure to tell all health care professionals you are taking this medicine. Certain medicines, like metronidazole and disulfiram, can cause an unpleasant reaction when taken with alcohol. The reaction includes flushing, headache, nausea, vomiting, sweating, and increased thirst. The reaction can last from 30 minutes to several hours. Do not become pregnant  while taking this medicine or for 1 year after stopping it. Women should inform their health care professional if they wish to become pregnant or think they might be pregnant. Men should not father a child while taking this medicine and for 4 months after stopping it. There is potential for serious side effects to an unborn child. Talk to your health care professional for more information. Do not breast-feed an infant while taking this medicine or for 1 week after stopping it. This medicine has caused ovarian failure in some women. This medicine may make it more difficult to get pregnant. Talk to your health care professional if you are concerned about your fertility. This medicine has caused decreased sperm counts in some men. This may make it  more difficult to father a child. Talk to your health care professional if you are concerned about your fertility. Call your health care professional for advice if you get a fever, chills, or sore throat, or other symptoms of a cold or flu. Do not treat yourself. This medicine decreases your body's ability to fight infections. Try to avoid being around people who are sick. Avoid taking medicines that contain aspirin, acetaminophen, ibuprofen, naproxen, or ketoprofen unless instructed by your health care professional. These medicines may hide a fever. Talk to your health care professional about your risk of cancer. You may be more at risk for certain types of cancer if you take this medicine. If you are going to need surgery or other procedure, tell your health care professional that you are using this medicine. Be careful brushing or flossing your teeth or using a toothpick because you may get an infection or bleed more easily. If you have any dental work done, tell your dentist you are receiving this medicine. What side effects may I notice from receiving this medicine? Side effects that you should report to your doctor or health care professional as soon as possible:  allergic reactions like skin rash, itching or hives, swelling of the face, lips, or tongue  breathing problems  nausea, vomiting  signs and symptoms of bleeding such as bloody or black, tarry stools; red or dark brown urine; spitting up blood or brown material that looks like coffee grounds; red spots on the skin; unusual bruising or bleeding from the eyes, gums, or nose  signs and symptoms of heart failure like fast, irregular heartbeat, sudden weight gain; swelling of the ankles, feet, hands  signs and symptoms of infection like fever; chills; cough; sore throat; pain or trouble passing urine  signs and symptoms of kidney injury like trouble passing urine or change in the amount of urine  signs and symptoms of liver injury  like dark yellow or brown urine; general ill feeling or flu-like symptoms; light-colored stools; loss of appetite; nausea; right upper belly pain; unusually weak or tired; yellowing of the eyes or skin Side effects that usually do not require medical attention (report to your doctor or health care professional if they continue or are bothersome):  confusion  decreased hearing  diarrhea  facial flushing  hair loss  headache  loss of appetite  missed menstrual periods  signs and symptoms of low red blood cells or anemia such as unusually weak or tired; feeling faint or lightheaded; falls  skin discoloration This list may not describe all possible side effects. Call your doctor for medical advice about side effects. You may report side effects to FDA at 1-800-FDA-1088. Where should I keep my medicine? This drug is given in  a hospital or clinic and will not be stored at home. NOTE: This sheet is a summary. It may not cover all possible information. If you have questions about this medicine, talk to your doctor, pharmacist, or health care provider.  2021 Elsevier/Gold Standard (2019-07-28 09:53:29)

## 2020-12-15 ENCOUNTER — Ambulatory Visit: Payer: 59

## 2020-12-15 ENCOUNTER — Other Ambulatory Visit: Payer: 59

## 2020-12-15 ENCOUNTER — Telehealth: Payer: Self-pay | Admitting: Oncology

## 2020-12-15 ENCOUNTER — Telehealth: Payer: Self-pay | Admitting: *Deleted

## 2020-12-15 ENCOUNTER — Ambulatory Visit: Payer: 59 | Admitting: Hematology

## 2020-12-15 NOTE — Telephone Encounter (Signed)
No 2/8 los. No changes made to pt's schedule.

## 2020-12-15 NOTE — Telephone Encounter (Signed)
Left message on both home & mobile numbers to call back to let us know how she did with her treatment yest.

## 2020-12-15 NOTE — Telephone Encounter (Signed)
-----   Message from Rennis Harding, RN sent at 12/14/2020  4:56 PM EST ----- Regarding: Dr. Jana Hakim 1st time Chemo First time T/C   - Pt tolerated well

## 2020-12-16 ENCOUNTER — Inpatient Hospital Stay: Payer: 59

## 2020-12-16 ENCOUNTER — Other Ambulatory Visit: Payer: Self-pay

## 2020-12-16 ENCOUNTER — Ambulatory Visit: Payer: 59

## 2020-12-16 VITALS — BP 134/79 | HR 86 | Temp 98.0°F | Resp 16

## 2020-12-16 DIAGNOSIS — C50411 Malignant neoplasm of upper-outer quadrant of right female breast: Secondary | ICD-10-CM

## 2020-12-16 DIAGNOSIS — Z5111 Encounter for antineoplastic chemotherapy: Secondary | ICD-10-CM | POA: Diagnosis not present

## 2020-12-16 DIAGNOSIS — Z170 Estrogen receptor positive status [ER+]: Secondary | ICD-10-CM

## 2020-12-16 MED ORDER — PEGFILGRASTIM-BMEZ 6 MG/0.6ML ~~LOC~~ SOSY
6.0000 mg | PREFILLED_SYRINGE | Freq: Once | SUBCUTANEOUS | Status: AC
  Administered 2020-12-16: 6 mg via SUBCUTANEOUS

## 2020-12-16 MED ORDER — PEGFILGRASTIM-BMEZ 6 MG/0.6ML ~~LOC~~ SOSY
PREFILLED_SYRINGE | SUBCUTANEOUS | Status: AC
  Filled 2020-12-16: qty 0.6

## 2020-12-16 NOTE — Patient Instructions (Signed)
Pegfilgrastim injection What is this medicine? PEGFILGRASTIM (PEG fil gra stim) is a long-acting granulocyte colony-stimulating factor that stimulates the growth of neutrophils, a type of white blood cell important in the body's fight against infection. It is used to reduce the incidence of fever and infection in patients with certain types of cancer who are receiving chemotherapy that affects the bone marrow, and to increase survival after being exposed to high doses of radiation. This medicine may be used for other purposes; ask your health care provider or pharmacist if you have questions. COMMON BRAND NAME(S): Rexene Edison, Ziextenzo What should I tell my health care provider before I take this medicine? They need to know if you have any of these conditions:  kidney disease  latex allergy  ongoing radiation therapy  sickle cell disease  skin reactions to acrylic adhesives (On-Body Injector only)  an unusual or allergic reaction to pegfilgrastim, filgrastim, other medicines, foods, dyes, or preservatives  pregnant or trying to get pregnant  breast-feeding How should I use this medicine? This medicine is for injection under the skin. If you get this medicine at home, you will be taught how to prepare and give the pre-filled syringe or how to use the On-body Injector. Refer to the patient Instructions for Use for detailed instructions. Use exactly as directed. Tell your healthcare provider immediately if you suspect that the On-body Injector may not have performed as intended or if you suspect the use of the On-body Injector resulted in a missed or partial dose. It is important that you put your used needles and syringes in a special sharps container. Do not put them in a trash can. If you do not have a sharps container, call your pharmacist or healthcare provider to get one. Talk to your pediatrician regarding the use of this medicine in children. While this drug  may be prescribed for selected conditions, precautions do apply. Overdosage: If you think you have taken too much of this medicine contact a poison control center or emergency room at once. NOTE: This medicine is only for you. Do not share this medicine with others. What if I miss a dose? It is important not to miss your dose. Call your doctor or health care professional if you miss your dose. If you miss a dose due to an On-body Injector failure or leakage, a new dose should be administered as soon as possible using a single prefilled syringe for manual use. What may interact with this medicine? Interactions have not been studied. This list may not describe all possible interactions. Give your health care provider a list of all the medicines, herbs, non-prescription drugs, or dietary supplements you use. Also tell them if you smoke, drink alcohol, or use illegal drugs. Some items may interact with your medicine. What should I watch for while using this medicine? Your condition will be monitored carefully while you are receiving this medicine. You may need blood work done while you are taking this medicine. Talk to your health care provider about your risk of cancer. You may be more at risk for certain types of cancer if you take this medicine. If you are going to need a MRI, CT scan, or other procedure, tell your doctor that you are using this medicine (On-Body Injector only). What side effects may I notice from receiving this medicine? Side effects that you should report to your doctor or health care professional as soon as possible:  allergic reactions (skin rash, itching or hives, swelling of  the face, lips, or tongue)  back pain  dizziness  fever  pain, redness, or irritation at site where injected  pinpoint red spots on the skin  red or dark-brown urine  shortness of breath or breathing problems  stomach or side pain, or pain at the shoulder  swelling  tiredness  trouble  passing urine or change in the amount of urine  unusual bruising or bleeding Side effects that usually do not require medical attention (report to your doctor or health care professional if they continue or are bothersome):  bone pain  muscle pain This list may not describe all possible side effects. Call your doctor for medical advice about side effects. You may report side effects to FDA at 1-800-FDA-1088. Where should I keep my medicine? Keep out of the reach of children. If you are using this medicine at home, you will be instructed on how to store it. Throw away any unused medicine after the expiration date on the label. NOTE: This sheet is a summary. It may not cover all possible information. If you have questions about this medicine, talk to your doctor, pharmacist, or health care provider.  2021 Elsevier/Gold Standard (2019-11-14 13:20:51)

## 2020-12-20 ENCOUNTER — Telehealth: Payer: Self-pay

## 2020-12-20 ENCOUNTER — Other Ambulatory Visit: Payer: Self-pay | Admitting: Oncology

## 2020-12-20 NOTE — Telephone Encounter (Signed)
Returned call to pt. Pt states she is not feeling well. First chemo tx was on 12/14/20. Pt having mid and low back pain and chest "spasms". Pt states she has not taken anything for pain, she wanted to discuss this with MD first. Pt states she has had some neuropathy in her feet and that her legs feel weak. Pt states she had an MI in the past and that this pain is not similar to that. Will inform Dr Jana Hakim.  Per Dr Jana Hakim: Verified pt took clartin post tx as directed. Encouraged pt to take Tylenol or other pain medication and educated pt on expected side effects after each treatment. Pt verbalized understanding.

## 2020-12-21 ENCOUNTER — Other Ambulatory Visit: Payer: Self-pay

## 2020-12-21 ENCOUNTER — Encounter: Payer: Self-pay | Admitting: *Deleted

## 2020-12-21 ENCOUNTER — Inpatient Hospital Stay (HOSPITAL_BASED_OUTPATIENT_CLINIC_OR_DEPARTMENT_OTHER): Payer: 59 | Admitting: Oncology

## 2020-12-21 VITALS — BP 138/70 | HR 103 | Temp 97.9°F | Resp 18 | Ht 63.0 in | Wt 181.4 lb

## 2020-12-21 DIAGNOSIS — C50411 Malignant neoplasm of upper-outer quadrant of right female breast: Secondary | ICD-10-CM | POA: Diagnosis not present

## 2020-12-21 DIAGNOSIS — Z5111 Encounter for antineoplastic chemotherapy: Secondary | ICD-10-CM | POA: Diagnosis not present

## 2020-12-21 DIAGNOSIS — Z170 Estrogen receptor positive status [ER+]: Secondary | ICD-10-CM

## 2020-12-21 LAB — CBC WITH DIFFERENTIAL (CANCER CENTER ONLY)
Abs Immature Granulocytes: 4.23 10*3/uL — ABNORMAL HIGH (ref 0.00–0.07)
Basophils Absolute: 0.1 10*3/uL (ref 0.0–0.1)
Basophils Relative: 0 %
Eosinophils Absolute: 0.1 10*3/uL (ref 0.0–0.5)
Eosinophils Relative: 1 %
HCT: 37.5 % (ref 36.0–46.0)
Hemoglobin: 12.3 g/dL (ref 12.0–15.0)
Immature Granulocytes: 17 %
Lymphocytes Relative: 14 %
Lymphs Abs: 3.4 10*3/uL (ref 0.7–4.0)
MCH: 29.7 pg (ref 26.0–34.0)
MCHC: 32.8 g/dL (ref 30.0–36.0)
MCV: 90.6 fL (ref 80.0–100.0)
Monocytes Absolute: 5.4 10*3/uL — ABNORMAL HIGH (ref 0.1–1.0)
Monocytes Relative: 22 %
Neutro Abs: 11.6 10*3/uL — ABNORMAL HIGH (ref 1.7–7.7)
Neutrophils Relative %: 46 %
Platelet Count: 321 10*3/uL (ref 150–400)
RBC: 4.14 MIL/uL (ref 3.87–5.11)
RDW: 14.5 % (ref 11.5–15.5)
WBC Count: 24.9 10*3/uL — ABNORMAL HIGH (ref 4.0–10.5)
nRBC: 0.6 % — ABNORMAL HIGH (ref 0.0–0.2)

## 2020-12-21 NOTE — Progress Notes (Signed)
[facility]  [phone] [phone]     ID: Cassandra Cox DOB: 07-17-1961  MR#: [id number]  OZY#:[id number]  Patient Care Team: Pcp, No as PCP - General End, Harrell Gave, MD as PCP - Cardiology (Cardiology) Mauro Kaufmann, RN as Oncology Nurse Navigator Rockwell Germany, RN as Oncology Nurse Navigator Jovita Kussmaul, MD as Consulting Physician (General Surgery) Truitt Merle, MD as Consulting Physician (Hematology) Kyung Rudd, MD as Consulting Physician (Radiation Oncology) Chauncey Cruel, MD OTHER MD:  CHIEF COMPLAINT: estrogen receptor positive breast cancer  CURRENT TREATMENT: adjuvant chemotherapy    INTERVAL HISTORY: Cassandra Cox returns today for follow up and treatment of her estrogen receptor positive breast cancer accompanied by her granddaughter [name].  She began adjuvant chemotherapy, consisting of docetaxel and cyclophosphamide every 21 days x4, at her last visit on 12/14/2020.   REVIEW OF SYSTEMS: Cassandra Cox had quite a few side effects from the chemotherapy.  One side effect she did not have was nausea.  She felt weak and tired and her blood pressure went down a little bit to about 116/73.  She has significant brain fog.  After receiving the immune booster shot she had significant pain in her back to the buttocks and mid back.  She also had really bad heartburn like she was having a heart attack in the middle of her chest (epigastrium).  She had severe watery diarrhea for several days and lost about 7 pounds.  However she has had no diarrhea today or yesterday.  Her taste is already perverted.  She had some sinus headache and pressure and epistaxis, which was mild.  She has some soreness in her mouth but not mouth sores.  She did use the Digna cap in hopes to not lose her hair.  She tolerated it well.  For the bone pain she is hydrocodone and that did not constipate her.   COVID 19 VACCINATION STATUS: Refuses vaccination   HISTORY OF CURRENT  ILLNESS: From my original evaluation note:  Cassandra Cox had routine screening mammography on 06/30/2020 showing a possible abnormality in the right breast. She underwent right diagnostic mammography with tomography and right breast ultrasonography at [facility] on 07/20/2020 showing: breast density category C; 1.1 cm irregular mass in right breast at 10-11 o'clock, better seen on mammogram; no suspicious right axillary lymph nodes.  Accordingly on 08/09/2020 she proceeded to biopsy of the right breast area in question. The pathology from this procedure  showed: invasive and in situ mammary carcinoma, e-cadherin positive, grade 3. Prognostic indicators significant for: estrogen receptor, 95% positive and progesterone receptor, 90% positive. Proliferation marker Ki67 at 30%. HER2 negative by immunohistochemistry.  She underwent right lumpectomy on 10/04/2020 under Dr. Marlou Starks. Pathology from the procedure [id number]) showed: invasive ductal carcinoma, grade 2, 1.2 cm; ductal carcinoma in situ, intermediate grade; calcifications associated with carcinoma; margins uninvolved; lymphovascular space invasion present.  Out of three biopsied lymph nodes, one revealed metastatic carcinoma (1/3). The positive lymph node showed a disrupted capsule that is suspicious for extracapsular extension.  Mammaprint was performed on the final surgical sample and revealed high risk.  The patient's subsequent history is as detailed below.   PAST MEDICAL HISTORY: Past Medical History:  Diagnosis Date  . Anxiety   . Complication of anesthesia   . Coronary artery disease    a. 11/2016 Cath/PCI: LM nl, LAD 30ost, 33m(3.0x18 Resolute Integrity DES--3.75), LCX nl, RCA nl; b. MV 11/18: negative for ischemia, EF 72%, low risk  .  Depression   . Diabetes mellitus without complication ([place])    type 2  . Diastolic dysfunction    a. 11/2016 Echo: EF 55-60%, Gr2 DD, LAE, nl RV size/fxn.  . Family history of breast cancer   .  History of kidney stones   . Hyperlipemia \[id number]\  . Hypothyroidism   . Kidney stone 08/2016  . Mitral valve prolapse   . MVP (mitral valve prolapse)   . Neuromuscular disorder ([place])    bilateral feet  . PONV (postoperative nausea and vomiting)   . STEMI (ST elevation myocardial infarction) ([place]) 12/02/2016  . Vitamin B 12 deficiency     PAST SURGICAL HISTORY: Past Surgical History:  Procedure Laterality Date  . ABDOMINAL HYSTERECTOMY  2003   TAH due to adenomyosis  . BREAST LUMPECTOMY WITH RADIOACTIVE SEED AND SENTINEL LYMPH NODE BIOPSY Right 10/04/2020   Procedure: RIGHT BREAST REDUCTION LUMPECTOMY WITH RADIOACTIVE SEED AND SENTINEL LYMPH NODE BIOPSY;  Surgeon: Jovita Kussmaul, MD;  Location: [facility];  Service: General;  Laterality: Right;  . BREAST REDUCTION SURGERY  1990  . BREAST REDUCTION SURGERY Bilateral 10/04/2020   Procedure: BILATERAL MAMMARY REDUCTION  (BREAST);  Surgeon: Wallace Going, DO;  Location: [facility];  Service: Plastics;  Laterality: Bilateral;  . CARDIAC CATHETERIZATION N/A 12/02/2016   Procedure: Coronary Stent Intervention;  Surgeon: Nelva Bush, MD;  Location: [facility];  Service: Cardiovascular;  Laterality: N/A;  . CARDIAC CATHETERIZATION N/A 12/02/2016   Procedure: Left Heart Cath and Coronary Angiography;  Surgeon: Nelva Bush, MD;  Location: [facility];  Service: Cardiovascular;  Laterality: N/A;  . CARDIAC CATHETERIZATION N/A 12/02/2016   Procedure: Intravascular Ultrasound/IVUS;  Surgeon: Nelva Bush, MD;  Location: [facility];  Service: Cardiovascular;  Laterality: N/A;  . CORONARY STENT INTERVENTION    . MOUTH SURGERY     Dental implant  . PORTACATH PLACEMENT Left 12/13/2020   Procedure: INSERTION PORT-A-CATH LEFT SUBCLAVIAN;  Surgeon: Jovita Kussmaul, MD;  Location: [facility];  Service: General;  Laterality: Left;    FAMILY HISTORY: Family History   Problem Relation Age of Onset  . Heart disease Mother   . Diabetes Mother   . Stroke Mother   . Heart attack Mother   . Atrial fibrillation Mother   . Hypertension Mother   . Hyperlipidemia Mother   . COPD Father   . Hypertension Father   . Hyperlipidemia Father   . Breast cancer Maternal Aunt 22       x2 age 87  . Diabetes Maternal Grandfather    Her father died at age 80 and her mother at age 3. Cassandra Cox is an only child. She reports breast cancer in a maternal aunt two separate times, in her 52's and then in her 85's.   GYNECOLOGIC HISTORY:  No LMP recorded. Patient has had a hysterectomy. Menarche: 61 years old Age at first live birth: 60 years old [place] P 2 LMP 07/1997 (having periods at time of hysterectomy) Contraceptive: used oral contraceptives for 17 years without complications ([id number]) HRT used progesterone for 2-3 years, stopped with cancer diagnosis Hysterectomy? Yes, 1998 for adenomyosis BSO? 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 She is divorced. She lives by herself with he dachshund. Daughter Carmell Austria is 68, lives in [place], and is a homemaker; she suffers from [employer]. Daughter [place] is 31, lives in [place] and works as a Psychologist, educational.  The patient has 1 grandchild.  She is a Psychologist, forensic.    ADVANCED DIRECTIVES:    HEALTH MAINTENANCE: Social History   Tobacco Use  . Smoking status: Never Smoker  . Smokeless tobacco: Never Used  Vaping Use  . Vaping Use: Never used  Substance Use Topics  . Alcohol use: No  . Drug use: No     Colonoscopy:   PAP:   Bone density:    Allergies  Allergen Reactions  . Prednisone Other (See Comments)    REACTION: Swelling REACTION: Swelling Facial swelling REACTION: Swelling  . Sulfa Antibiotics Hives and Other (See Comments)    REACTION: Hives  . Sulfasalazine     REACTION: Hives  . Sulfonamide Derivatives     REACTION: Hives    Current Outpatient  Medications  Medication Sig Dispense Refill  . ALPRAZolam (XANAX) 0.25 MG tablet Take 0.25 mg by mouth 3 (three) times daily as needed for anxiety.    Francia Greaves THYROID 60 MG tablet Take 60 mg by mouth daily.    Marland Kitchen aspirin EC 81 MG tablet Take 81 mg by mouth daily.    Marland Kitchen atorvastatin (LIPITOR) 80 MG tablet TAKE 1 TABLET BY MOUTH DAILY AT 6PM GENERIC EQUIVALENT FOR LIPITOR (Patient taking differently: Take 80 mg by mouth daily.) 90 tablet 3  . buPROPion (WELLBUTRIN XL) 150 MG 24 hr tablet Take 300 mg by mouth daily.     . Cholecalciferol (VITAMIN D-3) 5000 UNITS TABS Take 1 tablet by mouth daily.    . clopidogrel (PLAVIX) 75 MG tablet TAKE 1 TABLET BY MOUTH DAILY GENERIC EQUIVALENT FOR PLAVIX 90 tablet 3  . Coenzyme Q10 (COQ10) 100 MG CAPS Take 100 mg by mouth daily.    . Cyanocobalamin 1000 MCG/ML KIT Inject 1,000 mcg as directed once a week.    Marland Kitchen dexamethasone (DECADRON) 4 MG tablet Take 1 tablet (4 mg total) by mouth 2 (two) times daily. Start the day before Taxotere. Then 1 tab daily the day after chemo for 2 days. 30 tablet 1  . ezetimibe (ZETIA) 10 MG tablet TAKE 1 TABLET BY MOUTH DAILY. (Patient taking differently: Take 10 mg by mouth daily.) 90 tablet 3  . FLUoxetine (PROZAC) 20 MG capsule Take 20 mg by mouth daily.     Marland Kitchen HYDROcodone-acetaminophen (NORCO/VICODIN) 5-325 MG tablet Take 1-2 tablets by mouth every 6 (six) hours as needed for moderate pain or severe pain. 10 tablet 0  . ketoconazole (NIZORAL) 2 % cream Apply 1 application topically daily. 15 g 0  . levocetirizine (XYZAL) 5 MG tablet Take 1 tablet at bedtime by mouth.  2  . lidocaine-prilocaine (EMLA) cream Apply to affected area once 30 g 3  . liothyronine (CYTOMEL) 5 MCG tablet Take 5 mcg by mouth every morning.     . magnesium gluconate (MAGONATE) 500 MG tablet Take 1,000 mg by mouth at bedtime.    . metFORMIN (GLUCOPHAGE-XR) 500 MG 24 hr tablet Take 500 mg by mouth 2 (two) times daily.    . montelukast (SINGULAIR) 10 MG tablet  Take 10 mg by mouth every morning.    Marland Kitchen NALTREXONE HCL PO Take 4 mg by mouth at bedtime.     . nitroGLYCERIN (NITROSTAT) 0.4 MG SL tablet Place 1 tablet (0.4 mg total) under the tongue every 5 (five) minutes as needed for chest pain. For maximum of 3 doses. 25 tablet 3  . ondansetron (ZOFRAN) 8 MG tablet Take 1 tablet (8 mg total) by mouth 2 (two) times  daily as needed for refractory nausea / vomiting. Start on day 3 after chemo. 30 tablet 1  . prochlorperazine (COMPAZINE) 10 MG tablet Take 1 tablet (10 mg total) by mouth every 6 (six) hours as needed (Nausea or vomiting). 30 tablet 1  . vitamin C (ASCORBIC ACID) 500 MG tablet Take 500 mg by mouth daily.    Marland Kitchen zinc gluconate 50 MG tablet Take 50 mg by mouth daily.    Marland Kitchen zolpidem (AMBIEN CR) 12.5 MG CR tablet Take 12.5 mg by mouth at bedtime as needed for sleep.     No current facility-administered medications for this visit.    OBJECTIVE: White woman who appears stated age  9:   12/21/20 1546  BP: 138/70  Pulse: (!) 103  Resp: 18  Temp: 97.9 F (36.6 C)  SpO2: 98%     Body mass index is 32.13 kg/m.   Wt Readings from Last 3 Encounters:  12/21/20 181 lb 6.4 oz (82.3 kg)  12/14/20 186 lb (84.4 kg)  12/13/20 184 lb 15.5 oz (83.9 kg)      ECOG FS:1 - Symptomatic but completely ambulatory  Sclerae unicteric, EOMs intact Wearing a mask No cervical or supraclavicular adenopathy Lungs no rales or rhonchi Heart regular rate and rhythm Abd soft, nontender, positive bowel sounds MSK no focal spinal tenderness, no upper extremity lymphedema Neuro: nonfocal, well oriented, appropriate affect Breasts: The right breast is status post lumpectomy.  There is no evidence of dehiscence erythema or swelling.  The left breast and both axillae are benign.   LAB RESULTS:  CMP     Component Value Date/Time   NA 138 12/14/2020 1020   NA 140 03/03/2020 1052   K 4.1 12/14/2020 1020   CL 107 12/14/2020 1020   CO2 23 12/14/2020 1020    GLUCOSE 153 (H) 12/14/2020 1020   BUN 16 12/14/2020 1020   BUN 18 03/03/2020 1052   CREATININE 0.85 12/14/2020 1020   CALCIUM 9.1 12/14/2020 1020   PROT 6.9 12/14/2020 1020   PROT 6.5 03/03/2020 1052   ALBUMIN 3.9 12/14/2020 1020   ALBUMIN 4.4 03/03/2020 1052   AST 14 (L) 12/14/2020 1020   ALT 25 12/14/2020 1020   ALKPHOS 83 12/14/2020 1020   BILITOT 0.3 12/14/2020 1020   GFRNONAA >60 12/14/2020 1020   GFRAA 111 03/03/2020 1052    No results found for: TOTALPROTELP, ALBUMINELP, A1GS, A2GS, BETS, BETA2SER, GAMS, MSPIKE, SPEI  Lab Results  Component Value Date   WBC 24.9 (H) 12/21/2020   NEUTROABS 11.6 (H) 12/21/2020   HGB 12.3 12/21/2020   HCT 37.5 12/21/2020   MCV 90.6 12/21/2020   PLT 321 12/21/2020    No results found for: LABCA2  No components found for: TJQZES923  No results for input(s): INR in the last 168 hours.  No results found for: LABCA2  No results found for: RAQ762  No results found for: UQJ335  No results found for: KTG256  No results found for: CA2729  No components found for: HGQUANT  No results found for: CEA1 / No results found for: CEA1   No results found for: AFPTUMOR  No results found for: CHROMOGRNA  No results found for: KPAFRELGTCHN, LAMBDASER, KAPLAMBRATIO (kappa/lambda light chains)  No results found for: HGBA, HGBA2QUANT, HGBFQUANT, HGBSQUAN (Hemoglobinopathy evaluation)   No results found for: LDH  No results found for: IRON, TIBC, IRONPCTSAT (Iron and TIBC)  No results found for: FERRITIN  Urinalysis    Component Value Date/Time   COLORURINE RED (  A) 08/28/2016 1428   APPEARANCEUR CLOUDY (A) 08/28/2016 1428   LABSPEC 1.017 08/28/2016 1428   PHURINE 7.0 08/28/2016 1428   GLUCOSEU NEGATIVE 08/28/2016 1428   HGBUR LARGE (A) 08/28/2016 1428   BILIRUBINUR NEG 09/25/2017 1443   KETONESUR 15 (A) 08/28/2016 1428   PROTEINUR NEG 09/25/2017 1443   PROTEINUR NEGATIVE 08/28/2016 1428   UROBILINOGEN negative 06/12/2016 1610    NITRITE NEG 09/25/2017 1443   NITRITE NEGATIVE 08/28/2016 1428   LEUKOCYTESUR Negative 09/25/2017 1443    STUDIES: DG Chest Port 1 View  Result Date: 12/13/2020 CLINICAL DATA:  Status post Port-A-Cath placement. EXAM: PORTABLE CHEST 1 VIEW COMPARISON:  September 14, 2017. FINDINGS: Mild cardiomegaly is noted. No pneumothorax or pleural effusion is noted. Interval placement of left subclavian catheter with distal tip in expected position of the SVC. Lungs are clear. Bony thorax is unremarkable. IMPRESSION: Interval placement of left subclavian catheter with distal tip in expected position of the SVC. No pneumothorax is noted. Electronically Signed   By: Marijo Conception M.D.   On: 12/13/2020 14:22   DG Fluoro Guide CV Line-No Report  Result Date: 12/13/2020 Fluoroscopy was utilized by the requesting physician.  No radiographic interpretation.     ELIGIBLE FOR AVAILABLE RESEARCH PROTOCOL: AET  ASSESSMENT: 60 y.o. [place] woman status post right lumpectomy and sentinel lymph node sampling 10/04/2020 for a pT1c pN1, stage IB   invasive ductal carcinoma, grade 2, estrogen and progesterone receptor positive, HER2 not amplified, with an MIB-1 of 30%  (a) a total of 3 right axillary lymph nodes removed, one positive with concern regarding extracapsular extension  (1) MammaPrint high risk predicts a significant benefit from chemotherapy  (2) adjuvant chemotherapy will consist of cyclophosphamide and docetaxel every 21 days x 4 starting 12/14/2020  (a) patient using DigniCap for hair preservation  (3) adjuvant radiation to follow.  (4) antiestrogens to start at the completion of local treatment   PLAN:  Rosealynn had multiple side effects from her first chemotherapy cycle.  I am hoping subsequent cycles will be better however.  Usually the bony discomfort from the PEG fill gastrium is milder with subsequent treatments.  I am not sure why she had such severe diarrhea.  I am glad it resolved.  We  certainly can use Imodium or Questran or both as needed for that.  It is best to not blow her nose while taking taxanes.  For that problem saline sprays can be helpful.  She is doing a great job of hydrating herself and I commended her for this  Unfortunately she had not been scheduled for labs so I do not have her lab work at the time of this dictation.  If her white cell count is low we will start her on prophylactic antibiotics.  Otherwise she will return in 2 weeks for her second cycle and I will see her with each subsequent cycle until she is done with her chemotherapy.  Total encounter time 35 minutes.*  Addendum: White cell count today shows 24.9 with 11.6 neutrophils.  She will not need major antibiotics.   Virgie Dad. Kamillah Didonato, MD 12/21/2020 5:33 PM Medical Oncology and Hematology [facility] [place], [place] [address] Tel. [phone]    Fax. [phone]   This document serves as a record of services personally performed by Lurline Del, MD. It was created on his behalf by Wilburn Mylar, a trained medical scribe. The creation of this record is based on the scribe's personal observations  and the provider's statements to them.   I, Lurline Del MD, have reviewed the above documentation for accuracy and completeness, and I agree with the above.   *Total Encounter Time as defined by the Centers for Medicare and Medicaid Services includes, in addition to the face-to-face time of a patient visit (documented in the note above) non-face-to-face time: obtaining and reviewing outside history, ordering and reviewing medications, tests or procedures, care coordination (communications with other health care professionals or caregivers) and documentation in the medical record.

## 2020-12-22 ENCOUNTER — Telehealth: Payer: Self-pay | Admitting: Oncology

## 2020-12-22 ENCOUNTER — Telehealth: Payer: Self-pay

## 2020-12-22 ENCOUNTER — Telehealth: Payer: Self-pay | Admitting: Internal Medicine

## 2020-12-22 MED ORDER — ATORVASTATIN CALCIUM 80 MG PO TABS: ORAL_TABLET | ORAL | 0 refills | Status: DC

## 2020-12-22 MED ORDER — EZETIMIBE 10 MG PO TABS: 10.0000 mg | ORAL_TABLET | Freq: Every day | ORAL | 0 refills | Status: DC

## 2020-12-22 NOTE — Telephone Encounter (Signed)
Requested Prescriptions   Signed Prescriptions Disp Refills   ezetimibe (ZETIA) 10 MG tablet 90 tablet 0    Sig: Take 1 tablet (10 mg total) by mouth daily.    Authorizing Provider: END, CHRISTOPHER    Ordering User: NEWCOMER MCCLAIN, Maricela Kawahara L   atorvastatin (LIPITOR) 80 MG tablet 90 tablet 0    Sig: TAKE 1 TABLET BY MOUTH DAILY AT 6PM GENERIC EQUIVALENT FOR LIPITOR    Authorizing Provider: END, CHRISTOPHER    Ordering User: NEWCOMER MCCLAIN, Raniyah Curenton L

## 2020-12-22 NOTE — Telephone Encounter (Signed)
RN left voicemail for patient to call back regarding lab work.

## 2020-12-22 NOTE — Telephone Encounter (Signed)
Scheduled appts per 2/15 los. Pt to get updated appt calendar at next visit per appt notes.

## 2020-12-22 NOTE — Telephone Encounter (Signed)
*  STAT* If patient is at the pharmacy, call can be transferred to refill team.   1. Which medications need to be refilled? (please list name of each medication and dose if known)    Atorvastatin 80 mg po q d  Ezetimibe 10 mg po q d    2. Which pharmacy/location (including street and city if local pharmacy) is medication to be sent to?   [facility] [address]  3. Do they need a 30 day or 90 day supply? [place]

## 2020-12-23 ENCOUNTER — Telehealth: Payer: Self-pay

## 2020-12-23 NOTE — Telephone Encounter (Signed)
Per Dr. Jana Hakim pt contacted to let her know that pt's labs look great and she will not need nader antibiotics. Pt verbalized understanding.

## 2020-12-28 ENCOUNTER — Telehealth: Payer: Self-pay | Admitting: Oncology

## 2020-12-28 NOTE — Telephone Encounter (Signed)
Release: [id number] Faxed medical records to the [facility] @ fax# [phone]

## 2020-12-31 ENCOUNTER — Other Ambulatory Visit: Payer: Self-pay | Admitting: *Deleted

## 2020-12-31 DIAGNOSIS — Z170 Estrogen receptor positive status [ER+]: Secondary | ICD-10-CM

## 2020-12-31 DIAGNOSIS — C50411 Malignant neoplasm of upper-outer quadrant of right female breast: Secondary | ICD-10-CM

## 2021-01-03 NOTE — Progress Notes (Addendum)
[place]  [phone] [phone]     ID: Cassandra Cox DOB: [birth year]  MR#: [id number]  MMH#:[id number]  Patient Care Team: Pcp, No as PCP - General End, Harrell Gave, MD as PCP - Cardiology (Cardiology) Mauro Kaufmann, RN as Oncology Nurse Navigator Rockwell Germany, RN as Oncology Nurse Navigator Jovita Kussmaul, MD as Consulting Physician (General Surgery) Truitt Merle, MD as Consulting Physician (Hematology) Kyung Rudd, MD as Consulting Physician (Radiation Oncology) Chauncey Cruel, MD OTHER MD:  CHIEF COMPLAINT: estrogen receptor positive breast cancer  CURRENT TREATMENT: adjuvant chemotherapy    INTERVAL HISTORY: Cassandra Cox returns today for follow up and treatment of her estrogen receptor positive breast cancer accompanied by her granddaughter Cassandra Cox.  She began adjuvant chemotherapy, consisting of docetaxel and cyclophosphamide every 21 days x4, on 12/14/2020.   REVIEW OF SYSTEMS: Cassandra Cox did remarkably well with her first cycle.  The only real symptom she had was constipation.  She usually has a normal bowel movement daily.  She had none after 3 days and gave herself a little mag citrate which took care of the problem.  She did develop one blister on her lower lip, nothing on the inside of the mouth.  She continues to exercise regularly.  Her port did not draw today and she is currently waiting on dwelling before her labs can be drawn   Cassandra Cox: Refuses vaccination   HISTORY OF CURRENT ILLNESS: From my original evaluation note:  Cassandra Cox had routine screening mammography on 06/30/2020 showing a possible abnormality in the right breast. She underwent right diagnostic mammography with tomography and right breast ultrasonography at [facility] on 07/20/2020 showing: breast density category C; 1.1 cm irregular mass in right breast at 10-11 o'clock, better seen on mammogram; no suspicious right axillary lymph  nodes.  Accordingly on 08/09/2020 she proceeded to biopsy of the right breast area in question. The pathology from this procedure  showed: invasive and in situ mammary carcinoma, e-cadherin positive, grade 3. Prognostic indicators significant for: estrogen receptor, 95% positive and progesterone receptor, 90% positive. Proliferation marker Ki67 at 30%. HER2 negative by immunohistochemistry.  She underwent right lumpectomy on 10/04/2020 under Dr. Marlou Starks. Pathology from the procedure [id number]) showed: invasive ductal carcinoma, grade 2, 1.2 cm; ductal carcinoma in situ, intermediate grade; calcifications associated with carcinoma; margins uninvolved; lymphovascular space invasion present.  Out of three biopsied lymph nodes, one revealed metastatic carcinoma (1/3). The positive lymph node showed a disrupted capsule that is suspicious for extracapsular extension.  Mammaprint was performed on the final surgical sample and revealed high risk.  The patient's subsequent history is as detailed below.   PAST MEDICAL HISTORY: Past Medical History:  Diagnosis Date  . Anxiety   . Complication of anesthesia   . Coronary artery disease    a. 11/2016 Cath/PCI: LM nl, LAD 30ost, 78m(3.0x18 Resolute Integrity DES--3.75), LCX nl, RCA nl; b. MV 11/18: negative for ischemia, EF 72%, low risk  . Depression   . Diabetes mellitus without complication ([facility]    type 2  . Diastolic dysfunction    a. 11/2016 Echo: EF 55-60%, Gr2 DD, LAE, nl RV size/fxn.  . Family history of breast cancer   . History of kidney stones   . Hyperlipemia \\[id number]\ . Hypothyroidism   . Kidney stone 08/2016  . Mitral valve prolapse   . MVP (mitral valve prolapse)   . Neuromuscular disorder ([place]    bilateral feet  . PONV (postoperative nausea  and vomiting)   . STEMI (ST elevation myocardial infarction) ([place]) 12/02/2016  . Vitamin B 12 deficiency     PAST SURGICAL HISTORY: Past Surgical History:  Procedure Laterality Date   . ABDOMINAL HYSTERECTOMY  2003   TAH due to adenomyosis  . BREAST LUMPECTOMY WITH RADIOACTIVE SEED AND SENTINEL LYMPH NODE BIOPSY Right 10/04/2020   Procedure: RIGHT BREAST REDUCTION LUMPECTOMY WITH RADIOACTIVE SEED AND SENTINEL LYMPH NODE BIOPSY;  Surgeon: Jovita Kussmaul, MD;  Location: [facility];  Service: General;  Laterality: Right;  . BREAST REDUCTION SURGERY  1990  . BREAST REDUCTION SURGERY Bilateral 10/04/2020   Procedure: BILATERAL MAMMARY REDUCTION  (BREAST);  Surgeon: Wallace Going, DO;  Location: [facility];  Service: Plastics;  Laterality: Bilateral;  . CARDIAC CATHETERIZATION N/A 12/02/2016   Procedure: Coronary Stent Intervention;  Surgeon: Nelva Bush, MD;  Location: [facility];  Service: Cardiovascular;  Laterality: N/A;  . CARDIAC CATHETERIZATION N/A 12/02/2016   Procedure: Left Heart Cath and Coronary Angiography;  Surgeon: Nelva Bush, MD;  Location: [place] [facility];  Service: Cardiovascular;  Laterality: N/A;  . CARDIAC CATHETERIZATION N/A 12/02/2016   Procedure: Intravascular Ultrasound/IVUS;  Surgeon: Nelva Bush, MD;  Location: [facility] [place];  Service: Cardiovascular;  Laterality: N/A;  . CORONARY STENT INTERVENTION    . MOUTH SURGERY     Dental implant  . PORTACATH PLACEMENT Left 12/13/2020   Procedure: INSERTION PORT-A-CATH LEFT SUBCLAVIAN;  Surgeon: Jovita Kussmaul, MD;  Location: [facility];  Service: General;  Laterality: Left;    FAMILY HISTORY: Family History  Problem Relation Age of Onset  . Heart disease Mother   . Diabetes Mother   . Stroke Mother   . Heart attack Mother   . Atrial fibrillation Mother   . Hypertension Mother   . Hyperlipidemia Mother   . COPD Father   . Hypertension Father   . Hyperlipidemia Father   . Breast cancer Maternal Aunt 53       x2 age 12  . Diabetes Maternal Grandfather    Her father died at age 21 and her mother at age 50. Cassandra Cox is an  only child. She reports breast cancer in a maternal aunt two separate times, in her 78's and then in her 21's.   GYNECOLOGIC HISTORY:  No LMP recorded. Patient has had a hysterectomy. Menarche: 60 years old Age at first live birth: 60 years old [place] P 2 LMP 07/1997 (having periods at time of hysterectomy) Contraceptive: used oral contraceptives for 17 years without complications ([id number]) HRT used progesterone for 2-3 years, stopped with cancer diagnosis Hysterectomy? Yes, 1998 for adenomyosis BSO? 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 She lives by herself with he dachshund. Daughter Cassandra Cox is 69, lives in [place], and is a homemaker; she suffers from [employer]. Daughter Cassandra Cox is 37, lives in [place] and works as a Psychologist, educational.  The patient has 1 grandchild.  She is a Psychologist, forensic.    ADVANCED DIRECTIVES:    HEALTH MAINTENANCE: Social History   Tobacco Use  . Smoking status: Never Smoker  . Smokeless tobacco: Never Used  Vaping Use  . Vaping Use: Never used  Substance Use Topics  . Alcohol use: No  . Drug use: No     Colonoscopy:   PAP:   Bone density:    Allergies  Allergen Reactions  . Prednisone Other (See Comments)  REACTION: Swelling REACTION: Swelling Facial swelling REACTION: Swelling  . Sulfa Antibiotics Hives and Other (See Comments)    REACTION: Hives  . Sulfasalazine     REACTION: Hives  . Sulfonamide Derivatives     REACTION: Hives    Current Outpatient Medications  Medication Sig Dispense Refill  . valACYclovir (VALTREX) 500 MG tablet Take 1 tablet (500 mg total) by mouth daily. 90 tablet 1  . ALPRAZolam (XANAX) 0.25 MG tablet Take 0.25 mg by mouth 3 (three) times daily as needed for anxiety.    Francia Greaves THYROID 60 MG tablet Take 60 mg by mouth daily.    Marland Kitchen aspirin EC 81 MG tablet Take 81 mg by mouth daily.    Marland Kitchen atorvastatin (LIPITOR) 80 MG tablet TAKE 1 TABLET BY MOUTH  DAILY AT 6PM GENERIC EQUIVALENT FOR LIPITOR 90 tablet 0  . buPROPion (WELLBUTRIN XL) 150 MG 24 hr tablet Take 300 mg by mouth daily.     . Cholecalciferol (VITAMIN D-3) 5000 UNITS TABS Take 1 tablet by mouth daily.    . clopidogrel (PLAVIX) 75 MG tablet TAKE 1 TABLET BY MOUTH DAILY GENERIC EQUIVALENT FOR PLAVIX 90 tablet 3  . Coenzyme Q10 (COQ10) 100 MG CAPS Take 100 mg by mouth daily.    . Cyanocobalamin 1000 MCG/ML KIT Inject 1,000 mcg as directed once a week.    Marland Kitchen dexamethasone (DECADRON) 4 MG tablet Take 1 tablet (4 mg total) by mouth 2 (two) times daily. Start the day before Taxotere. Then 1 tab daily the day after chemo for 2 days. 30 tablet 1  . ezetimibe (ZETIA) 10 MG tablet Take 1 tablet (10 mg total) by mouth daily. 90 tablet 0  . FLUoxetine (PROZAC) 20 MG capsule Take 20 mg by mouth daily.     Marland Kitchen HYDROcodone-acetaminophen (NORCO/VICODIN) 5-325 MG tablet Take 1-2 tablets by mouth every 6 (six) hours as needed for moderate pain or severe pain. 10 tablet 0  . ketoconazole (NIZORAL) 2 % cream Apply 1 application topically daily. 15 g 0  . levocetirizine (XYZAL) 5 MG tablet Take 1 tablet at bedtime by mouth.  2  . lidocaine-prilocaine (EMLA) cream Apply to affected area once 30 g 3  . liothyronine (CYTOMEL) 5 MCG tablet Take 5 mcg by mouth every morning.     . magnesium gluconate (MAGONATE) 500 MG tablet Take 1,000 mg by mouth at bedtime.    . metFORMIN (GLUCOPHAGE-XR) 500 MG 24 hr tablet Take 500 mg by mouth 2 (two) times daily.    . montelukast (SINGULAIR) 10 MG tablet Take 10 mg by mouth every morning.    Marland Kitchen NALTREXONE HCL PO Take 4 mg by mouth at bedtime.     . nitroGLYCERIN (NITROSTAT) 0.4 MG SL tablet Place 1 tablet (0.4 mg total) under the tongue every 5 (five) minutes as needed for chest pain. For maximum of 3 doses. 25 tablet 3  . ondansetron (ZOFRAN) 8 MG tablet Take 1 tablet (8 mg total) by mouth 2 (two) times daily as needed for refractory nausea / vomiting. Start on day 3 after  chemo. 30 tablet 1  . prochlorperazine (COMPAZINE) 10 MG tablet Take 1 tablet (10 mg total) by mouth every 6 (six) hours as needed (Nausea or vomiting). 30 tablet 1  . vitamin C (ASCORBIC ACID) 500 MG tablet Take 500 mg by mouth daily.    Marland Kitchen zinc gluconate 50 MG tablet Take 50 mg by mouth daily.    Marland Kitchen zolpidem (AMBIEN CR) 12.5 MG CR tablet  Take 12.5 mg by mouth at bedtime as needed for sleep.     No current facility-administered medications for this visit.    OBJECTIVE: White woman who appears stated age  There were no vitals filed for this visit.   There is no height or weight on file to calculate BMI.   Wt Readings from Last 3 Encounters:  12/21/20 181 lb 6.4 oz (82.3 kg)  12/14/20 186 lb (84.4 kg)  12/13/20 184 lb 15.5 oz (83.9 kg)      ECOG FS:1 - Symptomatic but completely ambulatory  Sclerae unicteric, EOMs intact Wearing a mask No cervical or supraclavicular adenopathy Lungs no rales or rhonchi Heart regular rate and rhythm Abd soft, nontender, positive bowel sounds MSK no focal spinal tenderness, no upper extremity lymphedema Neuro: nonfocal, well oriented, appropriate affect Breasts: The right breast is status post lumpectomy.  The left breast and both axillae are benign   LAB RESULTS:  CMP     Component Value Date/Time   NA 138 12/14/2020 1020   NA 140 03/03/2020 1052   K 4.1 12/14/2020 1020   CL 107 12/14/2020 1020   CO2 23 12/14/2020 1020   GLUCOSE 153 (H) 12/14/2020 1020   BUN 16 12/14/2020 1020   BUN 18 03/03/2020 1052   CREATININE 0.85 12/14/2020 1020   CALCIUM 9.1 12/14/2020 1020   PROT 6.9 12/14/2020 1020   PROT 6.5 03/03/2020 1052   ALBUMIN 3.9 12/14/2020 1020   ALBUMIN 4.4 03/03/2020 1052   AST 14 (L) 12/14/2020 1020   ALT 25 12/14/2020 1020   ALKPHOS 83 12/14/2020 1020   BILITOT 0.3 12/14/2020 1020   GFRNONAA >60 12/14/2020 1020   GFRAA 111 03/03/2020 1052    No results found for: TOTALPROTELP, ALBUMINELP, A1GS, A2GS, BETS, BETA2SER, GAMS,  MSPIKE, SPEI  Lab Results  Component Value Date   WBC 24.9 (H) 12/21/2020   NEUTROABS 11.6 (H) 12/21/2020   HGB 12.3 12/21/2020   HCT 37.5 12/21/2020   MCV 90.6 12/21/2020   PLT 321 12/21/2020    No results found for: LABCA2  No components found for: [id number]  No results for input(s): INR in the last 168 hours.  No results found for: LABCA2  No results found for: NFA213  No results found for: YQM578  No results found for: ION629  No results found for: CA2729  No components found for: HGQUANT  No results found for: CEA1 / No results found for: CEA1   No results found for: AFPTUMOR  No results found for: CHROMOGRNA  No results found for: KPAFRELGTCHN, LAMBDASER, KAPLAMBRATIO (kappa/lambda light chains)  No results found for: HGBA, HGBA2QUANT, HGBFQUANT, HGBSQUAN (Hemoglobinopathy evaluation)   No results found for: LDH  No results found for: IRON, TIBC, IRONPCTSAT (Iron and TIBC)  No results found for: FERRITIN  Urinalysis    Component Value Date/Time   COLORURINE RED (A) 08/28/2016 1428   APPEARANCEUR CLOUDY (A) 08/28/2016 1428   LABSPEC 1.017 08/28/2016 1428   PHURINE 7.0 08/28/2016 1428   GLUCOSEU NEGATIVE 08/28/2016 1428   HGBUR LARGE (A) 08/28/2016 1428   BILIRUBINUR NEG 09/25/2017 1443   KETONESUR 15 (A) 08/28/2016 1428   PROTEINUR NEG 09/25/2017 1443   PROTEINUR NEGATIVE 08/28/2016 1428   UROBILINOGEN negative 06/12/2016 1610   NITRITE NEG 09/25/2017 1443   NITRITE NEGATIVE 08/28/2016 1428   LEUKOCYTESUR Negative 09/25/2017 1443    STUDIES: DG Chest Port 1 View  Result Date: 12/13/2020 CLINICAL DATA:  Status post Port-A-Cath placement. EXAM: PORTABLE CHEST 1  VIEW COMPARISON:  September 14, 2017. FINDINGS: Mild cardiomegaly is noted. No pneumothorax or pleural effusion is noted. Interval placement of left subclavian catheter with distal tip in expected position of the SVC. Lungs are clear. Bony thorax is unremarkable. IMPRESSION: Interval  placement of left subclavian catheter with distal tip in expected position of the SVC. No pneumothorax is noted. Electronically Signed   By: Marijo Conception M.D.   On: 12/13/2020 14:22   DG Fluoro Guide CV Line-No Report  Result Date: 12/13/2020 Fluoroscopy was utilized by the requesting physician.  No radiographic interpretation.     ELIGIBLE FOR AVAILABLE RESEARCH PROTOCOL: AET  ASSESSMENT: 60 y.o. [place] woman status post right lumpectomy and sentinel lymph node sampling 10/04/2020 for a pT1c pN1, stage IB   invasive ductal carcinoma, grade 2, estrogen and progesterone receptor positive, HER2 not amplified, with an MIB-1 of 30%  (a) a total of 3 right axillary lymph nodes removed, one positive with concern regarding extracapsular extension  (1) MammaPrint high risk predicts a significant benefit from chemotherapy  (2) adjuvant chemotherapy will consist of cyclophosphamide and docetaxel every 21 days x 4 starting 12/14/2020  (a) patient using DigniCap for hair preservation  (3) adjuvant radiation to follow.  (4) antiestrogens to start at the completion of local treatment   PLAN:  Saniya was to have been seen 01/04/2021 in the treatment area but this does not happen.  She will be seen on 01/06/2021 when she returns for fluids     Sarajane Jews C. Jamillah Camilo, MD 01/04/2021 11:15 AM Medical Oncology and Hematology [facility] [place], [place] [address] Tel. [phone]    Fax. [phone]   This document serves as a record of services personally performed by Lurline Del, MD. It was created on his behalf by Wilburn Mylar, a trained medical scribe. The creation of this record is based on the scribe's personal observations and the provider's statements to them.   I, Lurline Del MD, have reviewed the above documentation for accuracy and completeness, and I agree with the above.   *Total Encounter Time as defined by the Centers for Medicare and Medicaid  Services includes, in addition to the face-to-face time of a patient visit (documented in the note above) non-face-to-face time: obtaining and reviewing outside history, ordering and reviewing medications, tests or procedures, care coordination (communications with other health care professionals or caregivers) and documentation in the medical record.

## 2021-01-04 ENCOUNTER — Other Ambulatory Visit: Payer: 59

## 2021-01-04 ENCOUNTER — Inpatient Hospital Stay (HOSPITAL_BASED_OUTPATIENT_CLINIC_OR_DEPARTMENT_OTHER): Payer: 59 | Admitting: Oncology

## 2021-01-04 ENCOUNTER — Encounter: Payer: Self-pay | Admitting: *Deleted

## 2021-01-04 ENCOUNTER — Other Ambulatory Visit: Payer: Self-pay

## 2021-01-04 ENCOUNTER — Inpatient Hospital Stay: Payer: 59

## 2021-01-04 ENCOUNTER — Inpatient Hospital Stay: Payer: 59 | Attending: Genetic Counselor

## 2021-01-04 ENCOUNTER — Ambulatory Visit: Payer: 59

## 2021-01-04 VITALS — BP 128/72 | HR 88 | Temp 98.0°F | Resp 18

## 2021-01-04 DIAGNOSIS — Z95828 Presence of other vascular implants and grafts: Secondary | ICD-10-CM

## 2021-01-04 DIAGNOSIS — Z170 Estrogen receptor positive status [ER+]: Secondary | ICD-10-CM

## 2021-01-04 DIAGNOSIS — C50411 Malignant neoplasm of upper-outer quadrant of right female breast: Secondary | ICD-10-CM

## 2021-01-04 DIAGNOSIS — Z5111 Encounter for antineoplastic chemotherapy: Secondary | ICD-10-CM | POA: Insufficient documentation

## 2021-01-04 DIAGNOSIS — Z5189 Encounter for other specified aftercare: Secondary | ICD-10-CM | POA: Diagnosis not present

## 2021-01-04 LAB — CBC WITH DIFFERENTIAL (CANCER CENTER ONLY)
Abs Immature Granulocytes: 0.06 10*3/uL (ref 0.00–0.07)
Basophils Absolute: 0 10*3/uL (ref 0.0–0.1)
Basophils Relative: 0 %
Eosinophils Absolute: 0 10*3/uL (ref 0.0–0.5)
Eosinophils Relative: 0 %
HCT: 37.1 % (ref 36.0–46.0)
Hemoglobin: 11.8 g/dL — ABNORMAL LOW (ref 12.0–15.0)
Immature Granulocytes: 0 %
Lymphocytes Relative: 5 %
Lymphs Abs: 0.8 10*3/uL (ref 0.7–4.0)
MCH: 29.4 pg (ref 26.0–34.0)
MCHC: 31.8 g/dL (ref 30.0–36.0)
MCV: 92.5 fL (ref 80.0–100.0)
Monocytes Absolute: 0.4 10*3/uL (ref 0.1–1.0)
Monocytes Relative: 3 %
Neutro Abs: 12.7 10*3/uL — ABNORMAL HIGH (ref 1.7–7.7)
Neutrophils Relative %: 92 %
Platelet Count: 444 10*3/uL — ABNORMAL HIGH (ref 150–400)
RBC: 4.01 MIL/uL (ref 3.87–5.11)
RDW: 16.1 % — ABNORMAL HIGH (ref 11.5–15.5)
WBC Count: 13.9 10*3/uL — ABNORMAL HIGH (ref 4.0–10.5)
nRBC: 0 % (ref 0.0–0.2)

## 2021-01-04 LAB — CMP (CANCER CENTER ONLY)
ALT: 33 U/L (ref 0–44)
AST: 16 U/L (ref 15–41)
Albumin: 3.9 g/dL (ref 3.5–5.0)
Alkaline Phosphatase: 92 U/L (ref 38–126)
Anion gap: 10 (ref 5–15)
BUN: 25 mg/dL — ABNORMAL HIGH (ref 8–23)
CO2: 20 mmol/L — ABNORMAL LOW (ref 22–32)
Calcium: 9.4 mg/dL (ref 8.9–10.3)
Chloride: 106 mmol/L (ref 98–111)
Creatinine: 0.76 mg/dL (ref 0.44–1.00)
GFR, Estimated: >60 mL/min (ref >60)
Glucose, Bld: 169 mg/dL — ABNORMAL HIGH (ref 70–99)
Potassium: 3.9 mmol/L (ref 3.5–5.1)
Sodium: 136 mmol/L (ref 135–145)
Total Bilirubin: 0.3 mg/dL (ref 0.3–1.2)
Total Protein: 7.2 g/dL (ref 6.5–8.1)

## 2021-01-04 LAB — GENETIC SCREENING ORDER

## 2021-01-04 MED ORDER — SODIUM CHLORIDE 0.9 % IV SOLN
10.0000 mg | Freq: Once | INTRAVENOUS | Status: AC
  Administered 2021-01-04: 10 mg via INTRAVENOUS
  Filled 2021-01-04: qty 10

## 2021-01-04 MED ORDER — PALONOSETRON HCL INJECTION 0.25 MG/5ML
0.2500 mg | Freq: Once | INTRAVENOUS | Status: AC
  Administered 2021-01-04: 0.25 mg via INTRAVENOUS

## 2021-01-04 MED ORDER — SODIUM CHLORIDE 0.9% FLUSH
10.0000 mL | INTRAVENOUS | Status: DC | PRN
  Administered 2021-01-04: 10 mL
  Filled 2021-01-04: qty 10

## 2021-01-04 MED ORDER — HEPARIN SOD (PORK) LOCK FLUSH 100 UNIT/ML IV SOLN
500.0000 [IU] | Freq: Once | INTRAVENOUS | Status: AC | PRN
  Administered 2021-01-04: 500 [IU]
  Filled 2021-01-04: qty 5

## 2021-01-04 MED ORDER — PALONOSETRON HCL INJECTION 0.25 MG/5ML
INTRAVENOUS | Status: AC
  Filled 2021-01-04: qty 5

## 2021-01-04 MED ORDER — SODIUM CHLORIDE 0.9 % IV SOLN
Freq: Once | INTRAVENOUS | Status: AC
  Filled 2021-01-04: qty 250

## 2021-01-04 MED ORDER — SODIUM CHLORIDE 0.9 % IV SOLN
75.0000 mg/m2 | Freq: Once | INTRAVENOUS | Status: AC
  Administered 2021-01-04: 140 mg via INTRAVENOUS
  Filled 2021-01-04: qty 14

## 2021-01-04 MED ORDER — SODIUM CHLORIDE 0.9% FLUSH
10.0000 mL | Freq: Once | INTRAVENOUS | Status: AC
  Administered 2021-01-04: 10 mL
  Filled 2021-01-04: qty 10

## 2021-01-04 MED ORDER — SODIUM CHLORIDE 0.9 % IV SOLN
600.0000 mg/m2 | Freq: Once | INTRAVENOUS | Status: AC
  Administered 2021-01-04: 1160 mg via INTRAVENOUS
  Filled 2021-01-04: qty 58

## 2021-01-04 MED ORDER — VALACYCLOVIR HCL 500 MG PO TABS: 500.0000 mg | ORAL_TABLET | Freq: Every day | ORAL | 1 refills | Status: DC

## 2021-01-05 ENCOUNTER — Telehealth: Payer: Self-pay | Admitting: Oncology

## 2021-01-05 ENCOUNTER — Other Ambulatory Visit: Payer: Self-pay | Admitting: Oncology

## 2021-01-05 NOTE — Telephone Encounter (Signed)
Called pt per 3/2 sch msg - no answer. Left message for pt with appt date and time

## 2021-01-05 NOTE — Progress Notes (Signed)
[place]  Telephone:(336) [phone] Fax:(336) [phone]     ID: Cassandra Cox DOB: 04-16-1961  MR#: [id number]  KXF#:[id number]  Patient Care Team: Pcp, No as PCP - General End, Harrell Gave, MD as PCP - Cardiology (Cardiology) Mauro Kaufmann, RN as Oncology Nurse Navigator Rockwell Germany, RN as Oncology Nurse Navigator Jovita Kussmaul, MD as Consulting Physician (General Surgery) Truitt Merle, MD as Consulting Physician (Hematology) Kyung Rudd, MD as Consulting Physician (Radiation Oncology) Aurea Graff OTHER MD:  CHIEF COMPLAINT: estrogen receptor positive breast cancer  CURRENT TREATMENT: adjuvant chemotherapy    INTERVAL HISTORY: [name] returns today for follow up and treatment of her estrogen receptor positive breast cancer accompanied by her granddaughter [name].  She began adjuvant chemotherapy, consisting of docetaxel and cyclophosphamide every 21 days x4, on 12/14/2020.  She is currently day 3 cycle 2.   REVIEW OF SYSTEMS: Danesha tells me she did generally well with the second cycle.  She did not have problems with mouth fourscore significant nausea or any vomiting.  She has a feeling in the lower throat like food might get stuck there, but it actually does not get stuck.  She is drinking and eating normally.  She denies severe reflux symptoms.  She has not had a significant change in bowel or bladder habits.  She has had no peripheral neuropathy symptoms.  A detailed review of systems was otherwise stable.   COVID 19 VACCINATION STATUS: Refuses vaccination   HISTORY OF CURRENT ILLNESS: From my original evaluation note:  Cassandra Cox had routine screening mammography on 06/30/2020 showing a possible abnormality in the right breast. She underwent right diagnostic mammography with tomography and right breast ultrasonography at [facility] on 07/20/2020 showing: breast density category C; 1.1 cm irregular mass in right breast at 10-11 o'clock, better seen on  mammogram; no suspicious right axillary lymph nodes.  Accordingly on 08/09/2020 she proceeded to biopsy of the right breast area in question. The pathology from this procedure  showed: invasive and in situ mammary carcinoma, e-cadherin positive, grade 3. Prognostic indicators significant for: estrogen receptor, 95% positive and progesterone receptor, 90% positive. Proliferation marker Ki67 at 30%. HER2 negative by immunohistochemistry.  She underwent right lumpectomy on 10/04/2020 under Dr. Marlou Starks. Pathology from the procedure [id number]) showed: invasive ductal carcinoma, grade 2, 1.2 cm; ductal carcinoma in situ, intermediate grade; calcifications associated with carcinoma; margins uninvolved; lymphovascular space invasion present.  Out of three biopsied lymph nodes, one revealed metastatic carcinoma (1/3). The positive lymph node showed a disrupted capsule that is suspicious for extracapsular extension.  Mammaprint was performed on the final surgical sample and revealed high risk.  The patient's subsequent history is as detailed below.   PAST MEDICAL HISTORY: Past Medical History:  Diagnosis Date  . Anxiety   . Complication of anesthesia   . Coronary artery disease    a. 11/2016 Cath/PCI: LM nl, LAD 30ost, 64m(3.0x18 Resolute Integrity DES--3.75), LCX nl, RCA nl; b. MV 11/18: negative for ischemia, EF 72%, low risk  . Depression   . Diabetes mellitus without complication ([place]    type 2  . Diastolic dysfunction    a. 11/2016 Echo: EF 55-60%, Gr2 DD, LAE, nl RV size/fxn.  . Family history of breast cancer   . History of kidney stones   . Hyperlipemia \\[id number]\ . Hypothyroidism   . Kidney stone 08/2016  . Mitral valve prolapse   . MVP (mitral valve prolapse)   . Neuromuscular disorder ([place]  bilateral feet  . PONV (postoperative nausea and vomiting)   . STEMI (ST elevation myocardial infarction) ([place]) 12/02/2016  . Vitamin B 12 deficiency     PAST SURGICAL HISTORY: Past  Surgical History:  Procedure Laterality Date  . ABDOMINAL HYSTERECTOMY  2003   TAH due to adenomyosis  . BREAST LUMPECTOMY WITH RADIOACTIVE SEED AND SENTINEL LYMPH NODE BIOPSY Right 10/04/2020   Procedure: RIGHT BREAST REDUCTION LUMPECTOMY WITH RADIOACTIVE SEED AND SENTINEL LYMPH NODE BIOPSY;  Surgeon: Jovita Kussmaul, MD;  Location: [place];  Service: General;  Laterality: Right;  . BREAST REDUCTION SURGERY  1990  . BREAST REDUCTION SURGERY Bilateral 10/04/2020   Procedure: BILATERAL MAMMARY REDUCTION  (BREAST);  Surgeon: Wallace Going, DO;  Location: [place];  Service: Plastics;  Laterality: Bilateral;  . CARDIAC CATHETERIZATION N/A 12/02/2016   Procedure: Coronary Stent Intervention;  Surgeon: Nelva Bush, MD;  Location: [place] [facility];  Service: Cardiovascular;  Laterality: N/A;  . CARDIAC CATHETERIZATION N/A 12/02/2016   Procedure: Left Heart Cath and Coronary Angiography;  Surgeon: Nelva Bush, MD;  Location: [place] [facility];  Service: Cardiovascular;  Laterality: N/A;  . CARDIAC CATHETERIZATION N/A 12/02/2016   Procedure: Intravascular Ultrasound/IVUS;  Surgeon: Nelva Bush, MD;  Location: [place] [facility];  Service: Cardiovascular;  Laterality: N/A;  . CORONARY STENT INTERVENTION    . MOUTH SURGERY     Dental implant  . PORTACATH PLACEMENT Left 12/13/2020   Procedure: INSERTION PORT-A-CATH LEFT SUBCLAVIAN;  Surgeon: Jovita Kussmaul, MD;  Location: [place];  Service: General;  Laterality: Left;    FAMILY HISTORY: Family History  Problem Relation Age of Onset  . Heart disease Mother   . Diabetes Mother   . Stroke Mother   . Heart attack Mother   . Atrial fibrillation Mother   . Hypertension Mother   . Hyperlipidemia Mother   . COPD Father   . Hypertension Father   . Hyperlipidemia Father   . Breast cancer Maternal Aunt 81       x2 age 58  . Diabetes Maternal Grandfather    Her father died at  age 4 and her mother at age 30. Placida is an only child. She reports breast cancer in a maternal aunt two separate times, in her 43's and then in her 35's.   GYNECOLOGIC HISTORY:  No LMP recorded. Patient has had a hysterectomy. Menarche: 60 years old Age at first live birth: 60 years old Jenkins P 2 LMP 07/1997 (having periods at time of hysterectomy) Contraceptive: used oral contraceptives for 17 years without complications ([id number]) HRT used progesterone for 2-3 years, stopped with cancer diagnosis Hysterectomy? Yes, 1998 for adenomyosis BSO? 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 She lives by herself with he dachshund. Daughter Carmell [place] is 28, lives in [place], and is a homemaker; she suffers from [employer]. Daughter [place] is 77, lives in [place] and works as a Psychologist, educational.  The patient has 1 grandchild.  She is a Psychologist, forensic.    ADVANCED DIRECTIVES:    HEALTH MAINTENANCE: Social History   Tobacco Use  . Smoking status: Never Smoker  . Smokeless tobacco: Never Used  Vaping Use  . Vaping Use: Never used  Substance Use Topics  . Alcohol use: No  . Drug use: No     Colonoscopy:   PAP:   Bone density:    Allergies  Allergen Reactions  .  Prednisone Other (See Comments)    REACTION: Swelling REACTION: Swelling Facial swelling REACTION: Swelling  . Sulfa Antibiotics Hives and Other (See Comments)    REACTION: Hives  . Sulfasalazine     REACTION: Hives  . Sulfonamide Derivatives     REACTION: Hives    Current Outpatient Medications  Medication Sig Dispense Refill  . ALPRAZolam (XANAX) 0.25 MG tablet Take 0.25 mg by mouth 3 (three) times daily as needed for anxiety.    Francia Greaves THYROID 60 MG tablet Take 60 mg by mouth daily.    Marland Kitchen aspirin EC 81 MG tablet Take 81 mg by mouth daily.    Marland Kitchen atorvastatin (LIPITOR) 80 MG tablet TAKE 1 TABLET BY MOUTH DAILY AT 6PM GENERIC EQUIVALENT FOR LIPITOR 90 tablet 0   . buPROPion (WELLBUTRIN XL) 150 MG 24 hr tablet Take 300 mg by mouth daily.     . Cholecalciferol (VITAMIN D-3) 5000 UNITS TABS Take 1 tablet by mouth daily.    . clopidogrel (PLAVIX) 75 MG tablet TAKE 1 TABLET BY MOUTH DAILY GENERIC EQUIVALENT FOR PLAVIX 90 tablet 3  . Coenzyme Q10 (COQ10) 100 MG CAPS Take 100 mg by mouth daily.    . Cyanocobalamin 1000 MCG/ML KIT Inject 1,000 mcg as directed once a week.    Marland Kitchen dexamethasone (DECADRON) 4 MG tablet Take 1 tablet (4 mg total) by mouth 2 (two) times daily. Start the day before Taxotere. Then 1 tab daily the day after chemo for 2 days. 30 tablet 1  . ezetimibe (ZETIA) 10 MG tablet Take 1 tablet (10 mg total) by mouth daily. 90 tablet 0  . FLUoxetine (PROZAC) 20 MG capsule Take 20 mg by mouth daily.     Marland Kitchen HYDROcodone-acetaminophen (NORCO/VICODIN) 5-325 MG tablet Take 1-2 tablets by mouth every 6 (six) hours as needed for moderate pain or severe pain. 10 tablet 0  . ketoconazole (NIZORAL) 2 % cream Apply 1 application topically daily. 15 g 0  . levocetirizine (XYZAL) 5 MG tablet Take 1 tablet at bedtime by mouth.  2  . lidocaine-prilocaine (EMLA) cream Apply to affected area once 30 g 3  . liothyronine (CYTOMEL) 5 MCG tablet Take 5 mcg by mouth every morning.     . magnesium gluconate (MAGONATE) 500 MG tablet Take 1,000 mg by mouth at bedtime.    . metFORMIN (GLUCOPHAGE-XR) 500 MG 24 hr tablet Take 500 mg by mouth 2 (two) times daily.    . montelukast (SINGULAIR) 10 MG tablet Take 10 mg by mouth every morning.    Marland Kitchen NALTREXONE HCL PO Take 4 mg by mouth at bedtime.     . nitroGLYCERIN (NITROSTAT) 0.4 MG SL tablet Place 1 tablet (0.4 mg total) under the tongue every 5 (five) minutes as needed for chest pain. For maximum of 3 doses. 25 tablet 3  . ondansetron (ZOFRAN) 8 MG tablet Take 1 tablet (8 mg total) by mouth 2 (two) times daily as needed for refractory nausea / vomiting. Start on day 3 after chemo. 30 tablet 1  . prochlorperazine (COMPAZINE) 10 MG  tablet Take 1 tablet (10 mg total) by mouth every 6 (six) hours as needed (Nausea or vomiting). 30 tablet 1  . valACYclovir (VALTREX) 500 MG tablet Take 1 tablet (500 mg total) by mouth daily. 90 tablet 1  . vitamin C (ASCORBIC ACID) 500 MG tablet Take 500 mg by mouth daily.    Marland Kitchen zinc gluconate 50 MG tablet Take 50 mg by mouth daily.    Marland Kitchen  zolpidem (AMBIEN CR) 12.5 MG CR tablet Take 12.5 mg by mouth at bedtime as needed for sleep.     No current facility-administered medications for this visit.    OBJECTIVE: White woman examined in the infusion area  There were no vitals filed for this visit.   There is no height or weight on file to calculate BMI.   Wt Readings from Last 3 Encounters:  12/21/20 181 lb 6.4 oz (82.3 kg)  12/14/20 186 lb (84.4 kg)  12/13/20 184 lb 15.5 oz (83.9 kg)   Vitals of the 01/06/2021 visit please see the infusion area flowsheet    ECOG FS:1 - Symptomatic but completely ambulatory  Sclerae unicteric, EOMs intact Wearing a mask  Lungs no rales or rhonchi Heart regular rate and rhythm Abd soft, nontender, positive bowel sounds Neuro: nonfocal, well oriented, appropriate affect Breasts: Deferred   LAB RESULTS:  CMP     Component Value Date/Time   NA 136 01/04/2021 1145   NA 140 03/03/2020 1052   K 3.9 01/04/2021 1145   CL 106 01/04/2021 1145   CO2 20 (L) 01/04/2021 1145   GLUCOSE 169 (H) 01/04/2021 1145   BUN 25 (H) 01/04/2021 1145   BUN 18 03/03/2020 1052   CREATININE 0.76 01/04/2021 1145   CALCIUM 9.4 01/04/2021 1145   PROT 7.2 01/04/2021 1145   PROT 6.5 03/03/2020 1052   ALBUMIN 3.9 01/04/2021 1145   ALBUMIN 4.4 03/03/2020 1052   AST 16 01/04/2021 1145   ALT 33 01/04/2021 1145   ALKPHOS 92 01/04/2021 1145   BILITOT 0.3 01/04/2021 1145   GFRNONAA >60 01/04/2021 1145   GFRAA 111 03/03/2020 1052    No results found for: TOTALPROTELP, ALBUMINELP, A1GS, A2GS, BETS, BETA2SER, GAMS, MSPIKE, SPEI  Lab Results  Component Value Date   WBC 13.9  (H) 01/04/2021   NEUTROABS 12.7 (H) 01/04/2021   HGB 11.8 (L) 01/04/2021   HCT 37.1 01/04/2021   MCV 92.5 01/04/2021   PLT 444 (H) 01/04/2021    No results found for: LABCA2  No components found for: [id number]  No results for input(s): INR in the last 168 hours.  No results found for: LABCA2  No results found for: VXB939  No results found for: QZE092  No results found for: ZRA076  No results found for: CA2729  No components found for: HGQUANT  No results found for: CEA1 / No results found for: CEA1   No results found for: AFPTUMOR  No results found for: CHROMOGRNA  No results found for: KPAFRELGTCHN, LAMBDASER, KAPLAMBRATIO (kappa/lambda light chains)  No results found for: HGBA, HGBA2QUANT, HGBFQUANT, HGBSQUAN (Hemoglobinopathy evaluation)   No results found for: LDH  No results found for: IRON, TIBC, IRONPCTSAT (Iron and TIBC)  No results found for: FERRITIN  Urinalysis    Component Value Date/Time   COLORURINE RED (A) 08/28/2016 1428   APPEARANCEUR CLOUDY (A) 08/28/2016 1428   LABSPEC 1.017 08/28/2016 1428   PHURINE 7.0 08/28/2016 1428   GLUCOSEU NEGATIVE 08/28/2016 1428   HGBUR LARGE (A) 08/28/2016 1428   BILIRUBINUR NEG 09/25/2017 1443   KETONESUR 15 (A) 08/28/2016 1428   PROTEINUR NEG 09/25/2017 1443   PROTEINUR NEGATIVE 08/28/2016 1428   UROBILINOGEN negative 06/12/2016 1610   NITRITE NEG 09/25/2017 1443   NITRITE NEGATIVE 08/28/2016 1428   LEUKOCYTESUR Negative 09/25/2017 1443    STUDIES: DG Chest Port 1 View  Result Date: 12/13/2020 CLINICAL DATA:  Status post Port-A-Cath placement. EXAM: PORTABLE CHEST 1 VIEW COMPARISON:  September 14, 2017.  FINDINGS: Mild cardiomegaly is noted. No pneumothorax or pleural effusion is noted. Interval placement of left subclavian catheter with distal tip in expected position of the SVC. Lungs are clear. Bony thorax is unremarkable. IMPRESSION: Interval placement of left subclavian catheter with distal tip in  expected position of the SVC. No pneumothorax is noted. Electronically Signed   By: Marijo Conception M.D.   On: 12/13/2020 14:22   DG Fluoro Guide CV Line-No Report  Result Date: 12/13/2020 Fluoroscopy was utilized by the requesting physician.  No radiographic interpretation.     ELIGIBLE FOR AVAILABLE RESEARCH PROTOCOL: AET  ASSESSMENT: 60 y.o. [place] woman status post right lumpectomy and sentinel lymph node sampling 10/04/2020 for a pT1c pN1, stage IB   invasive ductal carcinoma, grade 2, estrogen and progesterone receptor positive, HER2 not amplified, with an MIB-1 of 30%  (a) a total of 3 right axillary lymph nodes removed, one positive with concern regarding extracapsular extension  (1) MammaPrint high risk predicts a significant benefit from chemotherapy  (2) adjuvant chemotherapy will consist of cyclophosphamide and docetaxel every 21 days x 4 starting 12/14/2020  (a) patient using DigniCap for hair preservation  (3) adjuvant radiation to follow.  (4) antiestrogens to start at the completion of local treatment   PLAN:  Chrystal continues to tolerate treatment quite well.  Note that the previous note, from 01/04/2021, was incorrect as was the chart and both those have been corrected.  The patient more specifically is not receiving weekly Taxol but is receiving every 3-week carboplatin and docetaxel.  She is tolerating treatment better than expected.  The Digna cap appears to be working for her.  I reassured her that what she is experiencing in terms of swallowing changes is likely due to her dexamethasone which can cause some esophagitis.  It should resolve without any other intervention but she will let us know if that is not the case.  Otherwise she will return to see [place] on 01/25/2021 for cycle 3 of her chemotherapy.  Total encounter time 20 minutes.Sarajane Jews C. Jasline Buskirk, MD 01/05/2021 3:18 PM Medical Oncology and Hematology [facility] [place], [place] [address] Tel. [phone]    Fax. [phone]   This document serves as a record of services personally performed by Lurline Del, MD. It was created on his behalf by Wilburn Mylar, a trained medical scribe. The creation of this record is based on the scribe's personal observations and the provider's statements to them.   I, Lurline Del MD, have reviewed the above documentation for accuracy and completeness, and I agree with the above.   *Total Encounter Time as defined by the Centers for Medicare and Medicaid Services includes, in addition to the face-to-face time of a patient visit (documented in the note above) non-face-to-face time: obtaining and reviewing outside history, ordering and reviewing medications, tests or procedures, care coordination (communications with other health care professionals or caregivers) and documentation in the medical record.

## 2021-01-06 ENCOUNTER — Inpatient Hospital Stay: Payer: 59

## 2021-01-06 ENCOUNTER — Inpatient Hospital Stay (HOSPITAL_BASED_OUTPATIENT_CLINIC_OR_DEPARTMENT_OTHER): Payer: 59 | Admitting: Oncology

## 2021-01-06 ENCOUNTER — Telehealth: Payer: Self-pay | Admitting: Oncology

## 2021-01-06 ENCOUNTER — Ambulatory Visit: Payer: 59

## 2021-01-06 ENCOUNTER — Other Ambulatory Visit: Payer: Self-pay

## 2021-01-06 VITALS — BP 123/71 | HR 88 | Temp 99.2°F | Resp 20

## 2021-01-06 DIAGNOSIS — Z95828 Presence of other vascular implants and grafts: Secondary | ICD-10-CM

## 2021-01-06 DIAGNOSIS — Z170 Estrogen receptor positive status [ER+]: Secondary | ICD-10-CM | POA: Diagnosis not present

## 2021-01-06 DIAGNOSIS — C50411 Malignant neoplasm of upper-outer quadrant of right female breast: Secondary | ICD-10-CM

## 2021-01-06 DIAGNOSIS — Z5111 Encounter for antineoplastic chemotherapy: Secondary | ICD-10-CM | POA: Diagnosis not present

## 2021-01-06 MED ORDER — SODIUM CHLORIDE 0.9 % IV SOLN
INTRAVENOUS | Status: DC
  Filled 2021-01-06 (×2): qty 250

## 2021-01-06 MED ORDER — HEPARIN SOD (PORK) LOCK FLUSH 100 UNIT/ML IV SOLN
500.0000 [IU] | Freq: Once | INTRAVENOUS | Status: AC
  Administered 2021-01-06: 500 [IU]
  Filled 2021-01-06: qty 5

## 2021-01-06 MED ORDER — PEGFILGRASTIM-BMEZ 6 MG/0.6ML ~~LOC~~ SOSY
6.0000 mg | PREFILLED_SYRINGE | Freq: Once | SUBCUTANEOUS | Status: AC
  Administered 2021-01-06: 6 mg via SUBCUTANEOUS

## 2021-01-06 MED ORDER — SODIUM CHLORIDE 0.9% FLUSH
10.0000 mL | Freq: Once | INTRAVENOUS | Status: AC
  Administered 2021-01-06: 10 mL
  Filled 2021-01-06: qty 10

## 2021-01-06 MED ORDER — PEGFILGRASTIM-BMEZ 6 MG/0.6ML ~~LOC~~ SOSY
PREFILLED_SYRINGE | SUBCUTANEOUS | Status: AC
  Filled 2021-01-06: qty 0.6

## 2021-01-06 NOTE — Patient Instructions (Signed)
Rehydration, Adult Rehydration is the replacement of body fluids, salts, and minerals (electrolytes) that are lost during dehydration. Dehydration is when there is not enough water or other fluids in the body. This happens when you lose more fluids than you take in. Common causes of dehydration include:  Not drinking enough fluids. This can occur when you are ill or doing activities that require a lot of energy, especially in hot weather.  Conditions that cause loss of water or other fluids, such as diarrhea, vomiting, sweating, or urinating a lot.  Other illnesses, such as fever or infection.  Certain medicines, such as those that remove excess fluid from the body (diuretics). Symptoms of mild or moderate dehydration may include thirst, dry lips and mouth, and dizziness. Symptoms of severe dehydration may include increased heart rate, confusion, fainting, and not urinating. For severe dehydration, you may need to get fluids through an IV at the hospital. For mild or moderate dehydration, you can usually rehydrate at home by drinking certain fluids as told by your health care provider. What are the risks? Generally, rehydration is safe. However, taking in too much fluid (overhydration) can be a problem. This is rare. Overhydration can cause an electrolyte imbalance, kidney failure, or a decrease in salt (sodium) levels in the body. Supplies needed You will need an oral rehydration solution (ORS) if your health care provider tells you to use one. This is a drink to treat dehydration. It can be found in pharmacies and retail stores. How to rehydrate Fluids Follow instructions from your health care provider for rehydration. The kind of fluid and the amount you should drink depend on your condition. In general, you should choose drinks that you prefer.  If told by your health care provider, drink an ORS. ? Make an ORS by following instructions on the package. ? Start by drinking small amounts,  about  cup (120 mL) every 5-10 minutes. ? Slowly increase how much you drink until you have taken the amount recommended by your health care provider.  Drink enough clear fluids to keep your urine pale yellow. If you were told to drink an ORS, finish it first, then start slowly drinking other clear fluids. Drink fluids such as: ? Water. This includes sparkling water and flavored water. Drinking only water can lead to having too little sodium in your body (hyponatremia). Follow the advice of your health care provider. ? Water from ice chips you suck on. ? Fruit juice with water you add to it (diluted). ? Sports drinks. ? Hot or cold herbal teas. ? Broth-based soups. ? Milk or milk products. Food Follow instructions from your health care provider about what to eat while you rehydrate. Your health care provider may recommend that you slowly begin eating regular foods in small amounts.  Eat foods that contain a healthy balance of electrolytes, such as bananas, oranges, potatoes, tomatoes, and spinach.  Avoid foods that are greasy or contain a lot of sugar. In some cases, you may get nutrition through a feeding tube that is passed through your nose and into your stomach (nasogastric tube, or NG tube). This may be done if you have uncontrolled vomiting or diarrhea.   Beverages to avoid Certain beverages may make dehydration worse. While you rehydrate, avoid drinking alcohol.   How to tell if you are recovering from dehydration You may be recovering from dehydration if:  You are urinating more often than before you started rehydrating.  Your urine is pale yellow.  Your energy level  improves.  You vomit less frequently.  You have diarrhea less frequently.  Your appetite improves or returns to normal.  You feel less dizzy or less light-headed.  Your skin tone and color start to look more normal. Follow these instructions at home:  Take over-the-counter and prescription medicines only  as told by your health care provider.  Do not take sodium tablets. Doing this can lead to having too much sodium in your body (hypernatremia). Contact a health care provider if:  You continue to have symptoms of mild or moderate dehydration, such as: ? Thirst. ? Dry lips. ? Slightly dry mouth. ? Dizziness. ? Dark urine or less urine than normal. ? Muscle cramps.  You continue to vomit or have diarrhea. Get help right away if you:  Have symptoms of dehydration that get worse.  Have a fever.  Have a severe headache.  Have been vomiting and the following happens: ? Your vomiting gets worse or does not go away. ? Your vomit includes blood or green matter (bile). ? You cannot eat or drink without vomiting.  Have problems with urination or bowel movements, such as: ? Diarrhea that gets worse or does not go away. ? Blood in your stool (feces). This may cause stool to look black and tarry. ? Not urinating, or urinating only a small amount of very dark urine, within 6-8 hours.  Have trouble breathing.  Have symptoms that get worse with treatment. These symptoms may represent a serious problem that is an emergency. Do not wait to see if the symptoms will go away. Get medical help right away. Call your local emergency services (911 in the [place] Do not drive yourself to the hospital. Summary  Rehydration is the replacement of body fluids and minerals (electrolytes) that are lost during dehydration.  Follow instructions from your health care provider for rehydration. The kind of fluid and amount you should drink depend on your condition.  Slowly increase how much you drink until you have taken the amount recommended by your health care provider.  Contact your health care provider if you continue to show signs of mild or moderate dehydration. This information is not intended to replace advice given to you by your health care provider. Make sure you discuss any questions you have with  your health care provider. Document Revised: 12/24/2019 Document Reviewed: 11/03/2019 Elsevier Patient Education  2021 [place].  Pegfilgrastim injection What is this medicine? PEGFILGRASTIM (PEG fil gra stim) is a long-acting granulocyte colony-stimulating factor that stimulates the growth of neutrophils, a type of white blood cell important in the body's fight against infection. It is used to reduce the incidence of fever and infection in patients with certain types of cancer who are receiving chemotherapy that affects the bone marrow, and to increase survival after being exposed to high doses of radiation. This medicine may be used for other purposes; ask your health care provider or pharmacist if you have questions. COMMON BRAND NAME(S): Rexene Edison, Ziextenzo What should I tell my health care provider before I take this medicine? They need to know if you have any of these conditions:  kidney disease  latex allergy  ongoing radiation therapy  sickle cell disease  skin reactions to acrylic adhesives (On-Body Injector only)  an unusual or allergic reaction to pegfilgrastim, filgrastim, other medicines, foods, dyes, or preservatives  pregnant or trying to get pregnant  breast-feeding How should I use this medicine? This medicine is for injection under the skin. If you  get this medicine at home, you will be taught how to prepare and give the pre-filled syringe or how to use the On-body Injector. Refer to the patient Instructions for Use for detailed instructions. Use exactly as directed. Tell your healthcare provider immediately if you suspect that the On-body Injector may not have performed as intended or if you suspect the use of the On-body Injector resulted in a missed or partial dose. It is important that you put your used needles and syringes in a special sharps container. Do not put them in a trash can. If you do not have a sharps container, call  your pharmacist or healthcare provider to get one. Talk to your pediatrician regarding the use of this medicine in children. While this drug may be prescribed for selected conditions, precautions do apply. Overdosage: If you think you have taken too much of this medicine contact a poison control center or emergency room at once. NOTE: This medicine is only for you. Do not share this medicine with others. What if I miss a dose? It is important not to miss your dose. Call your doctor or health care professional if you miss your dose. If you miss a dose due to an On-body Injector failure or leakage, a new dose should be administered as soon as possible using a single prefilled syringe for manual use. What may interact with this medicine? Interactions have not been studied. This list may not describe all possible interactions. Give your health care provider a list of all the medicines, herbs, non-prescription drugs, or dietary supplements you use. Also tell them if you smoke, drink alcohol, or use illegal drugs. Some items may interact with your medicine. What should I watch for while using this medicine? Your condition will be monitored carefully while you are receiving this medicine. You may need blood work done while you are taking this medicine. Talk to your health care provider about your risk of cancer. You may be more at risk for certain types of cancer if you take this medicine. If you are going to need a MRI, CT scan, or other procedure, tell your doctor that you are using this medicine (On-Body Injector only). What side effects may I notice from receiving this medicine? Side effects that you should report to your doctor or health care professional as soon as possible:  allergic reactions (skin rash, itching or hives, swelling of the face, lips, or tongue)  back pain  dizziness  fever  pain, redness, or irritation at site where injected  pinpoint red spots on the skin  red or  dark-brown urine  shortness of breath or breathing problems  stomach or side pain, or pain at the shoulder  swelling  tiredness  trouble passing urine or change in the amount of urine  unusual bruising or bleeding Side effects that usually do not require medical attention (report to your doctor or health care professional if they continue or are bothersome):  bone pain  muscle pain This list may not describe all possible side effects. Call your doctor for medical advice about side effects. You may report side effects to FDA at 1-800-FDA-1088. Where should I keep my medicine? Keep out of the reach of children. If you are using this medicine at home, you will be instructed on how to store it. Throw away any unused medicine after the expiration date on the label. NOTE: This sheet is a summary. It may not cover all possible information. If you have questions about this medicine,  talk to your doctor, pharmacist, or health care provider.  2021 Elsevier/[employer] Standard (2019-11-14 13:20:51)

## 2021-01-06 NOTE — Addendum Note (Signed)
Addended by: Chauncey Cruel on: 01/06/2021 06:07 PM   Modules accepted: Level of Service

## 2021-01-06 NOTE — Telephone Encounter (Signed)
Scheduled appts per 3/1 los. Called pt, pt phone number invalid or no longer in service. Pt to get updated appt calendar at 3/3 visit per appt notes.

## 2021-01-07 ENCOUNTER — Telehealth: Payer: Self-pay

## 2021-01-07 NOTE — Telephone Encounter (Signed)
Returned call to pt regarding pt repotting some small amounts of blood in her urine. Pt got cytoxan , explained to pt that bladder irritation and bleeding can occur from this drug. Encouraged pt to push fluids and make sure she empties her bladder all the way post each void. Pt to call if it gets worse. Pt verbalized understanding. Pt on the schedule for fluids tomorrow.

## 2021-01-08 ENCOUNTER — Other Ambulatory Visit: Payer: Self-pay

## 2021-01-08 ENCOUNTER — Inpatient Hospital Stay: Payer: 59

## 2021-01-08 VITALS — BP 132/68 | HR 93 | Temp 97.2°F | Resp 18

## 2021-01-08 DIAGNOSIS — Z95828 Presence of other vascular implants and grafts: Secondary | ICD-10-CM

## 2021-01-08 DIAGNOSIS — Z5111 Encounter for antineoplastic chemotherapy: Secondary | ICD-10-CM | POA: Diagnosis not present

## 2021-01-08 MED ORDER — HEPARIN SOD (PORK) LOCK FLUSH 100 UNIT/ML IV SOLN
500.0000 [IU] | Freq: Once | INTRAVENOUS | Status: AC
  Administered 2021-01-08: 500 [IU]
  Filled 2021-01-08: qty 5

## 2021-01-08 MED ORDER — SODIUM CHLORIDE 0.9% FLUSH
10.0000 mL | Freq: Once | INTRAVENOUS | Status: AC
  Administered 2021-01-08: 10 mL
  Filled 2021-01-08: qty 10

## 2021-01-08 MED ORDER — SODIUM CHLORIDE 0.9 % IV SOLN
INTRAVENOUS | Status: DC
  Filled 2021-01-08 (×2): qty 250

## 2021-01-08 NOTE — Progress Notes (Signed)
Pt discharged to home. VSS.

## 2021-01-08 NOTE — Patient Instructions (Signed)
Rehydration, Adult Rehydration is the replacement of body fluids, salts, and minerals (electrolytes) that are lost during dehydration. Dehydration is when there is not enough water or other fluids in the body. This happens when you lose more fluids than you take in. Common causes of dehydration include:  Not drinking enough fluids. This can occur when you are ill or doing activities that require a lot of energy, especially in hot weather.  Conditions that cause loss of water or other fluids, such as diarrhea, vomiting, sweating, or urinating a lot.  Other illnesses, such as fever or infection.  Certain medicines, such as those that remove excess fluid from the body (diuretics). Symptoms of mild or moderate dehydration may include thirst, dry lips and mouth, and dizziness. Symptoms of severe dehydration may include increased heart rate, confusion, fainting, and not urinating. For severe dehydration, you may need to get fluids through an IV at the hospital. For mild or moderate dehydration, you can usually rehydrate at home by drinking certain fluids as told by your health care provider. What are the risks? Generally, rehydration is safe. However, taking in too much fluid (overhydration) can be a problem. This is rare. Overhydration can cause an electrolyte imbalance, kidney failure, or a decrease in salt (sodium) levels in the body. Supplies needed You will need an oral rehydration solution (ORS) if your health care provider tells you to use one. This is a drink to treat dehydration. It can be found in pharmacies and retail stores. How to rehydrate Fluids Follow instructions from your health care provider for rehydration. The kind of fluid and the amount you should drink depend on your condition. In general, you should choose drinks that you prefer.  If told by your health care provider, drink an ORS. ? Make an ORS by following instructions on the package. ? Start by drinking small amounts,  about  cup (120 mL) every 5-10 minutes. ? Slowly increase how much you drink until you have taken the amount recommended by your health care provider.  Drink enough clear fluids to keep your urine pale yellow. If you were told to drink an ORS, finish it first, then start slowly drinking other clear fluids. Drink fluids such as: ? Water. This includes sparkling water and flavored water. Drinking only water can lead to having too little sodium in your body (hyponatremia). Follow the advice of your health care provider. ? Water from ice chips you suck on. ? Fruit juice with water you add to it (diluted). ? Sports drinks. ? Hot or cold herbal teas. ? Broth-based soups. ? Milk or milk products. Food Follow instructions from your health care provider about what to eat while you rehydrate. Your health care provider may recommend that you slowly begin eating regular foods in small amounts.  Eat foods that contain a healthy balance of electrolytes, such as bananas, oranges, potatoes, tomatoes, and spinach.  Avoid foods that are greasy or contain a lot of sugar. In some cases, you may get nutrition through a feeding tube that is passed through your nose and into your stomach (nasogastric tube, or NG tube). This may be done if you have uncontrolled vomiting or diarrhea.   Beverages to avoid Certain beverages may make dehydration worse. While you rehydrate, avoid drinking alcohol.   How to tell if you are recovering from dehydration You may be recovering from dehydration if:  You are urinating more often than before you started rehydrating.  Your urine is pale yellow.  Your energy level   improves.  You vomit less frequently.  You have diarrhea less frequently.  Your appetite improves or returns to normal.  You feel less dizzy or less light-headed.  Your skin tone and color start to look more normal. Follow these instructions at home:  Take over-the-counter and prescription medicines only  as told by your health care provider.  Do not take sodium tablets. Doing this can lead to having too much sodium in your body (hypernatremia). Contact a health care provider if:  You continue to have symptoms of mild or moderate dehydration, such as: ? Thirst. ? Dry lips. ? Slightly dry mouth. ? Dizziness. ? Dark urine or less urine than normal. ? Muscle cramps.  You continue to vomit or have diarrhea. Get help right away if you:  Have symptoms of dehydration that get worse.  Have a fever.  Have a severe headache.  Have been vomiting and the following happens: ? Your vomiting gets worse or does not go away. ? Your vomit includes blood or green matter (bile). ? You cannot eat or drink without vomiting.  Have problems with urination or bowel movements, such as: ? Diarrhea that gets worse or does not go away. ? Blood in your stool (feces). This may cause stool to look black and tarry. ? Not urinating, or urinating only a small amount of very dark urine, within 6-8 hours.  Have trouble breathing.  Have symptoms that get worse with treatment. These symptoms may represent a serious problem that is an emergency. Do not wait to see if the symptoms will go away. Get medical help right away. Call your local emergency services (911 in the [place] Do not drive yourself to the hospital. Summary  Rehydration is the replacement of body fluids and minerals (electrolytes) that are lost during dehydration.  Follow instructions from your health care provider for rehydration. The kind of fluid and amount you should drink depend on your condition.  Slowly increase how much you drink until you have taken the amount recommended by your health care provider.  Contact your health care provider if you continue to show signs of mild or moderate dehydration. This information is not intended to replace advice given to you by your health care provider. Make sure you discuss any questions you have with  your health care provider. Document Revised: 12/24/2019 Document Reviewed: 11/03/2019 Elsevier Patient Education  2021 [employer]  

## 2021-01-11 ENCOUNTER — Encounter: Payer: Self-pay | Admitting: Licensed Clinical Social Worker

## 2021-01-11 ENCOUNTER — Telehealth: Payer: Self-pay | Admitting: Licensed Clinical Social Worker

## 2021-01-11 ENCOUNTER — Ambulatory Visit: Payer: Self-pay | Admitting: Licensed Clinical Social Worker

## 2021-01-11 ENCOUNTER — Other Ambulatory Visit: Payer: 59

## 2021-01-11 ENCOUNTER — Ambulatory Visit: Payer: 59

## 2021-01-11 DIAGNOSIS — Z170 Estrogen receptor positive status [ER+]: Secondary | ICD-10-CM

## 2021-01-11 DIAGNOSIS — C50411 Malignant neoplasm of upper-outer quadrant of right female breast: Secondary | ICD-10-CM

## 2021-01-11 DIAGNOSIS — Z1379 Encounter for other screening for genetic and chromosomal anomalies: Secondary | ICD-10-CM

## 2021-01-11 DIAGNOSIS — Z803 Family history of malignant neoplasm of breast: Secondary | ICD-10-CM

## 2021-01-11 NOTE — Progress Notes (Signed)
HPI:  [name] was previously seen in the [facility] due to a personal and family history of cancer and concerns regarding a hereditary predisposition to cancer. Please refer to our prior cancer genetics clinic note for more information regarding our discussion, assessment and recommendations, at the time. [name]'s recent genetic test results were disclosed to her, as were recommendations warranted by these results. These results and recommendations are discussed in more detail below.  CANCER HISTORY:  Oncology History Overview Note  Cancer Staging Malignant neoplasm of upper-outer quadrant of right breast in female, estrogen receptor positive (HCC) Staging form: Breast, AJCC 8th Edition - Clinical stage from 08/18/2020: Stage IA (cT1c, cN0, cM0, G3, ER+, PR+, HER2-) - Unsigned    Malignant neoplasm of upper-outer quadrant of right breast in female, estrogen receptor positive (HCC)  07/20/2020 Mammogram   IMPRESSION The 1.1 cm irregular mass in the uper outer right breast, 10-11:00 position, 7cm form the nipple is indeterminate.    08/09/2020 Initial Biopsy   Diagnosis 08/09/20 Breast, right, needle core biopsy -INVASIVE MAMMARY CARCINOMA -MAMMARY CARCINOMA IN SITU -CALCIFICATIONS -SEE COMMENT   -Grade 3  E-cadherin is POSITIVE supporting a ductal origin   08/09/2020 Receptors her2   ER 95% PR - 90% Ki67 - 30% HER2 NEGATIVE   08/13/2020 Initial Diagnosis   Malignant neoplasm of upper-outer quadrant of right breast in female, estrogen receptor positive (HCC)   10/04/2020 Cancer Staging   Staging form: Breast, AJCC 8th Edition - Pathologic stage from 10/04/2020: Stage IA (pT1c, pN1a, cM0, G2, ER+, PR+, HER2-) - Signed by Feng, Yan, MD on 10/20/2020   10/04/2020 Surgery   RIGHT BREAST REDUCTION LUMPECTOMY WITH RADIOACTIVE SEED AND SENTINEL LYMPH NODE BIOPSY and BILATERAL MAMMARY REDUCTION  (BREAST) by Dr Toth and Dr Dillingham    10/04/2020 Pathology  Results   FINAL MICROSCOPIC DIAGNOSIS:   A. LYMPH NODE, RIGHT AXILLARY SENTINEL, BIOPSY:  -  No carcinoma identified in one lymph node (0/1)   B. LYMPH NODE, RIGHT AXILLARY SENTINEL, BIOPSY  -  Metastatic carcinoma involving one lymph node (1/1)  -  No extracapsular extension identified   C. LYMPH NODE, RIGHT AXILLARY SENTINEL #2, BIOPSY:  -  No carcinoma identified in one lymph node (0/1)   D. BREAST, RIGHT, LUMPECTOMY:  -  Invasive ductal carcinoma, Nottingham grade 2 of 3, 1.2 cm  -  Ductal carcinoma in-situ, intermediate grade  -  Calcifications associated with carcinoma  -  Margins uninvolved by carcinoma (<0.1 cm; medial)  -  Lymphovascular space invasion present  -  Previous biopsy site changes present  -  See oncology table and comment below   E. BREAST, RIGHT DEEP MARGIN, EXCISION:  -  Residual invasive ductal carcinoma  -  Margin uninvolved by carcinoma (0.1 cm; posterior)   F. BREAST, RIGHT SUPERIOR MARGIN, EXCISION:  -  Benign adenosis with calcifications  -  No residual carcinoma identified   G. BREAST, RIGHT INFERIOR LATERAL MARGIN, EXCISION:  -  Focal lobular neoplasia, adenosis and fibrocystic changes with  calcifications  -  No residual carcinoma identified   H. SKIN, RIGHT BREAST, EXCISION:  -  Benign skin and subcutaneous tissue with chronic inflammation  -  No malignancy identified   I. BREAST, RIGHT SUPERIOR NIPPLE, BIOPSY:  -  Benign breast tissue  -  No malignancy identified   J. BREAST, RIGHT LATERAL, EXCISION:  -  Adenosis and fibrocystic changes with calcifications  -  No residual carcinoma identified     K. BREAST, LEFT, MAMMOPLASTY:  -  Adenosis, columnar cell and fibrocystic changes with calcifications  -  No malignancy identified    12/14/2020 -  Chemotherapy    Patient is on Treatment Plan: BREAST TC Q21D       Genetic Testing   Negative genetic testing. No pathogenic variants identified on the [facility] Cancer[facility]-Expanded panel. The  report date is 01/10/2021.   The CancerNext-Expanded gene panel offered by [facility] and includes sequencing and rearrangement analysis for the following 77 genes: IP, ALK, APC*, ATM*, AXIN2, BAP1, BARD1, BLM, BMPR1A, BRCA1*, BRCA2*, BRIP1*, CDC73, CDH1*,CDK4, CDKN1B, CDKN2A, CHEK2*, CTNNA1, DICER1, FANCC, FH, FLCN, GALNT12, KIF1B, LZTR1, MAX, MEN1, MET, MLH1*, MSH2*, MSH3, MSH6*, MUTYH*, NBN, NF1*, NF2, NTHL1, PALB2*, PHOX2B, PMS2*, POT1, PRKAR1A, PTCH1, PTEN*, RAD51C*, RAD51D*,RB1, RECQL, RET, SDHA, SDHAF2, SDHB, SDHC, SDHD, SMAD4, SMARCA4, SMARCB1, SMARCE1, STK11, SUFU, TMEM127, TP53*,TSC1, TSC2, VHL and XRCC2 (sequencing and deletion/duplication); EGFR, EGLN1, HOXB13, KIT, MITF, PDGFRA, POLD1 and POLE (sequencing only); EPCAM and GREM1 (deletion/duplication only).      FAMILY HISTORY:  We obtained a detailed, 4-generation family history.  Significant diagnoses are listed below: Family History  Problem Relation Age of Onset  . Heart disease Mother   . Diabetes Mother   . Stroke Mother   . Heart attack Mother   . Atrial fibrillation Mother   . Hypertension Mother   . Hyperlipidemia Mother   . COPD Father   . Hypertension Father   . Hyperlipidemia Father   . Breast cancer Maternal Aunt 65       x2 age 75  . Diabetes Maternal Grandfather    [name] has 2 daughters. She does not have siblings.  [name]'s mother died at 85, no cancer. Patient had 4 maternal aunts, 1 uncle. One aunt had breast cancer two separate times, in her 60s and then in her 70s. She is living at 79. No known cousins with cancer. Grandparents passed in their 70s-80s.  [name]'s father died at 74. Patient has 1 paternal uncle and 1 paternal aunt. No known cancers on this side of the family. Paternal grandmother passed at 32 due to a brain aneurysm. Grandfather died in his 80s.   [name] is unaware of previous family history of genetic testing for hereditary cancer risks. Patient's maternal ancestors  are of European descent, and paternal ancestors are of European descent. There is no reported Ashkenazi Jewish ancestry. There is no known consanguinity.     GENETIC TEST RESULTS: Genetic testing reported out on 01/10/2021 through the [facility]-Expanded cancer panel found no pathogenic mutations.   The CancerNext-Expanded  gene panel offered by [facility] and includes sequencing and rearrangement analysis for the following 77 genes: IP, ALK, APC*, ATM*, AXIN2, BAP1, BARD1, BLM, BMPR1A, BRCA1*, BRCA2*, BRIP1*, CDC73, CDH1*,CDK4, CDKN1B, CDKN2A, CHEK2*, CTNNA1, DICER1, FANCC, FH, FLCN, GALNT12, KIF1B, LZTR1, MAX, MEN1, MET, MLH1*, MSH2*, MSH3, MSH6*, MUTYH*, NBN, NF1*, NF2, NTHL1, PALB2*, PHOX2B, PMS2*, POT1, PRKAR1A, PTCH1, PTEN*, RAD51C*, RAD51D*,RB1, RECQL, RET, SDHA, SDHAF2, SDHB, SDHC, SDHD, SMAD4, SMARCA4, SMARCB1, SMARCE1, STK11, SUFU, TMEM127, TP53*,TSC1, TSC2, VHL and XRCC2 (sequencing and deletion/duplication); EGFR, EGLN1, HOXB13, KIT, MITF, PDGFRA, POLD1 and POLE (sequencing only); EPCAM and GREM1 (deletion/duplication only).  The test report has been scanned into EPIC and is located under the Molecular Pathology section of the Results Review tab.  A portion of the result report is included below for reference.    We discussed with [name] that because current genetic testing is not perfect, it is   possible there may be a gene mutation in one of these genes that current testing cannot detect, but that chance is small.  We also discussed, that there could be another gene that has not yet been discovered, or that we have not yet tested, that is responsible for the cancer diagnoses in the family. It is also possible there is a hereditary cause for the cancer in the family that [name] did not inherit and therefore was not identified in her testing.  Therefore, it is important to remain in touch with cancer genetics in the future so that we can continue to offer [name] the most  up to date genetic testing.   ADDITIONAL GENETIC TESTING: We discussed with [name] that her genetic testing was fairly extensive.  If there are genes identified to increase cancer risk that can be analyzed in the future, we would be happy to discuss and coordinate this testing at that time.    CANCER SCREENING RECOMMENDATIONS: [name]'s test result is considered negative (normal).  This means that we have not identified a hereditary cause for her  personal and family history of cancer at this time. Most cancers happen by chance and this negative test suggests that her cancer may fall into this category.    While reassuring, this does not definitively rule out a hereditary predisposition to cancer. It is still possible that there could be genetic mutations that are undetectable by current technology. There could be genetic mutations in genes that have not been tested or identified to increase cancer risk.  Therefore, it is recommended she continue to follow the cancer management and screening guidelines provided by her oncology and primary healthcare provider.   An individual's cancer risk and medical management are not determined by genetic test results alone. Overall cancer risk assessment incorporates additional factors, including personal medical history, family history, and any available genetic information that may result in a personalized plan for cancer prevention and surveillance.  RECOMMENDATIONS FOR FAMILY MEMBERS:  Relatives in this family might be at some increased risk of developing cancer, over the general population risk, simply due to the family history of cancer.  We recommended female relatives in this family have a yearly mammogram beginning at age 40, or 10 years younger than the earliest onset of cancer, an annual clinical breast exam, and perform monthly breast self-exams. Female relatives in this family should also have a gynecological exam as recommended by their primary  provider.  All family members should be referred for colonoscopy starting at age 45.   FOLLOW-UP: Lastly, we discussed with [name] that cancer genetics is a rapidly advancing field and it is possible that new genetic tests will be appropriate for her and/or her family members in the future. We encouraged her to remain in contact with cancer genetics on an annual basis so we can update her personal and family histories and let her know of advances in cancer genetics that may benefit this family.   Our contact number was provided. [name]'s questions were answered to her satisfaction, and she knows she is welcome to call us at anytime with additional questions or concerns.   Brianna Cowan, MS, LCGC Genetic Counselor [email or web address] Phone: [phone]  

## 2021-01-11 NOTE — Telephone Encounter (Signed)
Revealed negative genetic testing.  This normal result is reassuring and indicates that it is unlikely Cassandra Cox's cancer is due to a hereditary cause.  It is unlikely that there is an increased risk of another cancer due to a mutation in one of these genes.  However, genetic testing is not perfect, and cannot definitively rule out a hereditary cause.  It will be important for her to keep in contact with genetics to learn if any additional testing may be needed in the future.

## 2021-01-14 ENCOUNTER — Telehealth: Payer: Self-pay

## 2021-01-14 NOTE — Telephone Encounter (Signed)
Pt contacted about her appointments. Verified that her treatment is every 21 days times 4 like pt thought. [place] message sent to correct schedule.

## 2021-01-17 ENCOUNTER — Ambulatory Visit: Payer: 59 | Attending: General Surgery

## 2021-01-17 ENCOUNTER — Ambulatory Visit: Payer: Self-pay

## 2021-01-17 ENCOUNTER — Other Ambulatory Visit: Payer: Self-pay

## 2021-01-17 DIAGNOSIS — Z483 Aftercare following surgery for neoplasm: Secondary | ICD-10-CM | POA: Insufficient documentation

## 2021-01-17 NOTE — Therapy (Signed)
[place], [place], [address] Phone: [phone]   Fax:  [phone]  Physical Therapy Treatment  Patient Details  Name: Cassandra Cox MRN: [id number] Date of Birth: 09/03/1961 Referring Provider (PT): Dr. Autumn Messing   Encounter Date: 01/17/2021   PT End of Session - 01/17/21 1138    Visit Number 3   # unchanged due to screen only   Number of Visits 3    Date for PT Re-Evaluation 10/13/20    PT Start Time 1127    PT Stop Time 1138    PT Time Calculation (min) 11 min    Activity Tolerance Patient tolerated treatment well    Behavior During Therapy [facility] for tasks assessed/performed           Past Medical History:  Diagnosis Date  . Anxiety   . Complication of anesthesia   . Coronary artery disease    a. 11/2016 Cath/PCI: LM nl, LAD 30ost, 72m(3.0x18 Resolute Integrity DES--3.75), LCX nl, RCA nl; b. MV 11/18: negative for ischemia, EF 72%, low risk  . Depression   . Diabetes mellitus without complication ([facility]    type 2  . Diastolic dysfunction    a. 11/2016 Echo: EF 55-60%, Gr2 DD, LAE, nl RV size/fxn.  . Family history of breast cancer   . History of kidney stones   . Hyperlipemia \\[id number]\ . Hypothyroidism   . Kidney stone 08/2016  . Mitral valve prolapse   . MVP (mitral valve prolapse)   . Neuromuscular disorder ([facility]    bilateral feet  . PONV (postoperative nausea and vomiting)   . STEMI (ST elevation myocardial infarction) ([facility] 12/02/2016  . Vitamin B 12 deficiency     Past Surgical History:  Procedure Laterality Date  . ABDOMINAL HYSTERECTOMY  2003   TAH due to adenomyosis  . BREAST LUMPECTOMY WITH RADIOACTIVE SEED AND SENTINEL LYMPH NODE BIOPSY Right 10/04/2020   Procedure: RIGHT BREAST REDUCTION LUMPECTOMY WITH RADIOACTIVE SEED AND SENTINEL LYMPH NODE BIOPSY;  Surgeon: TJovita Kussmaul MD;  [facility]  Service: General;  Laterality: Right;  . BREAST  REDUCTION SURGERY  1990  . BREAST REDUCTION SURGERY Bilateral 10/04/2020   Procedure: BILATERAL MAMMARY REDUCTION  (BREAST);  Surgeon: DWallace Going DO;  [facility]  Service: Plastics;  Laterality: Bilateral;  . CARDIAC CATHETERIZATION N/A 12/02/2016   Procedure: Coronary Stent Intervention;  Surgeon: CNelva Bush MD;  [facility];  Service: Cardiovascular;  Laterality: N/A;  . CARDIAC CATHETERIZATION N/A 12/02/2016   Procedure: Left Heart Cath and Coronary Angiography;  Surgeon: CNelva Bush MD;  [facility];  Service: Cardiovascular;  Laterality: N/A;  . CARDIAC CATHETERIZATION N/A 12/02/2016   Procedure: Intravascular Ultrasound/IVUS;  Surgeon: CNelva Bush MD;  [facility];  Service: Cardiovascular;  Laterality: N/A;  . CORONARY STENT INTERVENTION    . MOUTH SURGERY     Dental implant  . PORTACATH PLACEMENT Left 12/13/2020   Procedure: INSERTION PORT-A-CATH LEFT SUBCLAVIAN;  Surgeon: TJovita Kussmaul MD;  [facility]  Service: General;  Laterality: Left;    There were no vitals filed for this visit.   Subjective Assessment - 01/17/21 1129    Subjective Pt returns for her 3 month L-Dex screen.    Pertinent History Patient was diagnosed on 06/30/2020 with right grade III invasive ductal carcinoma breast cancer. Patient underwent a right lumpectomy and sentinel  node biopsy (1/3 nodes positive) on 10/04/2020 with a bilateral reduction. It is ER/PR positive and HER2 negative with a Ki67 of 30%.                  L-DEX FLOWSHEETS - 01/17/21 1100      L-DEX LYMPHEDEMA SCREENING   Measurement Type Unilateral    L-DEX MEASUREMENT EXTREMITY Upper Extremity    POSITION  Standing    DOMINANT SIDE Right    At Risk Side Right    BASELINE SCORE (UNILATERAL) -2.2    L-DEX SCORE (UNILATERAL) -1.1    VALUE CHANGE (UNILAT) 1.1                                   PT Long Term Goals - 11/25/20 1135      PT LONG TERM GOAL #1   Title Patient will demonstrate she has regained full shoulder ROM and function post operatively compared to baselines.    Time 8    Period Weeks    Status Achieved                 Plan - 01/17/21 1139    Clinical Impression Statement Pt returns for her 3 month L-Dex screen. Her change from baseline of 1.1 is WNLs so no further treatment is required at this time except to cont every 3 month L-Dex screens which pt is agreeable to.    PT Next Visit Plan Cont every 3 month L-Dex screens for up to 2 years from her SLNB.    Consulted and Agree with Plan of Care Patient           Patient will benefit from skilled therapeutic intervention in order to improve the following deficits and impairments:     Visit Diagnosis: Aftercare following surgery for neoplasm     Problem List Patient Active Problem List   Diagnosis Date Noted  . Genetic testing 01/11/2021  . Port-A-Cath in place 12/14/2020  . Family history of breast cancer   . Malignant neoplasm of upper-outer quadrant of right breast in female, estrogen receptor positive ([place]) 08/13/2020  . Shortness of breath 02/19/2020  . Essential hypertension 09/09/2018  . Influenza B 10/18/2017  . Unstable angina ([place]) 09/14/2017  . Coronary artery disease involving native coronary artery of native heart without angina pectoris 07/18/2017  . Insomnia 08/25/2015  . Hot flashes, menopausal 08/25/2015  . Well woman exam 07/22/2015  . Family history of diabetes mellitus 07/15/2014  . Vitamin D deficiency 11/03/2009  . Hyperlipidemia LDL goal <70 11/03/2009  . ANXIETY 11/03/2009  . Depression 11/03/2009    Cassandra Cox, PTA 01/17/2021, 11:41 AM  [place], [place], [address] Phone: [phone]   Fax:  [phone]  Name: Cassandra Cox MRN: [id number] Date of Birth: February 15, 1961

## 2021-01-18 ENCOUNTER — Ambulatory Visit: Payer: 59

## 2021-01-18 ENCOUNTER — Other Ambulatory Visit: Payer: 59

## 2021-01-18 ENCOUNTER — Ambulatory Visit: Payer: 59 | Admitting: Oncology

## 2021-01-21 ENCOUNTER — Other Ambulatory Visit: Payer: Self-pay

## 2021-01-21 DIAGNOSIS — Z170 Estrogen receptor positive status [ER+]: Secondary | ICD-10-CM

## 2021-01-21 DIAGNOSIS — C50411 Malignant neoplasm of upper-outer quadrant of right female breast: Secondary | ICD-10-CM

## 2021-01-24 NOTE — Progress Notes (Signed)
[place]  [phone] [phone]     ID: PETER DAQUILA DOB: 29-Feb-1961  MR#: [id number]  WNI#:[id number]  Patient Care Team: Pcp, No as PCP - General End, Harrell Gave, MD as PCP - Cardiology (Cardiology) Mauro Kaufmann, RN as Oncology Nurse Navigator Rockwell Germany, RN as Oncology Nurse Navigator Jovita Kussmaul, MD as Consulting Physician (General Surgery) Truitt Merle, MD as Consulting Physician (Hematology) Kyung Rudd, MD as Consulting Physician (Radiation Oncology) Chauncey Cruel, MD OTHER MD:  CHIEF COMPLAINT: estrogen receptor positive breast cancer  CURRENT TREATMENT: adjuvant chemotherapy    INTERVAL HISTORY: Nevia returns today for follow up and treatment of her estrogen receptor positive breast cancer.  She began adjuvant chemotherapy, consisting of docetaxel and cyclophosphamide every 21 days x4, on 12/14/2020. Today is day 1 cycle 3 of 4 planned.   REVIEW OF SYSTEMS: Deane is doing remarkably well with her chemotherapy.  She is already thinking about radiation after chemo.  She has had mild diarrhea and mild constipation.  She has not had to do anything in particular about this except stay hydrated.  She does benefit from fluids on day 3 and day 5 after each cycle.  She has not had mouth sores although she has had a sore mouth.  She has no peripheral neuropathy in her hands.  She had some peripheral neuropathy in the balls of her feet at baseline and she says this has not changed.  Detailed review of systems today was otherwise stable   COVID 19 VACCINATION STATUS: Refuses vaccination   HISTORY OF CURRENT ILLNESS: From my original evaluation note:  TANECIA MCCAY had routine screening mammography on 06/30/2020 showing a possible abnormality in the right breast. She underwent right diagnostic mammography with tomography and right breast ultrasonography [facility] on 07/20/2020 showing: breast density category C; 1.1 cm irregular  mass in right breast at 10-11 o'clock, better seen on mammogram; no suspicious right axillary lymph nodes.  Accordingly on 08/09/2020 she proceeded to biopsy of the right breast area in question. The pathology from this procedure  showed: invasive and in situ mammary carcinoma, e-cadherin positive, grade 3. Prognostic indicators significant for: estrogen receptor, 95% positive and progesterone receptor, 90% positive. Proliferation marker Ki67 at 30%. HER2 negative by immunohistochemistry.  She underwent right lumpectomy on 10/04/2020 under Dr. Marlou Starks. Pathology from the procedure [id number]) showed: invasive ductal carcinoma, grade 2, 1.2 cm; ductal carcinoma in situ, intermediate grade; calcifications associated with carcinoma; margins uninvolved; lymphovascular space invasion present.  Out of three biopsied lymph nodes, one revealed metastatic carcinoma (1/3). The positive lymph node showed a disrupted capsule that is suspicious for extracapsular extension.  Mammaprint was performed on the final surgical sample and revealed high risk.  The patient's subsequent history is as detailed below.   PAST MEDICAL HISTORY: Past Medical History:  Diagnosis Date  . Anxiety   . Complication of anesthesia   . Coronary artery disease    a. 11/2016 Cath/PCI: LM nl, LAD 30ost, 42m(3.0x18 Resolute Integrity DES--3.75), LCX nl, RCA nl; b. MV 11/18: negative for ischemia, EF 72%, low risk  . Depression   . Diabetes mellitus without complication ([place]    type 2  . Diastolic dysfunction    a. 11/2016 Echo: EF 55-60%, Gr2 DD, LAE, nl RV size/fxn.  . Family history of breast cancer   . History of kidney stones   . Hyperlipemia \\[id number]\ . Hypothyroidism   . Kidney stone 08/2016  . Mitral valve  prolapse   . MVP (mitral valve prolapse)   . Neuromuscular disorder ([place])    bilateral feet  . PONV (postoperative nausea and vomiting)   . STEMI (ST elevation myocardial infarction) ([place]) 12/02/2016  . Vitamin  B 12 deficiency     PAST SURGICAL HISTORY: Past Surgical History:  Procedure Laterality Date  . ABDOMINAL HYSTERECTOMY  2003   TAH due to adenomyosis  . BREAST LUMPECTOMY WITH RADIOACTIVE SEED AND SENTINEL LYMPH NODE BIOPSY Right 10/04/2020   Procedure: RIGHT BREAST REDUCTION LUMPECTOMY WITH RADIOACTIVE SEED AND SENTINEL LYMPH NODE BIOPSY;  Surgeon: Jovita Kussmaul, MD;  Location: [facility];  Service: General;  Laterality: Right;  . BREAST REDUCTION SURGERY  1990  . BREAST REDUCTION SURGERY Bilateral 10/04/2020   Procedure: BILATERAL MAMMARY REDUCTION  (BREAST);  Surgeon: Wallace Going, DO;  Location: [facility];  Service: Plastics;  Laterality: Bilateral;  . CARDIAC CATHETERIZATION N/A 12/02/2016   Procedure: Coronary Stent Intervention;  Surgeon: Nelva Bush, MD;  Location: [facility];  Service: Cardiovascular;  Laterality: N/A;  . CARDIAC CATHETERIZATION N/A 12/02/2016   Procedure: Left Heart Cath and Coronary Angiography;  Surgeon: Nelva Bush, MD;  Location: [place] [facility];  Service: Cardiovascular;  Laterality: N/A;  . CARDIAC CATHETERIZATION N/A 12/02/2016   Procedure: Intravascular Ultrasound/IVUS;  Surgeon: Nelva Bush, MD;  Location: [place] [facility];  Service: Cardiovascular;  Laterality: N/A;  . CORONARY STENT INTERVENTION    . MOUTH SURGERY     Dental implant  . PORTACATH PLACEMENT Left 12/13/2020   Procedure: INSERTION PORT-A-CATH LEFT SUBCLAVIAN;  Surgeon: Jovita Kussmaul, MD;  Location: [facility];  Service: General;  Laterality: Left;    FAMILY HISTORY: Family History  Problem Relation Age of Onset  . Heart disease Mother   . Diabetes Mother   . Stroke Mother   . Heart attack Mother   . Atrial fibrillation Mother   . Hypertension Mother   . Hyperlipidemia Mother   . COPD Father   . Hypertension Father   . Hyperlipidemia Father   . Breast cancer Maternal Aunt 99       x2 age 29  .  Diabetes Maternal Grandfather    Her father died at age 41 and her mother at age 62. Treasure is an only child. She reports breast cancer in a maternal aunt two separate times, in her 22's and then in her 59's.   GYNECOLOGIC HISTORY:  No LMP recorded. Patient has had a hysterectomy. Menarche: 60 years old Age at first live birth: 60 years old [place] P 2 LMP 07/1997 (having periods at time of hysterectomy) Contraceptive: used oral contraceptives for 17 years without complications ([id number]) HRT used progesterone for 2-3 years, stopped with cancer diagnosis Hysterectomy? Yes, 1998 for adenomyosis BSO? 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 She lives by herself with he dachshund. Daughter Carmell [place] is 27, lives in [place], and is a homemaker; she suffers from [employer]. Daughter [place] is 80, lives in [place] and works as a Psychologist, educational.  The patient has 1 grandchild.  She is a Psychologist, forensic.    ADVANCED DIRECTIVES:    HEALTH MAINTENANCE: Social History   Tobacco Use  . Smoking status: Never Smoker  . Smokeless tobacco: Never Used  Vaping Use  . Vaping Use: Never used  Substance Use Topics  . Alcohol use: No  . Drug use: No  Colonoscopy:   PAP:   Bone density:    Allergies  Allergen Reactions  . Prednisone Other (See Comments)    REACTION: Swelling REACTION: Swelling Facial swelling REACTION: Swelling  . Sulfa Antibiotics Hives and Other (See Comments)    REACTION: Hives  . Sulfasalazine     REACTION: Hives  . Sulfonamide Derivatives     REACTION: Hives    Current Outpatient Medications  Medication Sig Dispense Refill  . ALPRAZolam (XANAX) 0.25 MG tablet Take 0.25 mg by mouth 3 (three) times daily as needed for anxiety.    Francia Greaves THYROID 60 MG tablet Take 60 mg by mouth daily.    Marland Kitchen aspirin EC 81 MG tablet Take 81 mg by mouth daily.    Marland Kitchen atorvastatin (LIPITOR) 80 MG tablet TAKE 1 TABLET BY MOUTH  DAILY AT 6PM GENERIC EQUIVALENT FOR LIPITOR 90 tablet 0  . buPROPion (WELLBUTRIN XL) 150 MG 24 hr tablet Take 300 mg by mouth daily.     . Cholecalciferol (VITAMIN D-3) 5000 UNITS TABS Take 1 tablet by mouth daily.    . clopidogrel (PLAVIX) 75 MG tablet TAKE 1 TABLET BY MOUTH DAILY GENERIC EQUIVALENT FOR PLAVIX 90 tablet 3  . Coenzyme Q10 (COQ10) 100 MG CAPS Take 100 mg by mouth daily.    . Cyanocobalamin 1000 MCG/ML KIT Inject 1,000 mcg as directed once a week.    Marland Kitchen dexamethasone (DECADRON) 4 MG tablet Take 1 tablet (4 mg total) by mouth 2 (two) times daily. Start the day before Taxotere. Then 1 tab daily the day after chemo for 2 days. 30 tablet 1  . ezetimibe (ZETIA) 10 MG tablet Take 1 tablet (10 mg total) by mouth daily. 90 tablet 0  . FLUoxetine (PROZAC) 20 MG capsule Take 20 mg by mouth daily.     Marland Kitchen HYDROcodone-acetaminophen (NORCO/VICODIN) 5-325 MG tablet Take 1-2 tablets by mouth every 6 (six) hours as needed for moderate pain or severe pain. 10 tablet 0  . ketoconazole (NIZORAL) 2 % cream Apply 1 application topically daily. 15 g 0  . levocetirizine (XYZAL) 5 MG tablet Take 1 tablet at bedtime by mouth.  2  . lidocaine-prilocaine (EMLA) cream Apply to affected area once 30 g 3  . liothyronine (CYTOMEL) 5 MCG tablet Take 5 mcg by mouth every morning.     . magnesium gluconate (MAGONATE) 500 MG tablet Take 1,000 mg by mouth at bedtime.    . metFORMIN (GLUCOPHAGE-XR) 500 MG 24 hr tablet Take 500 mg by mouth 2 (two) times daily.    . montelukast (SINGULAIR) 10 MG tablet Take 10 mg by mouth every morning.    Marland Kitchen NALTREXONE HCL PO Take 4 mg by mouth at bedtime.     . nitroGLYCERIN (NITROSTAT) 0.4 MG SL tablet Place 1 tablet (0.4 mg total) under the tongue every 5 (five) minutes as needed for chest pain. For maximum of 3 doses. 25 tablet 3  . ondansetron (ZOFRAN) 8 MG tablet Take 1 tablet (8 mg total) by mouth 2 (two) times daily as needed for refractory nausea / vomiting. Start on day 3 after  chemo. 30 tablet 1  . prochlorperazine (COMPAZINE) 10 MG tablet Take 1 tablet (10 mg total) by mouth every 6 (six) hours as needed (Nausea or vomiting). 30 tablet 1  . valACYclovir (VALTREX) 500 MG tablet Take 1 tablet (500 mg total) by mouth daily. 90 tablet 1  . vitamin C (ASCORBIC ACID) 500 MG tablet Take 500 mg by mouth daily.    Marland Kitchen  zinc gluconate 50 MG tablet Take 50 mg by mouth daily.    Marland Kitchen zolpidem (AMBIEN CR) 12.5 MG CR tablet Take 12.5 mg by mouth at bedtime as needed for sleep.     No current facility-administered medications for this visit.    OBJECTIVE: White woman in no acute distress  Vitals:   01/25/21 1145  BP: 128/66  Pulse: 93  Resp: 18  Temp: 97.7 F (36.5 C)  SpO2: 98%     Body mass index is 32.06 kg/m.   Wt Readings from Last 3 Encounters:  01/25/21 181 lb (82.1 kg)  12/21/20 181 lb 6.4 oz (82.3 kg)  12/14/20 186 lb (84.4 kg)      ECOG FS:1 - Symptomatic but completely ambulatory  No noticeable hair loss Sclerae unicteric, EOMs intact Wearing a mask No cervical or supraclavicular adenopathy Lungs no rales or rhonchi Heart regular rate and rhythm Abd soft, nontender, positive bowel sounds MSK no focal spinal tenderness, no upper extremity lymphedema Neuro: nonfocal, well oriented, appropriate affect Breasts: Status post right lumpectomy and bilateral reduction mammoplasties.  There is no evidence of local recurrence or residual disease.  Both axillae are benign  LAB RESULTS:  CMP     Component Value Date/Time   NA 136 01/04/2021 1145   NA 140 03/03/2020 1052   K 3.9 01/04/2021 1145   CL 106 01/04/2021 1145   CO2 20 (L) 01/04/2021 1145   GLUCOSE 169 (H) 01/04/2021 1145   BUN 25 (H) 01/04/2021 1145   BUN 18 03/03/2020 1052   CREATININE 0.76 01/04/2021 1145   CALCIUM 9.4 01/04/2021 1145   PROT 7.2 01/04/2021 1145   PROT 6.5 03/03/2020 1052   ALBUMIN 3.9 01/04/2021 1145   ALBUMIN 4.4 03/03/2020 1052   AST 16 01/04/2021 1145   ALT 33  01/04/2021 1145   ALKPHOS 92 01/04/2021 1145   BILITOT 0.3 01/04/2021 1145   GFRNONAA >60 01/04/2021 1145   GFRAA 111 03/03/2020 1052    No results found for: TOTALPROTELP, ALBUMINELP, A1GS, A2GS, BETS, BETA2SER, GAMS, MSPIKE, SPEI  Lab Results  Component Value Date   WBC 5.0 01/25/2021   NEUTROABS 3.3 01/25/2021   HGB 10.6 (L) 01/25/2021   HCT 32.5 (L) 01/25/2021   MCV 92.6 01/25/2021   PLT 344 01/25/2021    No results found for: LABCA2  No components found for: [id number]  No results for input(s): INR in the last 168 hours.  No results found for: LABCA2  No results found for: NIO270  No results found for: JJK093  No results found for: GHW299  No results found for: CA2729  No components found for: HGQUANT  No results found for: CEA1 / No results found for: CEA1   No results found for: AFPTUMOR  No results found for: CHROMOGRNA  No results found for: KPAFRELGTCHN, LAMBDASER, KAPLAMBRATIO (kappa/lambda light chains)  No results found for: HGBA, HGBA2QUANT, HGBFQUANT, HGBSQUAN (Hemoglobinopathy evaluation)   No results found for: LDH  No results found for: IRON, TIBC, IRONPCTSAT (Iron and TIBC)  No results found for: FERRITIN  Urinalysis    Component Value Date/Time   COLORURINE RED (A) 08/28/2016 1428   APPEARANCEUR CLOUDY (A) 08/28/2016 1428   LABSPEC 1.017 08/28/2016 1428   PHURINE 7.0 08/28/2016 1428   GLUCOSEU NEGATIVE 08/28/2016 1428   HGBUR LARGE (A) 08/28/2016 1428   BILIRUBINUR NEG 09/25/2017 1443   KETONESUR 15 (A) 08/28/2016 1428   PROTEINUR NEG 09/25/2017 [place] 08/28/2016 1428   UROBILINOGEN negative  06/12/2016 1610   NITRITE NEG 09/25/2017 1443   NITRITE NEGATIVE 08/28/2016 1428   LEUKOCYTESUR Negative 09/25/2017 1443    STUDIES: No results found.   ELIGIBLE FOR AVAILABLE RESEARCH PROTOCOL: AET  ASSESSMENT: 60 y.o. [place] woman status post right lumpectomy and sentinel lymph node sampling 10/04/2020 for a  pT1c pN1, stage IB   invasive ductal carcinoma, grade 2, estrogen and progesterone receptor positive, HER2 not amplified, with an MIB-1 of 30%  (a) a total of 3 right axillary lymph nodes removed, one positive with concern regarding extracapsular extension  (1) MammaPrint high risk predicts a significant benefit from chemotherapy  (2) adjuvant chemotherapy will consist of cyclophosphamide and docetaxel every 21 days x 4 starting 12/14/2020  (a) patient using DigniCap for hair preservation  (3) adjuvant radiation to follow.  (4) antiestrogens to start at the completion of local treatment   PLAN:  Kentrell did very well with cycle 2 and will proceed with cycle 3 today.  I do not think it is necessary for her to see me on day 8 after this cycle since she is doing so well.  It is particularly gratifying that she has been able to keep her hair thinks to the Digna cap  She is already thinking ahead to radiation and we discussed that some today.  I will alert her radiation physicians know that she will be done with chemotherapy after April 12  We are continuing IV fluids on days 3 and 5 after each cycle.  That has been helpful to her.  Total encounter time 25 minutes.Sarajane Jews C. Nephtali Docken, MD 01/25/2021 12:06 PM Medical Oncology and Hematology [facility] [place],  [address] Tel. [phone]    Fax. [phone]   This document serves as a record of services personally performed by Lurline Del, MD. It was created on his behalf by Wilburn Mylar, a trained medical scribe. The creation of this record is based on the scribe's personal observations and the provider's statements to them.   I, Lurline Del MD, have reviewed the above documentation for accuracy and completeness, and I agree with the above.   *Total Encounter Time as defined by the Centers for Medicare and Medicaid Services includes, in addition to the face-to-face time of a patient  visit (documented in the note above) non-face-to-face time: obtaining and reviewing outside history, ordering and reviewing medications, tests or procedures, care coordination (communications with other health care professionals or caregivers) and documentation in the medical record.

## 2021-01-25 ENCOUNTER — Inpatient Hospital Stay: Payer: 59

## 2021-01-25 ENCOUNTER — Inpatient Hospital Stay (HOSPITAL_BASED_OUTPATIENT_CLINIC_OR_DEPARTMENT_OTHER): Payer: 59 | Admitting: Oncology

## 2021-01-25 ENCOUNTER — Other Ambulatory Visit: Payer: Self-pay

## 2021-01-25 ENCOUNTER — Encounter: Payer: Self-pay | Admitting: *Deleted

## 2021-01-25 VITALS — BP 128/66 | HR 93 | Temp 97.7°F | Resp 18 | Ht 63.0 in | Wt 181.0 lb

## 2021-01-25 DIAGNOSIS — C50411 Malignant neoplasm of upper-outer quadrant of right female breast: Secondary | ICD-10-CM

## 2021-01-25 DIAGNOSIS — Z5111 Encounter for antineoplastic chemotherapy: Secondary | ICD-10-CM | POA: Diagnosis not present

## 2021-01-25 DIAGNOSIS — Z170 Estrogen receptor positive status [ER+]: Secondary | ICD-10-CM

## 2021-01-25 DIAGNOSIS — Z95828 Presence of other vascular implants and grafts: Secondary | ICD-10-CM

## 2021-01-25 LAB — CBC WITH DIFFERENTIAL (CANCER CENTER ONLY)
Abs Immature Granulocytes: 0.01 10*3/uL (ref 0.00–0.07)
Basophils Absolute: 0.1 10*3/uL (ref 0.0–0.1)
Basophils Relative: 1 %
Eosinophils Absolute: 0 10*3/uL (ref 0.0–0.5)
Eosinophils Relative: 0 %
HCT: 32.5 % — ABNORMAL LOW (ref 36.0–46.0)
Hemoglobin: 10.6 g/dL — ABNORMAL LOW (ref 12.0–15.0)
Immature Granulocytes: 0 %
Lymphocytes Relative: 20 %
Lymphs Abs: 1 10*3/uL (ref 0.7–4.0)
MCH: 30.2 pg (ref 26.0–34.0)
MCHC: 32.6 g/dL (ref 30.0–36.0)
MCV: 92.6 fL (ref 80.0–100.0)
Monocytes Absolute: 0.6 10*3/uL (ref 0.1–1.0)
Monocytes Relative: 13 %
Neutro Abs: 3.3 10*3/uL (ref 1.7–7.7)
Neutrophils Relative %: 66 %
Platelet Count: 344 10*3/uL (ref 150–400)
RBC: 3.51 MIL/uL — ABNORMAL LOW (ref 3.87–5.11)
RDW: 17.7 % — ABNORMAL HIGH (ref 11.5–15.5)
WBC Count: 5 10*3/uL (ref 4.0–10.5)
nRBC: 0 % (ref 0.0–0.2)

## 2021-01-25 LAB — CMP (CANCER CENTER ONLY)
ALT: 50 U/L — ABNORMAL HIGH (ref 0–44)
AST: 23 U/L (ref 15–41)
Albumin: 3.6 g/dL (ref 3.5–5.0)
Alkaline Phosphatase: 87 U/L (ref 38–126)
Anion gap: 10 (ref 5–15)
BUN: 15 mg/dL (ref 8–23)
CO2: 24 mmol/L (ref 22–32)
Calcium: 9 mg/dL (ref 8.9–10.3)
Chloride: 106 mmol/L (ref 98–111)
Creatinine: 0.72 mg/dL (ref 0.44–1.00)
GFR, Estimated: >60 mL/min (ref >60)
Glucose, Bld: 160 mg/dL — ABNORMAL HIGH (ref 70–99)
Potassium: 3.9 mmol/L (ref 3.5–5.1)
Sodium: 140 mmol/L (ref 135–145)
Total Bilirubin: 0.5 mg/dL (ref 0.3–1.2)
Total Protein: 6.2 g/dL — ABNORMAL LOW (ref 6.5–8.1)

## 2021-01-25 MED ORDER — SODIUM CHLORIDE 0.9% FLUSH
10.0000 mL | Freq: Once | INTRAVENOUS | Status: AC
  Administered 2021-01-25: 10 mL
  Filled 2021-01-25: qty 10

## 2021-01-25 MED ORDER — SODIUM CHLORIDE 0.9 % IV SOLN
600.0000 mg/m2 | Freq: Once | INTRAVENOUS | Status: AC
  Administered 2021-01-25: 1160 mg via INTRAVENOUS
  Filled 2021-01-25: qty 58

## 2021-01-25 MED ORDER — SODIUM CHLORIDE 0.9 % IV SOLN
Freq: Once | INTRAVENOUS | Status: AC
  Filled 2021-01-25: qty 250

## 2021-01-25 MED ORDER — SODIUM CHLORIDE 0.9 % IV SOLN
10.0000 mg | Freq: Once | INTRAVENOUS | Status: AC
  Administered 2021-01-25: 10 mg via INTRAVENOUS
  Filled 2021-01-25: qty 10

## 2021-01-25 MED ORDER — DOCETAXEL CHEMO INJECTION 160 MG/16ML
75.0000 mg/m2 | Freq: Once | INTRAVENOUS | Status: AC
  Administered 2021-01-25: 140 mg via INTRAVENOUS
  Filled 2021-01-25: qty 14

## 2021-01-25 MED ORDER — SODIUM CHLORIDE 0.9% FLUSH
10.0000 mL | INTRAVENOUS | Status: DC | PRN
  Administered 2021-01-25: 10 mL
  Filled 2021-01-25: qty 10

## 2021-01-25 MED ORDER — PALONOSETRON HCL INJECTION 0.25 MG/5ML
0.2500 mg | Freq: Once | INTRAVENOUS | Status: AC
  Administered 2021-01-25: 0.25 mg via INTRAVENOUS

## 2021-01-25 MED ORDER — HEPARIN SOD (PORK) LOCK FLUSH 100 UNIT/ML IV SOLN
500.0000 [IU] | Freq: Once | INTRAVENOUS | Status: AC | PRN
  Administered 2021-01-25: 500 [IU]
  Filled 2021-01-25: qty 5

## 2021-01-25 MED ORDER — PALONOSETRON HCL INJECTION 0.25 MG/5ML
INTRAVENOUS | Status: AC
  Filled 2021-01-25: qty 5

## 2021-01-25 NOTE — Patient Instructions (Signed)
[facility] Discharge Instructions for Patients Receiving Chemotherapy  Today you received the following chemotherapy agents Taxotere; Cytoxin  To help prevent nausea and vomiting after your treatment, we encourage you to take your nausea medication as directed   If you develop nausea and vomiting that is not controlled by your nausea medication, call the clinic.   BELOW ARE SYMPTOMS THAT SHOULD BE REPORTED IMMEDIATELY:  *FEVER GREATER THAN 100.5 F  *CHILLS WITH OR WITHOUT FEVER  NAUSEA AND VOMITING THAT IS NOT CONTROLLED WITH YOUR NAUSEA MEDICATION  *UNUSUAL SHORTNESS OF BREATH  *UNUSUAL BRUISING OR BLEEDING  TENDERNESS IN MOUTH AND THROAT WITH OR WITHOUT PRESENCE OF ULCERS  *URINARY PROBLEMS  *BOWEL PROBLEMS  UNUSUAL RASH Items with * indicate a potential emergency and should be followed up as soon as possible.  Feel free to call the clinic should you have any questions or concerns. The clinic phone number is [phone].  Please show the [place] [facility] at check-in to the Emergency Department and triage nurse.

## 2021-01-25 NOTE — Patient Instructions (Signed)
Implanted Port Insertion, Care After This sheet gives you information about how to care for yourself after your procedure. Your health care provider may also give you more specific instructions. If you have problems or questions, contact your health care provider. What can I expect after the procedure? After the procedure, it is common to have:  Discomfort at the port insertion site.  Bruising on the skin over the port. This should improve over 3-4 days. Follow these instructions at home: Port care  After your port is placed, you will get a manufacturer's information card. The card has information about your port. Keep this card with you at all times.  Take care of the port as told by your health care provider. Ask your health care provider if you or a family member can get training for taking care of the port at home. A home health care nurse may also take care of the port.  Make sure to remember what type of port you have. Incision care  Follow instructions from your health care provider about how to take care of your port insertion site. Make sure you: ? Wash your hands with soap and water before and after you change your bandage (dressing). If soap and water are not available, use hand sanitizer. ? Change your dressing as told by your health care provider. ? Leave stitches (sutures), skin glue, or adhesive strips in place. These skin closures may need to stay in place for 2 weeks or longer. If adhesive strip edges start to loosen and curl up, you may trim the loose edges. Do not remove adhesive strips completely unless your health care provider tells you to do that.  Check your port insertion site every day for signs of infection. Check for: ? Redness, swelling, or pain. ? Fluid or blood. ? Warmth. ? Pus or a bad smell.      Activity  Return to your normal activities as told by your health care provider. Ask your health care provider what activities are safe for you.  Do not  lift anything that is heavier than 10 lb (4.5 kg), or the limit that you are told, until your health care provider says that it is safe. General instructions  Take over-the-counter and prescription medicines only as told by your health care provider.  Do not take baths, swim, or use a hot tub until your health care provider approves. Ask your health care provider if you may take showers. You may only be allowed to take sponge baths.  Do not drive for 24 hours if you were given a sedative during your procedure.  Wear a medical alert bracelet in case of an emergency. This will tell any health care providers that you have a port.  Keep all follow-up visits as told by your health care provider. This is important. Contact a health care provider if:  You cannot flush your port with saline as directed, or you cannot draw blood from the port.  You have a fever or chills.  You have redness, swelling, or pain around your port insertion site.  You have fluid or blood coming from your port insertion site.  Your port insertion site feels warm to the touch.  You have pus or a bad smell coming from the port insertion site. Get help right away if:  You have chest pain or shortness of breath.  You have bleeding from your port that you cannot control. Summary  Take care of the port as told by your   health care provider. Keep the manufacturer's information card with you at all times.  Change your dressing as told by your health care provider.  Contact a health care provider if you have a fever or chills or if you have redness, swelling, or pain around your port insertion site.  Keep all follow-up visits as told by your health care provider. This information is not intended to replace advice given to you by your health care provider. Make sure you discuss any questions you have with your health care provider. Document Revised: 05/21/2018 Document Reviewed: 05/21/2018 Elsevier Patient Education   2021 Elsevier Inc.  

## 2021-01-26 ENCOUNTER — Ambulatory Visit (INDEPENDENT_AMBULATORY_CARE_PROVIDER_SITE_OTHER): Payer: 59 | Admitting: Internal Medicine

## 2021-01-26 ENCOUNTER — Telehealth: Payer: Self-pay | Admitting: Oncology

## 2021-01-26 ENCOUNTER — Encounter: Payer: Self-pay | Admitting: Internal Medicine

## 2021-01-26 VITALS — BP 124/60 | HR 90 | Ht 63.0 in | Wt 182.4 lb

## 2021-01-26 DIAGNOSIS — E785 Hyperlipidemia, unspecified: Secondary | ICD-10-CM | POA: Diagnosis not present

## 2021-01-26 DIAGNOSIS — I2510 Atherosclerotic heart disease of native coronary artery without angina pectoris: Secondary | ICD-10-CM | POA: Diagnosis not present

## 2021-01-26 DIAGNOSIS — R0602 Shortness of breath: Secondary | ICD-10-CM

## 2021-01-26 DIAGNOSIS — C50411 Malignant neoplasm of upper-outer quadrant of right female breast: Secondary | ICD-10-CM

## 2021-01-26 DIAGNOSIS — Z170 Estrogen receptor positive status [ER+]: Secondary | ICD-10-CM

## 2021-01-26 NOTE — Patient Instructions (Addendum)
Medication Instructions:  Your physician recommends that you continue on your current medications as directed. Please refer to the Current Medication list given to you today.  *If you need a refill on your cardiac medications before your next appointment, please call your pharmacy*  Lab Work: Your physician recommends that you return for lab work in: at your convenience for LIPID (cholesterol) panel. - You will need to be fasting. Please do not have anything to eat or drink after midnight the morning you have the lab work. You may only have water or black coffee with no cream or sugar. - Labslip provided to you to take to [place] location of your choice.  If you have labs (blood work) drawn today and your tests are completely normal, you will receive your results only by: Marland Kitchen MyChart Message (if you have MyChart) OR . A paper copy in the mail If you have any lab test that is abnormal or we need to change your treatment, we will call you to review the results.   Testing/Procedures: Your physician has requested that you have an LIMITED echocardiogram. Echocardiography is a painless test that uses sound waves to create images of your heart. It provides your doctor with information about the size and shape of your heart and how well your heart's chambers and valves are working. This procedure takes approximately one hour. There are no restrictions for this procedure. There is a possibility that an IV may need to be started during your test to inject an image enhancing agent. This is done to obtain more optimal pictures of your heart. Therefore we ask that you do at least drink some water prior to coming in to hydrate your veins.   Follow-Up: At [facility], you and your health needs are our priority.  As part of our continuing mission to provide you with exceptional heart care, we have created designated Provider Care Teams.  These Care Teams include your primary Cardiologist (physician) and  Advanced Practice Providers (APPs -  Physician Assistants and Nurse Practitioners) who all work together to provide you with the care you need, when you need it.  We recommend signing up for the patient portal called "MyChart".  Sign up information is provided on this After Visit Summary.  MyChart is used to connect with patients for Virtual Visits (Telemedicine).  Patients are able to view lab/test results, encounter notes, upcoming appointments, etc.  Non-urgent messages can be sent to your provider as well.   To learn more about what you can do with MyChart, go to [email or web address].    Your next appointment:   6 week(s)  The format for your next appointment:   In Person  Provider:   You may see Nelva Bush, MD or one of the following Advanced Practice Providers on your designated Care Team:    Murray Hodgkins, NP  Christell Faith, PA-C  Marrianne Mood, PA-C  Cadence [place], [place]  Laurann [place], NP

## 2021-01-26 NOTE — Progress Notes (Signed)
Follow-up Outpatient Visit Date: 01/26/2021  Primary Care Provider: Pcp, No No address on file  Chief Complaint: Follow-up coronary artery disease  HPI:  Cassandra Cox is a 60 y.o. female with history of coronary artery disease status post anterior STEMI (11/2016), hyperlipidemia, breast cancer, and anxiety, who presents for follow-up of artery disease.  She was last seen in our office in 07/2020 for preoperative risk assessment in anticipation of breast biopsy.  She noted mild exertional dyspnea but no chest pain.  No further testing or intervention was recommended.  She was ultimately diagnosed with invasive ductal carcinoma of the right breast treated with lumpectomy and ongoing adjuvant chemotherapy to be followed by radiation.  Today, Cassandra Cox reports that she is doing relatively well.  She has some exertional dyspnea and fatigue that began with chemotherapy.  She also had fairly intense chest discomfort after receiving her first dose of colony-stimulating factor after chemotherapy.  She contacted the cancer center and was told this was a normal reaction.  She has subsequently been treated with analgesics and IV fluids around colony-stimulating factor therapy without further chest pain.  She denies orthopnea, PND, edema, palpitations, and lightheadedness.  --------------------------------------------------------------------------------------------------  Cardiovascular History & Procedures: Cardiovascular Problems:  Coronary artery disease status post anterior STEMI (11/2016)  Risk Factors:  Known CAD and hyperlipidemia  Cath/PCI:  LHC/PCI (12/02/16): LMCA normal. LAD 30% ostial and 99% mid vessel disease. LCx and RCA without significant disease. Successful IVUS-guided PCI to mid LAD with placement of Integrity Resolute 3.0 x 18 mm drug-eluting stent postdilated with 3.75 mm Falun balloon.  CV Surgery:  None  EP Procedures and Devices:  None  Non-Invasive  Evaluation(s):  TTE (06/23/2020): Normal LV size and wall thickness.  LVEF 60-65% with grade 2 diastolic dysfunction.  Normal RV size and function.  No significant valvular abnormality.  TTE (09/14/2017): Normal LV size. LVEF 55-60% with normal wall motion. Grade 2 diastolic dysfunction with elevated filling pressure. Normal RV size and function. No significant valvular abnormality.  Pharmacologic MPI (09/14/2017): Normal study without ischemia or scar. LVEF 72%.  TTE (12/03/16): Normal LV size with mild LVH. LVEF 55-60% with grade 2 diastolic dysfunction. Left atrial enlargement. Normal RV size and function. No significant valvular abnormalities.  Recent CV Pertinent Labs: Lab Results  Component Value Date   CHOL 146 03/03/2020   HDL 59 03/03/2020   LDLCALC 67 03/03/2020   LDLDIRECT 142.2 12/04/2013   TRIG 112 03/03/2020   CHOLHDL 2.5 03/03/2020   CHOLHDL 2.3 09/14/2017   INR 1.03 09/14/2017   K 3.9 01/25/2021   MG 1.7 12/04/2016   BUN 15 01/25/2021   BUN 18 03/03/2020   CREATININE 0.72 01/25/2021    Past medical and surgical history were reviewed and updated in EPIC.  Current Meds  Medication Sig  . ALPRAZolam (XANAX) 0.25 MG tablet Take 0.25 mg by mouth 3 (three) times daily as needed for anxiety.  Francia Greaves THYROID 60 MG tablet Take 60 mg by mouth daily.  Marland Kitchen aspirin EC 81 MG tablet Take 81 mg by mouth daily.  Marland Kitchen atorvastatin (LIPITOR) 80 MG tablet TAKE 1 TABLET BY MOUTH DAILY AT 6PM GENERIC EQUIVALENT FOR LIPITOR  . buPROPion (WELLBUTRIN XL) 150 MG 24 hr tablet Take 300 mg by mouth daily.   . Cholecalciferol (VITAMIN D-3) 5000 UNITS TABS Take 1 tablet by mouth daily.  . clopidogrel (PLAVIX) 75 MG tablet TAKE 1 TABLET BY MOUTH DAILY GENERIC EQUIVALENT FOR PLAVIX  . Coenzyme Q10 (COQ10) 100 MG  CAPS Take 100 mg by mouth daily.  . Cyanocobalamin 1000 MCG/ML KIT Inject 1,000 mcg as directed once a week.  Marland Kitchen dexamethasone (DECADRON) 4 MG tablet Take 1 tablet (4 mg total) by mouth  2 (two) times daily. Start the day before Taxotere. Then 1 tab daily the day after chemo for 2 days.  Marland Kitchen ezetimibe (ZETIA) 10 MG tablet Take 1 tablet (10 mg total) by mouth daily.  Marland Kitchen FLUoxetine (PROZAC) 20 MG capsule Take 20 mg by mouth daily.   Marland Kitchen HYDROcodone-acetaminophen (NORCO/VICODIN) 5-325 MG tablet Take 1-2 tablets by mouth every 6 (six) hours as needed for moderate pain or severe pain.  Marland Kitchen ketoconazole (NIZORAL) 2 % cream Apply 1 application topically daily.  Marland Kitchen levocetirizine (XYZAL) 5 MG tablet Take 1 tablet at bedtime by mouth.  . lidocaine-prilocaine (EMLA) cream Apply to affected area once  . liothyronine (CYTOMEL) 5 MCG tablet Take 5 mcg by mouth every morning.   . magnesium gluconate (MAGONATE) 500 MG tablet Take 1,000 mg by mouth at bedtime.  . metFORMIN (GLUCOPHAGE-XR) 500 MG 24 hr tablet Take 500 mg by mouth 2 (two) times daily.  . montelukast (SINGULAIR) 10 MG tablet Take 10 mg by mouth every morning.  Marland Kitchen NALTREXONE HCL PO Take 4 mg by mouth at bedtime.   . nitroGLYCERIN (NITROSTAT) 0.4 MG SL tablet Place 1 tablet (0.4 mg total) under the tongue every 5 (five) minutes as needed for chest pain. For maximum of 3 doses.  Marland Kitchen ondansetron (ZOFRAN) 8 MG tablet Take 1 tablet (8 mg total) by mouth 2 (two) times daily as needed for refractory nausea / vomiting. Start on day 3 after chemo.  . prochlorperazine (COMPAZINE) 10 MG tablet Take 1 tablet (10 mg total) by mouth every 6 (six) hours as needed (Nausea or vomiting).  . valACYclovir (VALTREX) 500 MG tablet Take 1 tablet (500 mg total) by mouth daily.  . vitamin C (ASCORBIC ACID) 500 MG tablet Take 500 mg by mouth daily.  Marland Kitchen zinc gluconate 50 MG tablet Take 50 mg by mouth daily.  Marland Kitchen zolpidem (AMBIEN CR) 12.5 MG CR tablet Take 12.5 mg by mouth at bedtime as needed for sleep.    Allergies: Prednisone, Sulfa antibiotics, Sulfasalazine, and Sulfonamide derivatives  Social History   Tobacco Use  . Smoking status: Never Smoker  . Smokeless  tobacco: Never Used  Vaping Use  . Vaping Use: Never used  Substance Use Topics  . Alcohol use: No  . Drug use: No    Family History  Problem Relation Age of Onset  . Heart disease Mother   . Diabetes Mother   . Stroke Mother   . Heart attack Mother   . Atrial fibrillation Mother   . Hypertension Mother   . Hyperlipidemia Mother   . COPD Father   . Hypertension Father   . Hyperlipidemia Father   . Breast cancer Maternal Aunt 69       x2 age 54  . Diabetes Maternal Grandfather     Review of Systems: A 12-system review of systems was performed and was negative except as noted in the HPI.  --------------------------------------------------------------------------------------------------  Physical Exam: BP 124/60 (BP Location: Left Arm, Patient Position: Sitting, Cuff Size: Normal)   Pulse 90   Ht 5' 3" (1.6 m)   Wt 182 lb 6 oz (82.7 kg)   SpO2 98%   BMI 32.31 kg/m   General:  NAD. Neck: No JVD or HJR. Lungs: Clear to auscultation bilaterally without wheezes or crackles.  Heart: Regular rate and rhythm without murmurs, rubs, or gallops. Abdomen: Soft, nontender, nondistended. Extremities: No lower extremity edema.  EKG: Normal sinus rhythm with nonspecific ST/T changes.  Lab Results  Component Value Date   WBC 5.0 01/25/2021   HGB 10.6 (L) 01/25/2021   HCT 32.5 (L) 01/25/2021   MCV 92.6 01/25/2021   PLT 344 01/25/2021    Lab Results  Component Value Date   NA 140 01/25/2021   K 3.9 01/25/2021   CL 106 01/25/2021   CO2 24 01/25/2021   BUN 15 01/25/2021   CREATININE 0.72 01/25/2021   GLUCOSE 160 (H) 01/25/2021   ALT 50 (H) 01/25/2021    Lab Results  Component Value Date   CHOL 146 03/03/2020   HDL 59 03/03/2020   LDLCALC 67 03/03/2020   LDLDIRECT 142.2 12/04/2013   TRIG 112 03/03/2020   CHOLHDL 2.5 03/03/2020    --------------------------------------------------------------------------------------------------  ASSESSMENT AND PLAN: Coronary  artery disease and shortness of breath: Cassandra Cox has been doing fairly well from a heart standpoint though she experienced an episode of intense chest pain after receiving colony-stimulating factor following her first round of chemotherapy.  This has not recurred in the setting of treatment with analgesics and IV fluids.  She has not had any exertional chest pain but notes some DOE since beginning chemotherapy.  This is not entirely unexpected.  She appears euvolemic on exam today.  Her EKG shows stable nonspecific ST/T changes.  I have recommended that we repeat a limited echo to reassess her LVEF, which was normal last year.  Based on results and symptoms, we will need to consider myocardial perfusion stress testing in the future, though if possible, I would like to defer this until she has completed her chemotherapy.  We will defer medication changes today.  Hyperlipidemia: Lipids well controlled on last check about a year ago.  We will obtain a fasting lipid panel at Cassandra Cox's convenience.  Continue atorvastatin 80 mg daily and ezetimibe 10 mg daily for now.  Breast cancer: Cassandra Cox is status post lumpectomy and is currently undergoing adjuvant chemotherapy to be followed by XRT.  Continue follow-up with Dr. Jana Hakim.  Follow-up: Return to clinic in 6 weeks.  Nelva Bush, MD 01/26/2021 4:49 PM

## 2021-01-26 NOTE — Telephone Encounter (Signed)
Scheduled appts per 3/22 los. Called pt, no answer. Left msg with appts dates and times.  

## 2021-01-27 ENCOUNTER — Ambulatory Visit: Payer: 59

## 2021-01-27 ENCOUNTER — Other Ambulatory Visit: Payer: Self-pay | Admitting: Internal Medicine

## 2021-01-27 ENCOUNTER — Encounter: Payer: Self-pay | Admitting: Internal Medicine

## 2021-01-27 ENCOUNTER — Inpatient Hospital Stay (HOSPITAL_BASED_OUTPATIENT_CLINIC_OR_DEPARTMENT_OTHER): Payer: 59 | Admitting: Medical

## 2021-01-27 ENCOUNTER — Other Ambulatory Visit: Payer: Self-pay

## 2021-01-27 VITALS — BP 124/70 | HR 95 | Temp 98.6°F | Resp 17 | Ht 63.0 in | Wt 180.3 lb

## 2021-01-27 DIAGNOSIS — C50411 Malignant neoplasm of upper-outer quadrant of right female breast: Secondary | ICD-10-CM

## 2021-01-27 DIAGNOSIS — Z170 Estrogen receptor positive status [ER+]: Secondary | ICD-10-CM

## 2021-01-27 DIAGNOSIS — Z95828 Presence of other vascular implants and grafts: Secondary | ICD-10-CM

## 2021-01-27 DIAGNOSIS — Z5111 Encounter for antineoplastic chemotherapy: Secondary | ICD-10-CM | POA: Diagnosis not present

## 2021-01-27 MED ORDER — PEGFILGRASTIM-BMEZ 6 MG/0.6ML ~~LOC~~ SOSY
PREFILLED_SYRINGE | SUBCUTANEOUS | Status: AC
  Filled 2021-01-27: qty 0.6

## 2021-01-27 MED ORDER — PEGFILGRASTIM-BMEZ 6 MG/0.6ML ~~LOC~~ SOSY
6.0000 mg | PREFILLED_SYRINGE | Freq: Once | SUBCUTANEOUS | Status: AC
  Administered 2021-01-27: 6 mg via SUBCUTANEOUS

## 2021-01-27 MED ORDER — SODIUM CHLORIDE 0.9 % IV SOLN
INTRAVENOUS | Status: DC
  Filled 2021-01-27 (×2): qty 250

## 2021-01-27 NOTE — Patient Instructions (Signed)
Pegfilgrastim injection What is this medicine? PEGFILGRASTIM (PEG fil gra stim) is a long-acting granulocyte colony-stimulating factor that stimulates the growth of neutrophils, a type of white blood cell important in the body's fight against infection. It is used to reduce the incidence of fever and infection in patients with certain types of cancer who are receiving chemotherapy that affects the bone marrow, and to increase survival after being exposed to high doses of radiation. This medicine may be used for other purposes; ask your health care provider or pharmacist if you have questions. COMMON BRAND NAME(S): Rexene Edison, Ziextenzo What should I tell my health care provider before I take this medicine? They need to know if you have any of these conditions:  kidney disease  latex allergy  ongoing radiation therapy  sickle cell disease  skin reactions to acrylic adhesives (On-Body Injector only)  an unusual or allergic reaction to pegfilgrastim, filgrastim, other medicines, foods, dyes, or preservatives  pregnant or trying to get pregnant  breast-feeding How should I use this medicine? This medicine is for injection under the skin. If you get this medicine at home, you will be taught how to prepare and give the pre-filled syringe or how to use the On-body Injector. Refer to the patient Instructions for Use for detailed instructions. Use exactly as directed. Tell your healthcare provider immediately if you suspect that the On-body Injector may not have performed as intended or if you suspect the use of the On-body Injector resulted in a missed or partial dose. It is important that you put your used needles and syringes in a special sharps container. Do not put them in a trash can. If you do not have a sharps container, call your pharmacist or healthcare provider to get one. Talk to your pediatrician regarding the use of this medicine in children. While this drug  may be prescribed for selected conditions, precautions do apply. Overdosage: If you think you have taken too much of this medicine contact a poison control center or emergency room at once. NOTE: This medicine is only for you. Do not share this medicine with others. What if I miss a dose? It is important not to miss your dose. Call your doctor or health care professional if you miss your dose. If you miss a dose due to an On-body Injector failure or leakage, a new dose should be administered as soon as possible using a single prefilled syringe for manual use. What may interact with this medicine? Interactions have not been studied. This list may not describe all possible interactions. Give your health care provider a list of all the medicines, herbs, non-prescription drugs, or dietary supplements you use. Also tell them if you smoke, drink alcohol, or use illegal drugs. Some items may interact with your medicine. What should I watch for while using this medicine? Your condition will be monitored carefully while you are receiving this medicine. You may need blood work done while you are taking this medicine. Talk to your health care provider about your risk of cancer. You may be more at risk for certain types of cancer if you take this medicine. If you are going to need a MRI, CT scan, or other procedure, tell your doctor that you are using this medicine (On-Body Injector only). What side effects may I notice from receiving this medicine? Side effects that you should report to your doctor or health care professional as soon as possible:  allergic reactions (skin rash, itching or hives, swelling of  the face, lips, or tongue)  back pain  dizziness  fever  pain, redness, or irritation at site where injected  pinpoint red spots on the skin  red or dark-brown urine  shortness of breath or breathing problems  stomach or side pain, or pain at the shoulder  swelling  tiredness  trouble  passing urine or change in the amount of urine  unusual bruising or bleeding Side effects that usually do not require medical attention (report to your doctor or health care professional if they continue or are bothersome):  bone pain  muscle pain This list may not describe all possible side effects. Call your doctor for medical advice about side effects. You may report side effects to FDA at 1-800-FDA-1088. Where should I keep my medicine? Keep out of the reach of children. If you are using this medicine at home, you will be instructed on how to store it. Throw away any unused medicine after the expiration date on the label. NOTE: This sheet is a summary. It may not cover all possible information. If you have questions about this medicine, talk to your doctor, pharmacist, or health care provider.  2021 Elsevier/Gold Standard (2019-11-14 13:20:51) Rehydration, Adult Rehydration is the replacement of body fluids, salts, and minerals (electrolytes) that are lost during dehydration. Dehydration is when there is not enough water or other fluids in the body. This happens when you lose more fluids than you take in. Common causes of dehydration include:  Not drinking enough fluids. This can occur when you are ill or doing activities that require a lot of energy, especially in hot weather.  Conditions that cause loss of water or other fluids, such as diarrhea, vomiting, sweating, or urinating a lot.  Other illnesses, such as fever or infection.  Certain medicines, such as those that remove excess fluid from the body (diuretics). Symptoms of mild or moderate dehydration may include thirst, dry lips and mouth, and dizziness. Symptoms of severe dehydration may include increased heart rate, confusion, fainting, and not urinating. For severe dehydration, you may need to get fluids through an IV at the hospital. For mild or moderate dehydration, you can usually rehydrate at home by drinking certain  fluids as told by your health care provider. What are the risks? Generally, rehydration is safe. However, taking in too much fluid (overhydration) can be a problem. This is rare. Overhydration can cause an electrolyte imbalance, kidney failure, or a decrease in salt (sodium) levels in the body. Supplies needed You will need an oral rehydration solution (ORS) if your health care provider tells you to use one. This is a drink to treat dehydration. It can be found in pharmacies and retail stores. How to rehydrate Fluids Follow instructions from your health care provider for rehydration. The kind of fluid and the amount you should drink depend on your condition. In general, you should choose drinks that you prefer.  If told by your health care provider, drink an ORS. ? Make an ORS by following instructions on the package. ? Start by drinking small amounts, about  cup (120 mL) every 5-10 minutes. ? Slowly increase how much you drink until you have taken the amount recommended by your health care provider.  Drink enough clear fluids to keep your urine pale yellow. If you were told to drink an ORS, finish it first, then start slowly drinking other clear fluids. Drink fluids such as: ? Water. This includes sparkling water and flavored water. Drinking only water can lead to having  too little sodium in your body (hyponatremia). Follow the advice of your health care provider. ? Water from ice chips you suck on. ? Fruit juice with water you add to it (diluted). ? Sports drinks. ? Hot or cold herbal teas. ? Broth-based soups. ? Milk or milk products. Food Follow instructions from your health care provider about what to eat while you rehydrate. Your health care provider may recommend that you slowly begin eating regular foods in small amounts.  Eat foods that contain a healthy balance of electrolytes, such as bananas, oranges, potatoes, tomatoes, and spinach.  Avoid foods that are greasy or contain a  lot of sugar. In some cases, you may get nutrition through a feeding tube that is passed through your nose and into your stomach (nasogastric tube, or NG tube). This may be done if you have uncontrolled vomiting or diarrhea.   Beverages to avoid Certain beverages may make dehydration worse. While you rehydrate, avoid drinking alcohol.   How to tell if you are recovering from dehydration You may be recovering from dehydration if:  You are urinating more often than before you started rehydrating.  Your urine is pale yellow.  Your energy level improves.  You vomit less frequently.  You have diarrhea less frequently.  Your appetite improves or returns to normal.  You feel less dizzy or less light-headed.  Your skin tone and color start to look more normal. Follow these instructions at home:  Take over-the-counter and prescription medicines only as told by your health care provider.  Do not take sodium tablets. Doing this can lead to having too much sodium in your body (hypernatremia). Contact a health care provider if:  You continue to have symptoms of mild or moderate dehydration, such as: ? Thirst. ? Dry lips. ? Slightly dry mouth. ? Dizziness. ? Dark urine or less urine than normal. ? Muscle cramps.  You continue to vomit or have diarrhea. Get help right away if you:  Have symptoms of dehydration that get worse.  Have a fever.  Have a severe headache.  Have been vomiting and the following happens: ? Your vomiting gets worse or does not go away. ? Your vomit includes blood or green matter (bile). ? You cannot eat or drink without vomiting.  Have problems with urination or bowel movements, such as: ? Diarrhea that gets worse or does not go away. ? Blood in your stool (feces). This may cause stool to look black and tarry. ? Not urinating, or urinating only a small amount of very dark urine, within 6-8 hours.  Have trouble breathing.  Have symptoms that get worse  with treatment. These symptoms may represent a serious problem that is an emergency. Do not wait to see if the symptoms will go away. Get medical help right away. Call your local emergency services (911 in the [place] Do not drive yourself to the hospital. Summary  Rehydration is the replacement of body fluids and minerals (electrolytes) that are lost during dehydration.  Follow instructions from your health care provider for rehydration. The kind of fluid and amount you should drink depend on your condition.  Slowly increase how much you drink until you have taken the amount recommended by your health care provider.  Contact your health care provider if you continue to show signs of mild or moderate dehydration. This information is not intended to replace advice given to you by your health care provider. Make sure you discuss any questions you have with your health care  provider. Document Revised: 12/24/2019 Document Reviewed: 11/03/2019 Elsevier Patient Education  2021 [employer].

## 2021-01-28 ENCOUNTER — Inpatient Hospital Stay
Admission: RE | Admit: 2021-01-28 | Discharge: 2021-01-28 | Disposition: A | Discharge status: Home / Self Care | Payer: Self-pay | Source: Ambulatory Visit | Attending: Radiation Oncology | Admitting: Radiation Oncology

## 2021-01-28 ENCOUNTER — Other Ambulatory Visit: Payer: Self-pay | Admitting: Radiation Oncology

## 2021-01-28 DIAGNOSIS — C50411 Malignant neoplasm of upper-outer quadrant of right female breast: Secondary | ICD-10-CM

## 2021-01-28 DIAGNOSIS — Z170 Estrogen receptor positive status [ER+]: Secondary | ICD-10-CM

## 2021-01-28 LAB — LIPID PANEL W/O CHOL/HDL RATIO
Cholesterol, Total: 153 mg/dL (ref 100–199)
HDL: 63 mg/dL (ref >39)
LDL Chol Calc (NIH): 76 mg/dL (ref 0–99)
Triglycerides: 71 mg/dL (ref 0–149)
VLDL Cholesterol Cal: 14 mg/dL (ref 5–40)

## 2021-01-29 ENCOUNTER — Inpatient Hospital Stay: Payer: 59

## 2021-01-29 ENCOUNTER — Other Ambulatory Visit: Payer: Self-pay

## 2021-01-29 VITALS — BP 115/67 | HR 92 | Temp 98.7°F | Resp 17

## 2021-01-29 DIAGNOSIS — Z5111 Encounter for antineoplastic chemotherapy: Secondary | ICD-10-CM | POA: Diagnosis not present

## 2021-01-29 DIAGNOSIS — Z95828 Presence of other vascular implants and grafts: Secondary | ICD-10-CM

## 2021-01-29 MED ORDER — SODIUM CHLORIDE 0.9% FLUSH
10.0000 mL | Freq: Once | INTRAVENOUS | Status: AC
  Administered 2021-01-29: 10 mL
  Filled 2021-01-29: qty 10

## 2021-01-29 MED ORDER — SODIUM CHLORIDE 0.9 % IV SOLN
INTRAVENOUS | Status: DC
  Filled 2021-01-29 (×2): qty 250

## 2021-01-29 MED ORDER — HEPARIN SOD (PORK) LOCK FLUSH 100 UNIT/ML IV SOLN
500.0000 [IU] | Freq: Once | INTRAVENOUS | Status: AC
  Administered 2021-01-29: 500 [IU]
  Filled 2021-01-29: qty 5

## 2021-02-01 ENCOUNTER — Other Ambulatory Visit: Payer: 59

## 2021-02-01 ENCOUNTER — Ambulatory Visit: Payer: 59 | Admitting: Oncology

## 2021-02-01 ENCOUNTER — Ambulatory Visit: Payer: 59

## 2021-02-08 ENCOUNTER — Ambulatory Visit: Payer: 59

## 2021-02-08 ENCOUNTER — Other Ambulatory Visit: Payer: 59

## 2021-02-14 ENCOUNTER — Other Ambulatory Visit: Payer: Self-pay

## 2021-02-14 DIAGNOSIS — Z170 Estrogen receptor positive status [ER+]: Secondary | ICD-10-CM

## 2021-02-14 DIAGNOSIS — C50411 Malignant neoplasm of upper-outer quadrant of right female breast: Secondary | ICD-10-CM

## 2021-02-14 NOTE — Progress Notes (Signed)
[place]  [phone] [phone]     ID: ARAYA ROEL DOB: 13-Feb-1961  MR#: [id number]  HCW#:[id number]  Patient Care Team: Pcp, No as PCP - General End, Harrell Gave, MD as PCP - Cardiology (Cardiology) Mauro Kaufmann, RN as Oncology Nurse Navigator Rockwell Germany, RN as Oncology Nurse Navigator Jovita Kussmaul, MD as Consulting Physician (General Surgery) Truitt Merle, MD as Consulting Physician (Hematology) Kyung Rudd, MD as Consulting Physician (Radiation Oncology) Chauncey Cruel, MD OTHER MD:  CHIEF COMPLAINT: estrogen receptor positive breast cancer  CURRENT TREATMENT: Completing adjuvant chemotherapy    INTERVAL HISTORY: Cassandra Cox returns today for follow up and treatment of her estrogen receptor positive breast cancer.  She began adjuvant chemotherapy, consisting of docetaxel and cyclophosphamide every 21 days x4, on 12/14/2020. Today is day 1 cycle 4 of 4 planned.   REVIEW OF SYSTEMS: Deannah has done remarkably well with her treatment.  She was able to keep her here with the use of one of the ice caps.  She has never developed any peripheral neuropathy.  She does have discomfort in the balls of both feet but that was present before treatment started and has not progressed.  She agrees that she has done "very well" overall.  For exercising she walks at least 3 times a week outside the house.  She does complain of slight constipation which she treats with evening magnesium.  Detailed review of systems today was otherwise stable   COVID 19 VACCINATION STATUS: Refuses vaccination   HISTORY OF CURRENT ILLNESS: From my original evaluation note:  Cassandra Cox had routine screening mammography on 06/30/2020 showing a possible abnormality in the right breast. She underwent right diagnostic mammography with tomography and right breast ultrasonography at [facility] on 07/20/2020 showing: breast density category C; 1.1 cm irregular mass in right  breast at 10-11 o'clock, better seen on mammogram; no suspicious right axillary lymph nodes.  Accordingly on 08/09/2020 she proceeded to biopsy of the right breast area in question. The pathology from this procedure  showed: invasive and in situ mammary carcinoma, e-cadherin positive, grade 3. Prognostic indicators significant for: estrogen receptor, 95% positive and progesterone receptor, 90% positive. Proliferation marker Ki67 at 30%. HER2 negative by immunohistochemistry.  She underwent right lumpectomy on 10/04/2020 under Dr. Marlou Starks. Pathology from the procedure [id number]) showed: invasive ductal carcinoma, grade 2, 1.2 cm; ductal carcinoma in situ, intermediate grade; calcifications associated with carcinoma; margins uninvolved; lymphovascular space invasion present.  Out of three biopsied lymph nodes, one revealed metastatic carcinoma (1/3). The positive lymph node showed a disrupted capsule that is suspicious for extracapsular extension.  Mammaprint was performed on the final surgical sample and revealed high risk.  The patient's subsequent history is as detailed below.   PAST MEDICAL HISTORY: Past Medical History:  Diagnosis Date  . Anxiety   . Complication of anesthesia   . Coronary artery disease    a. 11/2016 Cath/PCI: LM nl, LAD 30ost, 3m(3.0x18 Resolute Integrity DES--3.75), LCX nl, RCA nl; b. MV 11/18: negative for ischemia, EF 72%, low risk  . Depression   . Diabetes mellitus without complication ([place]    type 2  . Diastolic dysfunction    a. 11/2016 Echo: EF 55-60%, Gr2 DD, LAE, nl RV size/fxn.  . Family history of breast cancer   . History of kidney stones   . Hyperlipemia \\[id number]\ . Hypothyroidism   . Kidney stone 08/2016  . Mitral valve prolapse   . MVP (mitral valve  prolapse)   . Neuromuscular disorder ([place])    bilateral feet  . PONV (postoperative nausea and vomiting)   . STEMI (ST elevation myocardial infarction) ([place]) 12/02/2016  . Vitamin B 12  deficiency     PAST SURGICAL HISTORY: Past Surgical History:  Procedure Laterality Date  . ABDOMINAL HYSTERECTOMY  2003   TAH due to adenomyosis  . BREAST LUMPECTOMY WITH RADIOACTIVE SEED AND SENTINEL LYMPH NODE BIOPSY Right 10/04/2020   Procedure: RIGHT BREAST REDUCTION LUMPECTOMY WITH RADIOACTIVE SEED AND SENTINEL LYMPH NODE BIOPSY;  Surgeon: Jovita Kussmaul, MD;  Location: [facility];  Service: General;  Laterality: Right;  . BREAST REDUCTION SURGERY  1990  . BREAST REDUCTION SURGERY Bilateral 10/04/2020   Procedure: BILATERAL MAMMARY REDUCTION  (BREAST);  Surgeon: Wallace Going, DO;  Location: [place] [facility];  Service: Plastics;  Laterality: Bilateral;  . CARDIAC CATHETERIZATION N/A 12/02/2016   Procedure: Coronary Stent Intervention;  Surgeon: Nelva Bush, MD;  Location: [place] [facility];  Service: Cardiovascular;  Laterality: N/A;  . CARDIAC CATHETERIZATION N/A 12/02/2016   Procedure: Left Heart Cath and Coronary Angiography;  Surgeon: Nelva Bush, MD;  Location: [facility];  Service: Cardiovascular;  Laterality: N/A;  . CARDIAC CATHETERIZATION N/A 12/02/2016   Procedure: Intravascular Ultrasound/IVUS;  Surgeon: Nelva Bush, MD;  Location: [facility];  Service: Cardiovascular;  Laterality: N/A;  . CORONARY STENT INTERVENTION    . MOUTH SURGERY     Dental implant  . PORTACATH PLACEMENT Left 12/13/2020   Procedure: INSERTION PORT-A-CATH LEFT SUBCLAVIAN;  Surgeon: Jovita Kussmaul, MD;  Location: [facility];  Service: General;  Laterality: Left;    FAMILY HISTORY: Family History  Problem Relation Age of Onset  . Heart disease Mother   . Diabetes Mother   . Stroke Mother   . Heart attack Mother   . Atrial fibrillation Mother   . Hypertension Mother   . Hyperlipidemia Mother   . COPD Father   . Hypertension Father   . Hyperlipidemia Father   . Breast cancer Maternal Aunt 28       x2 age 67  .  Diabetes Maternal Grandfather    Her father died at age 31 and her mother at age 14. [name] is an only child. She reports breast cancer in a maternal aunt two separate times, in her 33's and then in her 85's.   GYNECOLOGIC HISTORY:  No LMP recorded. Patient has had a hysterectomy. Menarche: 60 years old Age at first live birth: 60 years old [place] P 2 LMP 07/1997 (having periods at time of hysterectomy) Contraceptive: used oral contraceptives for 17 years without complications ([id number]) HRT used progesterone for 2-3 years, stopped with cancer diagnosis Hysterectomy? Yes, 1998 for adenomyosis BSO? 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 She lives by herself with he dachshund. Daughter Carmell [place] is 68, lives in [place], and is a homemaker; she suffers from [employer]. Daughter [place] is 46, lives in [place] and works as a Psychologist, educational.  The patient has 1 grandchild.  She is a Psychologist, forensic.    ADVANCED DIRECTIVES:    HEALTH MAINTENANCE: Social History   Tobacco Use  . Smoking status: Never Smoker  . Smokeless tobacco: Never Used  Vaping Use  . Vaping Use: Never used  Substance Use Topics  . Alcohol use: No  . Drug use: No     Colonoscopy:   PAP:  Bone density:    Allergies  Allergen Reactions  . Prednisone Other (See Comments)    REACTION: Swelling REACTION: Swelling Facial swelling REACTION: Swelling  . Sulfa Antibiotics Hives and Other (See Comments)    REACTION: Hives  . Sulfasalazine     REACTION: Hives  . Sulfonamide Derivatives     REACTION: Hives    Current Outpatient Medications  Medication Sig Dispense Refill  . ALPRAZolam (XANAX) 0.25 MG tablet Take 0.25 mg by mouth 3 (three) times daily as needed for anxiety.    Francia Greaves THYROID 60 MG tablet Take 60 mg by mouth daily.    Marland Kitchen aspirin EC 81 MG tablet Take 81 mg by mouth daily.    Marland Kitchen atorvastatin (LIPITOR) 80 MG tablet TAKE 1 TABLET BY MOUTH  DAILY AT 6PM GENERIC EQUIVALENT FOR LIPITOR 90 tablet 0  . buPROPion (WELLBUTRIN XL) 150 MG 24 hr tablet Take 300 mg by mouth daily.     . Cholecalciferol (VITAMIN D-3) 5000 UNITS TABS Take 1 tablet by mouth daily.    . clopidogrel (PLAVIX) 75 MG tablet TAKE 1 TABLET BY MOUTH DAILY GENERIC EQUIVALENT FOR PLAVIX 90 tablet 3  . Coenzyme Q10 (COQ10) 100 MG CAPS Take 100 mg by mouth daily.    . Cyanocobalamin 1000 MCG/ML KIT Inject 1,000 mcg as directed once a week.    Marland Kitchen dexamethasone (DECADRON) 4 MG tablet Take 1 tablet (4 mg total) by mouth 2 (two) times daily. Start the day before Taxotere. Then 1 tab daily the day after chemo for 2 days. 30 tablet 1  . ezetimibe (ZETIA) 10 MG tablet Take 1 tablet (10 mg total) by mouth daily. 90 tablet 0  . FLUoxetine (PROZAC) 20 MG capsule Take 20 mg by mouth daily.     Marland Kitchen HYDROcodone-acetaminophen (NORCO/VICODIN) 5-325 MG tablet Take 1-2 tablets by mouth every 6 (six) hours as needed for moderate pain or severe pain. 10 tablet 0  . ketoconazole (NIZORAL) 2 % cream Apply 1 application topically daily. 15 g 0  . levocetirizine (XYZAL) 5 MG tablet Take 1 tablet at bedtime by mouth.  2  . lidocaine-prilocaine (EMLA) cream Apply to affected area once 30 g 3  . liothyronine (CYTOMEL) 5 MCG tablet Take 5 mcg by mouth every morning.     . magnesium gluconate (MAGONATE) 500 MG tablet Take 1,000 mg by mouth at bedtime.    . metFORMIN (GLUCOPHAGE-XR) 500 MG 24 hr tablet Take 500 mg by mouth 2 (two) times daily.    . montelukast (SINGULAIR) 10 MG tablet Take 10 mg by mouth every morning.    Marland Kitchen NALTREXONE HCL PO Take 4 mg by mouth at bedtime.     . nitroGLYCERIN (NITROSTAT) 0.4 MG SL tablet Place 1 tablet (0.4 mg total) under the tongue every 5 (five) minutes as needed for chest pain. For maximum of 3 doses. 25 tablet 3  . ondansetron (ZOFRAN) 8 MG tablet Take 1 tablet (8 mg total) by mouth 2 (two) times daily as needed for refractory nausea / vomiting. Start on day 3 after  chemo. 30 tablet 1  . prochlorperazine (COMPAZINE) 10 MG tablet Take 1 tablet (10 mg total) by mouth every 6 (six) hours as needed (Nausea or vomiting). 30 tablet 1  . valACYclovir (VALTREX) 500 MG tablet Take 1 tablet (500 mg total) by mouth daily. 90 tablet 1  . vitamin C (ASCORBIC ACID) 500 MG tablet Take 500 mg by mouth daily.    Marland Kitchen zinc gluconate 50 MG  tablet Take 50 mg by mouth daily.    Marland Kitchen zolpidem (AMBIEN CR) 12.5 MG CR tablet Take 12.5 mg by mouth at bedtime as needed for sleep.     No current facility-administered medications for this visit.   Facility-Administered Medications Ordered in Other Visits  Medication Dose Route Frequency Provider Last Rate Last Admin  . sodium chloride flush (NS) 0.9 % injection 10 mL  10 mL Intracatheter PRN Iam Lipson, Virgie Dad, MD   10 mL at 02/15/21 1506    OBJECTIVE: White woman examined in the infusion area  There were no vitals filed for this visit.   There is no height or weight on file to calculate BMI.   Wt Readings from Last 3 Encounters:  02/15/21 183 lb (83 kg)  01/27/21 180 lb 4.8 oz (81.8 kg)  01/26/21 182 lb 6 oz (82.7 kg)   For vitals associated with the 02/15/2021 visit please see the infusion area flowsheet    ECOG FS:1 - Symptomatic but completely ambulatory  Has kept her hair very nicely Sclerae unicteric, EOMs intact Wearing a mask No cervical or supraclavicular adenopathy Lungs no rales or rhonchi Heart regular rate and rhythm Abd soft, nontender, positive bowel sounds MSK no focal spinal tenderness, no upper extremity lymphedema Neuro: nonfocal, well oriented, appropriate affect Breasts: Deferred   LAB RESULTS:  CMP     Component Value Date/Time   NA 137 02/15/2021 1049   NA 140 03/03/2020 1052   K 4.0 02/15/2021 1049   CL 104 02/15/2021 1049   CO2 20 (L) 02/15/2021 1049   GLUCOSE 180 (H) 02/15/2021 1049   BUN 19 02/15/2021 1049   BUN 18 03/03/2020 1052   CREATININE 0.69 02/15/2021 1049   CALCIUM 9.5  02/15/2021 1049   PROT 6.9 02/15/2021 1049   PROT 6.5 03/03/2020 1052   ALBUMIN 3.9 02/15/2021 1049   ALBUMIN 4.4 03/03/2020 1052   AST 19 02/15/2021 1049   ALT 39 02/15/2021 1049   ALKPHOS 99 02/15/2021 1049   BILITOT 0.3 02/15/2021 1049   GFRNONAA >60 02/15/2021 1049   GFRAA 111 03/03/2020 1052    No results found for: TOTALPROTELP, ALBUMINELP, A1GS, A2GS, BETS, BETA2SER, GAMS, MSPIKE, SPEI  Lab Results  Component Value Date   WBC 13.7 (H) 02/15/2021   NEUTROABS 11.9 (H) 02/15/2021   HGB 10.8 (L) 02/15/2021   HCT 32.7 (L) 02/15/2021   MCV 93.2 02/15/2021   PLT 416 (H) 02/15/2021    No results found for: LABCA2  No components found for: OFVWAQ773  No results for input(s): INR in the last 168 hours.  No results found for: LABCA2  No results found for: PVG681  No results found for: PTE707  No results found for: AJH183  No results found for: CA2729  No components found for: HGQUANT  No results found for: CEA1 / No results found for: CEA1   No results found for: AFPTUMOR  No results found for: CHROMOGRNA  No results found for: KPAFRELGTCHN, LAMBDASER, KAPLAMBRATIO (kappa/lambda light chains)  No results found for: HGBA, HGBA2QUANT, HGBFQUANT, HGBSQUAN (Hemoglobinopathy evaluation)   No results found for: LDH  No results found for: IRON, TIBC, IRONPCTSAT (Iron and TIBC)  No results found for: FERRITIN  Urinalysis    Component Value Date/Time   COLORURINE RED (A) 08/28/2016 1428   APPEARANCEUR CLOUDY (A) 08/28/2016 1428   LABSPEC 1.017 08/28/2016 1428   PHURINE 7.0 08/28/2016 1428   GLUCOSEU NEGATIVE 08/28/2016 1428   HGBUR LARGE (A) 08/28/2016 1428  BILIRUBINUR NEG 09/25/2017 1443   KETONESUR 15 (A) 08/28/2016 1428   PROTEINUR NEG 09/25/2017 1443   PROTEINUR NEGATIVE 08/28/2016 1428   UROBILINOGEN negative 06/12/2016 1610   NITRITE NEG 09/25/2017 1443   NITRITE NEGATIVE 08/28/2016 1428   LEUKOCYTESUR Negative 09/25/2017 1443     STUDIES: No results found.   ELIGIBLE FOR AVAILABLE RESEARCH PROTOCOL: AET  ASSESSMENT: 60 y.o. [place] woman status post right lumpectomy and sentinel lymph node sampling 10/04/2020 for a pT1c pN1, stage IB   invasive ductal carcinoma, grade 2, estrogen and progesterone receptor positive, HER2 not amplified, with an MIB-1 of 30%  (a) a total of 3 right axillary lymph nodes removed, one positive with concern regarding extracapsular extension  (1) MammaPrint high risk predicts a significant benefit from chemotherapy  (2) adjuvant chemotherapy will consist of cyclophosphamide and docetaxel every 21 days x 4 starting 12/14/2020, completed 02/15/2021  (a) patient used DigniCap successfully for hair preservation  (3) adjuvant radiation to follow.  (4) antiestrogens to start at the completion of local treatment   PLAN:  Cassandra Cox completes her adjuvant chemotherapy today.  She has done remarkably well and specifically she neither lost her hair nor developed peripheral neuropathy which are the 2 toxicities from this regiment which may be permanent.  She is ready to proceed to adjuvant radiation.  Once she completes that she will return to see me to discuss antiestrogens.  She knows to call for any other issue that may develop before the next visit  Total encounter time 25 minutes.Sarajane Jews C. Denece Shearer, MD 02/15/2021 8:29 PM Medical Oncology and Hematology [facility] [address], [place] [address] Tel. [phone]    Fax. [phone]   This document serves as a record of services personally performed by Lurline Del, MD. It was created on his behalf by Wilburn Mylar, a trained medical scribe. The creation of this record is based on the scribe's personal observations and the provider's statements to them.   I, Lurline Del MD, have reviewed the above documentation for accuracy and completeness, and I agree with the above.   *Total Encounter Time as  defined by the Centers for Medicare and Medicaid Services includes, in addition to the face-to-face time of a patient visit (documented in the note above) non-face-to-face time: obtaining and reviewing outside history, ordering and reviewing medications, tests or procedures, care coordination (communications with other health care professionals or caregivers) and documentation in the medical record.

## 2021-02-15 ENCOUNTER — Inpatient Hospital Stay: Payer: 59

## 2021-02-15 ENCOUNTER — Encounter: Payer: Self-pay | Admitting: *Deleted

## 2021-02-15 ENCOUNTER — Inpatient Hospital Stay (HOSPITAL_BASED_OUTPATIENT_CLINIC_OR_DEPARTMENT_OTHER): Payer: 59 | Admitting: Oncology

## 2021-02-15 ENCOUNTER — Other Ambulatory Visit: Payer: Self-pay

## 2021-02-15 ENCOUNTER — Inpatient Hospital Stay: Payer: 59 | Attending: Genetic Counselor

## 2021-02-15 VITALS — BP 145/80 | HR 100 | Temp 98.6°F | Resp 16 | Wt 183.0 lb

## 2021-02-15 DIAGNOSIS — Z170 Estrogen receptor positive status [ER+]: Secondary | ICD-10-CM

## 2021-02-15 DIAGNOSIS — C50411 Malignant neoplasm of upper-outer quadrant of right female breast: Secondary | ICD-10-CM

## 2021-02-15 DIAGNOSIS — Z5111 Encounter for antineoplastic chemotherapy: Secondary | ICD-10-CM | POA: Diagnosis present

## 2021-02-15 DIAGNOSIS — Z5189 Encounter for other specified aftercare: Secondary | ICD-10-CM | POA: Insufficient documentation

## 2021-02-15 DIAGNOSIS — Z95828 Presence of other vascular implants and grafts: Secondary | ICD-10-CM

## 2021-02-15 LAB — CBC WITH DIFFERENTIAL (CANCER CENTER ONLY)
Abs Immature Granulocytes: 0.05 10*3/uL (ref 0.00–0.07)
Basophils Absolute: 0 10*3/uL (ref 0.0–0.1)
Basophils Relative: 0 %
Eosinophils Absolute: 0 10*3/uL (ref 0.0–0.5)
Eosinophils Relative: 0 %
HCT: 32.7 % — ABNORMAL LOW (ref 36.0–46.0)
Hemoglobin: 10.8 g/dL — ABNORMAL LOW (ref 12.0–15.0)
Immature Granulocytes: 0 %
Lymphocytes Relative: 6 %
Lymphs Abs: 0.9 10*3/uL (ref 0.7–4.0)
MCH: 30.8 pg (ref 26.0–34.0)
MCHC: 33 g/dL (ref 30.0–36.0)
MCV: 93.2 fL (ref 80.0–100.0)
Monocytes Absolute: 0.8 10*3/uL (ref 0.1–1.0)
Monocytes Relative: 6 %
Neutro Abs: 11.9 10*3/uL — ABNORMAL HIGH (ref 1.7–7.7)
Neutrophils Relative %: 88 %
Platelet Count: 416 10*3/uL — ABNORMAL HIGH (ref 150–400)
RBC: 3.51 MIL/uL — ABNORMAL LOW (ref 3.87–5.11)
RDW: 18.7 % — ABNORMAL HIGH (ref 11.5–15.5)
WBC Count: 13.7 10*3/uL — ABNORMAL HIGH (ref 4.0–10.5)
nRBC: 0 % (ref 0.0–0.2)

## 2021-02-15 LAB — CMP (CANCER CENTER ONLY)
ALT: 39 U/L (ref 0–44)
AST: 19 U/L (ref 15–41)
Albumin: 3.9 g/dL (ref 3.5–5.0)
Alkaline Phosphatase: 99 U/L (ref 38–126)
Anion gap: 13 (ref 5–15)
BUN: 19 mg/dL (ref 8–23)
CO2: 20 mmol/L — ABNORMAL LOW (ref 22–32)
Calcium: 9.5 mg/dL (ref 8.9–10.3)
Chloride: 104 mmol/L (ref 98–111)
Creatinine: 0.69 mg/dL (ref 0.44–1.00)
GFR, Estimated: >60 mL/min (ref >60)
Glucose, Bld: 180 mg/dL — ABNORMAL HIGH (ref 70–99)
Potassium: 4 mmol/L (ref 3.5–5.1)
Sodium: 137 mmol/L (ref 135–145)
Total Bilirubin: 0.3 mg/dL (ref 0.3–1.2)
Total Protein: 6.9 g/dL (ref 6.5–8.1)

## 2021-02-15 MED ORDER — HEPARIN SOD (PORK) LOCK FLUSH 100 UNIT/ML IV SOLN
500.0000 [IU] | Freq: Once | INTRAVENOUS | Status: AC | PRN
Start: 2021-02-15 — End: 2021-02-15
  Administered 2021-02-15: 500 [IU]
  Filled 2021-02-15: qty 5

## 2021-02-15 MED ORDER — SODIUM CHLORIDE 0.9 % IV SOLN
75.0000 mg/m2 | Freq: Once | INTRAVENOUS | Status: AC
  Administered 2021-02-15: 140 mg via INTRAVENOUS
  Filled 2021-02-15: qty 14

## 2021-02-15 MED ORDER — SODIUM CHLORIDE 0.9% FLUSH
10.0000 mL | Freq: Once | INTRAVENOUS | Status: AC
  Administered 2021-02-15: 10 mL
  Filled 2021-02-15: qty 10

## 2021-02-15 MED ORDER — PALONOSETRON HCL INJECTION 0.25 MG/5ML
0.2500 mg | Freq: Once | INTRAVENOUS | Status: AC
Start: 2021-02-15 — End: 2021-02-15
  Administered 2021-02-15: 0.25 mg via INTRAVENOUS

## 2021-02-15 MED ORDER — SODIUM CHLORIDE 0.9 % IV SOLN
Freq: Once | INTRAVENOUS | Status: AC
  Filled 2021-02-15: qty 250

## 2021-02-15 MED ORDER — PALONOSETRON HCL INJECTION 0.25 MG/5ML
INTRAVENOUS | Status: AC
  Filled 2021-02-15: qty 5

## 2021-02-15 MED ORDER — SODIUM CHLORIDE 0.9 % IV SOLN
600.0000 mg/m2 | Freq: Once | INTRAVENOUS | Status: AC
  Administered 2021-02-15: 1160 mg via INTRAVENOUS
  Filled 2021-02-15: qty 58

## 2021-02-15 MED ORDER — SODIUM CHLORIDE 0.9% FLUSH
10.0000 mL | INTRAVENOUS | Status: DC | PRN
  Administered 2021-02-15: 10 mL
  Filled 2021-02-15: qty 10

## 2021-02-15 MED ORDER — DEXAMETHASONE SODIUM PHOSPHATE 100 MG/10ML IJ SOLN
10.0000 mg | Freq: Once | INTRAMUSCULAR | Status: AC
  Administered 2021-02-15: 10 mg via INTRAVENOUS
  Filled 2021-02-15: qty 10

## 2021-02-15 NOTE — Patient Instructions (Signed)
[facility] Discharge Instructions for Patients Receiving Chemotherapy  Today you received the following chemotherapy agents: Docetaxel (Taxotere) and Cytoxan  To help prevent nausea and vomiting after your treatment, we encourage you to take your nausea medication as directed by your MD.   If you develop nausea and vomiting that is not controlled by your nausea medication, call the clinic.   BELOW ARE SYMPTOMS THAT SHOULD BE REPORTED IMMEDIATELY:  *FEVER GREATER THAN 100.5 F  *CHILLS WITH OR WITHOUT FEVER  NAUSEA AND VOMITING THAT IS NOT CONTROLLED WITH YOUR NAUSEA MEDICATION  *UNUSUAL SHORTNESS OF BREATH  *UNUSUAL BRUISING OR BLEEDING  TENDERNESS IN MOUTH AND THROAT WITH OR WITHOUT PRESENCE OF ULCERS  *URINARY PROBLEMS  *BOWEL PROBLEMS  UNUSUAL RASH Items with * indicate a potential emergency and should be followed up as soon as possible.  Feel free to call the clinic should you have any questions or concerns. The clinic phone number is [phone].  Please show the [place] at check-in to the Emergency Department and triage nurse.

## 2021-02-16 ENCOUNTER — Telehealth: Payer: Self-pay | Admitting: Oncology

## 2021-02-16 NOTE — Telephone Encounter (Signed)
Scheduled appt per 4/12 sch msg. Called pt, no answer. Left msg with appt date and time.

## 2021-02-17 ENCOUNTER — Inpatient Hospital Stay: Payer: 59

## 2021-02-17 ENCOUNTER — Telehealth: Payer: Self-pay | Admitting: Oncology

## 2021-02-17 ENCOUNTER — Other Ambulatory Visit: Payer: Self-pay

## 2021-02-17 VITALS — BP 121/68 | HR 92

## 2021-02-17 DIAGNOSIS — C50411 Malignant neoplasm of upper-outer quadrant of right female breast: Secondary | ICD-10-CM

## 2021-02-17 DIAGNOSIS — Z95828 Presence of other vascular implants and grafts: Secondary | ICD-10-CM

## 2021-02-17 DIAGNOSIS — Z170 Estrogen receptor positive status [ER+]: Secondary | ICD-10-CM

## 2021-02-17 DIAGNOSIS — Z5111 Encounter for antineoplastic chemotherapy: Secondary | ICD-10-CM | POA: Diagnosis not present

## 2021-02-17 MED ORDER — SODIUM CHLORIDE 0.9% FLUSH
10.0000 mL | Freq: Once | INTRAVENOUS | Status: AC
  Administered 2021-02-17: 10 mL
  Filled 2021-02-17: qty 10

## 2021-02-17 MED ORDER — PEGFILGRASTIM-BMEZ 6 MG/0.6ML ~~LOC~~ SOSY
6.0000 mg | PREFILLED_SYRINGE | Freq: Once | SUBCUTANEOUS | Status: AC
  Administered 2021-02-17: 6 mg via SUBCUTANEOUS

## 2021-02-17 MED ORDER — HEPARIN SOD (PORK) LOCK FLUSH 100 UNIT/ML IV SOLN
500.0000 [IU] | Freq: Once | INTRAVENOUS | Status: AC
  Administered 2021-02-17: 500 [IU]
  Filled 2021-02-17: qty 5

## 2021-02-17 MED ORDER — PEGFILGRASTIM-BMEZ 6 MG/0.6ML ~~LOC~~ SOSY
PREFILLED_SYRINGE | SUBCUTANEOUS | Status: AC
  Filled 2021-02-17: qty 0.6

## 2021-02-17 MED ORDER — SODIUM CHLORIDE 0.9 % IV SOLN
INTRAVENOUS | Status: DC
  Filled 2021-02-17: qty 250

## 2021-02-17 NOTE — Telephone Encounter (Signed)
Scheduled per 4/12 los. Called and spoke with pt confirmed 6/28 appts

## 2021-02-17 NOTE — Patient Instructions (Signed)
Rehydration, Adult Rehydration is the replacement of body fluids, salts, and minerals (electrolytes) that are lost during dehydration. Dehydration is when there is not enough water or other fluids in the body. This happens when you lose more fluids than you take in. Common causes of dehydration include:  Not drinking enough fluids. This can occur when you are ill or doing activities that require a lot of energy, especially in hot weather.  Conditions that cause loss of water or other fluids, such as diarrhea, vomiting, sweating, or urinating a lot.  Other illnesses, such as fever or infection.  Certain medicines, such as those that remove excess fluid from the body (diuretics). Symptoms of mild or moderate dehydration may include thirst, dry lips and mouth, and dizziness. Symptoms of severe dehydration may include increased heart rate, confusion, fainting, and not urinating. For severe dehydration, you may need to get fluids through an IV at the hospital. For mild or moderate dehydration, you can usually rehydrate at home by drinking certain fluids as told by your health care provider. What are the risks? Generally, rehydration is safe. However, taking in too much fluid (overhydration) can be a problem. This is rare. Overhydration can cause an electrolyte imbalance, kidney failure, or a decrease in salt (sodium) levels in the body. Supplies needed You will need an oral rehydration solution (ORS) if your health care provider tells you to use one. This is a drink to treat dehydration. It can be found in pharmacies and retail stores. How to rehydrate Fluids Follow instructions from your health care provider for rehydration. The kind of fluid and the amount you should drink depend on your condition. In general, you should choose drinks that you prefer.  If told by your health care provider, drink an ORS. ? Make an ORS by following instructions on the package. ? Start by drinking small amounts,  about  cup (120 mL) every 5-10 minutes. ? Slowly increase how much you drink until you have taken the amount recommended by your health care provider.  Drink enough clear fluids to keep your urine pale yellow. If you were told to drink an ORS, finish it first, then start slowly drinking other clear fluids. Drink fluids such as: ? Water. This includes sparkling water and flavored water. Drinking only water can lead to having too little sodium in your body (hyponatremia). Follow the advice of your health care provider. ? Water from ice chips you suck on. ? Fruit juice with water you add to it (diluted). ? Sports drinks. ? Hot or cold herbal teas. ? Broth-based soups. ? Milk or milk products. Food Follow instructions from your health care provider about what to eat while you rehydrate. Your health care provider may recommend that you slowly begin eating regular foods in small amounts.  Eat foods that contain a healthy balance of electrolytes, such as bananas, oranges, potatoes, tomatoes, and spinach.  Avoid foods that are greasy or contain a lot of sugar. In some cases, you may get nutrition through a feeding tube that is passed through your nose and into your stomach (nasogastric tube, or NG tube). This may be done if you have uncontrolled vomiting or diarrhea.   Beverages to avoid Certain beverages may make dehydration worse. While you rehydrate, avoid drinking alcohol.   How to tell if you are recovering from dehydration You may be recovering from dehydration if:  You are urinating more often than before you started rehydrating.  Your urine is pale yellow.  Your energy level   improves.  You vomit less frequently.  You have diarrhea less frequently.  Your appetite improves or returns to normal.  You feel less dizzy or less light-headed.  Your skin tone and color start to look more normal. Follow these instructions at home:  Take over-the-counter and prescription medicines only  as told by your health care provider.  Do not take sodium tablets. Doing this can lead to having too much sodium in your body (hypernatremia). Contact a health care provider if:  You continue to have symptoms of mild or moderate dehydration, such as: ? Thirst. ? Dry lips. ? Slightly dry mouth. ? Dizziness. ? Dark urine or less urine than normal. ? Muscle cramps.  You continue to vomit or have diarrhea. Get help right away if you:  Have symptoms of dehydration that get worse.  Have a fever.  Have a severe headache.  Have been vomiting and the following happens: ? Your vomiting gets worse or does not go away. ? Your vomit includes blood or green matter (bile). ? You cannot eat or drink without vomiting.  Have problems with urination or bowel movements, such as: ? Diarrhea that gets worse or does not go away. ? Blood in your stool (feces). This may cause stool to look black and tarry. ? Not urinating, or urinating only a small amount of very dark urine, within 6-8 hours.  Have trouble breathing.  Have symptoms that get worse with treatment. These symptoms may represent a serious problem that is an emergency. Do not wait to see if the symptoms will go away. Get medical help right away. Call your local emergency services (911 in the [place] Do not drive yourself to the hospital. Summary  Rehydration is the replacement of body fluids and minerals (electrolytes) that are lost during dehydration.  Follow instructions from your health care provider for rehydration. The kind of fluid and amount you should drink depend on your condition.  Slowly increase how much you drink until you have taken the amount recommended by your health care provider.  Contact your health care provider if you continue to show signs of mild or moderate dehydration. This information is not intended to replace advice given to you by your health care provider. Make sure you discuss any questions you have with  your health care provider. Document Revised: 12/24/2019 Document Reviewed: 11/03/2019 Elsevier Patient Education  2021 [employer]  

## 2021-02-19 ENCOUNTER — Inpatient Hospital Stay: Payer: 59

## 2021-02-19 ENCOUNTER — Other Ambulatory Visit: Payer: Self-pay

## 2021-02-19 VITALS — BP 114/51 | HR 94 | Temp 98.7°F | Resp 18

## 2021-02-19 DIAGNOSIS — Z5111 Encounter for antineoplastic chemotherapy: Secondary | ICD-10-CM | POA: Diagnosis not present

## 2021-02-19 DIAGNOSIS — Z95828 Presence of other vascular implants and grafts: Secondary | ICD-10-CM

## 2021-02-19 MED ORDER — SODIUM CHLORIDE 0.9% FLUSH
10.0000 mL | Freq: Once | INTRAVENOUS | Status: AC
  Administered 2021-02-19: 10 mL
  Filled 2021-02-19: qty 10

## 2021-02-19 MED ORDER — SODIUM CHLORIDE 0.9 % IV SOLN
INTRAVENOUS | Status: DC
  Filled 2021-02-19 (×2): qty 250

## 2021-02-19 MED ORDER — HEPARIN SOD (PORK) LOCK FLUSH 100 UNIT/ML IV SOLN
500.0000 [IU] | Freq: Once | INTRAVENOUS | Status: AC
  Administered 2021-02-19: 500 [IU]
  Filled 2021-02-19: qty 5

## 2021-02-19 NOTE — Patient Instructions (Signed)
Rehydration, Adult Rehydration is the replacement of body fluids, salts, and minerals (electrolytes) that are lost during dehydration. Dehydration is when there is not enough water or other fluids in the body. This happens when you lose more fluids than you take in. Common causes of dehydration include:  Not drinking enough fluids. This can occur when you are ill or doing activities that require a lot of energy, especially in hot weather.  Conditions that cause loss of water or other fluids, such as diarrhea, vomiting, sweating, or urinating a lot.  Other illnesses, such as fever or infection.  Certain medicines, such as those that remove excess fluid from the body (diuretics). Symptoms of mild or moderate dehydration may include thirst, dry lips and mouth, and dizziness. Symptoms of severe dehydration may include increased heart rate, confusion, fainting, and not urinating. For severe dehydration, you may need to get fluids through an IV at the hospital. For mild or moderate dehydration, you can usually rehydrate at home by drinking certain fluids as told by your health care provider. What are the risks? Generally, rehydration is safe. However, taking in too much fluid (overhydration) can be a problem. This is rare. Overhydration can cause an electrolyte imbalance, kidney failure, or a decrease in salt (sodium) levels in the body. Supplies needed You will need an oral rehydration solution (ORS) if your health care provider tells you to use one. This is a drink to treat dehydration. It can be found in pharmacies and retail stores. How to rehydrate Fluids Follow instructions from your health care provider for rehydration. The kind of fluid and the amount you should drink depend on your condition. In general, you should choose drinks that you prefer.  If told by your health care provider, drink an ORS. ? Make an ORS by following instructions on the package. ? Start by drinking small amounts,  about  cup (120 mL) every 5-10 minutes. ? Slowly increase how much you drink until you have taken the amount recommended by your health care provider.  Drink enough clear fluids to keep your urine pale yellow. If you were told to drink an ORS, finish it first, then start slowly drinking other clear fluids. Drink fluids such as: ? Water. This includes sparkling water and flavored water. Drinking only water can lead to having too little sodium in your body (hyponatremia). Follow the advice of your health care provider. ? Water from ice chips you suck on. ? Fruit juice with water you add to it (diluted). ? Sports drinks. ? Hot or cold herbal teas. ? Broth-based soups. ? Milk or milk products. Food Follow instructions from your health care provider about what to eat while you rehydrate. Your health care provider may recommend that you slowly begin eating regular foods in small amounts.  Eat foods that contain a healthy balance of electrolytes, such as bananas, oranges, potatoes, tomatoes, and spinach.  Avoid foods that are greasy or contain a lot of sugar. In some cases, you may get nutrition through a feeding tube that is passed through your nose and into your stomach (nasogastric tube, or NG tube). This may be done if you have uncontrolled vomiting or diarrhea.   Beverages to avoid Certain beverages may make dehydration worse. While you rehydrate, avoid drinking alcohol.   How to tell if you are recovering from dehydration You may be recovering from dehydration if:  You are urinating more often than before you started rehydrating.  Your urine is pale yellow.  Your energy level   improves.  You vomit less frequently.  You have diarrhea less frequently.  Your appetite improves or returns to normal.  You feel less dizzy or less light-headed.  Your skin tone and color start to look more normal. Follow these instructions at home:  Take over-the-counter and prescription medicines only  as told by your health care provider.  Do not take sodium tablets. Doing this can lead to having too much sodium in your body (hypernatremia). Contact a health care provider if:  You continue to have symptoms of mild or moderate dehydration, such as: ? Thirst. ? Dry lips. ? Slightly dry mouth. ? Dizziness. ? Dark urine or less urine than normal. ? Muscle cramps.  You continue to vomit or have diarrhea. Get help right away if you:  Have symptoms of dehydration that get worse.  Have a fever.  Have a severe headache.  Have been vomiting and the following happens: ? Your vomiting gets worse or does not go away. ? Your vomit includes blood or green matter (bile). ? You cannot eat or drink without vomiting.  Have problems with urination or bowel movements, such as: ? Diarrhea that gets worse or does not go away. ? Blood in your stool (feces). This may cause stool to look black and tarry. ? Not urinating, or urinating only a small amount of very dark urine, within 6-8 hours.  Have trouble breathing.  Have symptoms that get worse with treatment. These symptoms may represent a serious problem that is an emergency. Do not wait to see if the symptoms will go away. Get medical help right away. Call your local emergency services (911 in the [place] Do not drive yourself to the hospital. Summary  Rehydration is the replacement of body fluids and minerals (electrolytes) that are lost during dehydration.  Follow instructions from your health care provider for rehydration. The kind of fluid and amount you should drink depend on your condition.  Slowly increase how much you drink until you have taken the amount recommended by your health care provider.  Contact your health care provider if you continue to show signs of mild or moderate dehydration. This information is not intended to replace advice given to you by your health care provider. Make sure you discuss any questions you have with  your health care provider. Document Revised: 12/24/2019 Document Reviewed: 11/03/2019 Elsevier Patient Education  2021 [employer]  

## 2021-02-22 ENCOUNTER — Other Ambulatory Visit: Payer: 59

## 2021-02-22 ENCOUNTER — Ambulatory Visit: Payer: 59

## 2021-02-23 ENCOUNTER — Other Ambulatory Visit: Payer: Self-pay | Admitting: Radiation Oncology

## 2021-02-23 ENCOUNTER — Encounter: Payer: Self-pay | Admitting: Radiation Oncology

## 2021-02-23 ENCOUNTER — Ambulatory Visit
Admission: RE | Admit: 2021-02-23 | Discharge: 2021-02-23 | Disposition: A | Discharge status: Home / Self Care | Payer: 59 | Source: Ambulatory Visit | Attending: Radiation Oncology | Admitting: Radiation Oncology

## 2021-02-23 ENCOUNTER — Other Ambulatory Visit: Payer: Self-pay

## 2021-02-23 VITALS — BP 131/72 | HR 108 | Temp 96.9°F | Resp 18 | Ht 63.0 in | Wt 184.4 lb

## 2021-02-23 DIAGNOSIS — E785 Hyperlipidemia, unspecified: Secondary | ICD-10-CM | POA: Insufficient documentation

## 2021-02-23 DIAGNOSIS — F419 Anxiety disorder, unspecified: Secondary | ICD-10-CM | POA: Insufficient documentation

## 2021-02-23 DIAGNOSIS — E119 Type 2 diabetes mellitus without complications: Secondary | ICD-10-CM | POA: Diagnosis not present

## 2021-02-23 DIAGNOSIS — E039 Hypothyroidism, unspecified: Secondary | ICD-10-CM | POA: Diagnosis not present

## 2021-02-23 DIAGNOSIS — Z803 Family history of malignant neoplasm of breast: Secondary | ICD-10-CM | POA: Insufficient documentation

## 2021-02-23 DIAGNOSIS — Z87442 Personal history of urinary calculi: Secondary | ICD-10-CM | POA: Insufficient documentation

## 2021-02-23 DIAGNOSIS — Z7982 Long term (current) use of aspirin: Secondary | ICD-10-CM | POA: Insufficient documentation

## 2021-02-23 DIAGNOSIS — E538 Deficiency of other specified B group vitamins: Secondary | ICD-10-CM | POA: Diagnosis not present

## 2021-02-23 DIAGNOSIS — C50411 Malignant neoplasm of upper-outer quadrant of right female breast: Secondary | ICD-10-CM | POA: Insufficient documentation

## 2021-02-23 DIAGNOSIS — Z79899 Other long term (current) drug therapy: Secondary | ICD-10-CM | POA: Diagnosis not present

## 2021-02-23 DIAGNOSIS — Z7984 Long term (current) use of oral hypoglycemic drugs: Secondary | ICD-10-CM | POA: Insufficient documentation

## 2021-02-23 DIAGNOSIS — Z170 Estrogen receptor positive status [ER+]: Secondary | ICD-10-CM | POA: Diagnosis not present

## 2021-02-23 DIAGNOSIS — I252 Old myocardial infarction: Secondary | ICD-10-CM | POA: Diagnosis not present

## 2021-02-23 DIAGNOSIS — I2510 Atherosclerotic heart disease of native coronary artery without angina pectoris: Secondary | ICD-10-CM | POA: Insufficient documentation

## 2021-02-23 DIAGNOSIS — I341 Nonrheumatic mitral (valve) prolapse: Secondary | ICD-10-CM | POA: Diagnosis not present

## 2021-02-23 NOTE — Progress Notes (Signed)
Radiation Oncology         [phone] ________________________________  Name: Cassandra Cox        MRN: [id number]  Date of Service: 02/23/2021 DOB: 1961/05/21  CC:Pcp, No  Magrinat, Virgie Dad, MD     REFERRING PHYSICIAN: Magrinat, Virgie Dad, MD   DIAGNOSIS: The encounter diagnosis was Malignant neoplasm of upper-outer quadrant of right breast in female, estrogen receptor positive ([place]).   HISTORY OF PRESENT ILLNESS: Cassandra Cox is a 60 y.o. female originally seen in the multidisciplinary breast clinic for a diagnosis of right breast cancer. The patient was noted to have a screening detected distortion in the right breast in the upper outer quadrant.  The patient proceeded with diagnostic imaging which revealed this architectural distortion measuring approximately 1.2 cm.  She also underwent ultrasound on 07/20/2020 that did not reveal any ultrasound correlate.  Her axilla was negative for adenopathy, and she subsequently underwent a stereotactic biopsy revealed a grade 3 invasive ductal carcinoma with associated DCIS.  Her tumor was ER/PR positive, HER-2 was negative and Ki-67 was 30%.  Since the patient's last visit, she has undergone right breast lumpectomy with reduction and sentinel lymph node biopsy as well as left oncoplastic reduction on 10/04/2020.  Final pathology revealed adenosis and no malignancy within the left breast specimen.  Her right lumpectomy showed a 1.2 cm grade 2 invasive ductal carcinoma with associated intermediate grade DCIS, her margins were uninvolved by carcinoma with the medial margin being the closest less than 1 mm but additional deep margins showed residual invasive ductal carcinoma but posteriorly were 1 mm.  She did have 3 sentinel lymph nodes removed of the right axilla and 1 contained metastatic disease.  Her MammaPrint testing showed high risk features and given this she proceeded with adjuvant chemotherapy between 12/14/2020 and 02/15/2021.  She is seen today  to discuss adjuvant radiotherapy to the right breast and regional lymph nodes.   PREVIOUS RADIATION THERAPY: No   PAST MEDICAL HISTORY:  Past Medical History:  Diagnosis Date  . Anxiety   . Complication of anesthesia   . Coronary artery disease    a. 11/2016 Cath/PCI: LM nl, LAD 30ost, 15m(3.0x18 Resolute Integrity DES--3.75), LCX nl, RCA nl; b. MV 11/18: negative for ischemia, EF 72%, low risk  . Depression   . Diabetes mellitus without complication ([place]    type 2  . Diastolic dysfunction    a. 11/2016 Echo: EF 55-60%, Gr2 DD, LAE, nl RV size/fxn.  . Family history of breast cancer   . History of kidney stones   . Hyperlipemia \\[id number]\ . Hypothyroidism   . Kidney stone 08/2016  . Mitral valve prolapse   . MVP (mitral valve prolapse)   . Neuromuscular disorder ([place]    bilateral feet  . PONV (postoperative nausea and vomiting)   . STEMI (ST elevation myocardial infarction) ([place] 12/02/2016  . Vitamin B 12 deficiency        PAST SURGICAL HISTORY: Past Surgical History:  Procedure Laterality Date  . ABDOMINAL HYSTERECTOMY  2003   TAH due to adenomyosis  . BREAST LUMPECTOMY WITH RADIOACTIVE SEED AND SENTINEL LYMPH NODE BIOPSY Right 10/04/2020   Procedure: RIGHT BREAST REDUCTION LUMPECTOMY WITH RADIOACTIVE SEED AND SENTINEL LYMPH NODE BIOPSY;  Surgeon: TJovita Kussmaul MD;  [facility]  Service: General;  Laterality: Right;  . BREAST REDUCTION SURGERY  1990  . BREAST REDUCTION SURGERY Bilateral 10/04/2020   Procedure: BILATERAL MAMMARY REDUCTION  (BREAST);  Surgeon: Wallace Going, DO;  Location: [facility];  Service: Plastics;  Laterality: Bilateral;  . CARDIAC CATHETERIZATION N/A 12/02/2016   Procedure: Coronary Stent Intervention;  Surgeon: Nelva Bush, MD;  Location: [facility];  Service: Cardiovascular;  Laterality: N/A;  . CARDIAC CATHETERIZATION N/A 12/02/2016   Procedure: Left Heart Cath and Coronary  Angiography;  Surgeon: Nelva Bush, MD;  Location: [facility] [facility];  Service: Cardiovascular;  Laterality: N/A;  . CARDIAC CATHETERIZATION N/A 12/02/2016   Procedure: Intravascular Ultrasound/IVUS;  Surgeon: Nelva Bush, MD;  Location: [facility] [place] [facility];  Service: Cardiovascular;  Laterality: N/A;  . CORONARY STENT INTERVENTION    . MOUTH SURGERY     Dental implant  . PORTACATH PLACEMENT Left 12/13/2020   Procedure: INSERTION PORT-A-CATH LEFT SUBCLAVIAN;  Surgeon: Jovita Kussmaul, MD;  Location: [place];  Service: General;  Laterality: Left;     FAMILY HISTORY:  Family History  Problem Relation Age of Onset  . Heart disease Mother   . Diabetes Mother   . Stroke Mother   . Heart attack Mother   . Atrial fibrillation Mother   . Hypertension Mother   . Hyperlipidemia Mother   . COPD Father   . Hypertension Father   . Hyperlipidemia Father   . Breast cancer Maternal Aunt 73       x2 age 25  . Diabetes Maternal Grandfather      SOCIAL HISTORY:  reports that she has never smoked. She has never used smokeless tobacco. She reports that she does not drink alcohol and does not use drugs.  The patient resides in [place] and is divorced.  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   ALLERGIES: Prednisone, Sulfa antibiotics, Sulfasalazine, and Sulfonamide derivatives   MEDICATIONS:  Current Outpatient Medications  Medication Sig Dispense Refill  . ALPRAZolam (XANAX) 0.25 MG tablet Take 0.25 mg by mouth 3 (three) times daily as needed for anxiety.    Francia Greaves THYROID 60 MG tablet Take 60 mg by mouth daily.    Marland Kitchen aspirin EC 81 MG tablet Take 81 mg by mouth daily.    Marland Kitchen atorvastatin (LIPITOR) 80 MG tablet TAKE 1 TABLET BY MOUTH DAILY AT 6PM GENERIC EQUIVALENT FOR LIPITOR 90 tablet 0  . buPROPion (WELLBUTRIN XL) 150 MG 24 hr tablet Take 300 mg by mouth daily.     . Cholecalciferol (VITAMIN D-3) 5000 UNITS TABS Take 1  tablet by mouth daily.    . clopidogrel (PLAVIX) 75 MG tablet TAKE 1 TABLET BY MOUTH DAILY GENERIC EQUIVALENT FOR PLAVIX 90 tablet 3  . Coenzyme Q10 (COQ10) 100 MG CAPS Take 100 mg by mouth daily.    . Cyanocobalamin 1000 MCG/ML KIT Inject 1,000 mcg as directed once a week.    Marland Kitchen dexamethasone (DECADRON) 4 MG tablet Take 1 tablet (4 mg total) by mouth 2 (two) times daily. Start the day before Taxotere. Then 1 tab daily the day after chemo for 2 days. 30 tablet 1  . ezetimibe (ZETIA) 10 MG tablet Take 1 tablet (10 mg total) by mouth daily. 90 tablet 0  . FLUoxetine (PROZAC) 20 MG capsule Take 20 mg by mouth daily.     Marland Kitchen HYDROcodone-acetaminophen (NORCO/VICODIN) 5-325 MG tablet Take 1-2 tablets by mouth every 6 (six) hours as needed for moderate pain or severe pain. 10 tablet 0  . ketoconazole (NIZORAL) 2 % cream Apply 1 application topically daily. 15  g 0  . levocetirizine (XYZAL) 5 MG tablet Take 1 tablet at bedtime by mouth.  2  . lidocaine-prilocaine (EMLA) cream Apply to affected area once 30 g 3  . liothyronine (CYTOMEL) 5 MCG tablet Take 5 mcg by mouth every morning.     . magnesium gluconate (MAGONATE) 500 MG tablet Take 1,000 mg by mouth at bedtime.    . metFORMIN (GLUCOPHAGE-XR) 500 MG 24 hr tablet Take 500 mg by mouth 2 (two) times daily.    . montelukast (SINGULAIR) 10 MG tablet Take 10 mg by mouth every morning.    Marland Kitchen NALTREXONE HCL PO Take 4 mg by mouth at bedtime.     . nitroGLYCERIN (NITROSTAT) 0.4 MG SL tablet Place 1 tablet (0.4 mg total) under the tongue every 5 (five) minutes as needed for chest pain. For maximum of 3 doses. 25 tablet 3  . ondansetron (ZOFRAN) 8 MG tablet Take 1 tablet (8 mg total) by mouth 2 (two) times daily as needed for refractory nausea / vomiting. Start on day 3 after chemo. 30 tablet 1  . prochlorperazine (COMPAZINE) 10 MG tablet Take 1 tablet (10 mg total) by mouth every 6 (six) hours as needed (Nausea or vomiting). 30 tablet 1  . valACYclovir (VALTREX) 500  MG tablet Take 1 tablet (500 mg total) by mouth daily. 90 tablet 1  . vitamin C (ASCORBIC ACID) 500 MG tablet Take 500 mg by mouth daily.    Marland Kitchen zinc gluconate 50 MG tablet Take 50 mg by mouth daily.    Marland Kitchen zolpidem (AMBIEN CR) 12.5 MG CR tablet Take 12.5 mg by mouth at bedtime as needed for sleep.     No current facility-administered medications for this encounter.     REVIEW OF SYSTEMS: On review of systems, the patient reports that she is doing well overall. She tolerated chemotherapy well per report but did have fatigue. She reports headache and bone aches the day of and following her ziextenzo injection.    PHYSICAL EXAM:  Wt Readings from Last 3 Encounters:  02/15/21 183 lb (83 kg)  01/27/21 180 lb 4.8 oz (81.8 kg)  01/26/21 182 lb 6 oz (82.7 kg)   Temp Readings from Last 3 Encounters:  02/19/21 98.7 F (37.1 C) (Oral)  02/15/21 98.6 F (37 C) (Oral)  01/29/21 98.7 F (37.1 C) (Oral)   BP Readings from Last 3 Encounters:  02/19/21 (!) 114/51  02/17/21 121/68  02/15/21 (!) 145/80   Pulse Readings from Last 3 Encounters:  02/19/21 94  02/17/21 92  02/15/21 100    In general this is a well appearing Caucasian female in no acute distress. She's alert and oriented x4 and appropriate throughout the examination. Cardiopulmonary assessment is negative for acute distress and she exhibits normal effort. Breast exam reveals well healed breast incision sites with erythema consistent with visible capillary beds without separation.    ECOG = 1  0 - Asymptomatic (Fully active, able to carry on all predisease activities without restriction)  1 - Symptomatic but completely ambulatory (Restricted in physically strenuous activity but ambulatory and able to carry out work of a light or sedentary nature. For example, light housework, office work)  2 - Symptomatic, <50% in bed during the day (Ambulatory and capable of all self care but unable to carry out any work activities. Up and about  more than 50% of waking hours)  3 - Symptomatic, >50% in bed, but not bedbound (Capable of only limited self-care, confined to bed or  chair 50% or more of waking hours)  4 - Bedbound (Completely disabled. Cannot carry on any self-care. Totally confined to bed or chair)  5 - Death   Eustace Pen MM, Creech RH, Tormey DC, et al. [phone]). "Toxicity and response criteria of the [facility]". Man Oncol. 5 (6): 649-55    LABORATORY DATA:  Lab Results  Component Value Date   WBC 13.7 (H) 02/15/2021   HGB 10.8 (L) 02/15/2021   HCT 32.7 (L) 02/15/2021   MCV 93.2 02/15/2021   PLT 416 (H) 02/15/2021   Lab Results  Component Value Date   NA 137 02/15/2021   K 4.0 02/15/2021   CL 104 02/15/2021   CO2 20 (L) 02/15/2021   Lab Results  Component Value Date   ALT 39 02/15/2021   AST 19 02/15/2021   ALKPHOS 99 02/15/2021   BILITOT 0.3 02/15/2021      RADIOGRAPHY: No results found.     IMPRESSION/PLAN: 1. Stage IA, pT1cN1aM0, grade 2, ER/PR positive invasive ductal carcinoma of the right breast. I reviewed her final pathology findings with Dr. Lisbeth Renshaw and course since her last visit. I reveiwed  the rationale for external radiotherapy to the breast and regional nodes to reduce risks of local recurrence followed by antiestrogen therapy. We discussed the risks, benefits, short, and long term effects of radiotherapy, as well as the curative intent, and the patient is interested in proceeding. That being said, she would like treatment closer to home since she lives in [place]. If she were to be treated in [place], Dr. Lisbeth Renshaw would offer a course of 6 1/2 weeks of radiotherapy to the right breast and regional nodes. Given her interest in being treated at [facility], we will refer her to meet with Dr. Baruch Gouty.  In a visit lasting  45 minutes, greater than 50% of the time was spent face to face reviewing her case, as well as in preparation of,  discussing, and coordinating the patient's care.     Carola Rhine, PAC

## 2021-02-24 ENCOUNTER — Ambulatory Visit: Payer: 59 | Admitting: Radiation Oncology

## 2021-03-01 ENCOUNTER — Encounter: Payer: Self-pay | Admitting: *Deleted

## 2021-03-03 ENCOUNTER — Other Ambulatory Visit: Payer: Self-pay

## 2021-03-03 ENCOUNTER — Ambulatory Visit: Payer: 59 | Admitting: Radiation Oncology

## 2021-03-03 ENCOUNTER — Ambulatory Visit
Admission: RE | Admit: 2021-03-03 | Discharge: 2021-03-03 | Disposition: A | Discharge status: Home / Self Care | Payer: 59 | Source: Ambulatory Visit | Attending: Radiation Oncology | Admitting: Radiation Oncology

## 2021-03-03 VITALS — BP 134/73 | HR 104 | Temp 97.5°F | Resp 16 | Wt 182.5 lb

## 2021-03-03 DIAGNOSIS — Z170 Estrogen receptor positive status [ER+]: Secondary | ICD-10-CM | POA: Insufficient documentation

## 2021-03-03 DIAGNOSIS — E538 Deficiency of other specified B group vitamins: Secondary | ICD-10-CM | POA: Diagnosis not present

## 2021-03-03 DIAGNOSIS — I2510 Atherosclerotic heart disease of native coronary artery without angina pectoris: Secondary | ICD-10-CM | POA: Insufficient documentation

## 2021-03-03 DIAGNOSIS — I341 Nonrheumatic mitral (valve) prolapse: Secondary | ICD-10-CM | POA: Diagnosis not present

## 2021-03-03 DIAGNOSIS — Z7984 Long term (current) use of oral hypoglycemic drugs: Secondary | ICD-10-CM | POA: Diagnosis not present

## 2021-03-03 DIAGNOSIS — E119 Type 2 diabetes mellitus without complications: Secondary | ICD-10-CM | POA: Insufficient documentation

## 2021-03-03 DIAGNOSIS — I252 Old myocardial infarction: Secondary | ICD-10-CM | POA: Diagnosis not present

## 2021-03-03 DIAGNOSIS — E785 Hyperlipidemia, unspecified: Secondary | ICD-10-CM | POA: Diagnosis not present

## 2021-03-03 DIAGNOSIS — Z7982 Long term (current) use of aspirin: Secondary | ICD-10-CM | POA: Insufficient documentation

## 2021-03-03 DIAGNOSIS — Z79899 Other long term (current) drug therapy: Secondary | ICD-10-CM | POA: Insufficient documentation

## 2021-03-03 DIAGNOSIS — C50411 Malignant neoplasm of upper-outer quadrant of right female breast: Secondary | ICD-10-CM | POA: Insufficient documentation

## 2021-03-03 DIAGNOSIS — Z87442 Personal history of urinary calculi: Secondary | ICD-10-CM | POA: Diagnosis not present

## 2021-03-03 DIAGNOSIS — E039 Hypothyroidism, unspecified: Secondary | ICD-10-CM | POA: Diagnosis not present

## 2021-03-03 NOTE — Consult Note (Signed)
NEW PATIENT EVALUATION  Name: Cassandra Cox  MRN: [id number]  Date:   03/03/2021     DOB: Jun 28, 1961   This 60 y.o. female patient presents to the clinic for initial evaluation of stage T1 cN1 aM0 ER/PR positive HER2 negative invasive mammary carcinoma of the right breast status post wide local excision sentinel biopsy and adjuvant chemotherapy.  Stage Ib  REFERRING PHYSICIAN: Hayden Pedro,*  CHIEF COMPLAINT:  Chief Complaint  Patient presents with  . Breast Cancer    Initial consultation    DIAGNOSIS: The encounter diagnosis was Malignant neoplasm of upper-outer quadrant of right breast in female, estrogen receptor positive ([place]).   PREVIOUS INVESTIGATIONS:  Pathology report reviewed Mammogram and ultrasound reviewed Clinical notes reviewed  HPI: Patient is a 60 year old female who presented with an abnormal mammogram of the right breast.  There was a 1.1 cm regular mass in the upper outer right breast 7 cm the nipple confirmed on ultrasound.  She underwent an ultrasound-guided biopsy.  Biopsy was positive for a strongly ER/PR positive HER2 negative invasive mammary carcinoma.  She then underwent a wide local excision and sentinel node biopsy showing overall grade 2 invasive mammary carcinoma measuring 1.2 cm.  Margins were clear although close at less than 1 mm.  There was DCIS present although margins for that were clear also at 7 mm.  3 lymph nodes were examined 1 had macro metastatic disease with possible extracapsular extension.  She had a MammaPrint performed showing significant risk of recurrence and has undergone chemotherapy withdocetaxel and cyclophosphamide which she tolerated well.  She is seen today for radiation oncology opinion she is doing well she specifically denies breast tenderness cough or bone pain or any swelling in her upper extremities she has had bilateral breast reduction.  PLANNED TREATMENT REGIMEN: Right whole breast and peripheral lymphatic  radiation  PAST MEDICAL HISTORY:  has a past medical history of Anxiety, Complication of anesthesia, Coronary artery disease, Depression, Diabetes mellitus without complication ([place]), Diastolic dysfunction, Family history of breast cancer, History of kidney stones, Hyperlipemia (\[id number]\), Hypothyroidism, Kidney stone (08/2016), Mitral valve prolapse, MVP (mitral valve prolapse), Neuromuscular disorder ([place]), PONV (postoperative nausea and vomiting), STEMI (ST elevation myocardial infarction) ([place]) (12/02/2016), and Vitamin B 12 deficiency.    PAST SURGICAL HISTORY:  Past Surgical History:  Procedure Laterality Date  . ABDOMINAL HYSTERECTOMY  2003   TAH due to adenomyosis  . BREAST LUMPECTOMY WITH RADIOACTIVE SEED AND SENTINEL LYMPH NODE BIOPSY Right 10/04/2020   Procedure: RIGHT BREAST REDUCTION LUMPECTOMY WITH RADIOACTIVE SEED AND SENTINEL LYMPH NODE BIOPSY;  Surgeon: Jovita Kussmaul, MD;  Location: [facility];  Service: General;  Laterality: Right;  . BREAST REDUCTION SURGERY  1990  . BREAST REDUCTION SURGERY Bilateral 10/04/2020   Procedure: BILATERAL MAMMARY REDUCTION  (BREAST);  Surgeon: Wallace Going, DO;  Location: [facility];  Service: Plastics;  Laterality: Bilateral;  . CARDIAC CATHETERIZATION N/A 12/02/2016   Procedure: Coronary Stent Intervention;  Surgeon: Nelva Bush, MD;  Location: [place];  Service: Cardiovascular;  Laterality: N/A;  . CARDIAC CATHETERIZATION N/A 12/02/2016   Procedure: Left Heart Cath and Coronary Angiography;  Surgeon: Nelva Bush, MD;  Location: [facility];  Service: Cardiovascular;  Laterality: N/A;  . CARDIAC CATHETERIZATION N/A 12/02/2016   Procedure: Intravascular Ultrasound/IVUS;  Surgeon: Nelva Bush, MD;  Location: [place] [facility];  Service: Cardiovascular;  Laterality: N/A;  . CORONARY STENT INTERVENTION    . MOUTH SURGERY  Dental implant  . PORTACATH PLACEMENT Left  12/13/2020   Procedure: INSERTION PORT-A-CATH LEFT SUBCLAVIAN;  Surgeon: [name], MD;  Location: [place];  Service: General;  Laterality: Left;    FAMILY HISTORY: family history includes Atrial fibrillation in her mother; Breast cancer (age of onset: 67) in her maternal aunt; COPD in her father; Diabetes in her maternal grandfather and mother; Heart attack in her mother; Heart disease in her mother; Hyperlipidemia in her father and mother; Hypertension in her father and mother; Stroke in her mother.  SOCIAL HISTORY:  reports that she has never smoked. She has never used smokeless tobacco. She reports that she does not drink alcohol and does not use drugs.  ALLERGIES: Prednisone, Sulfa antibiotics, Sulfasalazine, and Sulfonamide derivatives  MEDICATIONS:  Current Outpatient Medications  Medication Sig Dispense Refill  . ALPRAZolam (XANAX) 0.25 MG tablet Take 0.25 mg by mouth 3 (three) times daily as needed for anxiety.    Francia Greaves THYROID 60 MG tablet Take 60 mg by mouth daily.    Marland Kitchen aspirin EC 81 MG tablet Take 81 mg by mouth daily.    Marland Kitchen atorvastatin (LIPITOR) 80 MG tablet TAKE 1 TABLET BY MOUTH DAILY AT 6PM GENERIC EQUIVALENT FOR LIPITOR 90 tablet 0  . buPROPion (WELLBUTRIN XL) 150 MG 24 hr tablet Take 300 mg by mouth daily.     . Cholecalciferol (VITAMIN D-3) 5000 UNITS TABS Take 1 tablet by mouth daily.    . clopidogrel (PLAVIX) 75 MG tablet TAKE 1 TABLET BY MOUTH DAILY GENERIC EQUIVALENT FOR PLAVIX 90 tablet 3  . Cyanocobalamin 1000 MCG/ML KIT Inject 1,000 mcg as directed once a week.    Marland Kitchen dexamethasone (DECADRON) 4 MG tablet Take 1 tablet (4 mg total) by mouth 2 (two) times daily. Start the day before Taxotere. Then 1 tab daily the day after chemo for 2 days. 30 tablet 1  . ezetimibe (ZETIA) 10 MG tablet Take 1 tablet (10 mg total) by mouth daily. 90 tablet 0  . FLUoxetine (PROZAC) 20 MG capsule Take 20 mg by mouth daily.     Marland Kitchen HYDROcodone-acetaminophen  (NORCO/VICODIN) 5-325 MG tablet Take 1-2 tablets by mouth every 6 (six) hours as needed for moderate pain or severe pain. 10 tablet 0  . levocetirizine (XYZAL) 5 MG tablet Take 1 tablet at bedtime by mouth.  2  . lidocaine-prilocaine (EMLA) cream Apply to affected area once 30 g 3  . liothyronine (CYTOMEL) 5 MCG tablet Take 5 mcg by mouth every morning.     . magnesium gluconate (MAGONATE) 500 MG tablet Take 1,000 mg by mouth at bedtime.    . metFORMIN (GLUCOPHAGE-XR) 500 MG 24 hr tablet Take 500 mg by mouth 2 (two) times daily.    . montelukast (SINGULAIR) 10 MG tablet Take 10 mg by mouth every morning.    . nitroGLYCERIN (NITROSTAT) 0.4 MG SL tablet Place 1 tablet (0.4 mg total) under the tongue every 5 (five) minutes as needed for chest pain. For maximum of 3 doses. 25 tablet 3  . ondansetron (ZOFRAN) 8 MG tablet Take 1 tablet (8 mg total) by mouth 2 (two) times daily as needed for refractory nausea / vomiting. Start on day 3 after chemo. 30 tablet 1  . prochlorperazine (COMPAZINE) 10 MG tablet Take 1 tablet (10 mg total) by mouth every 6 (six) hours as needed (Nausea or vomiting). 30 tablet 1  . valACYclovir (VALTREX) 500 MG tablet Take 1 tablet (500 mg total) by mouth daily.  90 tablet 1  . zinc gluconate 50 MG tablet Take 50 mg by mouth daily.    Marland Kitchen zolpidem (AMBIEN CR) 12.5 MG CR tablet Take 12.5 mg by mouth at bedtime as needed for sleep.    . Coenzyme Q10 (COQ10) 100 MG CAPS Take 100 mg by mouth daily. (Patient not taking: No sig reported)    . ketoconazole (NIZORAL) 2 % cream Apply 1 application topically daily. (Patient not taking: No sig reported) 15 g 0  . NALTREXONE HCL PO Take 4 mg by mouth at bedtime.  (Patient not taking: No sig reported)    . vitamin C (ASCORBIC ACID) 500 MG tablet Take 500 mg by mouth daily. (Patient not taking: No sig reported)     No current facility-administered medications for this encounter.    ECOG PERFORMANCE STATUS:  0 - Asymptomatic  REVIEW OF  SYSTEMS: Patient denies any weight loss, fatigue, weakness, fever, chills or night sweats. Patient denies any loss of vision, blurred vision. Patient denies any ringing  of the ears or hearing loss. No irregular heartbeat. Patient denies heart murmur or history of fainting. Patient denies any chest pain or pain radiating to her upper extremities. Patient denies any shortness of breath, difficulty breathing at night, cough or hemoptysis. Patient denies any swelling in the lower legs. Patient denies any nausea vomiting, vomiting of blood, or coffee ground material in the vomitus. Patient denies any stomach pain. Patient states has had normal bowel movements no significant constipation or diarrhea. Patient denies any dysuria, hematuria or significant nocturia. Patient denies any problems walking, swelling in the joints or loss of balance. Patient denies any skin changes, loss of hair or loss of weight. Patient denies any excessive worrying or anxiety or significant depression. Patient denies any problems with insomnia. Patient denies excessive thirst, polyuria, polydipsia. Patient denies any swollen glands, patient denies easy bruising or easy bleeding. Patient denies any recent infections, allergies or URI. Patient "s visual fields have not changed significantly in recent time.   PHYSICAL EXAM: BP 134/73   Pulse (!) 104   Temp (!) 97.5 F (36.4 C)   Resp 16   Wt 182 lb 8 oz (82.8 kg)   SpO2 98%   BMI 32.33 kg/m  Patient is status post bilateral breast reduction no dominant masses noted in either breast.  No axillary or supraclavicular adenopathy is appreciated well-developed well-nourished patient in NAD. HEENT reveals PERLA, EOMI, discs not visualized.  Oral cavity is clear. No oral mucosal lesions are identified. Neck is clear without evidence of cervical or supraclavicular adenopathy. Lungs are clear to A&P. Cardiac examination is essentially unremarkable with regular rate and rhythm without murmur rub  or thrill. Abdomen is benign with no organomegaly or masses noted. Motor sensory and DTR levels are equal and symmetric in the upper and lower extremities. Cranial nerves II through XII are grossly intact. Proprioception is intact. No peripheral adenopathy or edema is identified. No motor or sensory levels are noted. Crude visual fields are within normal range.  LABORATORY DATA: Pathology report reviewed    RADIOLOGY RESULTS: Mammogram ultrasound reviewed compatible with above-stated findings   IMPRESSION: Stage Ib ER/PR positive invasive mammary carcinoma of the right breast status post wide local excision and sentinel node biopsy and adjuvant chemotherapy in 60 year old female  PLAN: At this time elect to go ahead with right whole breast radiation as well as her peripheral lymphatics based on the limited lymph node dissection and node with extracapsular extension.  Would plan  on delivering 5040 cGy over 5 weeks.  Also will try to boost her scar based on the close margin another 1400 cGy using electron beam therapy.  Risks and benefits of treatment including skin reaction fatigue alteration of blood counts possible inclusion of superficial lung and minimal chance of lymphedema in her right upper extremity all were described in detail to the patient.  I have personally set up and ordered CT simulation for early next week.  Patient also will be a candidate for antiestrogen therapy after completion of radiation.  Patient and daughter both comprehend my recommendations well.  I would like to take this opportunity to thank you for allowing me to participate in the care of your patient.Noreene Filbert, MD

## 2021-03-07 ENCOUNTER — Ambulatory Visit
Admission: RE | Admit: 2021-03-07 | Discharge: 2021-03-07 | Disposition: A | Discharge status: Home / Self Care | Payer: 59 | Source: Ambulatory Visit | Attending: Radiation Oncology | Admitting: Radiation Oncology

## 2021-03-07 DIAGNOSIS — Z510 Encounter for antineoplastic radiation therapy: Secondary | ICD-10-CM | POA: Insufficient documentation

## 2021-03-07 DIAGNOSIS — C50411 Malignant neoplasm of upper-outer quadrant of right female breast: Secondary | ICD-10-CM | POA: Insufficient documentation

## 2021-03-08 ENCOUNTER — Encounter: Payer: Self-pay | Admitting: *Deleted

## 2021-03-08 ENCOUNTER — Encounter: Payer: Self-pay | Admitting: Hematology

## 2021-03-09 ENCOUNTER — Ambulatory Visit: Payer: 59 | Admitting: Internal Medicine

## 2021-03-11 ENCOUNTER — Other Ambulatory Visit: Payer: Self-pay | Admitting: *Deleted

## 2021-03-11 DIAGNOSIS — C50411 Malignant neoplasm of upper-outer quadrant of right female breast: Secondary | ICD-10-CM

## 2021-03-11 DIAGNOSIS — Z170 Estrogen receptor positive status [ER+]: Secondary | ICD-10-CM

## 2021-03-11 DIAGNOSIS — Z510 Encounter for antineoplastic radiation therapy: Secondary | ICD-10-CM | POA: Diagnosis not present

## 2021-03-14 ENCOUNTER — Ambulatory Visit: Admission: RE | Admit: 2021-03-14 | Payer: 59 | Source: Ambulatory Visit

## 2021-03-14 DIAGNOSIS — Z510 Encounter for antineoplastic radiation therapy: Secondary | ICD-10-CM | POA: Diagnosis not present

## 2021-03-15 ENCOUNTER — Ambulatory Visit
Admission: RE | Admit: 2021-03-15 | Discharge: 2021-03-15 | Disposition: A | Discharge status: Home / Self Care | Payer: 59 | Source: Ambulatory Visit | Attending: Radiation Oncology | Admitting: Radiation Oncology

## 2021-03-15 DIAGNOSIS — Z510 Encounter for antineoplastic radiation therapy: Secondary | ICD-10-CM | POA: Diagnosis not present

## 2021-03-16 ENCOUNTER — Ambulatory Visit
Admission: RE | Admit: 2021-03-16 | Discharge: 2021-03-16 | Disposition: A | Discharge status: Home / Self Care | Payer: 59 | Source: Ambulatory Visit | Attending: Radiation Oncology | Admitting: Radiation Oncology

## 2021-03-16 DIAGNOSIS — Z510 Encounter for antineoplastic radiation therapy: Secondary | ICD-10-CM | POA: Diagnosis not present

## 2021-03-17 ENCOUNTER — Ambulatory Visit
Admission: RE | Admit: 2021-03-17 | Discharge: 2021-03-17 | Disposition: A | Discharge status: Home / Self Care | Payer: 59 | Source: Ambulatory Visit | Attending: Radiation Oncology | Admitting: Radiation Oncology

## 2021-03-17 DIAGNOSIS — Z510 Encounter for antineoplastic radiation therapy: Secondary | ICD-10-CM | POA: Diagnosis not present

## 2021-03-18 ENCOUNTER — Ambulatory Visit
Admission: RE | Admit: 2021-03-18 | Discharge: 2021-03-18 | Disposition: A | Discharge status: Home / Self Care | Payer: 59 | Source: Ambulatory Visit | Attending: Radiation Oncology | Admitting: Radiation Oncology

## 2021-03-18 DIAGNOSIS — Z510 Encounter for antineoplastic radiation therapy: Secondary | ICD-10-CM | POA: Diagnosis not present

## 2021-03-19 ENCOUNTER — Other Ambulatory Visit: Payer: Self-pay | Admitting: Internal Medicine

## 2021-03-21 ENCOUNTER — Ambulatory Visit
Admission: RE | Admit: 2021-03-21 | Discharge: 2021-03-21 | Disposition: A | Discharge status: Home / Self Care | Payer: 59 | Source: Ambulatory Visit | Attending: Radiation Oncology | Admitting: Radiation Oncology

## 2021-03-21 DIAGNOSIS — Z510 Encounter for antineoplastic radiation therapy: Secondary | ICD-10-CM | POA: Diagnosis not present

## 2021-03-21 NOTE — Telephone Encounter (Signed)
Rx request sent to pharmacy.  

## 2021-03-22 ENCOUNTER — Ambulatory Visit
Admission: RE | Admit: 2021-03-22 | Discharge: 2021-03-22 | Disposition: A | Discharge status: Home / Self Care | Payer: 59 | Source: Ambulatory Visit | Attending: Radiation Oncology | Admitting: Radiation Oncology

## 2021-03-22 DIAGNOSIS — Z510 Encounter for antineoplastic radiation therapy: Secondary | ICD-10-CM | POA: Diagnosis not present

## 2021-03-23 ENCOUNTER — Ambulatory Visit
Admission: RE | Admit: 2021-03-23 | Discharge: 2021-03-23 | Disposition: A | Discharge status: Home / Self Care | Payer: 59 | Source: Ambulatory Visit | Attending: Radiation Oncology | Admitting: Radiation Oncology

## 2021-03-23 DIAGNOSIS — Z510 Encounter for antineoplastic radiation therapy: Secondary | ICD-10-CM | POA: Diagnosis not present

## 2021-03-24 ENCOUNTER — Ambulatory Visit
Admission: RE | Admit: 2021-03-24 | Discharge: 2021-03-24 | Disposition: A | Discharge status: Home / Self Care | Payer: 59 | Source: Ambulatory Visit | Attending: Radiation Oncology | Admitting: Radiation Oncology

## 2021-03-24 DIAGNOSIS — Z510 Encounter for antineoplastic radiation therapy: Secondary | ICD-10-CM | POA: Diagnosis not present

## 2021-03-25 ENCOUNTER — Ambulatory Visit
Admission: RE | Admit: 2021-03-25 | Discharge: 2021-03-25 | Disposition: A | Discharge status: Home / Self Care | Payer: 59 | Source: Ambulatory Visit | Attending: Radiation Oncology | Admitting: Radiation Oncology

## 2021-03-25 DIAGNOSIS — Z510 Encounter for antineoplastic radiation therapy: Secondary | ICD-10-CM | POA: Diagnosis not present

## 2021-03-28 ENCOUNTER — Ambulatory Visit
Admission: RE | Admit: 2021-03-28 | Discharge: 2021-03-28 | Disposition: A | Discharge status: Home / Self Care | Payer: 59 | Source: Ambulatory Visit | Attending: Radiation Oncology | Admitting: Radiation Oncology

## 2021-03-28 DIAGNOSIS — Z510 Encounter for antineoplastic radiation therapy: Secondary | ICD-10-CM | POA: Diagnosis not present

## 2021-03-29 ENCOUNTER — Ambulatory Visit
Admission: RE | Admit: 2021-03-29 | Discharge: 2021-03-29 | Disposition: A | Discharge status: Home / Self Care | Payer: 59 | Source: Ambulatory Visit | Attending: Radiation Oncology | Admitting: Radiation Oncology

## 2021-03-29 ENCOUNTER — Other Ambulatory Visit: Payer: Self-pay

## 2021-03-29 ENCOUNTER — Ambulatory Visit (INDEPENDENT_AMBULATORY_CARE_PROVIDER_SITE_OTHER): Payer: 59

## 2021-03-29 DIAGNOSIS — R0602 Shortness of breath: Secondary | ICD-10-CM

## 2021-03-29 DIAGNOSIS — Z510 Encounter for antineoplastic radiation therapy: Secondary | ICD-10-CM | POA: Diagnosis not present

## 2021-03-30 ENCOUNTER — Other Ambulatory Visit: Payer: 59

## 2021-03-30 ENCOUNTER — Ambulatory Visit
Admission: RE | Admit: 2021-03-30 | Discharge: 2021-03-30 | Disposition: A | Discharge status: Home / Self Care | Payer: 59 | Source: Ambulatory Visit | Attending: Radiation Oncology | Admitting: Radiation Oncology

## 2021-03-30 DIAGNOSIS — Z510 Encounter for antineoplastic radiation therapy: Secondary | ICD-10-CM | POA: Diagnosis not present

## 2021-03-30 LAB — ECHOCARDIOGRAM LIMITED
AR max vel: 2.03 cm2
AV Area VTI: 1.96 cm2
AV Area mean vel: 1.97 cm2
AV Mean grad: 4 mmHg
AV Peak grad: 6.8 mmHg
Ao pk vel: 1.3 m/s
Area-P 1/2: 3.83 cm2
Calc EF: 69.1 %
S' Lateral: 2.7 cm
Single Plane A2C EF: 63.2 %
Single Plane A4C EF: 68.8 %

## 2021-03-31 ENCOUNTER — Inpatient Hospital Stay: Payer: 59 | Attending: Radiation Oncology

## 2021-03-31 ENCOUNTER — Ambulatory Visit
Admission: RE | Admit: 2021-03-31 | Discharge: 2021-03-31 | Disposition: A | Discharge status: Home / Self Care | Payer: 59 | Source: Ambulatory Visit | Attending: Radiation Oncology | Admitting: Radiation Oncology

## 2021-03-31 ENCOUNTER — Telehealth: Payer: Self-pay

## 2021-03-31 DIAGNOSIS — C50411 Malignant neoplasm of upper-outer quadrant of right female breast: Secondary | ICD-10-CM | POA: Insufficient documentation

## 2021-03-31 DIAGNOSIS — Z510 Encounter for antineoplastic radiation therapy: Secondary | ICD-10-CM | POA: Diagnosis not present

## 2021-03-31 DIAGNOSIS — Z170 Estrogen receptor positive status [ER+]: Secondary | ICD-10-CM

## 2021-03-31 LAB — CBC
HCT: 36.3 % (ref 36.0–46.0)
Hemoglobin: 11.8 g/dL — ABNORMAL LOW (ref 12.0–15.0)
MCH: 31.3 pg (ref 26.0–34.0)
MCHC: 32.5 g/dL (ref 30.0–36.0)
MCV: 96.3 fL (ref 80.0–100.0)
Platelets: 259 10*3/uL (ref 150–400)
RBC: 3.77 MIL/uL — ABNORMAL LOW (ref 3.87–5.11)
RDW: 15.9 % — ABNORMAL HIGH (ref 11.5–15.5)
WBC: 4.6 10*3/uL (ref 4.0–10.5)
nRBC: 0 % (ref 0.0–0.2)

## 2021-03-31 NOTE — Telephone Encounter (Signed)
The patient has been notified of the result and verbalized understanding.  All questions (if any) were answered. Janan Ridge, [place] 03/31/2021 3:49 PM

## 2021-03-31 NOTE — Telephone Encounter (Signed)
Cassandra Cox, [place]  03/31/2021 1:07 PM EDT Back to Top     Attempted to contact patient with results.  No answer. LMOV to call back.  Phone note started.    Nelva Bush, MD  03/31/2021 12:41 PM EDT      Please let Ms. Duty know that her echocardiogram shows that her heart is still contracting normally. She should follow-up as scheduled to reassess her symptoms and determine if additional testing is needed.

## 2021-04-01 ENCOUNTER — Ambulatory Visit
Admission: RE | Admit: 2021-04-01 | Discharge: 2021-04-01 | Disposition: A | Discharge status: Home / Self Care | Payer: 59 | Source: Ambulatory Visit | Attending: Radiation Oncology | Admitting: Radiation Oncology

## 2021-04-01 ENCOUNTER — Ambulatory Visit: Payer: 59 | Admitting: Physician Assistant

## 2021-04-01 DIAGNOSIS — Z510 Encounter for antineoplastic radiation therapy: Secondary | ICD-10-CM | POA: Diagnosis not present

## 2021-04-05 ENCOUNTER — Ambulatory Visit
Admission: RE | Admit: 2021-04-05 | Discharge: 2021-04-05 | Disposition: A | Discharge status: Home / Self Care | Payer: 59 | Source: Ambulatory Visit | Attending: Radiation Oncology | Admitting: Radiation Oncology

## 2021-04-05 DIAGNOSIS — Z510 Encounter for antineoplastic radiation therapy: Secondary | ICD-10-CM | POA: Diagnosis not present

## 2021-04-06 ENCOUNTER — Ambulatory Visit
Admission: RE | Admit: 2021-04-06 | Discharge: 2021-04-06 | Disposition: A | Discharge status: Home / Self Care | Payer: 59 | Source: Ambulatory Visit | Attending: Radiation Oncology | Admitting: Radiation Oncology

## 2021-04-06 DIAGNOSIS — Z510 Encounter for antineoplastic radiation therapy: Secondary | ICD-10-CM | POA: Insufficient documentation

## 2021-04-06 DIAGNOSIS — Z170 Estrogen receptor positive status [ER+]: Secondary | ICD-10-CM | POA: Insufficient documentation

## 2021-04-06 DIAGNOSIS — C50411 Malignant neoplasm of upper-outer quadrant of right female breast: Secondary | ICD-10-CM | POA: Insufficient documentation

## 2021-04-07 ENCOUNTER — Ambulatory Visit
Admission: RE | Admit: 2021-04-07 | Discharge: 2021-04-07 | Disposition: A | Discharge status: Home / Self Care | Payer: 59 | Source: Ambulatory Visit | Attending: Radiation Oncology | Admitting: Radiation Oncology

## 2021-04-07 DIAGNOSIS — Z510 Encounter for antineoplastic radiation therapy: Secondary | ICD-10-CM | POA: Diagnosis not present

## 2021-04-08 ENCOUNTER — Ambulatory Visit
Admission: RE | Admit: 2021-04-08 | Discharge: 2021-04-08 | Disposition: A | Discharge status: Home / Self Care | Payer: 59 | Source: Ambulatory Visit | Attending: Radiation Oncology | Admitting: Radiation Oncology

## 2021-04-08 DIAGNOSIS — Z510 Encounter for antineoplastic radiation therapy: Secondary | ICD-10-CM | POA: Diagnosis not present

## 2021-04-11 ENCOUNTER — Ambulatory Visit
Admission: RE | Admit: 2021-04-11 | Discharge: 2021-04-11 | Disposition: A | Discharge status: Home / Self Care | Payer: 59 | Source: Ambulatory Visit | Attending: Radiation Oncology | Admitting: Radiation Oncology

## 2021-04-11 DIAGNOSIS — Z510 Encounter for antineoplastic radiation therapy: Secondary | ICD-10-CM | POA: Diagnosis not present

## 2021-04-12 ENCOUNTER — Ambulatory Visit
Admission: RE | Admit: 2021-04-12 | Discharge: 2021-04-12 | Disposition: A | Discharge status: Home / Self Care | Payer: 59 | Source: Ambulatory Visit | Attending: Radiation Oncology | Admitting: Radiation Oncology

## 2021-04-12 DIAGNOSIS — Z510 Encounter for antineoplastic radiation therapy: Secondary | ICD-10-CM | POA: Diagnosis not present

## 2021-04-13 ENCOUNTER — Ambulatory Visit
Admission: RE | Admit: 2021-04-13 | Discharge: 2021-04-13 | Disposition: A | Discharge status: Home / Self Care | Payer: 59 | Source: Ambulatory Visit | Attending: Radiation Oncology | Admitting: Radiation Oncology

## 2021-04-13 ENCOUNTER — Other Ambulatory Visit: Payer: 59

## 2021-04-13 DIAGNOSIS — Z510 Encounter for antineoplastic radiation therapy: Secondary | ICD-10-CM | POA: Diagnosis not present

## 2021-04-14 ENCOUNTER — Inpatient Hospital Stay: Payer: 59 | Attending: Radiation Oncology

## 2021-04-14 ENCOUNTER — Ambulatory Visit
Admission: RE | Admit: 2021-04-14 | Discharge: 2021-04-14 | Disposition: A | Discharge status: Home / Self Care | Payer: 59 | Source: Ambulatory Visit | Attending: Radiation Oncology | Admitting: Radiation Oncology

## 2021-04-14 DIAGNOSIS — Z510 Encounter for antineoplastic radiation therapy: Secondary | ICD-10-CM | POA: Diagnosis not present

## 2021-04-14 DIAGNOSIS — Z170 Estrogen receptor positive status [ER+]: Secondary | ICD-10-CM

## 2021-04-14 DIAGNOSIS — C50411 Malignant neoplasm of upper-outer quadrant of right female breast: Secondary | ICD-10-CM

## 2021-04-14 LAB — CBC
HCT: 39 % (ref 36.0–46.0)
Hemoglobin: 12.8 g/dL (ref 12.0–15.0)
MCH: 31.1 pg (ref 26.0–34.0)
MCHC: 32.8 g/dL (ref 30.0–36.0)
MCV: 94.7 fL (ref 80.0–100.0)
Platelets: 280 10*3/uL (ref 150–400)
RBC: 4.12 MIL/uL (ref 3.87–5.11)
RDW: 14.7 % (ref 11.5–15.5)
WBC: 4.4 10*3/uL (ref 4.0–10.5)
nRBC: 0 % (ref 0.0–0.2)

## 2021-04-15 ENCOUNTER — Ambulatory Visit
Admission: RE | Admit: 2021-04-15 | Discharge: 2021-04-15 | Disposition: A | Discharge status: Home / Self Care | Payer: 59 | Source: Ambulatory Visit | Attending: Radiation Oncology | Admitting: Radiation Oncology

## 2021-04-15 DIAGNOSIS — Z510 Encounter for antineoplastic radiation therapy: Secondary | ICD-10-CM | POA: Diagnosis not present

## 2021-04-18 ENCOUNTER — Ambulatory Visit
Admission: RE | Admit: 2021-04-18 | Discharge: 2021-04-18 | Disposition: A | Discharge status: Home / Self Care | Payer: 59 | Source: Ambulatory Visit | Attending: Radiation Oncology | Admitting: Radiation Oncology

## 2021-04-18 DIAGNOSIS — Z510 Encounter for antineoplastic radiation therapy: Secondary | ICD-10-CM | POA: Diagnosis not present

## 2021-04-19 ENCOUNTER — Ambulatory Visit
Admission: RE | Admit: 2021-04-19 | Discharge: 2021-04-19 | Disposition: A | Discharge status: Home / Self Care | Payer: 59 | Source: Ambulatory Visit | Attending: Radiation Oncology | Admitting: Radiation Oncology

## 2021-04-19 DIAGNOSIS — Z510 Encounter for antineoplastic radiation therapy: Secondary | ICD-10-CM | POA: Diagnosis not present

## 2021-04-20 ENCOUNTER — Ambulatory Visit
Admission: RE | Admit: 2021-04-20 | Discharge: 2021-04-20 | Disposition: A | Discharge status: Home / Self Care | Payer: 59 | Source: Ambulatory Visit | Attending: Radiation Oncology | Admitting: Radiation Oncology

## 2021-04-20 DIAGNOSIS — Z510 Encounter for antineoplastic radiation therapy: Secondary | ICD-10-CM | POA: Diagnosis not present

## 2021-04-20 NOTE — Progress Notes (Deleted)
Cardiology Office Note    Date:  04/20/2021   ID:  Cassandra Cox, DOB March 04, 1961, MRN [id number]  PCP:  Pcp, No  Cardiologist:  Nelva Bush, MD  Electrophysiologist:  None   Chief Complaint: Follow-up  History of Present Illness:   Cassandra Cox is a 60 y.o. female with history of CAD with anterior ST elevation MI in 11/2016 status post PCI/DES to the LAD, pSVT, right-sided breast cancer status post wide local excision and adjuvant chemotherapy and radiation, HLD, and anxiety who presents for follow-up of her CAD.   She was admitted to the hospital in 11/2016 with anterior ST elevation MI.  Emergent LHC showed ostial LAD 30% stenosis, mid LAD 99% stenosis, LCx and RCA without significant disease.  She underwent successful IVUS guided PCI to the mid LAD.  Echo at that time showed an EF of 55 to 64%, grade 2 diastolic dysfunction, left atrial enlargement, normal RV systolic function and ventricular cavity size, and no significant valvular abnormalities.  She was admitted to the hospital in 09/2017 with chest pain and ruled out.  Lexiscan MPI at that time was negative for significant ischemia or scar with an LVEF of 72%.  2D echo showed an EF of 55 to 60%, normal wall motion, grade 2 diastolic dysfunction with elevated filling pressure, normal RV systolic function and ventricular cavity size, and no significant valvular abnormalities.  She was seen in the office in 02/2020 noting a little more dyspnea with activity and at times she was inadvertently holding her breath.  She attributed some of her symptoms to deconditioning and being less active over the preceding several months.  She also noted occasional brief palpitations.  In the setting of intolerance to several beta-blockers in the past, pharmacotherapy was deferred at that time.  She was seen in the office in 05/2020 and was doing reasonably well, continuing to note some exertional dyspnea, which was largely unchanged when compared to her  prior visit as well as some palpitations.  Subsequent echo showed an EF of 60-65%, no RWMA, Gr2DD, normal RVSF and ventricular cavity size, and mild tricuspid regurgitation. Overall, this study was largely unchanged from prior. Zio patch showed a predominant rhythm of sinus with an average heart rate of 87 bpm (range 64-146 bpm in sinus), rare PACs/PVCs, single 5-beat atrial run with a maximum rate of 164 bpm.  There were no sustained arrhythmias or patient triggered events.   She was last seen in the office in 01/2021 and was doing relatively well.  She noted some exertional dyspnea and fatigue that began with chemotherapy as well as chest discomfort after receiving her first dose of colony-stimulating factor status postchemotherapy.  She contacted the cancer center and was told this was a normal reaction.  She had subsequently been treated with analgesics and IV fluids around colony-stimulating factor therapy without further chest pain.  Echo on 03/29/2021 showed an EF of 55 to 60%, no regional wall motion abnormalities, grade 1 diastolic dysfunction, normal RV systolic function and ventricular cavity size, normal PASP, trivial mitral regurgitation, and an estimated right atrial pressure of 3 mmHg.  ***   Labs independently reviewed: 04/2021 - Hgb 12.8, PLT 280 02/2021 - potassium 4.0, BUN 19, serum creatinine 0.69, albumin 3.9, AST/ALT normal 01/2021 - TC 153, TG 71, HDL 63, LDL 76 08/2019 - A1c 6.1  Past Medical History:  Diagnosis Date   Anxiety    Complication of anesthesia    Coronary artery disease  a. 11/2016 Cath/PCI: LM nl, LAD 30ost, 53m(3.0x18 Resolute Integrity DES--3.75), LCX nl, RCA nl; b. MV 11/18: negative for ischemia, EF 72%, low risk   Depression    Diabetes mellitus without complication ([facility]    type 2   Diastolic dysfunction    a. 11/2016 Echo: EF 55-60%, Gr2 DD, LAE, nl RV size/fxn.   Family history of breast cancer    History of kidney stones    Hyperlipemia \\[id number]\   Hypothyroidism    Kidney stone 08/2016   Mitral valve prolapse    MVP (mitral valve prolapse)    Neuromuscular disorder (HCC)    bilateral feet   PONV (postoperative nausea and vomiting)    STEMI (ST elevation myocardial infarction) ([facility] 12/02/2016   Vitamin B 12 deficiency     Past Surgical History:  Procedure Laterality Date   ABDOMINAL HYSTERECTOMY  2003   TAH due to adenomyosis   BREAST LUMPECTOMY WITH RADIOACTIVE SEED AND SENTINEL LYMPH NODE BIOPSY Right 10/04/2020   Procedure: RIGHT BREAST REDUCTION LUMPECTOMY WITH RADIOACTIVE SEED AND SENTINEL LYMPH NODE BIOPSY;  Surgeon: TJovita Kussmaul MD;  [facility]  Service: General;  Laterality: Right;   BREAST REDUCTION SURGERY  1990   BREAST REDUCTION SURGERY Bilateral 10/04/2020   Procedure: BILATERAL MAMMARY REDUCTION  (BREAST);  Surgeon: DWallace Going DO;  [facility]  Service: Plastics;  Laterality: Bilateral;   CARDIAC CATHETERIZATION N/A 12/02/2016   Procedure: Coronary Stent Intervention;  Surgeon: CNelva Bush MD;  [facility];  Service: Cardiovascular;  Laterality: N/A;   CARDIAC CATHETERIZATION N/A 12/02/2016   Procedure: Left Heart Cath and Coronary Angiography;  Surgeon: CNelva Bush MD;  [facility];  Service: Cardiovascular;  Laterality: N/A;   CARDIAC CATHETERIZATION N/A 12/02/2016   Procedure: Intravascular Ultrasound/IVUS;  Surgeon: CNelva Bush MD;  [facility];  Service: Cardiovascular;  Laterality: N/A;   CORONARY STENT INTERVENTION     MOUTH SURGERY     Dental implant   PORTACATH PLACEMENT Left 12/13/2020   Procedure: INSERTION PORT-A-CATH LEFT SUBCLAVIAN;  Surgeon: TJovita Kussmaul MD;  [facility]  Service: General;  Laterality: Left;    Current Medications: No outpatient medications have been marked as taking for the 04/26/21 encounter (Appointment) with DRise Mu PA-C.    Allergies:   Prednisone, Sulfa antibiotics, Sulfasalazine, and Sulfonamide derivatives   Social History   Socioeconomic History   Marital status: Divorced    Spouse name: Not on file   Number of children: Not on file   Years of education: Not on file   Highest education level: Not on file  Occupational History    Employer: [employer]  Tobacco Use   Smoking status: Never   Smokeless tobacco: Never  Vaping Use   Vaping Use: Never used  Substance and Sexual Activity   Alcohol use: No   Drug use: No   Sexual activity: Yes    Birth control/protection: Surgical  Other Topics Concern   Not on file  Social History Narrative   Not on file   Social Determinants of Health   Financial Resource Strain: Not on file  Food Insecurity: Not on file  Transportation Needs: Not on file  Physical Activity: Not on file  Stress: Not on file  Social Connections: Not on file     Family History:  The patient's family history includes Atrial fibrillation in her  mother; Breast cancer (age of onset: 4) in her maternal aunt; COPD in her father; Diabetes in her maternal grandfather and mother; Heart attack in her mother; Heart disease in her mother; Hyperlipidemia in her father and mother; Hypertension in her father and mother; Stroke in her mother.  ROS:   ROS   EKGs/Labs/Other Studies Reviewed:    Studies reviewed were summarized above. The additional studies were reviewed today:  2D echo 03/29/2021: 1. Left ventricular ejection fraction, by estimation, is 55 to 60%. The  left ventricle has normal function. The left ventricle has no regional  wall motion abnormalities. Left ventricular diastolic parameters are  consistent with Grade I diastolic  dysfunction (impaired relaxation).   2. Right ventricular systolic function is normal. The right ventricular  size is normal. There is normal pulmonary artery systolic pressure.   3. The mitral valve is normal in  structure. Trivial mitral valve  regurgitation. No evidence of mitral stenosis.   4. The aortic valve is tricuspid. Aortic valve regurgitation is not  visualized. No aortic stenosis is present.   5. The inferior vena cava is normal in size with greater than 50%  respiratory variability, suggesting right atrial pressure of 3 mmHg.   Comparison(s): EF 60-65%, GLS not reported. __________  2D echo 06/23/2020: 1. Left ventricular ejection fraction, by estimation, is 60 to 65%. The  left ventricle has normal function. The left ventricle has no regional  wall motion abnormalities. Left ventricular diastolic parameters are  consistent with Grade II diastolic  dysfunction (pseudonormalization).   2. Right ventricular systolic function is normal. The right ventricular  size is normal. Tricuspid regurgitation signal is inadequate for assessing  PA pressure.  __________  Elwyn Reach patch 05/2020:  The patient was monitored for 8 days, 5 hours. The predominant rhythm was sinus with an average rate of 87 bpm (range 64-146 bpm in sinus). There were rare PAC's and PVC's. A single 5 beat atrial run occurred with a maximum rate of 164 bpm. No sustained arrhythmia or prolonged pause was observerd. There were no patient triggered events.   Predominantly sinus rhythm with rare PAC's and PVC's, as well as a single brief atrial run.  No significant arrhythmia observed. __________  2D echo 09/14/2017: - Left ventricle: The cavity size was normal. Systolic function was    normal. The estimated ejection fraction was in the range of 55%    to 60%. Wall motion was normal; there were no regional wall    motion abnormalities. Features are consistent with a pseudonormal    left ventricular filling pattern, with concomitant abnormal    relaxation and increased filling pressure (grade 2 diastolic    dysfunction).  - Left atrium: The atrium was normal in size.  - Right ventricle: Systolic function was normal.  -  Pulmonary arteries: Systolic pressure was within the normal    range.  __________  2D echo 12/03/2016: - Left ventricle: There was mild concentric hypertrophy. Systolic    function was normal. The estimated ejection fraction was in the    range of 55% to 60%. Features are consistent with a pseudonormal    left ventricular filling pattern, with concomitant abnormal    relaxation and increased filling pressure (grade 2 diastolic    dysfunction). Doppler parameters are consistent with    indeterminate ventricular filling pressure.  - Aortic valve: Transvalvular velocity was within the normal range.    There was no stenosis. There was no regurgitation. Valve area    (Vmax): 1.85  cm^2.  - Mitral valve: Transvalvular velocity was within the normal range.    There was no evidence for stenosis. There was no regurgitation.  - Left atrium: The atrium was severely dilated.  - Right ventricle: The cavity size was normal. Wall thickness was    normal. Systolic function was normal.  - Tricuspid valve: There was no regurgitation.  - Pulmonary arteries: Systolic pressure was within the normal    range. PA peak pressure: 26 mm Hg (S). __________  [facility] 12/02/2016: Conclusions: Anterior STEMI with thrombotic occlusion of the mid LAD, likely due to acute plaque rupture. Moderate diffuse atherosclerotic plaquing of the proximal and mid LAD by IVUS. Mildly elevated left ventricular filling pressure. Successful IVUS-guided PCI to the mid LAD with placement of an Integrity Resolute 3.0 x 18 mm drug-eluting stent, post-dilated in the proximal and mid portions with 3.75 mm noncompliant balloon.   Recommendations: Admit to ICU on hospitalist service. TR band protocol. Continue tirofiban x 4 hours. Dual antiplatelet therapy with aspirin and ticagrelor for at least 12 months. Aggressive secondary prevention, including high-intensity statin therapy. Echocardiogram to evaluate LV function.    EKG:  EKG is  ordered today.  The EKG ordered today demonstrates ***  Recent Labs: 02/15/2021: ALT 39; BUN 19; Creatinine 0.69; Potassium 4.0; Sodium 137 04/14/2021: Hemoglobin 12.8; Platelets 280  Recent Lipid Panel    Component Value Date/Time   CHOL 153 01/27/2021 1122   TRIG 71 01/27/2021 1122   HDL 63 01/27/2021 1122   CHOLHDL 2.5 03/03/2020 1052   CHOLHDL 2.3 09/14/2017 0536   VLDL 9 09/14/2017 0536   LDLCALC 76 01/27/2021 1122   LDLDIRECT 142.2 12/04/2013 1516    PHYSICAL EXAM:    VS:  There were no vitals taken for this visit.  BMI: There is no height or weight on file to calculate BMI.  Physical Exam  Wt Readings from Last 3 Encounters:  03/03/21 182 lb 8 oz (82.8 kg)  02/23/21 184 lb 6 oz (83.6 kg)  02/15/21 183 lb (83 kg)     ASSESSMENT & PLAN:   CAD involving the native coronary arteries without***angina:  Paroxysmal SVT:  HLD: LDL 76 in 01/2021 with goal being less than 70.  Breast cancer:  Disposition: F/u with Dr. Saunders Revel or an APP in ***.   Medication Adjustments/Labs and Tests Ordered: Current medicines are reviewed at length with the patient today.  Concerns regarding medicines are outlined above. Medication changes, Labs and Tests ordered today are summarized above and listed in the Patient Instructions accessible in Encounters.   Signed, Christell Faith, PA-C 04/20/2021 12:21 PM     [address] [place], [place] [address] [phone]

## 2021-04-21 ENCOUNTER — Ambulatory Visit
Admission: RE | Admit: 2021-04-21 | Discharge: 2021-04-21 | Disposition: A | Discharge status: Home / Self Care | Payer: 59 | Source: Ambulatory Visit | Attending: Radiation Oncology | Admitting: Radiation Oncology

## 2021-04-21 DIAGNOSIS — Z510 Encounter for antineoplastic radiation therapy: Secondary | ICD-10-CM | POA: Diagnosis not present

## 2021-04-22 ENCOUNTER — Ambulatory Visit
Admission: RE | Admit: 2021-04-22 | Discharge: 2021-04-22 | Disposition: A | Discharge status: Home / Self Care | Payer: 59 | Source: Ambulatory Visit | Attending: Radiation Oncology | Admitting: Radiation Oncology

## 2021-04-22 DIAGNOSIS — Z510 Encounter for antineoplastic radiation therapy: Secondary | ICD-10-CM | POA: Diagnosis not present

## 2021-04-25 ENCOUNTER — Ambulatory Visit
Admission: RE | Admit: 2021-04-25 | Discharge: 2021-04-25 | Disposition: A | Discharge status: Home / Self Care | Payer: 59 | Source: Ambulatory Visit | Attending: Radiation Oncology | Admitting: Radiation Oncology

## 2021-04-25 DIAGNOSIS — Z510 Encounter for antineoplastic radiation therapy: Secondary | ICD-10-CM | POA: Diagnosis not present

## 2021-04-26 ENCOUNTER — Ambulatory Visit: Payer: 59 | Admitting: Physician Assistant

## 2021-04-26 ENCOUNTER — Ambulatory Visit
Admission: RE | Admit: 2021-04-26 | Discharge: 2021-04-26 | Disposition: A | Discharge status: Home / Self Care | Payer: 59 | Source: Ambulatory Visit | Attending: Radiation Oncology | Admitting: Radiation Oncology

## 2021-04-26 DIAGNOSIS — Z510 Encounter for antineoplastic radiation therapy: Secondary | ICD-10-CM | POA: Diagnosis not present

## 2021-04-27 ENCOUNTER — Ambulatory Visit
Admission: RE | Admit: 2021-04-27 | Discharge: 2021-04-27 | Disposition: A | Discharge status: Home / Self Care | Payer: 59 | Source: Ambulatory Visit | Attending: Radiation Oncology | Admitting: Radiation Oncology

## 2021-04-27 DIAGNOSIS — Z510 Encounter for antineoplastic radiation therapy: Secondary | ICD-10-CM | POA: Diagnosis not present

## 2021-04-28 ENCOUNTER — Inpatient Hospital Stay: Payer: 59

## 2021-04-28 ENCOUNTER — Ambulatory Visit
Admission: RE | Admit: 2021-04-28 | Discharge: 2021-04-28 | Disposition: A | Discharge status: Home / Self Care | Payer: 59 | Source: Ambulatory Visit | Attending: Radiation Oncology | Admitting: Radiation Oncology

## 2021-04-28 DIAGNOSIS — Z510 Encounter for antineoplastic radiation therapy: Secondary | ICD-10-CM | POA: Diagnosis not present

## 2021-04-29 ENCOUNTER — Encounter: Payer: Self-pay | Admitting: *Deleted

## 2021-04-29 ENCOUNTER — Ambulatory Visit
Admission: RE | Admit: 2021-04-29 | Discharge: 2021-04-29 | Disposition: A | Discharge status: Home / Self Care | Payer: 59 | Source: Ambulatory Visit | Attending: Radiation Oncology | Admitting: Radiation Oncology

## 2021-04-29 DIAGNOSIS — Z510 Encounter for antineoplastic radiation therapy: Secondary | ICD-10-CM | POA: Diagnosis not present

## 2021-05-02 ENCOUNTER — Ambulatory Visit: Payer: 59 | Attending: General Surgery

## 2021-05-02 ENCOUNTER — Other Ambulatory Visit: Payer: Self-pay

## 2021-05-02 ENCOUNTER — Ambulatory Visit
Admission: RE | Admit: 2021-05-02 | Discharge: 2021-05-02 | Disposition: A | Discharge status: Home / Self Care | Payer: 59 | Source: Ambulatory Visit | Attending: Radiation Oncology | Admitting: Radiation Oncology

## 2021-05-02 DIAGNOSIS — Z483 Aftercare following surgery for neoplasm: Secondary | ICD-10-CM | POA: Insufficient documentation

## 2021-05-02 DIAGNOSIS — Z510 Encounter for antineoplastic radiation therapy: Secondary | ICD-10-CM | POA: Diagnosis not present

## 2021-05-02 NOTE — Therapy (Signed)
[place], [place], [address] Phone: [phone]   Fax:  [phone]  Physical Therapy Treatment  Patient Details  Name: Cassandra Cox MRN: [id number] Date of Birth: [birth year] Referring Provider (PT): Dr. Autumn Messing   Encounter Date: 05/02/2021   PT End of Session - 05/02/21 1514     Visit Number 3   # unchanged due to screen only   PT Start Time 1458    PT Stop Time 1513    PT Time Calculation (min) 15 min    Activity Tolerance Patient tolerated treatment well    Behavior During Therapy [facility] for tasks assessed/performed             Past Medical History:  Diagnosis Date   Anxiety    Complication of anesthesia    Coronary artery disease    a. 11/2016 Cath/PCI: LM nl, LAD 30ost, 2m(3.0x18 Resolute Integrity DES--3.75), LCX nl, RCA nl; b. MV 11/18: negative for ischemia, EF 72%, low risk   Depression    Diabetes mellitus without complication ([facility]    type 2   Diastolic dysfunction    a. 11/2016 Echo: EF 55-60%, Gr2 DD, LAE, nl RV size/fxn.   Family history of breast cancer    History of kidney stones    Hyperlipemia \\[id number]\  Hypothyroidism    Kidney stone 08/2016   Mitral valve prolapse    MVP (mitral valve prolapse)    Neuromuscular disorder (HCC)    bilateral feet   PONV (postoperative nausea and vomiting)    STEMI (ST elevation myocardial infarction) ([facility] 12/02/2016   Vitamin B 12 deficiency     Past Surgical History:  Procedure Laterality Date   ABDOMINAL HYSTERECTOMY  2003   TAH due to adenomyosis   BREAST LUMPECTOMY WITH RADIOACTIVE SEED AND SENTINEL LYMPH NODE BIOPSY Right 10/04/2020   Procedure: RIGHT BREAST REDUCTION LUMPECTOMY WITH RADIOACTIVE SEED AND SENTINEL LYMPH NODE BIOPSY;  Surgeon: TJovita Kussmaul MD;  [facility]  Service: General;  Laterality: Right;   BREAST REDUCTION SURGERY  1990   BREAST REDUCTION SURGERY Bilateral 10/04/2020    Procedure: BILATERAL MAMMARY REDUCTION  (BREAST);  Surgeon: DWallace Going DO;  [facility]  Service: Plastics;  Laterality: Bilateral;   CARDIAC CATHETERIZATION N/A 12/02/2016   Procedure: Coronary Stent Intervention;  Surgeon: CNelva Bush MD;  [facility];  Service: Cardiovascular;  Laterality: N/A;   CARDIAC CATHETERIZATION N/A 12/02/2016   Procedure: Left Heart Cath and Coronary Angiography;  Surgeon: CNelva Bush MD;  [facility];  Service: Cardiovascular;  Laterality: N/A;   CARDIAC CATHETERIZATION N/A 12/02/2016   Procedure: Intravascular Ultrasound/IVUS;  Surgeon: CNelva Bush MD;  [facility];  Service: Cardiovascular;  Laterality: N/A;   CORONARY STENT INTERVENTION     MOUTH SURGERY     Dental implant   PORTACATH PLACEMENT Left 12/13/2020   Procedure: INSERTION PORT-A-CATH LEFT SUBCLAVIAN;  Surgeon: TJovita Kussmaul MD;  [facility]  Service: General;  Laterality: Left;    There were no vitals filed for this visit.   Subjective Assessment - 05/02/21 1500     Subjective Pt returns for her 3 month L-Dex screen.    Pertinent History Patient was diagnosed on 06/30/2020 with right grade III invasive ductal carcinoma breast cancer. Patient underwent a right lumpectomy and sentinel node biopsy (1/3 nodes positive) on 10/04/2020 with a bilateral reduction.  It is ER/PR positive and HER2 negative with a Ki67 of 30%.                    L-DEX FLOWSHEETS - 05/02/21 1500       L-DEX LYMPHEDEMA SCREENING   Measurement Type Unilateral    L-DEX MEASUREMENT EXTREMITY Upper Extremity    POSITION  Standing    DOMINANT SIDE Right    At Risk Side Right    BASELINE SCORE (UNILATERAL) -2.2    L-DEX SCORE (UNILATERAL) -0.6    VALUE CHANGE (UNILAT) 1.6                                    PT Long Term Goals - 11/25/20 1135       PT LONG  TERM GOAL #1   Title Patient will demonstrate she has regained full shoulder ROM and function post operatively compared to baselines.    Time 8    Period Weeks    Status Achieved                   Plan - 05/02/21 1515     Clinical Impression Statement Pt returns for her 3 month L-Dex screen. Her change from baseline of 1.6 is WNLs so no further treatment is required at this time except to cont every 3 month L-Dex screens which pt is agreeable to. Pt does report feeling some fullness at the lateral aspect of her breast so issued a script from Dr. Jana Hakim for a compression bra but instructed her to wait on being measured for this until about 2 weeks after radiation to allow skin time to heal. Also showed her [employer] brand she can get from [facility] in the meantime that is mildly supportive. Pt verbalized understanding.    PT Next Visit Plan Cont every 3 month L-Dex screens for up to 2 years from her SLNB.    Consulted and Agree with Plan of Care Patient             Patient will benefit from skilled therapeutic intervention in order to improve the following deficits and impairments:     Visit Diagnosis: Aftercare following surgery for neoplasm     Problem List Patient Active Problem List   Diagnosis Date Noted   Genetic testing 01/11/2021   Port-A-Cath in place 12/14/2020   Family history of breast cancer    Malignant neoplasm of upper-outer quadrant of right breast in female, estrogen receptor positive ([facility]) 08/13/2020   Shortness of breath 02/19/2020   Essential hypertension 09/09/2018   Influenza B 10/18/2017   Unstable angina ([place]) 09/14/2017   Coronary artery disease involving native coronary artery of native heart without angina pectoris 07/18/2017   Insomnia 08/25/2015   Hot flashes, menopausal 08/25/2015   Well woman exam 07/22/2015   Family history of diabetes mellitus 07/15/2014   Vitamin D deficiency 11/03/2009   Hyperlipidemia LDL goal <70 11/03/2009    ANXIETY 11/03/2009   Depression 11/03/2009    [name], PTA 05/02/2021, 3:18 PM  [place], [place], [address] Phone: [phone]   Fax:  [phone]  Name: Cassandra Cox MRN: [id number] Date of Birth: 1961/03/23

## 2021-05-02 NOTE — Progress Notes (Signed)
Cassandra Cox  [phone] Fax:([phone]) [phone]     ID: Cassandra Cox DOB: 08-06-1961  MR#: [id number]  JQB#:[id number]  Patient Care Team: Pcp, No as PCP - General End, Harrell Gave, MD as PCP - Cardiology (Cardiology) Mauro Kaufmann, RN as Oncology Nurse Navigator Rockwell Germany, RN as Oncology Nurse Navigator Jovita Kussmaul, MD as Consulting Physician (General Surgery) Truitt Merle, MD as Consulting Physician (Hematology) Kyung Rudd, MD as Consulting Physician (Radiation Oncology) Chauncey Cruel, MD OTHER MD:  CHIEF COMPLAINT: estrogen receptor positive breast cancer  CURRENT TREATMENT: Tamoxifen   INTERVAL HISTORY: Cassandra Cox returns today for follow up of her estrogen receptor positive breast cancer.  She completed adjuvant chemotherapy, consisting of docetaxel and cyclophosphamide every 21 days x4, at her last visit on 02/15/2021.  She was referred back to Dr. Lisbeth Renshaw to review radiation therapy and saw his PA, Bryson Ha, on 02/23/2021. She opted to receive treatment closer to home and met Dr. Baruch Gouty on 03/03/2021. She subsequently began treatment at [facility] on 03/15/2021 and is scheduled to finish tomorrow, 05/04/2021.  She is now ready to consider antiestrogens  REVIEW OF SYSTEMS: Renalda generally tolerated radiation well.  She had some peeling and she felt fatigued.  She has been able to continue to work and she says family is "okay".  A detailed review of systems today was otherwise stable   COVID 19 VACCINATION STATUS: Refuses vaccination   HISTORY OF CURRENT ILLNESS: From my original evaluation note:  Cassandra Cox had routine screening mammography on 06/30/2020 showing a possible abnormality in the right breast. She underwent right diagnostic mammography with tomography and right breast ultrasonography at [facility] on 07/20/2020 showing: breast density category C; 1.1 cm irregular mass in right breast at 10-11 o'clock, better seen on  mammogram; no suspicious right axillary lymph nodes.  Accordingly on 08/09/2020 she proceeded to biopsy of the right breast area in question. The pathology from this procedure  showed: invasive and in situ mammary carcinoma, e-cadherin positive, grade 3. Prognostic indicators significant for: estrogen receptor, 95% positive and progesterone receptor, 90% positive. Proliferation marker Ki67 at 30%. HER2 negative by immunohistochemistry.  She underwent right lumpectomy on 10/04/2020 under Dr. Marlou Starks. Pathology from the procedure [id number]) showed: invasive ductal carcinoma, grade 2, 1.2 cm; ductal carcinoma in situ, intermediate grade; calcifications associated with carcinoma; margins uninvolved; lymphovascular space invasion present.  Out of three biopsied lymph nodes, one revealed metastatic carcinoma (1/3). The positive lymph node showed a disrupted capsule that is suspicious for extracapsular extension.  Mammaprint was performed on the final surgical sample and revealed high risk.  The patient's subsequent history is as detailed below.   PAST MEDICAL HISTORY: Past Medical History:  Diagnosis Date   Anxiety    Complication of anesthesia    Coronary artery disease    a. 11/2016 Cath/PCI: LM nl, LAD 30ost, 25m(3.0x18 Resolute Integrity DES--3.75), LCX nl, RCA nl; b. MV 11/18: negative for ischemia, EF 72%, low risk   Depression    Diabetes mellitus without complication ([place]    type 2   Diastolic dysfunction    a. 11/2016 Echo: EF 55-60%, Gr2 DD, LAE, nl RV size/fxn.   Family history of breast cancer    History of kidney stones    Hyperlipemia \[id number]   Hypothyroidism    Kidney stone 08/2016   Mitral valve prolapse    MVP (mitral valve prolapse)    Neuromuscular disorder (HCC)    bilateral feet   PONV (postoperative  nausea and vomiting)    STEMI (ST elevation myocardial infarction) ([place]) 12/02/2016   Vitamin B 12 deficiency     PAST SURGICAL HISTORY: Past Surgical History:   Procedure Laterality Date   ABDOMINAL HYSTERECTOMY  2003   TAH due to adenomyosis   BREAST LUMPECTOMY WITH RADIOACTIVE SEED AND SENTINEL LYMPH NODE BIOPSY Right 10/04/2020   Procedure: RIGHT BREAST REDUCTION LUMPECTOMY WITH RADIOACTIVE SEED AND SENTINEL LYMPH NODE BIOPSY;  Surgeon: Jovita Kussmaul, MD;  Location: [place];  Service: General;  Laterality: Right;   BREAST REDUCTION SURGERY  1990   BREAST REDUCTION SURGERY Bilateral 10/04/2020   Procedure: BILATERAL MAMMARY REDUCTION  (BREAST);  Surgeon: Wallace Going, DO;  Location: [place];  Service: Plastics;  Laterality: Bilateral;   CARDIAC CATHETERIZATION N/A 12/02/2016   Procedure: Coronary Stent Intervention;  Surgeon: Nelva Bush, MD;  Location: [place] [facility];  Service: Cardiovascular;  Laterality: N/A;   CARDIAC CATHETERIZATION N/A 12/02/2016   Procedure: Left Heart Cath and Coronary Angiography;  Surgeon: Nelva Bush, MD;  Location: [place] [facility];  Service: Cardiovascular;  Laterality: N/A;   CARDIAC CATHETERIZATION N/A 12/02/2016   Procedure: Intravascular Ultrasound/IVUS;  Surgeon: Nelva Bush, MD;  Location: [place] [facility];  Service: Cardiovascular;  Laterality: N/A;   CORONARY STENT INTERVENTION     MOUTH SURGERY     Dental implant   PORTACATH PLACEMENT Left 12/13/2020   Procedure: INSERTION PORT-A-CATH LEFT SUBCLAVIAN;  Surgeon: Jovita Kussmaul, MD;  Location: [place];  Service: General;  Laterality: Left;    FAMILY HISTORY: Family History  Problem Relation Age of Onset   Heart disease Mother    Diabetes Mother    Stroke Mother    Heart attack Mother    Atrial fibrillation Mother    Hypertension Mother    Hyperlipidemia Mother    COPD Father    Hypertension Father    Hyperlipidemia Father    Breast cancer Maternal Aunt 25       x2 age 75   Diabetes Maternal Grandfather    Her father died at age 52 and her mother at age 43. Cassandra Cox  is an only child. She reports breast cancer in a maternal aunt two separate times, in her 65's and then in her 68's.   GYNECOLOGIC HISTORY:  No LMP recorded. Patient has had a hysterectomy. Menarche: 60 years old Age at first live birth: 60 years old [place] P 2 LMP 07/1997 (having periods at time of hysterectomy) Contraceptive: used oral contraceptives for 17 years without complications ([id number]) HRT used progesterone for 2-3 years, stopped with cancer diagnosis Hysterectomy? Yes, 1998 for adenomyosis BSO? 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 She lives by herself with her dachshund. Daughter Carmell [place] is 16, lives in [place], and is a homemaker; she suffers from [employer]. Daughter [place] is 61, lives in [place] and works as a Psychologist, educational.  The patient has 1 grandchild.  She is a Psychologist, forensic.    ADVANCED DIRECTIVES:    HEALTH MAINTENANCE: Social History   Tobacco Use   Smoking status: Never   Smokeless tobacco: Never  Vaping Use   Vaping Use: Never used  Substance Use Topics   Alcohol use: No   Drug use: No     Colonoscopy:   PAP:   Bone density:    Allergies  Allergen Reactions   Prednisone Other (See Comments)  REACTION: Swelling REACTION: Swelling Facial swelling REACTION: Swelling   Sulfa Antibiotics Hives and Other (See Comments)    REACTION: Hives   Sulfasalazine     REACTION: Hives   Sulfonamide Derivatives     REACTION: Hives    Current Outpatient Medications  Medication Sig Dispense Refill   tamoxifen (NOLVADEX) 20 MG tablet Take 1 tablet (20 mg total) by mouth daily. Start 06/06/2021 90 tablet 4   ALPRAZolam (XANAX) 0.25 MG tablet Take 0.25 mg by mouth 3 (three) times daily as needed for anxiety.     ARMOUR THYROID 60 MG tablet Take 60 mg by mouth daily.     aspirin EC 81 MG tablet Take 81 mg by mouth daily.     atorvastatin (LIPITOR) 80 MG tablet TAKE 1 TABLET BY MOUTH DAILY AT  6PM GENERIC EQUIVALENT FOR LIPITOR 90 tablet 1   buPROPion (WELLBUTRIN XL) 150 MG 24 hr tablet Take 300 mg by mouth daily.      Cholecalciferol (VITAMIN D-3) 5000 UNITS TABS Take 1 tablet by mouth daily.     clopidogrel (PLAVIX) 75 MG tablet TAKE 1 TABLET BY MOUTH DAILY GENERIC EQUIVALENT FOR PLAVIX 90 tablet 3   Coenzyme Q10 (COQ10) 100 MG CAPS Take 100 mg by mouth daily. (Patient not taking: No sig reported)     Cyanocobalamin 1000 MCG/ML KIT Inject 1,000 mcg as directed once a week.     ezetimibe (ZETIA) 10 MG tablet TAKE 1 TABLET BY MOUTH EVERY DAY 90 tablet 1   FLUoxetine (PROZAC) 20 MG capsule Take 20 mg by mouth daily.      HYDROcodone-acetaminophen (NORCO/VICODIN) 5-325 MG tablet Take 1-2 tablets by mouth every 6 (six) hours as needed for moderate pain or severe pain. 10 tablet 0   ketoconazole (NIZORAL) 2 % cream Apply 1 application topically daily. (Patient not taking: No sig reported) 15 g 0   levocetirizine (XYZAL) 5 MG tablet Take 1 tablet at bedtime by mouth.  2   liothyronine (CYTOMEL) 5 MCG tablet Take 5 mcg by mouth every morning.      magnesium gluconate (MAGONATE) 500 MG tablet Take 1,000 mg by mouth at bedtime.     metFORMIN (GLUCOPHAGE-XR) 500 MG 24 hr tablet Take 500 mg by mouth 2 (two) times daily.     montelukast (SINGULAIR) 10 MG tablet Take 10 mg by mouth every morning.     NALTREXONE HCL PO Take 4 mg by mouth at bedtime.  (Patient not taking: No sig reported)     nitroGLYCERIN (NITROSTAT) 0.4 MG SL tablet Place 1 tablet (0.4 mg total) under the tongue every 5 (five) minutes as needed for chest pain. For maximum of 3 doses. 25 tablet 3   valACYclovir (VALTREX) 500 MG tablet Take 1 tablet (500 mg total) by mouth daily. 90 tablet 1   vitamin C (ASCORBIC ACID) 500 MG tablet Take 500 mg by mouth daily. (Patient not taking: No sig reported)     zinc gluconate 50 MG tablet Take 50 mg by mouth daily.     zolpidem (AMBIEN CR) 12.5 MG CR tablet Take 12.5 mg by mouth at bedtime as  needed for sleep.     No current facility-administered medications for this visit.    OBJECTIVE: White woman examined in no acute distress  Vitals:   05/03/21 1155  BP: 122/62  Pulse: 94  Resp: 17  Temp: 97.7 F (36.5 C)  SpO2: 100%     Body mass index is 31.9 kg/m.   Wt   Readings from Last 3 Encounters:  05/03/21 180 lb 1 oz (81.7 kg)  03/03/21 182 lb 8 oz (82.8 kg)  02/23/21 184 lb 6 oz (83.6 kg)   For vitals associated with the 02/15/2021 visit please see the infusion area flowsheet    ECOG FS:1 - Symptomatic but completely ambulatory  Sclerae unicteric, EOMs intact Wearing a mask No cervical or supraclavicular adenopathy Lungs no rales or rhonchi Heart regular rate and rhythm Abd soft, nontender, positive bowel sounds MSK no focal spinal tenderness, no upper extremity lymphedema Neuro: nonfocal, well oriented, appropriate affect Breasts: The right breast is status postlumpectomy and is currently undergoing radiation.  There is erythema and some desquamation particularly in the right axilla.  Overall however the cosmetic result is very good.  The left breast and both axillae are benign.   LAB RESULTS:  CMP     Component Value Date/Time   NA 140 05/03/2021 1134   NA 140 03/03/2020 1052   K 4.4 05/03/2021 1134   CL 105 05/03/2021 1134   CO2 24 05/03/2021 1134   GLUCOSE 169 (H) 05/03/2021 1134   BUN 16 05/03/2021 1134   BUN 18 03/03/2020 1052   CREATININE 0.74 05/03/2021 1134   CALCIUM 9.4 05/03/2021 1134   PROT 6.7 05/03/2021 1134   PROT 6.5 03/03/2020 1052   ALBUMIN 3.6 05/03/2021 1134   ALBUMIN 4.4 03/03/2020 1052   AST 20 05/03/2021 1134   ALT 32 05/03/2021 1134   ALKPHOS 93 05/03/2021 1134   BILITOT 0.4 05/03/2021 1134   GFRNONAA >60 05/03/2021 1134   GFRAA 111 03/03/2020 1052    No results found for: TOTALPROTELP, ALBUMINELP, A1GS, A2GS, BETS, BETA2SER, GAMS, MSPIKE, SPEI  Lab Results  Component Value Date   WBC 4.7 05/03/2021   NEUTROABS 3.4  05/03/2021   HGB 12.6 05/03/2021   HCT 39.3 05/03/2021   MCV 94.7 05/03/2021   PLT 264 05/03/2021    No results found for: LABCA2  No components found for: VHQION629  No results for input(s): INR in the last 168 hours.  No results found for: LABCA2  No results found for: BMW413  No results found for: KGM010  No results found for: UVO536  No results found for: CA2729  No components found for: HGQUANT  No results found for: CEA1 / No results found for: CEA1   No results found for: AFPTUMOR  No results found for: CHROMOGRNA  No results found for: KPAFRELGTCHN, LAMBDASER, KAPLAMBRATIO (kappa/lambda light chains)  No results found for: HGBA, HGBA2QUANT, HGBFQUANT, HGBSQUAN (Hemoglobinopathy evaluation)   No results found for: LDH  No results found for: IRON, TIBC, IRONPCTSAT (Iron and TIBC)  No results found for: FERRITIN  Urinalysis    Component Value Date/Time   COLORURINE RED (A) 08/28/2016 1428   APPEARANCEUR CLOUDY (A) 08/28/2016 1428   LABSPEC 1.017 08/28/2016 1428   PHURINE 7.0 08/28/2016 1428   GLUCOSEU NEGATIVE 08/28/2016 1428   HGBUR LARGE (A) 08/28/2016 1428   BILIRUBINUR NEG 09/25/2017 1443   KETONESUR 15 (A) 08/28/2016 1428   PROTEINUR NEG 09/25/2017 1443   PROTEINUR NEGATIVE 08/28/2016 1428   UROBILINOGEN negative 06/12/2016 1610   NITRITE NEG 09/25/2017 1443   NITRITE NEGATIVE 08/28/2016 1428   LEUKOCYTESUR Negative 09/25/2017 1443    STUDIES: No results found.   ELIGIBLE FOR AVAILABLE RESEARCH PROTOCOL: AET  ASSESSMENT: 60 y.o. [place] woman status post right lumpectomy and sentinel lymph node sampling 10/04/2020 for a pT1c pN1, stage IB   invasive ductal carcinoma, grade 2,  estrogen and progesterone receptor positive, HER2 not amplified, with an MIB-1 of 30%  (a) a total of 3 right axillary lymph nodes removed, one positive with concern regarding extracapsular extension  (1) MammaPrint high risk predicts a significant benefit from  chemotherapy  (2) adjuvant chemotherapy will consist of cyclophosphamide and docetaxel every 21 days x 4 starting 12/14/2020, completed 02/15/2021  (a) patient used DigniCap successfully for hair preservation  (3) adjuvant radiation to be completed 05/04/2021  (4) tamoxifen to start 06/06/2021  (A) bone density  (B) s/p hysterectomy  (C) s/p OCP and HR for many years w/o complications   PLAN:  Pepper will complete her adjuvant radiation tomorrow.  She has generally tolerated this well.  However she does have fatigue and some local discomfort.  I think it would be wise to let some of the symptoms resolved before starting her antiestrogens.  We discussed the difference between tamoxifen and anastrozole in detail. She understands that anastrozole and the aromatase inhibitors in general work by blocking estrogen production. Accordingly vaginal dryness, decrease in bone density, and of course hot flashes can result. The aromatase inhibitors can also negatively affect the cholesterol profile, although that is a minor effect. One out of 5 women on aromatase inhibitors we will feel "old and achy". This arthralgia/myalgia syndrome, which resembles fibromyalgia clinically, does resolve with stopping the medications. Accordingly this is not a reason to not try an aromatase inhibitor but it is a frequent reason to stop it (in other words 20% of women will not be able to tolerate these medications).  Tamoxifen on the other hand does not block estrogen production. It does not "take away a woman's estrogen". It blocks the estrogen receptor in breast cells. Like anastrozole, it can also cause hot flashes. As opposed to anastrozole, tamoxifen has many estrogen-like effects. It is technically an estrogen receptor modulator. This means that in some tissues tamoxifen works like estrogen-- for example it helps strengthen the bones. It tends to improve the cholesterol profile. It can cause thickening of the endometrial  lining, and even endometrial polyps or rarely cancer of the uterus.(The risk of uterine cancer due to tamoxifen is one additional cancer per thousand women year). It can cause vaginal wetness or stickiness. It can cause blood clots through this estrogen-like effect--the risk of blood clots with tamoxifen is exactly the same as with birth control pills or hormone replacement.  Neither of these agents causes mood changes or weight gain, despite the popular belief that they can have these side effects. We have data from studies comparing either of these drugs with placebo, and in those cases the control group had the same amount of weight gain and depression as the group that took the drug.  Given the fact that she is status post hysterectomy and that she took estrogen-containing drugs for many years with no clotting complications and I think she is a particularly good candidate for tamoxifen.  We are going to wait until 06/06/2021 before starting that medication.  I will then have a virtual visit with her in October to make sure she is tolerating it well.  If so the plan will be to continue for minimum of 5 years.  At that virtual visit we will set her up for her next mammogram and consider bone density  Total encounter time 35 minutes.Sarajane Jews C. Marisal Swarey, MD 05/03/2021 5:21 PM Medical Oncology and Hematology [facility] [place], [place] [address] Tel. [phone]  Fax. [phone]   This document serves as a record of services personally performed by Gustav Magrinat, MD. It was created on his behalf by Katie Daubenspeck, a trained medical scribe. The creation of this record is based on the scribe's personal observations and the provider's statements to them.   I, Gustav Magrinat MD, have reviewed the above documentation for accuracy and completeness, and I agree with the above.   *Total Encounter Time as defined by the Centers for Medicare and Medicaid  Services includes, in addition to the face-to-face time of a patient visit (documented in the note above) non-face-to-face time: obtaining and reviewing outside history, ordering and reviewing medications, tests or procedures, care coordination (communications with other health care professionals or caregivers) and documentation in the medical record.  

## 2021-05-02 NOTE — Progress Notes (Signed)
Follow-up Outpatient Visit Date: 05/04/2021  Primary Care Provider: Pcp, No No address on file  Chief Complaint: Follow-up coronary artery disease  HPI:  Cassandra Cox is a 60 y.o. female with history of coronary artery disease status post anterior STEMI (11/2016), hyperlipidemia, type 2 diabetes mellitus, breast cancer, and anxiety, who presents for follow-up of exertional dyspnea and history of coronary artery disease.  I last saw Cassandra Cox 3 months ago, at which time Cassandra Cox reported shortness of breath and chest pain associated with chemotherapy and associated colony-stimulating factor treatment.  Subsequent limited echo showed normal LVEF with grade 1 diastolic dysfunction.  Today, Cassandra Cox happily reports that Cassandra Cox has just finished Cassandra Cox last radiation treatment.  Cassandra Cox will be beginning tamoxifen in August.  Cassandra Cox has not had any further chest pain since completing chemotherapy.  Cassandra Cox shortness of breath is gradually improving.  Cassandra Cox has not had any palpitations, lightheadedness, orthopnea, or edema.  Cassandra Cox notes that Cassandra Cox bruises easily.  --------------------------------------------------------------------------------------------------  Cardiovascular History & Procedures: Cardiovascular Problems: Coronary artery disease status post anterior STEMI (11/2016)   Risk Factors: Known CAD, hyperlipidemia, and type 2 diabetes mellitus   Cath/PCI: LHC/PCI (12/02/16): LMCA normal. LAD 30% ostial and 99% mid vessel disease. LCx and RCA without significant disease. Successful IVUS-guided PCI to mid LAD with placement of Integrity Resolute 3.0 x 18 mm drug-eluting stent postdilated with 3.75 mm [place] balloon.   CV Surgery: None   EP Procedures and Devices: None   Non-Invasive Evaluation(s): Limited TTE (03/29/2021): Normal LV size and wall thickness.  LVEF 55-60% with grade 1 diastolic dysfunction.  Normal RV size and function.  Normal PA pressure.  No significant valvular abnormality. TTE (06/23/2020):  Normal LV size and wall thickness.  LVEF 60-65% with grade 2 diastolic dysfunction.  Normal RV size and function.  No significant valvular abnormality. TTE (09/14/2017): Normal LV size.  LVEF 55-60% with normal wall motion.  Grade 2 diastolic dysfunction with elevated filling pressure.  Normal RV size and function.  No significant valvular abnormality. Pharmacologic MPI (09/14/2017): Normal study without ischemia or scar.  LVEF 72%. TTE (12/03/16): Normal LV size with mild LVH. LVEF 55-60% with grade 2 diastolic dysfunction. Left atrial enlargement. Normal RV size and function. No significant valvular abnormalities.  Recent CV Pertinent Labs: Lab Results  Component Value Date   CHOL 153 01/27/2021   HDL 63 01/27/2021   LDLCALC 76 01/27/2021   LDLDIRECT 142.2 12/04/2013   TRIG 71 01/27/2021   CHOLHDL 2.5 03/03/2020   CHOLHDL 2.3 09/14/2017   INR 1.03 09/14/2017   K 4.4 05/03/2021   MG 1.7 12/04/2016   BUN 16 05/03/2021   BUN 18 03/03/2020   CREATININE 0.74 05/03/2021    Past medical and surgical history were reviewed and updated in EPIC.  Current Meds  Medication Sig   ALPRAZolam (XANAX) 0.25 MG tablet Take 0.25 mg by mouth 3 (three) times daily as needed for anxiety.   ARMOUR THYROID 60 MG tablet Take 60 mg by mouth daily.   aspirin EC 81 MG tablet Take 81 mg by mouth daily.   atorvastatin (LIPITOR) 80 MG tablet TAKE 1 TABLET BY MOUTH DAILY AT 6PM GENERIC EQUIVALENT FOR LIPITOR   buPROPion (WELLBUTRIN XL) 150 MG 24 hr tablet Take 300 mg by mouth daily.    Cholecalciferol (VITAMIN D-3) 5000 UNITS TABS Take 1 tablet by mouth daily.   Cyanocobalamin 1000 MCG/ML KIT Inject 1,000 mcg as directed. Every other week   ezetimibe (ZETIA) 10 MG tablet  TAKE 1 TABLET BY MOUTH EVERY DAY   FLUoxetine (PROZAC) 20 MG capsule Take 20 mg by mouth daily.    HYDROcodone-acetaminophen (NORCO/VICODIN) 5-325 MG tablet Take 1-2 tablets by mouth every 6 (six) hours as needed for moderate pain or severe pain.    levocetirizine (XYZAL) 5 MG tablet Take 1 tablet at bedtime by mouth.   liothyronine (CYTOMEL) 5 MCG tablet Take 5 mcg by mouth every morning.    magnesium gluconate (MAGONATE) 500 MG tablet Take 1,000 mg by mouth at bedtime.   metFORMIN (GLUCOPHAGE-XR) 500 MG 24 hr tablet Take 500 mg by mouth 2 (two) times daily.   montelukast (SINGULAIR) 10 MG tablet Take 10 mg by mouth every morning.   nitroGLYCERIN (NITROSTAT) 0.4 MG SL tablet Place 1 tablet (0.4 mg total) under the tongue every 5 (five) minutes as needed for chest pain. For maximum of 3 doses.   tamoxifen (NOLVADEX) 20 MG tablet Take 1 tablet (20 mg total) by mouth daily. Start 06/06/2021   valACYclovir (VALTREX) 500 MG tablet Take 1 tablet (500 mg total) by mouth daily.   vitamin C (ASCORBIC ACID) 500 MG tablet Take 500 mg by mouth daily.   zinc gluconate 50 MG tablet Take 50 mg by mouth daily.   zolpidem (AMBIEN CR) 12.5 MG CR tablet Take 12.5 mg by mouth at bedtime as needed for sleep.   [DISCONTINUED] clopidogrel (PLAVIX) 75 MG tablet TAKE 1 TABLET BY MOUTH DAILY GENERIC EQUIVALENT FOR PLAVIX    Allergies: Prednisone, Sulfa antibiotics, Sulfasalazine, and Sulfonamide derivatives  Social History   Tobacco Use   Smoking status: Never   Smokeless tobacco: Never  Vaping Use   Vaping Use: Never used  Substance Use Topics   Alcohol use: No   Drug use: No    Family History  Problem Relation Age of Onset   Heart disease Mother    Diabetes Mother    Stroke Mother    Heart attack Mother    Atrial fibrillation Mother    Hypertension Mother    Hyperlipidemia Mother    COPD Father    Hypertension Father    Hyperlipidemia Father    Breast cancer Maternal Aunt 39       x2 age 11   Diabetes Maternal Grandfather     Review of Systems: A 12-system review of systems was performed and was negative except as noted in the  HPI.  --------------------------------------------------------------------------------------------------  Physical Exam: BP 110/66 (BP Location: Left Arm, Patient Position: Sitting, Cuff Size: Large)   Pulse 97   Ht 5' 3" (1.6 m)   Wt 179 lb (81.2 kg)   SpO2 97%   BMI 31.71 kg/m   General:  NAD. Neck: No JVD or HJR. Lungs: Clear to auscultation bilaterally without wheezes or crackles. Heart: Regular rate and rhythm without murmurs, rubs, or gallops. Abdomen: Soft, nontender, nondistended. Extremities: No lower extremity edema.  Ecchymosis overlying left shin noted.   Lab Results  Component Value Date   WBC 4.7 05/03/2021   HGB 12.6 05/03/2021   HCT 39.3 05/03/2021   MCV 94.7 05/03/2021   PLT 264 05/03/2021    Lab Results  Component Value Date   NA 140 05/03/2021   K 4.4 05/03/2021   CL 105 05/03/2021   CO2 24 05/03/2021   BUN 16 05/03/2021   CREATININE 0.74 05/03/2021   GLUCOSE 169 (H) 05/03/2021   ALT 32 05/03/2021    Lab Results  Component Value Date   CHOL 153 01/27/2021  HDL 63 01/27/2021   LDLCALC 76 01/27/2021   LDLDIRECT 142.2 12/04/2013   TRIG 71 01/27/2021   CHOLHDL 2.5 03/03/2020    --------------------------------------------------------------------------------------------------  ASSESSMENT AND PLAN: Coronary artery disease without angina: Cassandra Cox is doing well without any recurrent angina.  Cassandra Cox is over 4 years out from Cassandra Cox STEMI and PCI in 11/2016.  As Cassandra Cox has not had any recent chest pain and echo showed preserved LVEF, we have agreed to discontinue clopidogrel and continue with single antiplatelet therapy with low-dose aspirin.  Continue secondary prevention with atorvastatin and ezetimibe.  Hyperlipidemia associated with type 2 diabetes: Most recent lipid panel in March showed LDL just above goal at 76.  We will plan to continue atorvastatin 80 mg daily and ezetimibe 10 mg daily.  I encouraged Cassandra Cox to work on lifestyle modifications  to help bring Cassandra Cox LDL below 70.  Follow-up: Return to clinic in 6 months.  Nelva Bush, MD 05/05/2021 6:36 AM

## 2021-05-03 ENCOUNTER — Other Ambulatory Visit: Payer: Self-pay

## 2021-05-03 ENCOUNTER — Inpatient Hospital Stay: Payer: 59 | Attending: Genetic Counselor | Admitting: Oncology

## 2021-05-03 ENCOUNTER — Ambulatory Visit
Admission: RE | Admit: 2021-05-03 | Discharge: 2021-05-03 | Disposition: A | Discharge status: Home / Self Care | Payer: 59 | Source: Ambulatory Visit | Attending: Radiation Oncology | Admitting: Radiation Oncology

## 2021-05-03 ENCOUNTER — Inpatient Hospital Stay: Payer: 59

## 2021-05-03 VITALS — BP 122/62 | HR 94 | Temp 97.7°F | Resp 17 | Ht 63.0 in | Wt 180.1 lb

## 2021-05-03 DIAGNOSIS — I252 Old myocardial infarction: Secondary | ICD-10-CM | POA: Diagnosis not present

## 2021-05-03 DIAGNOSIS — C50411 Malignant neoplasm of upper-outer quadrant of right female breast: Secondary | ICD-10-CM | POA: Insufficient documentation

## 2021-05-03 DIAGNOSIS — Z170 Estrogen receptor positive status [ER+]: Secondary | ICD-10-CM | POA: Diagnosis not present

## 2021-05-03 DIAGNOSIS — Z510 Encounter for antineoplastic radiation therapy: Secondary | ICD-10-CM | POA: Diagnosis not present

## 2021-05-03 DIAGNOSIS — Z803 Family history of malignant neoplasm of breast: Secondary | ICD-10-CM | POA: Insufficient documentation

## 2021-05-03 DIAGNOSIS — Z90710 Acquired absence of both cervix and uterus: Secondary | ICD-10-CM | POA: Diagnosis not present

## 2021-05-03 LAB — CMP (CANCER CENTER ONLY)
ALT: 32 U/L (ref 0–44)
AST: 20 U/L (ref 15–41)
Albumin: 3.6 g/dL (ref 3.5–5.0)
Alkaline Phosphatase: 93 U/L (ref 38–126)
Anion gap: 11 (ref 5–15)
BUN: 16 mg/dL (ref 8–23)
CO2: 24 mmol/L (ref 22–32)
Calcium: 9.4 mg/dL (ref 8.9–10.3)
Chloride: 105 mmol/L (ref 98–111)
Creatinine: 0.74 mg/dL (ref 0.44–1.00)
GFR, Estimated: >60 mL/min (ref >60)
Glucose, Bld: 169 mg/dL — ABNORMAL HIGH (ref 70–99)
Potassium: 4.4 mmol/L (ref 3.5–5.1)
Sodium: 140 mmol/L (ref 135–145)
Total Bilirubin: 0.4 mg/dL (ref 0.3–1.2)
Total Protein: 6.7 g/dL (ref 6.5–8.1)

## 2021-05-03 LAB — CBC WITH DIFFERENTIAL (CANCER CENTER ONLY)
Abs Immature Granulocytes: 0.01 10*3/uL (ref 0.00–0.07)
Basophils Absolute: 0 10*3/uL (ref 0.0–0.1)
Basophils Relative: 1 %
Eosinophils Absolute: 0.1 10*3/uL (ref 0.0–0.5)
Eosinophils Relative: 2 %
HCT: 39.3 % (ref 36.0–46.0)
Hemoglobin: 12.6 g/dL (ref 12.0–15.0)
Immature Granulocytes: 0 %
Lymphocytes Relative: 14 %
Lymphs Abs: 0.7 10*3/uL (ref 0.7–4.0)
MCH: 30.4 pg (ref 26.0–34.0)
MCHC: 32.1 g/dL (ref 30.0–36.0)
MCV: 94.7 fL (ref 80.0–100.0)
Monocytes Absolute: 0.5 10*3/uL (ref 0.1–1.0)
Monocytes Relative: 11 %
Neutro Abs: 3.4 10*3/uL (ref 1.7–7.7)
Neutrophils Relative %: 72 %
Platelet Count: 264 10*3/uL (ref 150–400)
RBC: 4.15 MIL/uL (ref 3.87–5.11)
RDW: 14.2 % (ref 11.5–15.5)
WBC Count: 4.7 10*3/uL (ref 4.0–10.5)
nRBC: 0 % (ref 0.0–0.2)

## 2021-05-03 MED ORDER — TAMOXIFEN CITRATE 20 MG PO TABS: 20.0000 mg | ORAL_TABLET | Freq: Every day | ORAL | 4 refills | Status: AC

## 2021-05-04 ENCOUNTER — Ambulatory Visit
Admission: RE | Admit: 2021-05-04 | Discharge: 2021-05-04 | Disposition: A | Discharge status: Home / Self Care | Payer: 59 | Source: Ambulatory Visit | Attending: Radiation Oncology | Admitting: Radiation Oncology

## 2021-05-04 ENCOUNTER — Encounter: Payer: Self-pay | Admitting: Internal Medicine

## 2021-05-04 ENCOUNTER — Ambulatory Visit (INDEPENDENT_AMBULATORY_CARE_PROVIDER_SITE_OTHER): Payer: 59 | Admitting: Internal Medicine

## 2021-05-04 VITALS — BP 110/66 | HR 97 | Ht 63.0 in | Wt 179.0 lb

## 2021-05-04 DIAGNOSIS — E785 Hyperlipidemia, unspecified: Secondary | ICD-10-CM | POA: Diagnosis not present

## 2021-05-04 DIAGNOSIS — E1169 Type 2 diabetes mellitus with other specified complication: Secondary | ICD-10-CM | POA: Diagnosis not present

## 2021-05-04 DIAGNOSIS — I2510 Atherosclerotic heart disease of native coronary artery without angina pectoris: Secondary | ICD-10-CM | POA: Diagnosis not present

## 2021-05-04 DIAGNOSIS — Z510 Encounter for antineoplastic radiation therapy: Secondary | ICD-10-CM | POA: Diagnosis not present

## 2021-05-04 NOTE — Patient Instructions (Signed)
Medication Instructions:   Your physician has recommended you make the following change in your medication:  STOP Plavix  CONTINUE taking your Aspirin 68m daily  *If you need a refill on your cardiac medications before your next appointment, please call your pharmacy*   Lab Work:  None ordered  Testing/Procedures:  None ordered   Follow-Up: [facility] you and your health needs are our priority.  As part of our continuing mission to provide you with exceptional heart care, we have created designated Provider Care Teams.  These Care Teams include your primary Cardiologist (physician) and Advanced Practice Providers (APPs -  Physician Assistants and Nurse Practitioners) who all work together to provide you with the care you need, when you need it.  We recommend signing up for the patient portal called "MyChart".  Sign up information is provided on this After Visit Summary.  MyChart is used to connect with patients for Virtual Visits (Telemedicine).  Patients are able to view lab/test results, encounter notes, upcoming appointments, etc.  Non-urgent messages can be sent to your provider as well.   To learn more about what you can do with MyChart, go to [email or web address]    Your next appointment:   6 month(s)  The format for your next appointment:   In Person  Provider:   You may see CNelva Bush MD or one of the following Advanced Practice Providers on your designated Care Team:   CMurray Hodgkins NP RChristell Faith PA-C JMarrianne Mood PA-C Cadence FHollister PVermontCLaurann Montana NP

## 2021-05-05 ENCOUNTER — Encounter: Payer: Self-pay | Admitting: Internal Medicine

## 2021-05-05 ENCOUNTER — Telehealth: Payer: Self-pay | Admitting: Oncology

## 2021-05-05 DIAGNOSIS — E1169 Type 2 diabetes mellitus with other specified complication: Secondary | ICD-10-CM | POA: Insufficient documentation

## 2021-05-05 DIAGNOSIS — E785 Hyperlipidemia, unspecified: Secondary | ICD-10-CM | POA: Insufficient documentation

## 2021-05-05 NOTE — Telephone Encounter (Signed)
Scheduled appointment per 06/28 sch msg. Patient is aware.

## 2021-05-13 ENCOUNTER — Other Ambulatory Visit: Payer: Self-pay | Admitting: Oncology

## 2021-05-15 ENCOUNTER — Encounter: Payer: Self-pay | Admitting: Hematology

## 2021-05-17 IMAGING — CR DG CHEST 1V PORT
1 series · 1 of 1 positions shown · non-contrast
Comparison: September 14, 2017.

CLINICAL DATA: Status post Port-A-Cath placement.

EXAM:
PORTABLE CHEST 1 VIEW

[chest ap]
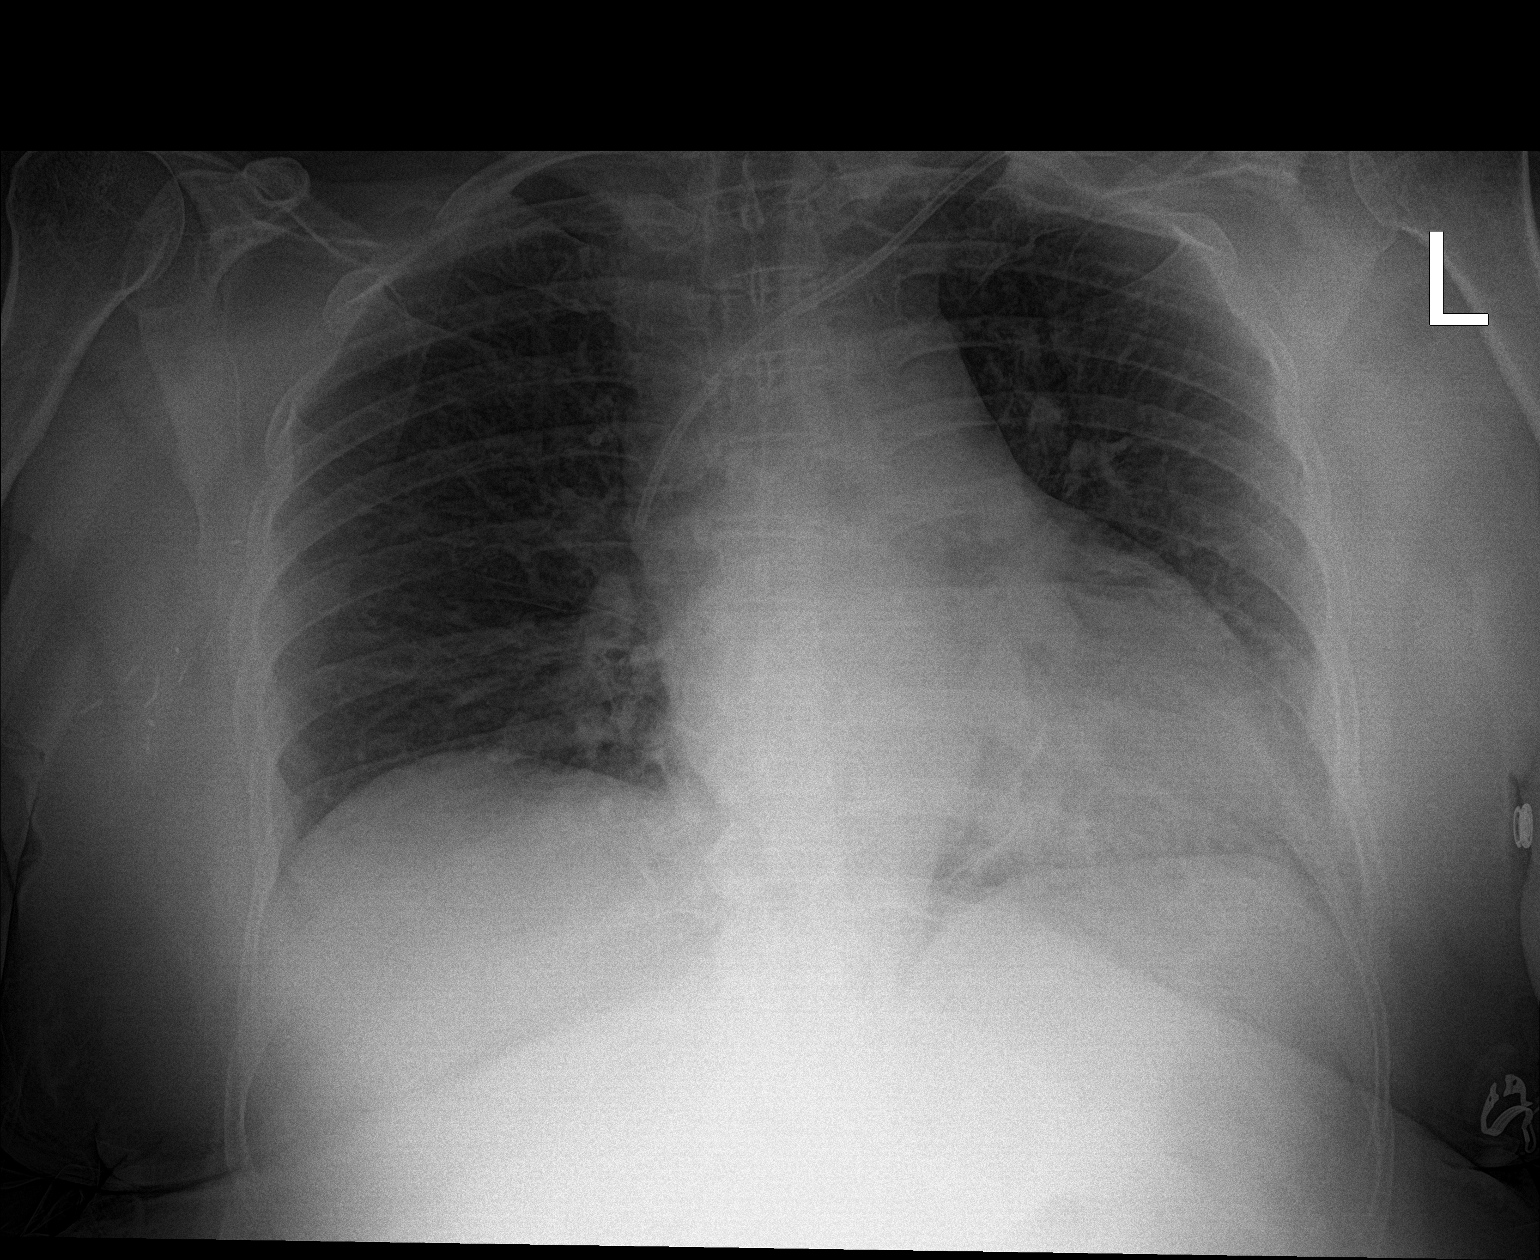

[1 of 1 positions shown; findings below may reference images not displayed]

FINDINGS: Mild cardiomegaly is noted. No pneumothorax or pleural effusion is
noted. Interval placement of left subclavian catheter with distal
tip in expected position of the SVC. Lungs are clear. Bony thorax is
unremarkable.
IMPRESSION: Interval placement of left subclavian catheter with distal tip in
expected position of the SVC. No pneumothorax is noted.

## 2021-06-01 ENCOUNTER — Ambulatory Visit
Admission: RE | Admit: 2021-06-01 | Discharge: 2021-06-01 | Disposition: A | Discharge status: Home / Self Care | Payer: 59 | Source: Ambulatory Visit | Attending: Radiation Oncology | Admitting: Radiation Oncology

## 2021-06-01 ENCOUNTER — Encounter: Payer: Self-pay | Admitting: Radiation Oncology

## 2021-06-01 VITALS — BP 129/72 | HR 93 | Temp 97.6°F | Resp 16 | Wt 176.4 lb

## 2021-06-01 DIAGNOSIS — Z923 Personal history of irradiation: Secondary | ICD-10-CM | POA: Insufficient documentation

## 2021-06-01 DIAGNOSIS — Z79810 Long term (current) use of selective estrogen receptor modulators (SERMs): Secondary | ICD-10-CM | POA: Insufficient documentation

## 2021-06-01 DIAGNOSIS — Z170 Estrogen receptor positive status [ER+]: Secondary | ICD-10-CM | POA: Diagnosis not present

## 2021-06-01 DIAGNOSIS — C50411 Malignant neoplasm of upper-outer quadrant of right female breast: Secondary | ICD-10-CM | POA: Diagnosis present

## 2021-06-01 NOTE — Progress Notes (Signed)
Radiation Oncology Follow up Note  Name: Cassandra Cox   Date:   06/01/2021 MRN:  [id number] DOB: Oct 14, 1961    This 60 y.o. female presents to the clinic today for 1 month follow-up status post whole breast radiation to her right breast for a T1N1 ER/PR positive HER2 negative invasive mammary carcinoma status post wide local excision and adjuvant chemotherapy stage Ib.  REFERRING PROVIDER: No ref. provider found  HPI: Patient is a 60 year old female now out 1 month having completed whole breast radiation to her right breast status post wide local excision and sentinel node biopsy and adjuvant chemotherapy for stage Ib ER/PR positive HER2 negative invasive mammary carcinoma seen today in routine follow-up she is doing well.  She specifically denies breast tenderness cough or bone pain.  She has been started on.  Tamoxifen is tolerating it well without side effect.  COMPLICATIONS OF TREATMENT: none  FOLLOW UP COMPLIANCE: keeps appointments   PHYSICAL EXAM:  BP 129/72 (BP Location: Left Arm, Patient Position: Sitting)   Pulse 93   Temp 97.6 F (36.4 C) (Tympanic)   Resp 16   Wt 176 lb 6.4 oz (80 kg)   BMI 31.25 kg/m  Lungs are clear to A&P cardiac examination essentially unremarkable with regular rate and rhythm. No dominant mass or nodularity is noted in either breast in 2 positions examined. Incision is well-healed. No axillary or supraclavicular adenopathy is appreciated. Cosmetic result is excellent.  Well-developed well-nourished patient in NAD. HEENT reveals PERLA, EOMI, discs not visualized.  Oral cavity is clear. No oral mucosal lesions are identified. Neck is clear without evidence of cervical or supraclavicular adenopathy. Lungs are clear to A&P. Cardiac examination is essentially unremarkable with regular rate and rhythm without murmur rub or thrill. Abdomen is benign with no organomegaly or masses noted. Motor sensory and DTR levels are equal and symmetric in the upper and lower  extremities. Cranial nerves II through XII are grossly intact. Proprioception is intact. No peripheral adenopathy or edema is identified. No motor or sensory levels are noted. Crude visual fields are within normal range.  RADIOLOGY RESULTS: No current films for review  PLAN: Present time patient is doing well 1 month out from whole breast radiation and pleased with her overall progress.  She continues on tamoxifen without side effect.  I have asked to see her back in 4 to 5 months for follow-up.  She has question me about removing her port I have asked her to refer to medical oncology in [place].  Patient knows to call with any concerns.  I would like to take this opportunity to thank you for allowing me to participate in the care of your patient.Noreene Filbert, MD

## 2021-06-10 ENCOUNTER — Telehealth: Payer: Self-pay | Admitting: *Deleted

## 2021-06-10 NOTE — Telephone Encounter (Signed)
This RN spoke with pt per her call inquiring about " when can I get my port removed. ?"  Pt has completed chemo and radiation.  This RN informed above request will be forwarded to her surgeon, Dr Marlou Starks for scheduling removal.

## 2021-07-14 ENCOUNTER — Telehealth: Payer: Self-pay | Admitting: *Deleted

## 2021-07-14 MED ORDER — CEPHALEXIN 500 MG PO CAPS: 500.0000 mg | ORAL_CAPSULE | Freq: Two times a day (BID) | ORAL | 0 refills | Status: DC

## 2021-07-14 NOTE — Telephone Encounter (Signed)
This RN received VM from pt and from [facility] [place] - both stating the same concern for mastitis noted by Dr Luan Pulling per her mammogram appt yesterday.  Inquiry is for need of antibiotic ( recommended per Dr Luan Pulling ) and if Dr Jannifer Rodney can order.  Per MD review prescription given for Keflex and request for follow up visit next week for evaluation of therapy.  This RN called pt and discussed the above - verified pharmacy and appt made for appt next week with LCC/NP.  Prescription sent to verified pharmacy.

## 2021-07-18 ENCOUNTER — Other Ambulatory Visit: Payer: Self-pay

## 2021-07-18 ENCOUNTER — Ambulatory Visit: Payer: 59 | Attending: General Surgery

## 2021-07-18 ENCOUNTER — Encounter: Payer: Self-pay | Admitting: Adult Health

## 2021-07-18 ENCOUNTER — Inpatient Hospital Stay: Payer: 59 | Attending: Genetic Counselor | Admitting: Adult Health

## 2021-07-18 VITALS — Wt 174.4 lb

## 2021-07-18 VITALS — BP 133/67 | HR 96 | Temp 97.9°F | Resp 18 | Ht 63.0 in | Wt 175.6 lb

## 2021-07-18 DIAGNOSIS — Z79899 Other long term (current) drug therapy: Secondary | ICD-10-CM | POA: Diagnosis not present

## 2021-07-18 DIAGNOSIS — Z79810 Long term (current) use of selective estrogen receptor modulators (SERMs): Secondary | ICD-10-CM | POA: Diagnosis not present

## 2021-07-18 DIAGNOSIS — Z170 Estrogen receptor positive status [ER+]: Secondary | ICD-10-CM | POA: Insufficient documentation

## 2021-07-18 DIAGNOSIS — C50411 Malignant neoplasm of upper-outer quadrant of right female breast: Secondary | ICD-10-CM | POA: Insufficient documentation

## 2021-07-18 DIAGNOSIS — Z483 Aftercare following surgery for neoplasm: Secondary | ICD-10-CM | POA: Insufficient documentation

## 2021-07-18 NOTE — Therapy (Signed)
[facility] [place], [place], [address] Phone: [phone]   Fax:  [phone]  Physical Therapy Treatment  Patient Details  Name: Cassandra Cox MRN: [id number] Date of Birth: [birth year] Referring Provider (PT): Dr. Autumn Messing   Encounter Date: 07/18/2021   PT End of Session - 07/18/21 1627     Visit Number 3   # unchanged due to screen only   Number of Visits 3    PT Start Time 1622    PT Stop Time 1630    PT Time Calculation (min) 8 min    Activity Tolerance Patient tolerated treatment well    Behavior During Therapy [facility] for tasks assessed/performed             Past Medical History:  Diagnosis Date   Anxiety    Complication of anesthesia    Coronary artery disease    a. 11/2016 Cath/PCI: LM nl, LAD 30ost, 83m(3.0x18 Resolute Integrity DES--3.75), LCX nl, RCA nl; b. MV 11/18: negative for ischemia, EF 72%, low risk   Depression    Diabetes mellitus without complication ([facility]    type 2   Diastolic dysfunction    a. 11/2016 Echo: EF 55-60%, Gr2 DD, LAE, nl RV size/fxn.   Family history of breast cancer    History of kidney stones    Hyperlipemia \\[id number]\  Hypothyroidism    Kidney stone 08/2016   Mitral valve prolapse    MVP (mitral valve prolapse)    Neuromuscular disorder (HCC)    bilateral feet   PONV (postoperative nausea and vomiting)    STEMI (ST elevation myocardial infarction) ([facility] 12/02/2016   Vitamin B 12 deficiency     Past Surgical History:  Procedure Laterality Date   ABDOMINAL HYSTERECTOMY  2003   TAH due to adenomyosis   BREAST LUMPECTOMY WITH RADIOACTIVE SEED AND SENTINEL LYMPH NODE BIOPSY Right 10/04/2020   Procedure: RIGHT BREAST REDUCTION LUMPECTOMY WITH RADIOACTIVE SEED AND SENTINEL LYMPH NODE BIOPSY;  Surgeon: TJovita Kussmaul MD;  [facility]  Service: General;  Laterality: Right;   BREAST REDUCTION SURGERY  1990   BREAST REDUCTION SURGERY  Bilateral 10/04/2020   Procedure: BILATERAL MAMMARY REDUCTION  (BREAST);  Surgeon: DWallace Going DO;  [place] [facility]  Service: Plastics;  Laterality: Bilateral;   CARDIAC CATHETERIZATION N/A 12/02/2016   Procedure: Coronary Stent Intervention;  Surgeon: CNelva Bush MD;  [facility];  Service: Cardiovascular;  Laterality: N/A;   CARDIAC CATHETERIZATION N/A 12/02/2016   Procedure: Left Heart Cath and Coronary Angiography;  Surgeon: CNelva Bush MD;  [facility];  Service: Cardiovascular;  Laterality: N/A;   CARDIAC CATHETERIZATION N/A 12/02/2016   Procedure: Intravascular Ultrasound/IVUS;  Surgeon: CNelva Bush MD;  [facility];  Service: Cardiovascular;  Laterality: N/A;   CORONARY STENT INTERVENTION     MOUTH SURGERY     Dental implant   PORTACATH PLACEMENT Left 12/13/2020   Procedure: INSERTION PORT-A-CATH LEFT SUBCLAVIAN;  Surgeon: TJovita Kussmaul MD;  [place] [facility]  Service: General;  Laterality: Left;    Vitals:   07/18/21 1624  Weight: 174 lb 6 oz (79.1 kg)     Subjective Assessment - 07/18/21 1624     Subjective Pt returns for her 3 month L-Dex screen.    Pertinent History Patient was diagnosed on 06/30/2020 with right grade III invasive ductal carcinoma breast cancer. Patient underwent a right  lumpectomy and sentinel node biopsy (1/3 nodes positive) on 10/04/2020 with a bilateral reduction. It is ER/PR positive and HER2 negative with a Ki67 of 30%.                    L-DEX FLOWSHEETS - 07/18/21 1600       L-DEX LYMPHEDEMA SCREENING   Measurement Type Unilateral    L-DEX MEASUREMENT EXTREMITY Upper Extremity    POSITION  Standing    DOMINANT SIDE Right    At Risk Side Right    BASELINE SCORE (UNILATERAL) -2.2                                     PT Long Term Goals - 11/25/20 1135       PT LONG TERM GOAL #1   Title  Patient will demonstrate she has regained full shoulder ROM and function post operatively compared to baselines.    Time 8    Period Weeks    Status Achieved                   Plan - 07/18/21 1628     Clinical Impression Statement Pt returns for her 3 month L-Dex screen and reports she has Rt breast lymphedema. Her change from baseline of 8 is indicative of subclinical lymphedema. Pt was measured for a compression sleeve and issued a size III which was a good fit and pt reports compfortable. She was instructed in proper donning and doffing techniques, not to sleep in this and to wear for 10 hrs/day for next month. She verbalized good understanding and knows she can call our clinic with any other questions.    PT Next Visit Plan Cont every 3 month L-Dex screens for up to 2 years from her SLNB.    Consulted and Agree with Plan of Care Patient             Patient will benefit from skilled therapeutic intervention in order to improve the following deficits and impairments:     Visit Diagnosis: Aftercare following surgery for neoplasm     Problem List Patient Active Problem List   Diagnosis Date Noted   Hyperlipidemia associated with type 2 diabetes mellitus ([place]) 05/05/2021   Genetic testing 01/11/2021   Port-A-Cath in place 12/14/2020   Family history of breast cancer    Malignant neoplasm of upper-outer quadrant of right breast in female, estrogen receptor positive ([place]) 08/13/2020   Shortness of breath 02/19/2020   Essential hypertension 09/09/2018   Influenza B 10/18/2017   Unstable angina ([place]) 09/14/2017   Coronary artery disease involving native coronary artery of native heart without angina pectoris 07/18/2017   Insomnia 08/25/2015   Hot flashes, menopausal 08/25/2015   Well woman exam 07/22/2015   Family history of diabetes mellitus 07/15/2014   Vitamin D deficiency 11/03/2009   Hyperlipidemia LDL goal <70 11/03/2009   ANXIETY 11/03/2009   Depression  11/03/2009    [name], PTA 07/18/2021, 5:17 PM  [facility] [place], [place], [address] Phone: [phone]   Fax:  [phone]  Name: Cassandra Cox MRN: [id number] Date of Birth: 12/16/1960

## 2021-07-18 NOTE — Progress Notes (Signed)
Cassandra Cox  [phone]) [phone] Fax:(336) [phone]     ID: Cassandra Cox DOB: 1961/06/05  MR#: [id number]  VEH#:[id number]  Patient Care Team: Pcp, No as PCP - General End, Harrell Gave, MD as PCP - Cardiology (Cardiology) Mauro Kaufmann, RN as Oncology Nurse Navigator Rockwell Germany, RN as Oncology Nurse Navigator Jovita Kussmaul, MD as Consulting Physician (General Surgery) Truitt Merle, MD as Consulting Physician (Hematology) Kyung Rudd, MD as Consulting Physician (Radiation Oncology) Scot Dock, NP OTHER MD:  CHIEF COMPLAINT: estrogen receptor positive breast cancer  CURRENT TREATMENT: Tamoxifen   INTERVAL HISTORY: Jilliam returns today for follow up of her estrogen receptor positive breast cancer.  She completed adjuvant radiation approximately 12 weeks ago.    Areliz underwent mammogram on 07/13/2021 with Dr. Luan Pulling who became concerned that [name] had mastitis.  She was prescribed keflex by Dr. Jana Hakim and given an appt to come in for f/u today.    REVIEW OF SYSTEMS: Review of Systems  Constitutional:  Negative for appetite change, chills, fatigue, fever and unexpected weight change.  HENT:   Negative for hearing loss, lump/mass and trouble swallowing.   Eyes:  Negative for eye problems and icterus.  Respiratory:  Negative for chest tightness, cough and shortness of breath.   Cardiovascular:  Negative for chest pain, leg swelling and palpitations.  Gastrointestinal:  Negative for abdominal distention, abdominal pain, constipation, diarrhea, nausea and vomiting.  Endocrine: Negative for hot flashes.  Genitourinary:  Negative for difficulty urinating.   Musculoskeletal:  Negative for arthralgias.  Skin:  Negative for itching and rash.  Neurological:  Negative for dizziness, extremity weakness, headaches and numbness.  Hematological:  Negative for adenopathy. Does not bruise/bleed easily.  Psychiatric/Behavioral:  Negative for depression. The  patient is not nervous/anxious.     COVID 19 VACCINATION STATUS: Refuses vaccination   HISTORY OF CURRENT ILLNESS: From my original evaluation note:  ROSALIE Cox had routine screening mammography on 06/30/2020 showing a possible abnormality in the right breast. She underwent right diagnostic mammography with tomography and right breast ultrasonography at [facility] on 07/20/2020 showing: breast density category C; 1.1 cm irregular mass in right breast at 10-11 o'clock, better seen on mammogram; no suspicious right axillary lymph nodes.  Accordingly on 08/09/2020 she proceeded to biopsy of the right breast area in question. The pathology from this procedure  showed: invasive and in situ mammary carcinoma, e-cadherin positive, grade 3. Prognostic indicators significant for: estrogen receptor, 95% positive and progesterone receptor, 90% positive. Proliferation marker Ki67 at 30%. HER2 negative by immunohistochemistry.  She underwent right lumpectomy on 10/04/2020 under Dr. Marlou Starks. Pathology from the procedure [phone]) showed: invasive ductal carcinoma, grade 2, 1.2 cm; ductal carcinoma in situ, intermediate grade; calcifications associated with carcinoma; margins uninvolved; lymphovascular space invasion present.  Out of three biopsied lymph nodes, one revealed metastatic carcinoma (1/3). The positive lymph node showed a disrupted capsule that is suspicious for extracapsular extension.  Mammaprint was performed on the final surgical sample and revealed high risk.  The patient's subsequent history is as detailed below.   PAST MEDICAL HISTORY: Past Medical History:  Diagnosis Date   Anxiety    Complication of anesthesia    Coronary artery disease    a. 11/2016 Cath/PCI: LM nl, LAD 30ost, 80m(3.0x18 Resolute Integrity DES--3.75), LCX nl, RCA nl; b. MV 11/18: negative for ischemia, EF 72%, low risk   Depression    [place]    type 2   Diastolic  dysfunction     a. 11/2016 Echo: EF 55-60%, Gr2 DD, LAE, nl RV size/fxn.   Family history of breast cancer    History of kidney stones    Hyperlipemia \[id number]\   Hypothyroidism    Kidney stone 08/2016   Mitral valve prolapse    MVP (mitral valve prolapse)    Neuromuscular disorder (HCC)    bilateral feet   PONV (postoperative nausea and vomiting)    STEMI (ST elevation myocardial infarction) ([place]) 12/02/2016   Vitamin B 12 deficiency     PAST SURGICAL HISTORY: Past Surgical History:  Procedure Laterality Date   ABDOMINAL HYSTERECTOMY  2003   TAH due to adenomyosis   BREAST LUMPECTOMY WITH RADIOACTIVE SEED AND SENTINEL LYMPH NODE BIOPSY Right 10/04/2020   Procedure: RIGHT BREAST REDUCTION LUMPECTOMY WITH RADIOACTIVE SEED AND SENTINEL LYMPH NODE BIOPSY;  Surgeon: Jovita Kussmaul, MD;  Location: [place];  Service: General;  Laterality: Right;   BREAST REDUCTION SURGERY  1990   BREAST REDUCTION SURGERY Bilateral 10/04/2020   Procedure: BILATERAL MAMMARY REDUCTION  (BREAST);  Surgeon: Wallace Going, DO;  Location: [place];  Service: Plastics;  Laterality: Bilateral;   CARDIAC CATHETERIZATION N/A 12/02/2016   Procedure: Coronary Stent Intervention;  Surgeon: Nelva Bush, MD;  Location: [place] [facility];  Service: Cardiovascular;  Laterality: N/A;   CARDIAC CATHETERIZATION N/A 12/02/2016   Procedure: Left Heart Cath and Coronary Angiography;  Surgeon: Nelva Bush, MD;  Location: [place] [facility];  Service: Cardiovascular;  Laterality: N/A;   CARDIAC CATHETERIZATION N/A 12/02/2016   Procedure: Intravascular Ultrasound/IVUS;  Surgeon: Nelva Bush, MD;  Location: [place] [facility];  Service: Cardiovascular;  Laterality: N/A;   CORONARY STENT INTERVENTION     MOUTH SURGERY     Dental implant   PORTACATH PLACEMENT Left 12/13/2020   Procedure: INSERTION PORT-A-CATH LEFT SUBCLAVIAN;  Surgeon: Jovita Kussmaul, MD;  Location: [place];  Service: General;  Laterality: Left;    FAMILY HISTORY: Family History  Problem Relation Age of Onset   Heart disease Mother    Diabetes Mother    Stroke Mother    Heart attack Mother    Atrial fibrillation Mother    Hypertension Mother    Hyperlipidemia Mother    COPD Father    Hypertension Father    Hyperlipidemia Father    Breast cancer Maternal Aunt 65       x2 age 105   Diabetes Maternal Grandfather    Her father died at age 23 and her mother at age 63. Ania is an only child. She reports breast cancer in a maternal aunt two separate times, in her 8's and then in her 25's.   GYNECOLOGIC HISTORY:  No LMP recorded. Patient has had a hysterectomy. Menarche: 60 years old Age at first live birth: 60 years old [place] P 2 LMP 07/1997 (having periods at time of hysterectomy) Contraceptive: used oral contraceptives for 17 years without complications ([id number]) HRT used progesterone for 2-3 years, stopped with cancer diagnosis Hysterectomy? Yes, 1998 for adenomyosis BSO? 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 She lives by herself with her dachshund. Daughter Carmell [place] is 20, lives in [place], and is a homemaker; she suffers from [employer]. Daughter Tanzania is 29, lives in [place] and works as a Psychologist, educational.  The patient has 1 grandchild.  She is a Psychologist, forensic.     ADVANCED DIRECTIVES:  HEALTH MAINTENANCE: Social History   Tobacco Use   Smoking status: Never   Smokeless tobacco: Never  Vaping Use   Vaping Use: Never used  Substance Use Topics   Alcohol use: No   Drug use: No     Colonoscopy:   PAP:   Bone density:    Allergies  Allergen Reactions   Prednisone Other (See Comments)    REACTION: Swelling REACTION: Swelling Facial swelling REACTION: Swelling   Sulfa Antibiotics Hives and Other (See Comments)    REACTION: Hives   Sulfasalazine     REACTION: Hives   Sulfonamide  Derivatives     REACTION: Hives    Current Outpatient Medications  Medication Sig Dispense Refill   ALPRAZolam (XANAX) 0.25 MG tablet Take 0.25 mg by mouth 3 (three) times daily as needed for anxiety.     ARMOUR THYROID 60 MG tablet Take 60 mg by mouth daily.     aspirin EC 81 MG tablet Take 81 mg by mouth daily.     atorvastatin (LIPITOR) 80 MG tablet TAKE 1 TABLET BY MOUTH DAILY AT 6PM GENERIC EQUIVALENT FOR LIPITOR 90 tablet 1   buPROPion (WELLBUTRIN XL) 150 MG 24 hr tablet Take 300 mg by mouth daily.      cephALEXin (KEFLEX) 500 MG capsule Take 1 capsule (500 mg total) by mouth 2 (two) times daily. 14 capsule 0   Cholecalciferol (VITAMIN D-3) 5000 UNITS TABS Take 1 tablet by mouth daily.     ezetimibe (ZETIA) 10 MG tablet TAKE 1 TABLET BY MOUTH EVERY DAY 90 tablet 1   FLUoxetine (PROZAC) 20 MG capsule Take 20 mg by mouth daily.      HYDROcodone-acetaminophen (NORCO/VICODIN) 5-325 MG tablet Take 1-2 tablets by mouth every 6 (six) hours as needed for moderate pain or severe pain. 10 tablet 0   levocetirizine (XYZAL) 5 MG tablet Take 1 tablet at bedtime by mouth.  2   liothyronine (CYTOMEL) 5 MCG tablet Take 5 mcg by mouth every morning.      magnesium gluconate (MAGONATE) 500 MG tablet Take 1,000 mg by mouth at bedtime.     metFORMIN (GLUCOPHAGE-XR) 500 MG 24 hr tablet Take 500 mg by mouth 2 (two) times daily.     montelukast (SINGULAIR) 10 MG tablet Take 10 mg by mouth every morning.     nitroGLYCERIN (NITROSTAT) 0.4 MG SL tablet Place 1 tablet (0.4 mg total) under the tongue every 5 (five) minutes as needed for chest pain. For maximum of 3 doses. 25 tablet 3   valACYclovir (VALTREX) 500 MG tablet TAKE 1 TABLET (500 MG TOTAL) BY MOUTH DAILY. 90 tablet 1   vitamin C (ASCORBIC ACID) 500 MG tablet Take 500 mg by mouth daily.     zinc gluconate 50 MG tablet Take 50 mg by mouth daily.     zolpidem (AMBIEN CR) 12.5 MG CR tablet Take 12.5 mg by mouth at bedtime as needed for sleep.      Cyanocobalamin 1000 MCG/ML KIT Inject 1,000 mcg as directed. Every other week (Patient not taking: Reported on 07/18/2021)     No current facility-administered medications for this visit.    OBJECTIVE:  Vitals:   07/18/21 1359  BP: 133/67  Pulse: 96  Resp: 18  Temp: 97.9 F (36.6 C)  SpO2: 100%      Body mass index is 31.11 kg/m.   Wt Readings from Last 3 Encounters:  07/18/21 174 lb 6 oz (79.1 kg)  07/18/21 175 lb 9.6 oz (79.7  kg)  06/01/21 176 lb 6.4 oz (80 kg)   ECOG FS:1 - Symptomatic but completely ambulatory GENERAL: Patient is a well appearing female in no acute distress HEENT:  Sclerae anicteric.  Oropharynx clear and moist. No ulcerations or evidence of oropharyngeal candidiasis. Neck is supple.  NODES:  No cervical, supraclavicular, or axillary lymphadenopathy palpated.  BREAST EXAM:  left breast with faint erythema, mild swelling, right breast benign LUNGS:  Clear to auscultation bilaterally.  No wheezes or rhonchi. HEART:  Regular rate and rhythm. No murmur appreciated. ABDOMEN:  Soft, nontender.  Positive, normoactive bowel sounds. No organomegaly palpated. MSK:  No focal spinal tenderness to palpation. Full range of motion bilaterally in the upper extremities. EXTREMITIES:  No peripheral edema.   SKIN:  Clear with no obvious rashes or skin changes. No nail dyscrasia. NEURO:  Nonfocal. Well oriented.  Appropriate affect.    LAB RESULTS:  CMP     Component Value Date/Time   NA 140 05/03/2021 1134   NA 140 03/03/2020 1052   K 4.4 05/03/2021 1134   CL 105 05/03/2021 1134   CO2 24 05/03/2021 1134   GLUCOSE 169 (H) 05/03/2021 1134   BUN 16 05/03/2021 1134   BUN 18 03/03/2020 1052   CREATININE 0.74 05/03/2021 1134   CALCIUM 9.4 05/03/2021 1134   PROT 6.7 05/03/2021 1134   PROT 6.5 03/03/2020 1052   ALBUMIN 3.6 05/03/2021 1134   ALBUMIN 4.4 03/03/2020 1052   AST 20 05/03/2021 1134   ALT 32 05/03/2021 1134   ALKPHOS 93 05/03/2021 1134   BILITOT 0.4  05/03/2021 1134   GFRNONAA >60 05/03/2021 1134   GFRAA 111 03/03/2020 1052    No results found for: TOTALPROTELP, ALBUMINELP, A1GS, A2GS, BETS, BETA2SER, GAMS, MSPIKE, SPEI  Lab Results  Component Value Date   WBC 4.7 05/03/2021   NEUTROABS 3.4 05/03/2021   HGB 12.6 05/03/2021   HCT 39.3 05/03/2021   MCV 94.7 05/03/2021   PLT 264 05/03/2021    No results found for: LABCA2  No components found for: [id number]  No results for input(s): INR in the last 168 hours.  No results found for: LABCA2  No results found for: EUM353  No results found for: IRW431  No results found for: VQM086  No results found for: CA2729  No components found for: HGQUANT  No results found for: CEA1 / No results found for: CEA1   No results found for: AFPTUMOR  No results found for: CHROMOGRNA  No results found for: KPAFRELGTCHN, LAMBDASER, KAPLAMBRATIO (kappa/lambda light chains)  No results found for: HGBA, HGBA2QUANT, HGBFQUANT, HGBSQUAN (Hemoglobinopathy evaluation)   No results found for: LDH  No results found for: IRON, TIBC, IRONPCTSAT (Iron and TIBC)  No results found for: FERRITIN  Urinalysis    Component Value Date/Time   COLORURINE RED (A) 08/28/2016 1428   APPEARANCEUR CLOUDY (A) 08/28/2016 1428   LABSPEC 1.017 08/28/2016 1428   PHURINE 7.0 08/28/2016 1428   GLUCOSEU NEGATIVE 08/28/2016 1428   HGBUR LARGE (A) 08/28/2016 1428   BILIRUBINUR NEG 09/25/2017 1443   KETONESUR 15 (A) 08/28/2016 1428   PROTEINUR NEG 09/25/2017 1443   PROTEINUR NEGATIVE 08/28/2016 1428   UROBILINOGEN negative 06/12/2016 1610   NITRITE NEG 09/25/2017 1443   NITRITE NEGATIVE 08/28/2016 1428   LEUKOCYTESUR Negative 09/25/2017 1443    STUDIES: No results found.   ELIGIBLE FOR AVAILABLE RESEARCH PROTOCOL: AET  ASSESSMENT: 60 y.o. [place] woman status post right lumpectomy and sentinel lymph node sampling 10/04/2020 for a pT1c  pN1, stage IB   invasive ductal carcinoma, grade 2, estrogen and  progesterone receptor positive, HER2 not amplified, with an MIB-1 of 30%  (a) a total of 3 right axillary lymph nodes removed, one positive with concern regarding extracapsular extension  (1) MammaPrint high risk predicts a significant benefit from chemotherapy  (2) adjuvant chemotherapy Cassandra Cox consist of cyclophosphamide and docetaxel every 21 days x 4 starting 12/14/2020, completed 02/15/2021  (a) patient used DigniCap successfully for hair preservation  (3) adjuvant radiation to be completed 05/04/2021  (4) tamoxifen to start 06/06/2021  (A) bone density  (B) s/p hysterectomy  (C) s/p OCP and HR for many years w/o complications   PLAN:  Ivalene is doing well today. I think her breast issue is likely breast lymphedema.  We placed an order for her to see PT and undergo evaluation and treatment.  She is seeing them this afternoon fo rher SOZO measurements.    Jamiee Cassandra Cox finish up the keflex.  She Cassandra Cox see Dr. Jana Hakim in 07/2021 for f/u and I Cassandra Cox see her in 10/2021 for her survivorship visit.  She knows to call for any questions that may arise between now and her next appointment.  We are happy to see her sooner if needed.  Total encounter time 20 minutes.Wilber Bihari, NP 07/20/21 12:58 PM Medical Oncology and Hematology [facility] [place], [place] [address] Tel. [phone]    Fax. [phone]   *Total Encounter Time as defined by the Centers for Medicare and Medicaid Services includes, in addition to the face-to-face time of a patient visit (documented in the note above) non-face-to-face time: obtaining and reviewing outside history, ordering and reviewing medications, tests or procedures, care coordination (communications with other health care professionals or caregivers) and documentation in the medical record.

## 2021-07-20 ENCOUNTER — Encounter: Payer: Self-pay | Admitting: Hematology

## 2021-07-25 ENCOUNTER — Other Ambulatory Visit: Payer: Self-pay | Admitting: Internal Medicine

## 2021-08-04 ENCOUNTER — Telehealth (HOSPITAL_BASED_OUTPATIENT_CLINIC_OR_DEPARTMENT_OTHER): Payer: 59 | Admitting: Oncology

## 2021-08-04 DIAGNOSIS — C50411 Malignant neoplasm of upper-outer quadrant of right female breast: Secondary | ICD-10-CM | POA: Diagnosis not present

## 2021-08-04 DIAGNOSIS — Z170 Estrogen receptor positive status [ER+]: Secondary | ICD-10-CM

## 2021-08-04 MED ORDER — GABAPENTIN 300 MG PO CAPS: 300.0000 mg | ORAL_CAPSULE | Freq: Every day | ORAL | 4 refills | Status: DC

## 2021-08-04 NOTE — Progress Notes (Signed)
Cassandra Cox  [phone] [phone]) [phone]     ID: Cassandra Cox DOB: [birth year]  MR#: [id number]  TLX#:[id number]  Patient Care Team: Pcp, No as PCP - General End, Harrell Gave, MD as PCP - Cardiology (Cardiology) Mauro Kaufmann, RN as Oncology Nurse Navigator Rockwell Germany, RN as Oncology Nurse Navigator Jovita Kussmaul, MD as Consulting Physician (General Surgery) Truitt Merle, MD as Consulting Physician (Hematology) Kyung Rudd, MD as Consulting Physician (Radiation Oncology) Chauncey Cruel, MD OTHER MD:  I connected with Cassandra Cox on 08/04/21 at 11:00 AM EDT by telephone visit and verified that I am speaking with the correct person using two identifiers.   I discussed the limitations, risks, security and privacy concerns of performing an evaluation and management service by telemedicine and the availability of in-person appointments. I also discussed with the patient that there may be a patient responsible charge related to this service. The patient expressed understanding and agreed to proceed.   Other persons participating in the visit and their role in the encounter: None  Patient's location: Daughters home Provider's location: Clinic   CHIEF COMPLAINT: estrogen receptor positive breast cancer  CURRENT TREATMENT: Tamoxifen   INTERVAL HISTORY: Cassandra Cox returns today for follow up of her estrogen receptor positive breast cancer.  She completed adjuvant radiation approximately 12 weeks ago.  She tolerated that well, with no significant complications in terms of her skin held up or how tired she was.  She started tamoxifen in August.  She has had problems with hot flashes.  They do not exactly wake her up because she takes Ambien every night but when she does wake up and she feels her neck is wet.  Cassandra Cox underwent mammogram on 07/13/2021 with Dr. Luan Pulling who became concerned that Cassandra Cox had mastitis.  She was prescribed keflex by Dr.  Jana Hakim and given an appt to come in for f/u today.    REVIEW OF SYSTEMS: Cassandra Cox is currently visiting her daughter in [place], where unfortunately Cassandra Cox is heading.  They have taken the appropriate precautions.  She tells me she is not exercising because it has been too hot and of course now is going to be too wet.  She says that she has been found to have some lymphedema, even though minimally, and she is using a compression bra and a compression sleeve.  She finds these comfortable.  A detailed review of systems was otherwise stable   COVID 19 VACCINATION STATUS: Refuses vaccination   HISTORY OF CURRENT ILLNESS: From my original evaluation note:  JULIANNA VANWAGNER had routine screening mammography on 06/30/2020 showing a possible abnormality in the right breast. She underwent right diagnostic mammography with tomography and right breast ultrason[facility] at [facility] on 07/20/2020 showing: breast density category C; 1.1 cm irregular mass in right breast at 10-11 o'clock, better seen on mammogram; no suspicious right axillary lymph nodes.  Accordingly on 08/09/2020 she proceeded to biopsy of the right breast area in question. The pathology from this procedure  showed: invasive and in situ mammary carcinoma, e-cadherin positive, grade 3. Prognostic indicators significant for: estrogen receptor, 95% positive and progesterone receptor, 90% positive. Proliferation marker Ki67 at 30%. HER2 negative by immunohistochemistry.  She underwent right lumpectomy on 10/04/2020 under Dr. Marlou Starks. Pathology from the procedure [id number]) showed: invasive ductal carcinoma, grade 2, 1.2 cm; ductal carcinoma in situ, intermediate grade; calcifications associated with carcinoma; margins uninvolved; lymphovascular space invasion present.  Out of three biopsied lymph nodes, one revealed metastatic carcinoma (1/3).  The positive lymph node showed a disrupted capsule that is suspicious for extracapsular extension.  Mammaprint was  performed on the final surgical sample and revealed high risk.  The patient's subsequent history is as detailed below.   PAST MEDICAL HISTORY: Past Medical History:  Diagnosis Date   Anxiety    Complication of anesthesia    Coronary artery disease    a. 11/2016 Cath/PCI: LM nl, LAD 30ost, 48m(3.0x18 Resolute Integrity DES--3.75), LCX nl, RCA nl; b. MV 11/18: negative for ischemia, EF 72%, low risk   Depression    Diabetes mellitus without complication ([facility]    type 2   Diastolic dysfunction    a. 11/2016 Echo: EF 55-60%, Gr2 DD, LAE, nl RV size/fxn.   Family history of breast cancer    History of kidney stones    Hyperlipemia \\[id number]\  Hypothyroidism    Kidney stone 08/2016   Mitral valve prolapse    MVP (mitral valve prolapse)    Neuromuscular disorder (HCC)    bilateral feet   PONV (postoperative nausea and vomiting)    [place]I [place]ST elevation myocardial infarction) [place] 12/02/2016   Vitamin B 12 deficiency     PAST SURGICAL HISTORY: Past Surgical History:  Procedure Laterality Date   ABDOMINAL HYSTERECTOMY  2003   TAH due to adenomyosis   BREAST LUMPECTOMY WITH RADIOACTIVE SEED AND SENTINEL LYMPH NODE BIOPSY Right 10/04/2020   Procedure: RIGHT BREAST REDUCTION LUMPECTOMY WITH RADIOACTIVE SEED AND SENTINEL LYMPH NODE BIOPSY;  Surgeon: TJovita Kussmaul MD;  [facility] [facility]  Service: General;  Laterality: Right;   BREAST REDUCTION SURGERY  1990   BREAST REDUCTION SURGERY Bilateral 10/04/2020   Procedure: BILATERAL MAMMARY REDUCTION  (BREAST);  Surgeon: DWallace Going DO;  [place] [facility]  Service: Plastics;  Laterality: Bilateral;   CARDIAC CATHETERIZATION N/A 12/02/2016   Procedure: Coronary Stent Intervention;  Surgeon: CNelva Bush MD;  [facility];  Service: Cardiovascular;  Laterality: N/A;   CARDIAC CATHETERIZATION N/A 12/02/2016   Procedure: Left Heart Cath and Coronary Angiography;  Surgeon:  CNelva Bush MD;  [facility];  Service: Cardiovascular;  Laterality: N/A;   CARDIAC CATHETERIZATION N/A 12/02/2016   Procedure: Intravascular Ultrasound/IVUS;  Surgeon: CNelva Bush MD;  [facility];  Service: Cardiovascular;  Laterality: N/A;   CORONARY STENT INTERVENTION     MOUTH SURGERY     Dental implant   PORTACATH PLACEMENT Left 12/13/2020   Procedure: INSERTION PORT-A-CATH LEFT SUBCLAVIAN;  Surgeon: TJovita Kussmaul MD;  [place] [facility]  Service: General;  Laterality: Left;    FAMILY HISTORY: Family History  Problem Relation Age of Onset   Heart disease Mother    Diabetes Mother    Stroke Mother    Heart attack Mother    Atrial fibrillation Mother    Hypertension Mother    Hyperlipidemia Mother    COPD Father    Hypertension Father    Hyperlipidemia Father    Breast cancer Maternal Aunt 636      x2 age 61  Diabetes Maternal Grandfather    Her father died at age 5655and her mother at age 61 AAdelineis an only child. She reports breast cancer in a maternal aunt two separate times, in her 673'sand then in her 770's   GYNECOLOGIC HISTORY:  No LMP recorded. Patient has had a hysterectomy. Menarche: 60years old Age at first live birth: 60  years old [place] P 2 LMP 07/1997 (having periods at time of hysterectomy) Contraceptive: used oral contraceptives for 17 years without complications ([id number]) HRT used progesterone for 2-3 years, stopped with cancer diagnosis Hysterectomy? Yes, 1998 for adenomyosis BSO? 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 She is divorced. She lives by herself with her dachshund. Daughter Cassandra Cox is 47, lives in [place], and is a homemaker; she suffers from [employer]. Daughter Cassandra Cox is 59, lives in [place] and works as a Psychologist, educational.  The patient has 1 grandchild.  She is a Psychologist, forensic.     ADVANCED DIRECTIVES:    HEALTH  MAINTENANCE: Social History   Tobacco Use   Smoking status: Never   Smokeless tobacco: Never  Vaping Use   Vaping Use: Never used  Substance Use Topics   Alcohol use: No   Drug use: No     Colonoscopy:   PAP:   Bone density:    Allergies  Allergen Reactions   Prednisone Other (See Comments)    REACTION: Swelling REACTION: Swelling Facial swelling REACTION: Swelling   Sulfa Antibiotics Hives and Other (See Comments)    REACTION: Hives   Sulfasalazine     REACTION: Hives   Sulfonamide Derivatives     REACTION: Hives    Current Outpatient Medications  Medication Sig Dispense Refill   ALPRAZolam (XANAX) 0.25 MG tablet Take 0.25 mg by mouth 3 (three) times daily as needed for anxiety.     ARMOUR THYROID 60 MG tablet Take 60 mg by mouth daily.     aspirin EC 81 MG tablet Take 81 mg by mouth daily.     atorvastatin (LIPITOR) 80 MG tablet TAKE 1 TABLET BY MOUTH DAILY AT 6PM GENERIC EQUIVALENT FOR LIPITOR 90 tablet 0   buPROPion (WELLBUTRIN XL) 150 MG 24 hr tablet Take 300 mg by mouth daily.      cephALEXin (KEFLEX) 500 MG capsule Take 1 capsule (500 mg total) by mouth 2 (two) times daily. 14 capsule 0   Cholecalciferol (VITAMIN D-3) 5000 UNITS TABS Take 1 tablet by mouth daily.     Cyanocobalamin 1000 MCG/ML KIT Inject 1,000 mcg as directed. Every other week (Patient not taking: Reported on 07/18/2021)     ezetimibe (ZETIA) 10 MG tablet TAKE 1 TABLET BY MOUTH EVERY DAY 90 tablet 1   FLUoxetine (PROZAC) 20 MG capsule Take 20 mg by mouth daily.      HYDROcodone-acetaminophen (NORCO/VICODIN) 5-325 MG tablet Take 1-2 tablets by mouth every 6 (six) hours as needed for moderate pain or severe pain. 10 tablet 0   levocetirizine (XYZAL) 5 MG tablet Take 1 tablet at bedtime by mouth.  2   liothyronine (CYTOMEL) 5 MCG tablet Take 5 mcg by mouth every morning.      magnesium gluconate (MAGONATE) 500 MG tablet Take 1,000 mg by mouth at bedtime.     metFORMIN (GLUCOPHAGE-XR) 500 MG 24 hr  tablet Take 500 mg by mouth 2 (two) times daily.     montelukast (SINGULAIR) 10 MG tablet Take 10 mg by mouth every morning.     nitroGLYCERIN (NITROSTAT) 0.4 MG SL tablet Place 1 tablet (0.4 mg total) under the tongue every 5 (five) minutes as needed for chest pain. For maximum of 3 doses. 25 tablet 3   valACYclovir (VALTREX) 500 MG tablet TAKE 1 TABLET (500 MG TOTAL) BY MOUTH DAILY. 90 tablet 1   vitamin C (ASCORBIC ACID) 500 MG tablet Take 500 mg  by mouth daily.     zinc gluconate 50 MG tablet Take 50 mg by mouth daily.     zolpidem (AMBIEN CR) 12.5 MG CR tablet Take 12.5 mg by mouth at bedtime as needed for sleep.     No current facility-administered medications for this visit.    OBJECTIVE:  There were no vitals filed for this visit.     There is no height or weight on file to calculate BMI.   Wt Readings from Last 3 Encounters:  07/18/21 174 lb 6 oz (79.1 kg)  07/18/21 175 lb 9.6 oz (79.7 kg)  06/01/21 176 lb 6.4 oz (80 kg)   Televisit 08/04/2021   LAB RESULTS:  CMP     Component Value Date/Time   NA 140 05/03/2021 1134   NA 140 03/03/2020 1052   K 4.4 05/03/2021 1134   CL 105 05/03/2021 1134   CO2 24 05/03/2021 1134   GLUCOSE 169 (H) 05/03/2021 1134   BUN 16 05/03/2021 1134   BUN 18 03/03/2020 1052   CREATININE 0.74 05/03/2021 1134   CALCIUM 9.4 05/03/2021 1134   PROT 6.7 05/03/2021 1134   PROT 6.5 03/03/2020 1052   ALBUMIN 3.6 05/03/2021 1134   ALBUMIN 4.4 03/03/2020 1052   AST 20 05/03/2021 1134   ALT 32 05/03/2021 1134   ALKPHOS 93 05/03/2021 1134   BILITOT 0.4 05/03/2021 1134   GFRNONAA >60 05/03/2021 1134   GFRAA 111 03/03/2020 1052    No results found for: TOTALPROTELP, ALBUMINELP, A1GS, A2GS, BETS, BETA2SER, GAMS, MSPIKE, SPEI  Lab Results  Component Value Date   WBC 4.7 05/03/2021   NEUTROABS 3.4 05/03/2021   HGB 12.6 05/03/2021   HCT 39.3 05/03/2021   MCV 94.7 05/03/2021   PLT 264 05/03/2021    No results found for: LABCA2  No components  found for: EHMCNO709  No results for input(s): INR in the last 168 hours.  No results found for: LABCA2  No results found for: GGE366  No results found for: QHU765  No results found for: YYT035  No results found for: CA2729  No components found for: HGQUANT  No results found for: CEA1 / No results found for: CEA1   No results found for: AFPTUMOR  No results found for: CHROMOGRNA  No results found for: KPAFRELGTCHN, LAMBDASER, KAPLAMBRATIO (kappa/lambda light chains)  No results found for: HGBA, HGBA2QUANT, HGBFQUANT, HGBSQUAN (Hemoglobinopathy evaluation)   No results found for: LDH  No results found for: IRON, TIBC, IRONPCTSAT (Iron and TIBC)  No results found for: FERRITIN  Urinalysis    Component Value Date/Time   COLORURINE RED (A) 08/28/2016 1428   APPEARANCEUR CLOUDY (A) 08/28/2016 1428   LABSPEC 1.017 08/28/2016 1428   PHURINE 7.0 08/28/2016 1428   GLUCOSEU NEGATIVE 08/28/2016 1428   HGBUR LARGE (A) 08/28/2016 1428   BILIRUBINUR NEG 09/25/2017 1443   KETONESUR 15 (A) 08/28/2016 1428   PROTEINUR NEG 09/25/2017 1443   PROTEINUR NEGATIVE 08/28/2016 1428   UROBILINOGEN negative 06/12/2016 1610   NITRITE NEG 09/25/2017 1443   NITRITE NEGATIVE 08/28/2016 1428   LEUKOCYTESUR Negative 09/25/2017 1443    STUDIES: No results found.   ELIGIBLE FOR AVAILABLE RESEARCH PROTOCOL: AET  ASSESSMENT: 60 y.o. [place] woman status post right lumpectomy and sentinel lymph node sampling 10/04/2020 for a pT1c pN1, stage IB   invasive ductal carcinoma, grade 2, estrogen and progesterone receptor positive, HER2 not amplified, with an MIB-1 of 30%  (a) a total of 3 right axillary lymph nodes removed, one  positive with concern regarding extracapsular extension  (1) MammaPrint high risk predicts a significant benefit from chemotherapy  (2) adjuvant chemotherapy consisting of cyclophosphamide and docetaxel every 21 days x 4 started 12/14/2020, completed 02/15/2021  (a) patient  used DigniCap successfully for hair preservation  (3) adjuvant radiation 03/15/2021 through 04/22/2021 under [place], right breast received 28 at treatments at 1.8 Pearline Cables to a total dose of 50.4  (4) tamoxifen started 06/06/2021  (A) bone density  (B) s/p hysterectomy  (C) s/p OCP and HR for many years w/o complications   PLAN: Courtlynn is coming up on 1 year from definitive surgery for her breast cancer with no evidence of disease recurrence.  This is favorable.  She started tamoxifen in August.  She is generally tolerating this well.  She is having more hot flashes than before.  She is already on Wellbutrin and Prozac so I do not want to add venlafaxine.  Instead we are going to give gabapentin a try.  We discussed the possible toxicities side effects and complications of this agent and she will take it only at bedtime.  She will let us know if she has any issues with that.  Her pharmacist alerted her to the interaction between Wellbutrin Prozac and tamoxifen.  I reassured her that while there is indeed a chemical interaction it does not affect tamoxifen's effectiveness and she should feel very confident taking that medication  She is already scheduled to see [place] in survivorship mid December and I will begin as well during that visit  Total encounter time 15 minutes.Virgie Dad Taahir Grisby MD  08/04/21 1:16 PM Medical Oncology and Hematology [facility] [place], [place] [address] Tel. [phone]    Fax. [phone]   *Total Encounter Time as defined by the Centers for Medicare and Medicaid Services includes, in addition to the face-to-face time of a patient visit (documented in the note above) non-face-to-face time: obtaining and reviewing outside history, ordering and reviewing medications, tests or procedures, care coordination (communications with other health care professionals or caregivers) and documentation in the medical record.

## 2021-08-09 ENCOUNTER — Ambulatory Visit: Payer: 59 | Admitting: Rehabilitation

## 2021-08-29 ENCOUNTER — Other Ambulatory Visit: Payer: Self-pay

## 2021-08-29 ENCOUNTER — Ambulatory Visit: Payer: 59 | Attending: Adult Health

## 2021-08-29 DIAGNOSIS — Z483 Aftercare following surgery for neoplasm: Secondary | ICD-10-CM | POA: Diagnosis not present

## 2021-08-29 DIAGNOSIS — R600 Localized edema: Secondary | ICD-10-CM | POA: Diagnosis present

## 2021-08-29 DIAGNOSIS — R293 Abnormal posture: Secondary | ICD-10-CM | POA: Insufficient documentation

## 2021-08-29 NOTE — Therapy (Signed)
[facility] @ [place], [place], [address] Phone: [phone]   Fax:  [phone]  Physical Therapy Evaluation  Patient Details  Name: JOSEPHYNE TARTER MRN: [id number] Date of Birth: 1961-09-12 Referring Provider (PT): Mady Gemma   Encounter Date: 08/29/2021   PT End of Session - 08/29/21 1454     Visit Number 1    Number of Visits 12    Date for PT Re-Evaluation 10/10/21    PT Start Time 1400    PT Stop Time 1454    PT Time Calculation (min) 54 min    Activity Tolerance Patient tolerated treatment well    Behavior During Therapy [facility] for tasks assessed/performed             Past Medical History:  Diagnosis Date   Anxiety    Complication of anesthesia    Coronary artery disease    a. 11/2016 Cath/PCI: LM nl, LAD 30ost, 60m(3.0x18 Resolute Integrity DES--3.75), LCX nl, RCA nl; b. MV 11/18: negative for ischemia, EF 72%, low risk   Depression    Diabetes mellitus without complication ([facility]    type 2   Diastolic dysfunction    a. 11/2016 Echo: EF 55-60%, Gr2 DD, LAE, nl RV size/fxn.   Family history of breast cancer    History of kidney stones    Hyperlipemia \\[id number]\  Hypothyroidism    Kidney stone 08/2016   Mitral valve prolapse    MVP (mitral valve prolapse)    Neuromuscular disorder (HCC)    bilateral feet   PONV (postoperative nausea and vomiting)    STEMI (ST elevation myocardial infarction) ([facility] 12/02/2016   Vitamin B 12 deficiency     Past Surgical History:  Procedure Laterality Date   ABDOMINAL HYSTERECTOMY  2003   TAH due to adenomyosis   BREAST LUMPECTOMY WITH RADIOACTIVE SEED AND SENTINEL LYMPH NODE BIOPSY Right 10/04/2020   Procedure: RIGHT BREAST REDUCTION LUMPECTOMY WITH RADIOACTIVE SEED AND SENTINEL LYMPH NODE BIOPSY;  Surgeon: TJovita Kussmaul MD;  [place] [facility]  Service: General;  Laterality: Right;   BREAST REDUCTION SURGERY  1990   BREAST  REDUCTION SURGERY Bilateral 10/04/2020   Procedure: BILATERAL MAMMARY REDUCTION  (BREAST);  Surgeon: DWallace Going DO;  [place] [facility]  Service: Plastics;  Laterality: Bilateral;   CARDIAC CATHETERIZATION N/A 12/02/2016   Procedure: Coronary Stent Intervention;  Surgeon: CNelva Bush MD;  [facility];  Service: Cardiovascular;  Laterality: N/A;   CARDIAC CATHETERIZATION N/A 12/02/2016   Procedure: Left Heart Cath and Coronary Angiography;  Surgeon: CNelva Bush MD;  [facility];  Service: Cardiovascular;  Laterality: N/A;   CARDIAC CATHETERIZATION N/A 12/02/2016   Procedure: Intravascular Ultrasound/IVUS;  Surgeon: CNelva Bush MD;  [facility];  Service: Cardiovascular;  Laterality: N/A;   CORONARY STENT INTERVENTION     MOUTH SURGERY     Dental implant   PORTACATH PLACEMENT Left 12/13/2020   Procedure: INSERTION PORT-A-CATH LEFT SUBCLAVIAN;  Surgeon: TJovita Kussmaul MD;  [place] [facility]  Service: General;  Laterality: Left;    There were no vitals filed for this visit.    Subjective Assessment - 08/29/21 1412     Subjective Pt. started noticing swelling around September  2022 and her breast felt heavy.  She saw Dr. MJana Hakimand was placed on Keflex for possible mastitis.  Breast swelling did seem to reduce  after keflex.  On Sept 12 she had a SOZO and was 8 points over her baseline so she was put in a sleeve. Pt wore the sleeve most days, but not every day for 1 month. She has been wearing a compression bra and swelling has improved.  She still notices a little bit of swelling laterally in the right breast    Pertinent History Patient was diagnosed on 06/30/2020 with right grade III invasive ductal carcinoma breast cancer. Patient underwent a right lumpectomy and sentinel node biopsy (1/3 nodes positive) on 10/04/2020 with a bilateral reduction. It is ER/PR positive and HER2  negative with a Ki67 of 30%.    Patient Stated Goals check breast swelling    Currently in Pain? No/denies    Pain Score 0-No pain                [facility] PT Assessment - 08/29/21 0001       Assessment   Medical Diagnosis s/p right lumpectomy and sentinel node biopsy with bilateral reduction    Referring Provider (PT) Vertis Kelch Causey    Onset Date/Surgical Date 10/04/20    Hand Dominance Right      Precautions   Precaution Comments recent surgery; right arm lymphedema risk      Restrictions   Weight Bearing Restrictions No      Balance Screen   Has the patient fallen in the past 6 months No    Has the patient had a decrease in activity level because of a fear of falling?  No    Is the patient reluctant to leave their home because of a fear of falling?  No      Home Environment   Living Environment Private residence    Living Arrangements Alone    Available Help at Discharge Family      Prior Function   Level of Independence Independent    Vocation Part time employment    Leisure spends time with grandchildren, some walking but not consistently      Cognition   Overall Cognitive Status Within Functional Limits for tasks assessed      Observation/Other Assessments   Observations incisions healed; fibrosis noted at axillary incision and prox/lateral breast.      Posture/Postural Control   Posture/Postural Control Postural limitations    Postural Limitations Rounded Shoulders;Forward head               LYMPHEDEMA/ONCOLOGY QUESTIONNAIRE - 08/29/21 0001       Type   Cancer Type Right breast Cancer      Surgeries   Lumpectomy Date 10/04/20    Sentinel Lymph Node Biopsy Date 10/04/20    Number Lymph Nodes Removed 3      Treatment   Active Chemotherapy Treatment No    Past Chemotherapy Treatment Yes    Active Radiation Treatment No    Past Radiation Treatment Yes    Current Hormone Treatment Yes    Drug Name Tamoxifen    Past Hormone Therapy No       What other symptoms do you have   Are you Having Heaviness or Tightness Yes    Are you having Pain No    Are you having pitting edema No    Is it Hard or Difficult finding clothes that fit No    Do you have infections Yes    Comments keflex      Right Upper Extremity Lymphedema   10 cm Proximal to Olecranon Process 28.8 cm  Olecranon Process 24.7 cm    10 cm Proximal to Ulnar Styloid Process 21.8 cm    Just Proximal to Ulnar Styloid Process 16 cm    Across Hand at [employer] 19.7 cm    At [place] of 2nd Digit 6.4 cm      Left Upper Extremity Lymphedema   10 cm Proximal to Olecranon Process 28.9 cm    Olecranon Process 25.5 cm    10 cm Proximal to Ulnar Styloid Process 21.1 cm    Just Proximal to Ulnar Styloid Process 15.7 cm    Across Hand at [employer] 19.7 cm    At [place] of 2nd Digit 6.4 cm             L-DEX FLOWSHEETS - 08/29/21 1400       L-DEX LYMPHEDEMA SCREENING   Measurement Type Unilateral    L-DEX MEASUREMENT EXTREMITY Upper Extremity    POSITION  Standing    DOMINANT SIDE Right    At Risk Side Right    BASELINE SCORE (UNILATERAL) -2.2    L-DEX SCORE (UNILATERAL) 1.5    VALUE CHANGE (UNILAT) 3.7                    Objective measurements completed on examination: See above findings.                     PT Long Term Goals - 08/29/21 1503       PT LONG TERM GOAL #1   Title Pt will be independent in self breast MLD    Time 6    Period Weeks    Status New    Target Date 10/10/21      PT LONG TERM GOAL #2   Title Pt will report decreased breast swelling/heaviness by 50% or greater    Time 6    Period Weeks    Status New    Target Date 10/10/21      PT LONG TERM GOAL #3   Title Pt will be evaluated for flexitouch or similar for right breast edema    Time 6    Period Weeks    Status New    Target Date 10/10/21      PT LONG TERM GOAL #4   Title Pt will notice softening of right breast fibrosis after MLD     Time 6    Status New    Target Date 10/10/21      PT LONG TERM GOAL #5   Title Pt will be educated in precautions for decreasing risk of lymphedema    Time 6    Status New    Target Date 10/10/21                    Plan - 08/29/21 1455     Clinical Impression Statement Pt had a repeat SOZO screen today and after wearing her compression sleeve she is back in the green zone again.  She should only wear her sleeve now with exercise or repetetive movements. She was referred for breast lymphedema. There is observable swelling at the lateral breast with fibrosis around the axillary incision and in the proximal lateral breast.  She lives in [place] and asked about having therapy there.  Sent In basket to [facility] to see if this can be arranged for the pt.  pt will benefit from manual lymph drainage to the right breast and instruction in MLD.  Foam  pad or chip pack may also be beneficial.  We discussed flexitouch as a possibility if her insurance covers it well.    Personal Factors and Comorbidities Comorbidity 3+    Comorbidities right breast Cancer s/p radiation and chemo, CAD, prior MI, DM    Stability/Clinical Decision Making Stable/Uncomplicated    Clinical Decision Making Low    Rehab Potential Excellent    PT Frequency 2x / week    PT Duration 6 weeks    PT Treatment/Interventions ADLs/Self Care Home Management;Therapeutic exercise;Patient/family education;Manual lymph drainage;Taping;Vasopneumatic Device;Manual techniques    PT Next Visit Plan Cont every 3 month L-Dex screens for up to 2 years from her SLNB, breast MLD and self instruction, Foam pad/pack to decrease fibrosis, Flexitouch or similar    Recommended Other Services continue SOZO every 3 months for 2 years, ABC class    Consulted and Agree with Plan of Care Patient             Patient will benefit from skilled therapeutic intervention in order to improve the following deficits and impairments:  Postural  dysfunction, Increased edema, Decreased knowledge of precautions  Visit Diagnosis: Aftercare following surgery for neoplasm  Localized edema  Abnormal posture     Problem List Patient Active Problem List   Diagnosis Date Noted   Hyperlipidemia associated with type 2 diabetes mellitus ([place]) 05/05/2021   Genetic testing 01/11/2021   Port-A-Cath in place 12/14/2020   Family history of breast cancer    Malignant neoplasm of upper-outer quadrant of right breast in female, estrogen receptor positive ([place]) 08/13/2020   Shortness of breath 02/19/2020   Essential hypertension 09/09/2018   Influenza B 10/18/2017   Unstable angina ([place]) 09/14/2017   Coronary artery disease involving native coronary artery of native heart without angina pectoris 07/18/2017   Insomnia 08/25/2015   Hot flashes, menopausal 08/25/2015   Well woman exam 07/22/2015   Family history of diabetes mellitus 07/15/2014   Vitamin D deficiency 11/03/2009   Hyperlipidemia LDL goal <70 11/03/2009   ANXIETY 11/03/2009   Depression 11/03/2009    Claris Pong, PT 08/29/2021, 3:07 PM  [facility] @ [place], [place], [address] Phone: [phone]   Fax:  [phone]  Name: JEWELLE WHITNER MRN: [id number] Date of Birth: 13-Apr-1961

## 2021-09-11 ENCOUNTER — Other Ambulatory Visit: Payer: Self-pay | Admitting: Internal Medicine

## 2021-10-08 ENCOUNTER — Other Ambulatory Visit: Payer: Self-pay | Admitting: Oncology

## 2021-10-10 ENCOUNTER — Encounter: Payer: Self-pay | Admitting: Hematology

## 2021-10-11 ENCOUNTER — Other Ambulatory Visit: Payer: Self-pay

## 2021-10-11 ENCOUNTER — Ambulatory Visit: Payer: 59

## 2021-10-11 DIAGNOSIS — R293 Abnormal posture: Secondary | ICD-10-CM | POA: Diagnosis not present

## 2021-10-11 DIAGNOSIS — R600 Localized edema: Secondary | ICD-10-CM

## 2021-10-11 DIAGNOSIS — C50411 Malignant neoplasm of upper-outer quadrant of right female breast: Secondary | ICD-10-CM | POA: Insufficient documentation

## 2021-10-11 DIAGNOSIS — Z483 Aftercare following surgery for neoplasm: Secondary | ICD-10-CM

## 2021-10-11 DIAGNOSIS — M25611 Stiffness of right shoulder, not elsewhere classified: Secondary | ICD-10-CM | POA: Insufficient documentation

## 2021-10-11 DIAGNOSIS — Z170 Estrogen receptor positive status [ER+]: Secondary | ICD-10-CM | POA: Insufficient documentation

## 2021-10-11 NOTE — Therapy (Signed)
[facility] @ [place], [place], [address] Phone: [phone]   Fax:  [phone]  Physical Therapy Treatment  Patient Details  Name: Cassandra Cox MRN: [id number] Date of Birth: 10/11/1961 Referring Provider (PT): Thedore Mins   Encounter Date: 10/11/2021   PT End of Session - 10/11/21 1600     Visit Number 2    Number of Visits 10    Date for PT Re-Evaluation 11/08/21    PT Start Time 1504    PT Stop Time 1553    PT Time Calculation (min) 49 min    Activity Tolerance Patient tolerated treatment well    Behavior During Therapy [facility] for tasks assessed/performed             Past Medical History:  Diagnosis Date   Anxiety    Complication of anesthesia    Coronary artery disease    a. 11/2016 Cath/PCI: LM nl, LAD 30ost, 4m(3.0x18 Resolute Integrity DES--3.75), LCX nl, RCA nl; b. MV 11/18: negative for ischemia, EF 72%, low risk   Depression    [facility]    type 2   Diastolic dysfunction    a. 11/2016 Echo: EF 55-60%, Gr2 DD, LAE, nl RV size/fxn.   Family history of breast cancer    History of kidney stones    Hyperlipemia \\[id number]\  Hypothyroidism    Kidney stone 08/2016   Mitral valve prolapse    MVP (mitral valve prolapse)    Neuromuscular disorder (HCC)    bilateral feet   PONV (postoperative nausea and vomiting)    STEMI (ST elevation myocardial infarction) ([facility] 12/02/2016   Vitamin B 12 deficiency     Past Surgical History:  Procedure Laterality Date   ABDOMINAL HYSTERECTOMY  2003   TAH due to adenomyosis   BREAST LUMPECTOMY WITH RADIOACTIVE SEED AND SENTINEL LYMPH NODE BIOPSY Right 10/04/2020   Procedure: RIGHT BREAST REDUCTION LUMPECTOMY WITH RADIOACTIVE SEED AND SENTINEL LYMPH NODE BIOPSY;  Surgeon: TJovita Kussmaul MD;  [facility] [facility]  Service: General;  Laterality: Right;   BREAST REDUCTION SURGERY  1990   BREAST  REDUCTION SURGERY Bilateral 10/04/2020   Procedure: BILATERAL MAMMARY REDUCTION  (BREAST);  Surgeon: DWallace Going DO;  [place] [facility]  Service: Plastics;  Laterality: Bilateral;   CARDIAC CATHETERIZATION N/A 12/02/2016   Procedure: Coronary Stent Intervention;  Surgeon: CNelva Bush MD;  [facility];  Service: Cardiovascular;  Laterality: N/A;   CARDIAC CATHETERIZATION N/A 12/02/2016   Procedure: Left Heart Cath and Coronary Angiography;  Surgeon: CNelva Bush MD;  [facility];  Service: Cardiovascular;  Laterality: N/A;   CARDIAC CATHETERIZATION N/A 12/02/2016   Procedure: Intravascular Ultrasound/IVUS;  Surgeon: CNelva Bush MD;  [facility];  Service: Cardiovascular;  Laterality: N/A;   CORONARY STENT INTERVENTION     MOUTH SURGERY     Dental implant   PORTACATH PLACEMENT Left 12/13/2020   Procedure: INSERTION PORT-A-CATH LEFT SUBCLAVIAN;  Surgeon: TJovita Kussmaul MD;  [place] [facility]  Service: General;  Laterality: Left;    There were no vitals filed for this visit.   Subjective Assessment - 10/11/21 1504     Subjective [place] [facility] [place] [facility] [place] [facility]  They told me they don't do it for the UE/breast. I have been wearing the compression bra as much as I can.  I get really hot.  Still mainly feeling some swelling at the lateral breast, but nothing in the arm.    Pertinent History Patient was diagnosed on 06/30/2020 with right grade III invasive ductal carcinoma breast cancer. Patient underwent a right lumpectomy and sentinel node biopsy (1/3 nodes positive) on 10/04/2020 with a bilateral reduction. It is ER/PR positive and HER2 negative with a Ki67 of 30%.    Patient Stated Goals check breast swelling    Currently in Pain? No/denies    Pain Score 0-No pain                [facility] PT Assessment - 10/11/21 0001       Assessment   Medical Diagnosis s/p right lumpectomy and  sentinel node biopsy with bilateral reduction    Referring Provider (PT) Thedore Mins    Onset Date/Surgical Date 10/04/20    Hand Dominance Right      Cognition   Overall Cognitive Status Within Functional Limits for tasks assessed      Observation/Other Assessments   Observations long incisions inferior breast from remote reduction surgery, areola incision and axillary incision with fibrosis proximal to areola as well as inferior lateral and lateral breast. Mild redness noted distal to areola but no concerns for infection      Posture/Postural Control   Posture/Postural Control Postural limitations    Postural Limitations Rounded Shoulders;Forward head      AROM   Right Shoulder Extension 68 Degrees    Right Shoulder Flexion 156 Degrees    Right Shoulder ABduction 170 Degrees   pulls in axillary region   Left Shoulder Extension 58 Degrees    Left Shoulder Flexion 158 Degrees    Left Shoulder ABduction 160 Degrees               LYMPHEDEMA/ONCOLOGY QUESTIONNAIRE - 10/11/21 0001       Type   Cancer Type Right breast Cancer      Surgeries   Lumpectomy Date 10/04/20    Sentinel Lymph Node Biopsy Date 10/04/20    Number Lymph Nodes Removed 3      What other symptoms do you have   Are you Having Heaviness or Tightness Yes    Are you having Pain --   sometimes   Are you having pitting edema No    Is it Hard or Difficult finding clothes that fit No    Do you have infections No    Other Symptoms 97.5 cm at axilla and around chest, 103 cm at bust      Lymphedema Assessments   Lymphedema Assessments Upper extremities                        [facility] Adult PT Treatment/Exercise - 10/11/21 0001       Manual Therapy   Manual Lymphatic Drainage (MLD) PT performed MLD to right breast including short neck, 5 diaphragmatic breaths, left axillary and right inguinal LN's, anterior interaxillary pathway, axillo-inguinal pathway, and right breast medial to  interaxillary pathway and lateral breast to right axillo-inguinal pathway retracing all steps and ending with LN's.  Education given to pt. while performing.                          PT Long Term Goals - 08/29/21 1503       PT LONG TERM GOAL #1   Title Pt will be independent in slef breast MLD    Time 6  Period Weeks    Status New    Target Date 10/10/21      PT LONG TERM GOAL #2   Title Pt will report decreased breast swelling/heaviness by 50% or greater    Time 6    Period Weeks    Status New    Target Date 10/10/21      PT LONG TERM GOAL #3   Title Pt will be evaluated for flexitouch or similar for right breast edema    Time 6    Period Weeks    Status New    Target Date 10/10/21      PT LONG TERM GOAL #4   Title Pt will notice softening of right breast fibrosis after MLD    Time 6    Status New    Target Date 10/10/21      PT LONG TERM GOAL #5   Title Pt will be educated in precautions for decreasing risk of lymphedema    Time 6    Status New    Target Date 10/10/21                   Plan - 10/11/21 1600     Clinical Impression Statement Pt was unable to set up appointments at [facility] which is much closer to home so she will come here for treatment of right breast lymphedema. Pt has not been seen since initial evaluation and was reassessed today.  She continues with increased right breast swelling with fibrosis noted. She has long helaed  incisions under both breasts from prior reduction surgery. There is an incision around the areola and near the armpit from her Breast Cancer surgery. There is fibrosis noted especially at lateral lower breast and slightly proximal to areola incision and axillary incision.  After assessment, PT performed right breast MLD with education to pt about lymphatic system and reasons for gentle stretch and sequence.  Chest measurements were taken. Pt would like to have demographics sent for Flexitouch.  (sent today)     Personal Factors and Comorbidities Comorbidity 3+    Comorbidities right breast Cancer s/p radiation and chemo, CAD, prior MI, DM    Stability/Clinical Decision Making Stable/Uncomplicated    Rehab Potential Excellent    PT Frequency 2x / week    PT Duration 4 weeks    PT Treatment/Interventions ADLs/Self Care Home Management;Therapeutic exercise;Patient/family education;Manual lymph drainage;Taping;Vasopneumatic Device;Manual techniques    PT Next Visit Plan Cont every 3 month L-Dex screens for up to 2 years from her SLNB, breast MLD dand self instruction, Foam pad/pack to decrease fibrosis, Flexitouch or similar    Consulted and Agree with Plan of Care Patient             Patient will benefit from skilled therapeutic intervention in order to improve the following deficits and impairments:  Postural dysfunction, Increased edema, Decreased knowledge of precautions  Visit Diagnosis: Aftercare following surgery for neoplasm  Localized edema  Abnormal posture     Problem List Patient Active Problem List   Diagnosis Date Noted   Hyperlipidemia associated with type 2 diabetes mellitus ([place]) 05/05/2021   Genetic testing 01/11/2021   Port-A-Cath in place 12/14/2020   Family history of breast cancer    Malignant neoplasm of upper-outer quadrant of right breast in female, estrogen receptor positive ([place]) 08/13/2020   Shortness of breath 02/19/2020   Essential hypertension 09/09/2018   Influenza B 10/18/2017   Unstable angina ([place]) 09/14/2017   Coronary artery disease involving  native coronary artery of native heart without angina pectoris 07/18/2017   Insomnia 08/25/2015   Hot flashes, menopausal 08/25/2015   Well woman exam 07/22/2015   Family history of diabetes mellitus 07/15/2014   Vitamin D deficiency 11/03/2009   Hyperlipidemia LDL goal <70 11/03/2009   ANXIETY 11/03/2009   Depression 11/03/2009    Claris Pong, PT 10/11/2021, 5:40 PM  [facility] @ [place], [place], [address] Phone: [phone]   Fax:  [phone]  Name: Cassandra Cox MRN: [id number] Date of Birth: Aug 27, 1961

## 2021-10-13 ENCOUNTER — Ambulatory Visit: Payer: 59 | Admitting: Physical Therapy

## 2021-10-13 ENCOUNTER — Encounter: Payer: Self-pay | Admitting: Physical Therapy

## 2021-10-13 ENCOUNTER — Other Ambulatory Visit: Payer: Self-pay

## 2021-10-13 DIAGNOSIS — C50411 Malignant neoplasm of upper-outer quadrant of right female breast: Secondary | ICD-10-CM

## 2021-10-13 DIAGNOSIS — R600 Localized edema: Secondary | ICD-10-CM

## 2021-10-13 DIAGNOSIS — Z483 Aftercare following surgery for neoplasm: Secondary | ICD-10-CM

## 2021-10-13 DIAGNOSIS — R293 Abnormal posture: Secondary | ICD-10-CM

## 2021-10-13 DIAGNOSIS — Z170 Estrogen receptor positive status [ER+]: Secondary | ICD-10-CM

## 2021-10-13 NOTE — Therapy (Signed)
[facility] @ [address] [place], [place], [address] Phone: [phone]   Fax:  [phone]  Physical Therapy Treatment  Patient Details  Name: Cassandra Cox MRN: [id number] Date of Birth: 1961/01/09 Referring Provider (PT): Thedore Mins   Encounter Date: 10/13/2021   PT End of Session - 10/13/21 1500     Visit Number 3    Number of Visits 10    Date for PT Re-Evaluation 11/08/21    PT Start Time 1402    PT Stop Time 1500    PT Time Calculation (min) 58 min    Activity Tolerance Patient tolerated treatment well    Behavior During Therapy [facility] for tasks assessed/performed             Past Medical History:  Diagnosis Date   Anxiety    Complication of anesthesia    Coronary artery disease    a. 11/2016 Cath/PCI: LM nl, LAD 30ost, 30m(3.0x18 Resolute Integrity DES--3.75), LCX nl, RCA nl; b. MV 11/18: negative for ischemia, EF 72%, low risk   Depression    Diabetes mellitus without complication ([facility]    type 2   Diastolic dysfunction    a. 11/2016 Echo: EF 55-60%, Gr2 DD, LAE, nl RV size/fxn.   Family history of breast cancer    History of kidney stones    Hyperlipemia \\[id number]\  Hypothyroidism    Kidney stone 08/2016   Mitral valve prolapse    MVP (mitral valve prolapse)    Neuromuscular disorder (HCC)    bilateral feet   PONV (postoperative nausea and vomiting)    STEMI (ST elevation myocardial infarction) ([facility] 12/02/2016   Vitamin B 12 deficiency     Past Surgical History:  Procedure Laterality Date   ABDOMINAL HYSTERECTOMY  2003   TAH due to adenomyosis   BREAST LUMPECTOMY WITH RADIOACTIVE SEED AND SENTINEL LYMPH NODE BIOPSY Right 10/04/2020   Procedure: RIGHT BREAST REDUCTION LUMPECTOMY WITH RADIOACTIVE SEED AND SENTINEL LYMPH NODE BIOPSY;  Surgeon: TJovita Kussmaul MD;  [place] [facility]  Service: General;  Laterality: Right;   BREAST REDUCTION SURGERY  1990   BREAST  REDUCTION SURGERY Bilateral 10/04/2020   Procedure: BILATERAL MAMMARY REDUCTION  (BREAST);  Surgeon: DWallace Going DO;  [place] [facility]  Service: Plastics;  Laterality: Bilateral;   CARDIAC CATHETERIZATION N/A 12/02/2016   Procedure: Coronary Stent Intervention;  Surgeon: CNelva Bush MD;  [facility];  Service: Cardiovascular;  Laterality: N/A;   CARDIAC CATHETERIZATION N/A 12/02/2016   Procedure: Left Heart Cath and Coronary Angiography;  Surgeon: CNelva Bush MD;  [facility];  Service: Cardiovascular;  Laterality: N/A;   CARDIAC CATHETERIZATION N/A 12/02/2016   Procedure: Intravascular Ultrasound/IVUS;  Surgeon: CNelva Bush MD;  [facility];  Service: Cardiovascular;  Laterality: N/A;   CORONARY STENT INTERVENTION     MOUTH SURGERY     Dental implant   PORTACATH PLACEMENT Left 12/13/2020   Procedure: INSERTION PORT-A-CATH LEFT SUBCLAVIAN;  Surgeon: TJovita Kussmaul MD;  [facility]  Service: General;  Laterality: Left;    There were no vitals filed for this visit.   Subjective Assessment - 10/13/21 1402     Subjective The breast was just a little tender after last session.    Pertinent History Patient was diagnosed on 06/30/2020 with right grade III invasive ductal carcinoma breast cancer. Patient underwent a right lumpectomy and  sentinel node biopsy (1/3 nodes positive) on 10/04/2020 with a bilateral reduction. It is ER/PR positive and HER2 negative with a Ki67 of 30%.    Patient Stated Goals check breast swelling    Currently in Pain? No/denies    Pain Score 0-No pain                               [facility] Adult PT Treatment/Exercise - 10/13/21 0001       Manual Therapy   Manual therapy comments created foam chip pack for pt to wear in her compression bra on inferior breast    Manual Lymphatic Drainage (MLD) in supine: short neck, 5 diaphragmatic  breaths, left axillary and right inguinal LN's, anteiror interaxillary pathway, axillo-inguinal pathway, and right breast medial to interaxillary pathway and lateral breast to right axillo-inguinal pathway retracing all steps and ending with LN's. Had pt return demonstrate entire sequence and provided appropriate verbal and tactile cues for correct skin stretch and speed. Pt tends to be heavy handed and move to quickly through self MLD. PT completed session.                          PT Long Term Goals - 08/29/21 1503       PT LONG TERM GOAL #1   Title Pt will be independent in slef breast MLD    Time 6    Period Weeks    Status New    Target Date 10/10/21      PT LONG TERM GOAL #2   Title Pt will report decreased breast swelling/heaviness by 50% or greater    Time 6    Period Weeks    Status New    Target Date 10/10/21      PT LONG TERM GOAL #3   Title Pt will be evaluated for flexitouch or similar for right breast edema    Time 6    Period Weeks    Status New    Target Date 10/10/21      PT LONG TERM GOAL #4   Title Pt will notice softening of right breast fibrosis after MLD    Time 6    Status New    Target Date 10/10/21      PT LONG TERM GOAL #5   Title Pt will be educated in precautions for decreasing risk of lymphedema    Time 6    Status New    Target Date 10/10/21                   Plan - 10/13/21 1501     Clinical Impression Statement Instructed pt in self MLD today and issued handout. Provided verbal and tactile cues for pt to perform massage correct and had pt return demonstrate entire sequence then therapist completed treatment. Pt had increased fibrosis in inferior breast so created a foam chip pack to help soften this area. Will assess independence with self MLD at next session.    PT Frequency 2x / week    PT Duration 4 weeks    PT Treatment/Interventions ADLs/Self Care Home Management;Therapeutic exercise;Patient/family  education;Manual lymph drainage;Taping;Vasopneumatic Device;Manual techniques    PT Next Visit Plan Cont every 3 month L-Dex screens for up to 2 years from her SLNB, assess indep with self MLD, how did chip pack work?, does pt also need 1/2 in grey foam as an option?, does pt  have appt for flexi trial (it was covered at 100 percent)    Consulted and Agree with Plan of Care Patient             Patient will benefit from skilled therapeutic intervention in order to improve the following deficits and impairments:  Postural dysfunction, Increased edema, Decreased knowledge of precautions  Visit Diagnosis: Aftercare following surgery for neoplasm  Localized edema  Abnormal posture  Malignant neoplasm of upper-outer quadrant of right breast in female, estrogen receptor positive ([place])     Problem List Patient Active Problem List   Diagnosis Date Noted   Hyperlipidemia associated with type 2 diabetes mellitus ([place]) 05/05/2021   Genetic testing 01/11/2021   Port-A-Cath in place 12/14/2020   Family history of breast cancer    Malignant neoplasm of upper-outer quadrant of right breast in female, estrogen receptor positive ([place]) 08/13/2020   Shortness of breath 02/19/2020   Essential hypertension 09/09/2018   Influenza B 10/18/2017   Unstable angina ([place]) 09/14/2017   Coronary artery disease involving native coronary artery of native heart without angina pectoris 07/18/2017   Insomnia 08/25/2015   Hot flashes, menopausal 08/25/2015   Well woman exam 07/22/2015   Family history of diabetes mellitus 07/15/2014   Vitamin D deficiency 11/03/2009   Hyperlipidemia LDL goal <70 11/03/2009   ANXIETY 11/03/2009   Depression 11/03/2009    Allyson Sabal Simpson, PT 10/13/2021, 3:05 PM  [facility] @ [address] [place], [place], [address] Phone: [phone]   Fax:  [phone]  Name: Cassandra Cox MRN: [id number] Date of Birth:  Jan 30, 1961   Manus Gunning, PT 10/13/21 3:05 PM

## 2021-10-13 NOTE — Patient Instructions (Signed)
Self manual lymph drainage: Perform this sequence once a day.  Only give enough pressure no your skin to make the skin move.  Diaphragmatic - Supine   Inhale through nose making navel move out toward hands. Exhale through puckered lips, hands follow navel in. Repeat _5__ times. Rest _10__ seconds between repeats.   Copyright  VHI. All rights reserved.  Hug yourself.  Do circles at your neck just above your collarbones.  Repeat this 10 times.  Axilla - One at a Time   Using full weight of flat hand and fingers at center of uninvolved armpit, make _10__ in-place circles.   Copyright  VHI. All rights reserved.  LEG: Inguinal Nodes Stimulation   With small finger side of hand against hip crease on involved side, gently perform circles at the crease. Repeat __10_ times.   Copyright  VHI. All rights reserved.  Axilla to Inguinal Nodes - Sweep   On involved side, stretch skin_4__ times from armpit along side of trunk to hip crease.  Now gently stretch skin from the involved side to the uninvolved side across the chest at the shoulder line.  Repeat that 4 times.  Draw an imaginary diagonal line from upper outer breast through the nipple area toward lower inner breast.  Direct fluid upward and inward from this line toward the pathway across your upper chest .  Do this in three rows to treat all of the upper inner breast tissue, and do each row 3-4x.      Direct fluid to treat all of lower outer breast tissue downward and outward toward pathway that is aimed at the left groin.  Finish by doing the pathways as described above going from your involved armpit to the same side groin and going across your upper chest from the involved shoulder to the uninvolved shoulder.  Repeat the steps above where you do circles in your right groin and left armpit. Copyright  VHI. All rights reserved.

## 2021-10-13 NOTE — Progress Notes (Signed)
Received message from [place], at Dr. Ethlyn Gallery office that patient does not need to stop Aspirin prior to sx.

## 2021-10-14 ENCOUNTER — Other Ambulatory Visit: Payer: Self-pay

## 2021-10-14 ENCOUNTER — Encounter (HOSPITAL_BASED_OUTPATIENT_CLINIC_OR_DEPARTMENT_OTHER): Payer: Self-pay | Admitting: General Surgery

## 2021-10-14 DIAGNOSIS — M25611 Stiffness of right shoulder, not elsewhere classified: Secondary | ICD-10-CM | POA: Insufficient documentation

## 2021-10-14 DIAGNOSIS — C50411 Malignant neoplasm of upper-outer quadrant of right female breast: Secondary | ICD-10-CM | POA: Insufficient documentation

## 2021-10-14 DIAGNOSIS — R600 Localized edema: Secondary | ICD-10-CM | POA: Insufficient documentation

## 2021-10-14 DIAGNOSIS — Z483 Aftercare following surgery for neoplasm: Secondary | ICD-10-CM | POA: Insufficient documentation

## 2021-10-14 DIAGNOSIS — Z170 Estrogen receptor positive status [ER+]: Secondary | ICD-10-CM | POA: Insufficient documentation

## 2021-10-14 DIAGNOSIS — R293 Abnormal posture: Secondary | ICD-10-CM | POA: Insufficient documentation

## 2021-10-18 ENCOUNTER — Inpatient Hospital Stay: Payer: 59 | Attending: Genetic Counselor | Admitting: Adult Health

## 2021-10-18 ENCOUNTER — Ambulatory Visit: Payer: 59

## 2021-10-18 ENCOUNTER — Encounter (HOSPITAL_BASED_OUTPATIENT_CLINIC_OR_DEPARTMENT_OTHER)
Admission: RE | Admit: 2021-10-18 | Discharge: 2021-10-18 | Disposition: A | Discharge status: Home / Self Care | Payer: 59 | Source: Ambulatory Visit | Attending: General Surgery | Admitting: General Surgery

## 2021-10-18 ENCOUNTER — Encounter: Payer: Self-pay | Admitting: Adult Health

## 2021-10-18 ENCOUNTER — Other Ambulatory Visit: Payer: Self-pay

## 2021-10-18 ENCOUNTER — Ambulatory Visit: Payer: Self-pay | Admitting: General Surgery

## 2021-10-18 VITALS — BP 146/78 | HR 96 | Temp 97.5°F | Resp 18 | Ht 62.5 in | Wt 174.4 lb

## 2021-10-18 DIAGNOSIS — I2510 Atherosclerotic heart disease of native coronary artery without angina pectoris: Secondary | ICD-10-CM | POA: Diagnosis not present

## 2021-10-18 DIAGNOSIS — Z483 Aftercare following surgery for neoplasm: Secondary | ICD-10-CM

## 2021-10-18 DIAGNOSIS — C50111 Malignant neoplasm of central portion of right female breast: Secondary | ICD-10-CM | POA: Diagnosis not present

## 2021-10-18 DIAGNOSIS — Z9221 Personal history of antineoplastic chemotherapy: Secondary | ICD-10-CM | POA: Diagnosis not present

## 2021-10-18 DIAGNOSIS — F419 Anxiety disorder, unspecified: Secondary | ICD-10-CM | POA: Diagnosis not present

## 2021-10-18 DIAGNOSIS — R600 Localized edema: Secondary | ICD-10-CM

## 2021-10-18 DIAGNOSIS — R293 Abnormal posture: Secondary | ICD-10-CM

## 2021-10-18 DIAGNOSIS — C773 Secondary and unspecified malignant neoplasm of axilla and upper limb lymph nodes: Secondary | ICD-10-CM | POA: Diagnosis not present

## 2021-10-18 DIAGNOSIS — Z170 Estrogen receptor positive status [ER+]: Secondary | ICD-10-CM

## 2021-10-18 DIAGNOSIS — C50411 Malignant neoplasm of upper-outer quadrant of right female breast: Secondary | ICD-10-CM | POA: Diagnosis not present

## 2021-10-18 DIAGNOSIS — Z955 Presence of coronary angioplasty implant and graft: Secondary | ICD-10-CM | POA: Diagnosis not present

## 2021-10-18 DIAGNOSIS — I252 Old myocardial infarction: Secondary | ICD-10-CM | POA: Diagnosis not present

## 2021-10-18 DIAGNOSIS — Z452 Encounter for adjustment and management of vascular access device: Secondary | ICD-10-CM | POA: Diagnosis not present

## 2021-10-18 DIAGNOSIS — F32A Depression, unspecified: Secondary | ICD-10-CM | POA: Diagnosis not present

## 2021-10-18 LAB — BASIC METABOLIC PANEL
Anion gap: 9 (ref 5–15)
BUN: 16 mg/dL (ref 8–23)
CO2: 24 mmol/L (ref 22–32)
Calcium: 9.4 mg/dL (ref 8.9–10.3)
Chloride: 106 mmol/L (ref 98–111)
Creatinine, Ser: 0.56 mg/dL (ref 0.44–1.00)
GFR, Estimated: >60 mL/min (ref >60)
Glucose, Bld: 103 mg/dL — ABNORMAL HIGH (ref 70–99)
Potassium: 4.4 mmol/L (ref 3.5–5.1)
Sodium: 139 mmol/L (ref 135–145)

## 2021-10-18 NOTE — Progress Notes (Signed)

## 2021-10-18 NOTE — Therapy (Signed)
[facility] @ [address] [place], [place], [address] Phone: [phone]   Fax:  [phone]  Physical Therapy Treatment  Patient Details  Name: Cassandra Cox MRN: [id number] Date of Birth: 1961/05/12 Referring Provider (PT): Thedore Mins   Encounter Date: 10/18/2021   PT End of Session - 10/18/21 1532     Visit Number 4    Number of Visits 10    Date for PT Re-Evaluation 11/08/21    PT Start Time 1527    PT Stop Time 1559    PT Time Calculation (min) 32 min    Activity Tolerance Patient tolerated treatment well    Behavior During Therapy [facility] for tasks assessed/performed             Past Medical History:  Diagnosis Date   Anxiety    Complication of anesthesia    Coronary artery disease    a. 11/2016 Cath/PCI: LM nl, LAD 30ost, 19m(3.0x18 Resolute Integrity DES--3.75), LCX nl, RCA nl; b. MV 11/18: negative for ischemia, EF 72%, low risk   Depression    Diabetes mellitus without complication ([facility]    type 2   Diastolic dysfunction    a. 11/2016 Echo: EF 55-60%, Gr2 DD, LAE, nl RV size/fxn.   Family history of breast cancer    History of kidney stones    Hyperlipemia \\[id number]\  Hypothyroidism    Kidney stone 08/2016   Mitral valve prolapse    MVP (mitral valve prolapse)    Neuromuscular disorder (HCC)    bilateral feet   PONV (postoperative nausea and vomiting)    [facility] elevation [facility] [place] 12/02/2016   Vitamin B 12 deficiency     Past Surgical History:  Procedure Laterality Date   ABDOMINAL HYSTERECTOMY  2003   TAH due to adenomyosis   BREAST LUMPECTOMY WITH RADIOACTIVE SEED AND SENTINEL LYMPH NODE BIOPSY Right 10/04/2020   Procedure: RIGHT BREAST REDUCTION LUMPECTOMY WITH RADIOACTIVE SEED AND SENTINEL LYMPH NODE BIOPSY;  Surgeon: TJovita Kussmaul MD;  [facility]  Service: General;  Laterality: Right;   BREAST REDUCTION SURGERY  1990   BREAST  REDUCTION SURGERY Bilateral 10/04/2020   Procedure: BILATERAL MAMMARY REDUCTION  (BREAST);  Surgeon: DWallace Going DO;  [facility]  Service: Plastics;  Laterality: Bilateral;   CARDIAC CATHETERIZATION N/A 12/02/2016   Procedure: Coronary Stent Intervention;  Surgeon: CNelva Bush MD;  [facility];  Service: Cardiovascular;  Laterality: N/A;   CARDIAC CATHETERIZATION N/A 12/02/2016   Procedure: Left Heart Cath and Coronary Angiography;  Surgeon: CNelva Bush MD;  [facility];  Service: Cardiovascular;  Laterality: N/A;   CARDIAC CATHETERIZATION N/A 12/02/2016   Procedure: Intravascular Ultrasound/IVUS;  Surgeon: CNelva Bush MD;  [facility];  Service: Cardiovascular;  Laterality: N/A;   CORONARY STENT INTERVENTION     MOUTH SURGERY     Dental implant   PORTACATH PLACEMENT Left 12/13/2020   Procedure: INSERTION PORT-A-CATH LEFT SUBCLAVIAN;  Surgeon: TJovita Kussmaul MD;  [place] [facility]  Service: General;  Laterality: Left;    There were no vitals filed for this visit.   Subjective Assessment - 10/18/21 1531     Subjective Pt 25 min late.  Has not heard from [employer] yet about a trial, but knows it is 100% covered.  Breast swelling seems to be a little better.  I have been trying  to do the MLD. (Therapist emailed Megan/Derek about a trial today)   Pertinent History Patient was diagnosed on 06/30/2020 with right grade III invasive ductal carcinoma breast cancer. Patient underwent a right lumpectomy and sentinel node biopsy (1/3 nodes positive) on 10/04/2020 with a bilateral reduction. It is ER/PR positive and HER2 negative with a Ki67 of 30%.    Currently in Pain? No/denies    Pain Score 0-No pain                               [facility] Adult PT Treatment/Exercise - 10/18/21 0001       Manual Therapy   Manual therapy comments compliant with foam and  breast is slightly softer    Manual Lymphatic Drainage (MLD) in supine: short neck, 5 diaphragmatic breaths, left axillary and right inguinal LN's, anterior interaxillary pathway, axillo-inguinal pathway, and right breast medial to interaxillary pathway and lateral breast to right axillo-inguinal pathway retracing all steps and ending with LN's. Had pt return demonstrate pathways and breast sequence and provided appropriate verbal and tactile cues for correct skin stretch and speed.   PT completed session.                          PT Long Term Goals - 08/29/21 1503       PT LONG TERM GOAL #1   Title Pt will be independent in slef breast MLD    Time 6    Period Weeks    Status New    Target Date 10/10/21      PT LONG TERM GOAL #2   Title Pt will report decreased breast swelling/heaviness by 50% or greater    Time 6    Period Weeks    Status New    Target Date 10/10/21      PT LONG TERM GOAL #3   Title Pt will be evaluated for flexitouch or similar for right breast edema    Time 6    Period Weeks    Status New    Target Date 10/10/21      PT LONG TERM GOAL #4   Title Pt will notice softening of right breast fibrosis after MLD    Time 6    Status New    Target Date 10/10/21      PT LONG TERM GOAL #5   Title Pt will be educated in precautions for decreasing risk of lymphedema    Time 6    Status New    Target Date 10/10/21                   Plan - 10/18/21 1600     Clinical Impression Statement Pt was 25 min late secondary to another appt at [facility].  Therapist initiated MLD and then had pt peform pathways and breast, and therapist completed MLD.  Pt required occasional VC's for lightening pressure and slowing down a bit.  Technique  and sequence was improved but pt second guesses herself.    Personal Factors and Comorbidities Comorbidity 3+    Comorbidities right breast Cancer s/p radiation and chemo, CAD, prior MI, DM    Stability/Clinical  Decision Making Stable/Uncomplicated    Rehab Potential Excellent    PT Frequency 2x / week    PT Duration 4 weeks    PT Treatment/Interventions ADLs/Self Care Home Management;Therapeutic exercise;Patient/family education;Manual lymph drainage;Taping;Vasopneumatic Device;Manual techniques  PT Next Visit Plan Cont every 3 month L-Dex screens for up to 2 years from her SLNB, assess indep with self MLD, how did chip pack work?, does pt also need 1/2 in grey foam as an option. emailed [employer] about trial 12/13, continue MLD/self MLD    Consulted and Agree with Plan of Care Patient             Patient will benefit from skilled therapeutic intervention in order to improve the following deficits and impairments:  Postural dysfunction, Increased edema, Decreased knowledge of precautions  Visit Diagnosis: Aftercare following surgery for neoplasm  Localized edema  Abnormal posture     Problem List Patient Active Problem List   Diagnosis Date Noted   Hyperlipidemia associated with type 2 diabetes mellitus ([place]) 05/05/2021   Genetic testing 01/11/2021   Port-A-Cath in place 12/14/2020   Family history of breast cancer    Malignant neoplasm of upper-outer quadrant of right breast in female, estrogen receptor positive ([place]) 08/13/2020   Shortness of breath 02/19/2020   Essential hypertension 09/09/2018   Influenza B 10/18/2017   Unstable angina ([place]) 09/14/2017   Coronary artery disease involving native coronary artery of native heart without angina pectoris 07/18/2017   Insomnia 08/25/2015   Hot flashes, menopausal 08/25/2015   Well woman exam 07/22/2015   Family history of diabetes mellitus 07/15/2014   Vitamin D deficiency 11/03/2009   Hyperlipidemia LDL goal <70 11/03/2009   ANXIETY 11/03/2009   Depression 11/03/2009    Claris Pong, PT 10/18/2021, 5:22 PM  [facility] @ [address] [place], [place],  [address] Phone: [phone]   Fax:  [phone]  Name: ANEITA KIGER MRN: [id number] Date of Birth: [birth year]

## 2021-10-18 NOTE — Progress Notes (Signed)
SURVIVORSHIP VISIT:   BRIEF ONCOLOGIC HISTORY:  Cassandra Cox woman status post right lumpectomy and sentinel lymph node sampling 10/04/2020 for a pT1c pN1, stage IB   invasive ductal carcinoma, grade 2, estrogen and progesterone receptor positive, HER2 not amplified, with an MIB-1 of 30%             (a) a total of 3 right axillary lymph nodes removed, one positive with concern regarding extracapsular extension   (1) MammaPrint high Cox predicts a significant benefit from chemotherapy   (2) adjuvant chemotherapy will consist of cyclophosphamide and docetaxel every 21 days x 4 starting 12/14/2020, completed 02/15/2021             (a) Cox used DigniCap successfully for hair preservation   (3) adjuvant radiation to be completed 05/04/2021   (4) tamoxifen to start 06/06/2021             (A) bone density             (B) s/p hysterectomy             (C) s/p OCP and HR for many years w/o complications  INTERVAL HISTORY:  Cassandra Cox to review her survivorship care plan detailing her treatment course for breast cancer, as well as monitoring long-term side effects of that treatment, education regarding health maintenance, screening, and overall wellness and health promotion.     Overall, Cassandra Cox reports feeling quite well.  She continues on tamoxifen and her only side effect is hot flashes.  She does fairing herself frequently and would love something that could help these improve.  Otherwise she continues to see physical therapy and her breast lymphedema is improving.  REVIEW OF SYSTEMS:  Review of Systems  Constitutional:  Negative for appetite change, chills, fatigue, fever and unexpected weight change.  HENT:   Negative for hearing loss, lump/mass and trouble swallowing.   Eyes:  Negative for eye problems and icterus.  Respiratory:  Negative for chest tightness, cough and shortness of breath.   Cardiovascular:  Negative for chest pain, leg swelling and palpitations.  Gastrointestinal:   Negative for abdominal distention, abdominal pain, constipation, diarrhea, nausea and vomiting.  Endocrine: Positive for hot flashes.  Genitourinary:  Negative for difficulty urinating.   Musculoskeletal:  Negative for arthralgias.  Skin:  Negative for itching and rash.  Neurological:  Negative for dizziness, extremity weakness, headaches and numbness.  Hematological:  Negative for adenopathy. Does not bruise/bleed easily.  Psychiatric/Behavioral:  Negative for depression. Cassandra Cox is not nervous/anxious.   Breast: Denies any new nodularity, masses, tenderness, nipple changes, or nipple discharge.      ONCOLOGY TREATMENT TEAM:  1. Surgeon:  Dr. Marlou Starks at [facility] 2. Medical Oncologist: Dr. Lisbeth Renshaw  3. Radiation Oncologist: Dr. Jana Hakim    PAST MEDICAL/SURGICAL HISTORY:  Past Medical History:  Diagnosis Date   Anxiety    Complication of anesthesia    Coronary artery disease    a. 11/2016 Cath/PCI: LM nl, LAD 30ost, 64m(3.0x18 Resolute Integrity DES--3.75), LCX nl, RCA nl; b. MV 11/18: negative for ischemia, EF 72%, low Cox   Depression    Di[place]    type 2   Diastolic dysfunction    a. 11/2016 Echo: EF 55-60%, Gr2 DD, LAE, nl RV size/fxn.   Family history of breast cancer    History of kidney stones    Hyperlipemia \\[id number]\  Hypothyroidism    Kidney stone 08/2016   Mitral valve prolapse  MVP (mitral valve prolapse)    Neuromuscular disorder (HCC)    bilateral feet   PONV (postoperative nausea and vomiting)    STEMI (ST elevation myocardial infarction) ([place]) 12/02/2016   Vitamin B 12 deficiency    Past Surgical History:  Procedure Laterality Date   ABDOMINAL HYSTERECTOMY  2003   TAH due to adenomyosis   BREAST LUMPECTOMY WITH RADIOACTIVE SEED AND SENTINEL LYMPH NODE BIOPSY Right 10/04/2020   Procedure: RIGHT BREAST REDUCTION LUMPECTOMY WITH RADIOACTIVE SEED AND SENTINEL LYMPH NODE BIOPSY;  Surgeon: Jovita Kussmaul,  MD;  Location: [place];  Service: General;  Laterality: Right;   BREAST REDUCTION SURGERY  1990   BREAST REDUCTION SURGERY Bilateral 10/04/2020   Procedure: BILATERAL MAMMARY REDUCTION  (BREAST);  Surgeon: Wallace Going, DO;  Location: [place];  Service: Plastics;  Laterality: Bilateral;   CARDIAC CATHETERIZATION N/A 12/02/2016   Procedure: Coronary Stent Intervention;  Surgeon: Nelva Bush, MD;  Location: [place] [facility];  Service: Cardiovascular;  Laterality: N/A;   CARDIAC CATHETERIZATION N/A 12/02/2016   Procedure: Left Heart Cath and Coronary Angiography;  Surgeon: Nelva Bush, MD;  Location: [place] [facility];  Service: Cardiovascular;  Laterality: N/A;   CARDIAC CATHETERIZATION N/A 12/02/2016   Procedure: Intravascular Ultrasound/IVUS;  Surgeon: Nelva Bush, MD;  Location: [place] [facility];  Service: Cardiovascular;  Laterality: N/A;   CORONARY STENT INTERVENTION     MOUTH SURGERY     Dental implant   PORTACATH PLACEMENT Left 12/13/2020   Procedure: INSERTION PORT-A-CATH LEFT SUBCLAVIAN;  Surgeon: Jovita Kussmaul, MD;  Location: [place];  Service: General;  Laterality: Left;     ALLERGIES:  Allergies  Allergen Reactions   Prednisone Other (See Comments)    REACTION: Swelling REACTION: Swelling Facial swelling REACTION: Swelling   Sulfa Antibiotics Hives and Other (See Comments)    REACTION: Hives   Sulfasalazine     REACTION: Hives   Sulfonamide Derivatives     REACTION: Hives     CURRENT MEDICATIONS:  Outpatient Encounter Medications as of 10/18/2021  Medication Sig Note   ALPRAZolam (XANAX) 0.25 MG tablet Take 0.25 mg by mouth 3 (three) times daily as needed for anxiety.    ARMOUR THYROID 60 MG tablet Take 60 mg by mouth daily.    aspirin EC 81 MG tablet Take 81 mg by mouth daily.    atorvastatin (LIPITOR) 80 MG tablet TAKE 1 TABLET BY MOUTH DAILY AT 6PM GENERIC EQUIVALENT FOR LIPITOR     buPROPion (WELLBUTRIN XL) 150 MG 24 hr tablet Take 300 mg by mouth daily.     Cholecalciferol (VITAMIN D-3) 5000 UNITS TABS Take 1 tablet by mouth daily.    ezetimibe (ZETIA) 10 MG tablet Take 1 tablet (10 mg total) by mouth daily. PLEASE SCHEDULE OFFICE VISIT FOR FURTHER REFILLS. THANK YOU!    FLUoxetine (PROZAC) 20 MG capsule Take 20 mg by mouth daily.     gabapentin (NEURONTIN) 300 MG capsule Take 1 capsule (300 mg total) by mouth at bedtime.    levocetirizine (XYZAL) 5 MG tablet Take 1 tablet at bedtime by mouth.    liothyronine (CYTOMEL) 5 MCG tablet Take 5 mcg by mouth every morning.  07/18/2021: Pt is taking 10 mg   magnesium gluconate (MAGONATE) 500 MG tablet Take 1,000 mg by mouth at bedtime.    metFORMIN (GLUCOPHAGE-XR) 500 MG 24 hr tablet Take 500 mg by mouth 2 (two) times daily.    montelukast (SINGULAIR) 10  MG tablet Take 10 mg by mouth every morning.    tamoxifen (NOLVADEX) 20 MG tablet Take 20 mg by mouth daily.    valACYclovir (VALTREX) 500 MG tablet TAKE 1 TABLET (500 MG TOTAL) BY MOUTH DAILY.    vitamin C (ASCORBIC ACID) 500 MG tablet Take 500 mg by mouth daily.    zinc gluconate 50 MG tablet Take 50 mg by mouth daily.    zolpidem (AMBIEN CR) 12.5 MG CR tablet Take 12.5 mg by mouth at bedtime as needed for sleep.    nitroGLYCERIN (NITROSTAT) 0.4 MG SL tablet Place 1 tablet (0.4 mg total) under Cassandra tongue every 5 (five) minutes as needed for chest pain. For maximum of 3 doses. (Cox not taking: Reported on 10/18/2021)    [DISCONTINUED] prochlorperazine (COMPAZINE) 10 MG tablet Take 1 tablet (10 mg total) by mouth every 6 (six) hours as needed (Nausea or vomiting). 12/02/2020: NEW MED    No facility-administered encounter medications on file as of 10/18/2021.     ONCOLOGIC FAMILY HISTORY:  Family History  Problem Relation Age of Onset   Heart disease Mother    Diabetes Mother    Stroke Mother    Heart attack Mother    Atrial fibrillation Mother    Hypertension Mother     Hyperlipidemia Mother    COPD Father    Hypertension Father    Hyperlipidemia Father    Breast cancer Maternal Aunt 64       x2 age 29   Diabetes Maternal Grandfather      GENETIC COUNSELING/TESTING: Negative  SOCIAL HISTORY:  Social History   Socioeconomic History   Marital status: Divorced    Spouse name: Not on file   Number of children: Not on file   Years of education: Not on file   Highest education level: Not on file  Occupational History    Employer: [employer]  Tobacco Use   Smoking status: Never   Smokeless tobacco: Never  Vaping Use   Vaping Use: Never used  Substance and Sexual Activity   Alcohol use: No   Drug use: No   Sexual activity: Yes    Birth control/protection: Surgical  Other Topics Concern   Not on file  Social History Narrative   Not on file   Social Determinants of Health   Financial Resource Strain: Not on file  Food Insecurity: Not on file  Transportation Needs: Not on file  Physical Activity: Not on file  Stress: Not on file  Social Connections: Not on file  Intimate Partner Violence: Not on file     OBSERVATIONS/OBJECTIVE:  BP (!) 146/78 (BP Location: Left Arm, Cox Position: Sitting)    Pulse 96    Temp (!) 97.5 F (36.4 C) (Tympanic)    Resp 18    Ht 5' 2.5" (1.588 m)    Wt 174 lb 6.4 oz (79.1 kg)    SpO2 99%    BMI 31.39 kg/m  GENERAL: Cox is a well appearing female in no acute distress HEENT:  Sclerae anicteric.  Oropharynx clear and moist. No ulcerations or evidence of oropharyngeal candidiasis. Neck is supple.  NODES:  No cervical, supraclavicular, or axillary lymphadenopathy palpated.  BREAST EXAM: Right breast status postlumpectomy and radiation no sign of local recurrence left breast is benign. LUNGS:  Clear to auscultation bilaterally.  No wheezes or rhonchi. HEART:  Regular rate and rhythm. No murmur appreciated. ABDOMEN:  Soft, nontender.  Positive, normoactive bowel sounds. No organomegaly  palpated.  MSK:  No focal spinal tenderness to palpation. Full range of motion bilaterally in Cassandra upper extremities. EXTREMITIES:  No peripheral edema.   SKIN:  Clear with no obvious rashes or skin changes. No nail dyscrasia. NEURO:  Nonfocal. Well oriented.  Appropriate affect.   LABORATORY DATA:  None for this visit.  DIAGNOSTIC IMAGING:  None for this visit.      ASSESSMENT AND PLAN:  Ms.. Cox is a pleasant 60 y.o. female with Stage 1A estrogen and progesterone positive breast cancer status post lumpectomy, adjuvant chemotherapy, adjuvant radiation therapy, and began antiestrogen treatment with tamoxifen beginning August 2022.  [facility] for our initial meeting and routine follow-up post-completion of treatment for breast cancer.    1. Stage Ia right breast cancer:  Cassandra Cox is continuing to recover from definitive treatment for breast cancer. She will follow-up with her medical oncologist, Dr. Lindi Adie in 50-monthwith history and physical exam per surveillance protocol.  She will continue her anti-estrogen therapy with tamoxifen. Thus far, she is tolerating Cassandra tamoxifen well, with minimal side effects. She was instructed to make Dr. GLindi Adieor myself aware if she begins to experience any worsening side effects of Cassandra medication and I could see her back in clinic to help manage those side effects, as needed. Her mammogram is due September 2023; orders placed today.    Today, a comprehensive survivorship care plan and treatment summary was reviewed with Cassandra Cox today detailing her breast cancer diagnosis, treatment course, potential late/long-term effects of treatment, appropriate follow-up care with recommendations for Cassandra future, and Cox education resources.  A copy of this summary, along with a letter will be sent to Cassandra patients primary care provider via mail/fax/In Basket message after todays visit.    2.  Hot flashes: I recommended that she  first try changing Cassandra time of day that she takes Cassandra tamoxifen as a lot of times this can help.  I also reviewed with her areas in her survivorship care plan that note nonpharmacologic interventions to alleviate hot flashes.  3.  Breast lymphedema: She continues to follow-up with physical therapy about this regularly.  4. Bone health:   She was given education on specific activities to promote bone health.  5. Cancer screening:  Due to Cassandra Cox's history and her age, she should receive screening for skin cancers, colon cancer, and gynecologic cancers.  Cassandra information and recommendations are listed on Cassandra Cox's comprehensive care plan/treatment summary and were reviewed in detail with Cassandra Cox.    6. Health maintenance and wellness promotion: Ms. HCornickwas encouraged to consume 5-7 servings of fruits and vegetables per day. We reviewed Cassandra "Nutrition Rainbow" handout.  She was also encouraged to engage in moderate to vigorous exercise for 30 minutes per day most days of Cassandra week. We discussed Cassandra [employer] fitness program, which is designed for cancer survivors to help them become more physically fit after cancer treatments.  She was instructed to limit her alcohol consumption and continue to abstain from tobacco use.     7. Support services/counseling: It is not uncommon for this period of Cassandra Cox's cancer care trajectory to be one of many emotions and stressors.   She was given information regarding our available services and encouraged to contact me with any questions or for help enrolling in any of our support group/programs.    Follow up instructions:    -Return to cancer center in 6 months for follow-up with Dr. GPayton Mccallum-  Mammogram due in September 2023 -Follow up with Dr. Marlou Starks at his discretion -She is welcome to return back to [facility] at any time; no additional follow-up needed at this time.  -Consider referral back to survivorship as a long-term survivor  for continued surveillance  Cassandra Cox was provided an opportunity to ask questions and all were answered. Cassandra Cox agreed with Cassandra plan and demonstrated an understanding of Cassandra instructions.    Total encounter time: 40 minutes in face-to-face visit time, chart review, lab review, care coordination, and documentation of Cassandra encounter.  Wilber Bihari, NP 10/18/21 2:32 PM Medical Oncology and Hematology [facility] [place], [place] [address] Tel. [phone]    Fax. [phone]  *Total Encounter Time as defined by Cassandra Centers for Medicare and Medicaid Services includes, in addition to Cassandra face-to-face time of a Cox visit (documented in Cassandra note above) non-face-to-face time: obtaining and reviewing outside history, ordering and reviewing medications, tests or procedures, care coordination (communications with other health care professionals or caregivers) and documentation in Cassandra medical record.

## 2021-10-20 ENCOUNTER — Ambulatory Visit (HOSPITAL_BASED_OUTPATIENT_CLINIC_OR_DEPARTMENT_OTHER): Payer: 59 | Admitting: Certified Registered"

## 2021-10-20 ENCOUNTER — Encounter: Payer: Self-pay | Admitting: Physical Therapy

## 2021-10-20 ENCOUNTER — Encounter (HOSPITAL_BASED_OUTPATIENT_CLINIC_OR_DEPARTMENT_OTHER)
Admission: RE | Disposition: A | Discharge status: Home / Self Care | Payer: Self-pay | Source: Home / Self Care | Attending: General Surgery

## 2021-10-20 ENCOUNTER — Encounter (HOSPITAL_BASED_OUTPATIENT_CLINIC_OR_DEPARTMENT_OTHER): Payer: Self-pay | Admitting: General Surgery

## 2021-10-20 ENCOUNTER — Other Ambulatory Visit: Payer: Self-pay

## 2021-10-20 ENCOUNTER — Ambulatory Visit (HOSPITAL_BASED_OUTPATIENT_CLINIC_OR_DEPARTMENT_OTHER)
Admission: RE | Admit: 2021-10-20 | Discharge: 2021-10-20 | Disposition: A | Discharge status: Home / Self Care | Payer: 59 | Attending: General Surgery | Admitting: General Surgery

## 2021-10-20 DIAGNOSIS — I2510 Atherosclerotic heart disease of native coronary artery without angina pectoris: Secondary | ICD-10-CM | POA: Insufficient documentation

## 2021-10-20 DIAGNOSIS — C773 Secondary and unspecified malignant neoplasm of axilla and upper limb lymph nodes: Secondary | ICD-10-CM | POA: Insufficient documentation

## 2021-10-20 DIAGNOSIS — I252 Old myocardial infarction: Secondary | ICD-10-CM | POA: Insufficient documentation

## 2021-10-20 DIAGNOSIS — E1169 Type 2 diabetes mellitus with other specified complication: Secondary | ICD-10-CM

## 2021-10-20 DIAGNOSIS — Z452 Encounter for adjustment and management of vascular access device: Secondary | ICD-10-CM | POA: Insufficient documentation

## 2021-10-20 DIAGNOSIS — F419 Anxiety disorder, unspecified: Secondary | ICD-10-CM | POA: Insufficient documentation

## 2021-10-20 DIAGNOSIS — Z9221 Personal history of antineoplastic chemotherapy: Secondary | ICD-10-CM | POA: Insufficient documentation

## 2021-10-20 DIAGNOSIS — Z955 Presence of coronary angioplasty implant and graft: Secondary | ICD-10-CM | POA: Insufficient documentation

## 2021-10-20 DIAGNOSIS — Z170 Estrogen receptor positive status [ER+]: Secondary | ICD-10-CM | POA: Insufficient documentation

## 2021-10-20 DIAGNOSIS — C50111 Malignant neoplasm of central portion of right female breast: Secondary | ICD-10-CM | POA: Insufficient documentation

## 2021-10-20 DIAGNOSIS — F32A Depression, unspecified: Secondary | ICD-10-CM | POA: Insufficient documentation

## 2021-10-20 HISTORY — PX: PORT-A-CATH REMOVAL: SHX5289

## 2021-10-20 LAB — GLUCOSE, CAPILLARY: Glucose-Capillary: 82 mg/dL (ref 70–99)

## 2021-10-20 SURGERY — REMOVAL PORT-A-CATH
Anesthesia: Monitor Anesthesia Care | Site: Chest | Laterality: Left

## 2021-10-20 MED ORDER — CHLORHEXIDINE GLUCONATE CLOTH 2 % EX PADS: 6.0000 | MEDICATED_PAD | Freq: Once | CUTANEOUS | Status: DC

## 2021-10-20 MED ORDER — FENTANYL CITRATE (PF) 100 MCG/2ML IJ SOLN
INTRAMUSCULAR | Status: DC | PRN
  Administered 2021-10-20: 100 ug via INTRAVENOUS

## 2021-10-20 MED ORDER — DEXAMETHASONE SODIUM PHOSPHATE 10 MG/ML IJ SOLN
INTRAMUSCULAR | Status: AC
  Filled 2021-10-20: qty 1

## 2021-10-20 MED ORDER — MIDAZOLAM HCL 2 MG/2ML IJ SOLN
INTRAMUSCULAR | Status: AC
  Filled 2021-10-20: qty 2

## 2021-10-20 MED ORDER — FENTANYL CITRATE (PF) 100 MCG/2ML IJ SOLN
INTRAMUSCULAR | Status: AC
  Filled 2021-10-20: qty 2

## 2021-10-20 MED ORDER — LIDOCAINE 2% (20 MG/ML) 5 ML SYRINGE
INTRAMUSCULAR | Status: AC
  Filled 2021-10-20: qty 5

## 2021-10-20 MED ORDER — BUPIVACAINE-EPINEPHRINE 0.25% -1:200000 IJ SOLN
INTRAMUSCULAR | Status: DC | PRN
  Administered 2021-10-20: 10 mL

## 2021-10-20 MED ORDER — HYDROCODONE-ACETAMINOPHEN 5-325 MG PO TABS: 1.0000 | ORAL_TABLET | Freq: Four times a day (QID) | ORAL | 0 refills | Status: DC | PRN

## 2021-10-20 MED ORDER — PROMETHAZINE HCL 25 MG/ML IJ SOLN: 6.2500 mg | INTRAMUSCULAR | Status: DC | PRN

## 2021-10-20 MED ORDER — PROPOFOL 10 MG/ML IV BOLUS
INTRAVENOUS | Status: DC | PRN
  Administered 2021-10-20: 20 mg via INTRAVENOUS

## 2021-10-20 MED ORDER — OXYCODONE HCL 5 MG PO TABS: 5.0000 mg | ORAL_TABLET | Freq: Once | ORAL | Status: DC | PRN

## 2021-10-20 MED ORDER — MIDAZOLAM HCL 5 MG/5ML IJ SOLN
INTRAMUSCULAR | Status: DC | PRN
  Administered 2021-10-20: 2 mg via INTRAVENOUS

## 2021-10-20 MED ORDER — ONDANSETRON HCL 4 MG/2ML IJ SOLN
INTRAMUSCULAR | Status: AC
  Filled 2021-10-20: qty 2

## 2021-10-20 MED ORDER — LACTATED RINGERS IV SOLN: INTRAVENOUS | Status: DC

## 2021-10-20 MED ORDER — ONDANSETRON HCL 4 MG/2ML IJ SOLN
INTRAMUSCULAR | Status: DC | PRN
  Administered 2021-10-20: 4 mg via INTRAVENOUS

## 2021-10-20 MED ORDER — OXYCODONE HCL 5 MG/5ML PO SOLN: 5.0000 mg | Freq: Once | ORAL | Status: DC | PRN

## 2021-10-20 MED ORDER — HYDROMORPHONE HCL 1 MG/ML IJ SOLN: 0.2500 mg | INTRAMUSCULAR | Status: DC | PRN

## 2021-10-20 SURGICAL SUPPLY — 30 items
ADH SKN CLS APL DERMABOND .7 (GAUZE/BANDAGES/DRESSINGS) ×1
APL PRP STRL LF DISP 70% ISPRP (MISCELLANEOUS) ×1
BLADE SURG 15 STRL LF DISP TIS (BLADE) ×1 IMPLANT
BLADE SURG 15 STRL SS (BLADE) ×3
CHLORAPREP W/TINT 26 (MISCELLANEOUS) ×3 IMPLANT
COVER BACK TABLE 60X90IN (DRAPES) ×3 IMPLANT
COVER MAYO STAND STRL (DRAPES) ×3 IMPLANT
DECANTER SPIKE VIAL GLASS SM (MISCELLANEOUS) ×3 IMPLANT
DERMABOND ADVANCED (GAUZE/BANDAGES/DRESSINGS) ×2
DERMABOND ADVANCED .7 DNX12 (GAUZE/BANDAGES/DRESSINGS) ×1 IMPLANT
DRAPE LAPAROTOMY 100X72 PEDS (DRAPES) ×3 IMPLANT
DRAPE UTILITY XL STRL (DRAPES) ×3 IMPLANT
ELECT COATED BLADE 2.86 ST (ELECTRODE) IMPLANT
ELECT REM PT RETURN 9FT ADLT (ELECTROSURGICAL)
ELECTRODE REM PT RTRN 9FT ADLT (ELECTROSURGICAL) IMPLANT
GLOVE SURG ENC MOIS LTX SZ7.5 (GLOVE) ×3 IMPLANT
GLOVE SURG UNDER POLY LF SZ6.5 (GLOVE) ×2 IMPLANT
GLOVE SURG UNDER POLY LF SZ7 (GLOVE) ×2 IMPLANT
GOWN STRL REUS W/ TWL LRG LVL3 (GOWN DISPOSABLE) ×2 IMPLANT
GOWN STRL REUS W/TWL LRG LVL3 (GOWN DISPOSABLE) ×6
NDL HYPO 25X1 1.5 SAFETY (NEEDLE) ×1 IMPLANT
NEEDLE HYPO 25X1 1.5 SAFETY (NEEDLE) ×3 IMPLANT
PACK BASIN DAY SURGERY FS (CUSTOM PROCEDURE TRAY) ×3 IMPLANT
PENCIL SMOKE EVACUATOR (MISCELLANEOUS) IMPLANT
SLEEVE SCD COMPRESS KNEE MED (STOCKING) IMPLANT
SUT MON AB 4-0 PC3 18 (SUTURE) ×3 IMPLANT
SUT VIC AB 3-0 SH 27 (SUTURE) ×3
SUT VIC AB 3-0 SH 27X BRD (SUTURE) ×1 IMPLANT
SYR CONTROL 10ML LL (SYRINGE) ×3 IMPLANT
TOWEL GREEN STERILE FF (TOWEL DISPOSABLE) ×3 IMPLANT

## 2021-10-20 NOTE — Anesthesia Preprocedure Evaluation (Signed)
Anesthesia Evaluation  Patient identified by MRN, date of birth, ID band Patient awake    Reviewed: Allergy & Precautions, NPO status , Patient's Chart, lab work & pertinent test results  History of Anesthesia Complications (+) PONVNegative for: history of anesthetic complications  Airway Mallampati: II  TM Distance: >3 FB Neck ROM: Full    Dental no notable dental hx.    Pulmonary neg pulmonary ROS,    Pulmonary exam normal breath sounds clear to auscultation       Cardiovascular + CAD, + Past MI and + Cardiac Stents (2018)  Normal cardiovascular exam Rhythm:Regular Rate:Normal     Neuro/Psych Anxiety Depression negative neurological ROS     GI/Hepatic negative GI ROS, Neg liver ROS,   Endo/Other  diabetesHypothyroidism   Renal/GU negative Renal ROS  negative genitourinary   Musculoskeletal negative musculoskeletal ROS (+)   Abdominal   Peds  Hematology Plavix   Anesthesia Other Findings  Breast cancer s/p lumpectomy  Echo 06/23/20: EF 60-65%, g2dd, normal RV function, valves unremarkable  Reproductive/Obstetrics                             Anesthesia Physical  Anesthesia Plan  ASA: III  Anesthesia Plan: MAC   Post-op Pain Management:    Induction: Intravenous  PONV Risk Score and Plan: 3 and Ondansetron, Dexamethasone, Midazolam and Treatment may vary due to age or medical condition  Airway Management Planned: Simple Face Mask  Additional Equipment: None  Intra-op Plan:   Post-operative Plan: Extubation in OR  Informed Consent: I have reviewed the patients History and Physical, chart, labs and discussed the procedure including the risks, benefits and alternatives for the proposed anesthesia with the patient or authorized representative who has indicated his/her understanding and acceptance.     Dental advisory given  Plan Discussed with:   Anesthesia Plan  Comments: (PAT note by Karoline Caldwell, PA-C: Follows with cardiology for history of CAD with anterior ST elevation MI in 11/2016 status post PCI/DES to the LAD, pSVT, HLD.  Last seen 07/22/2020, per note doing well from a cardiology perspective.  She was subsequently given clearance on 08/19/2020 to undergo right breast lumpectomy which she had on 50/53/9767 without complication.  Patient reports she was advised to stop Plavix 5 days prior to surgery.  Recently seen by pulmonologist Dr. Elsworth Soho for concern of possible OSA, however Dr. Elsworth Soho recommended deferring home sleep test until she was finished with her cancer treatment.  She will need day of surgery labs and evaluation.  EKG 05/21/2020: Sinus rhythm with short PR.  Rate 95.  Nonspecific ST-T wave abnormality.  No significant change since last tracing.  TTE 06/23/2020: 1. Left ventricular ejection fraction, by estimation, is 60 to 65%. The  left ventricle has normal function. The left ventricle has no regional  wall motion abnormalities. Left ventricular diastolic parameters are  consistent with Grade II diastolic  dysfunction (pseudonormalization).  2. Right ventricular systolic function is normal. The right ventricular  size is normal. Tricuspid regurgitation signal is inadequate for assessing  PA pressure.   Event monitor 06/18/2020: The patient was monitored for 8 days, 5 hours. The predominant rhythm was sinus with an average rate of 87 bpm (range 64-146 bpm in sinus). There were rare PAC's and PVC's. A single 5 beat atrial run occurred with a maximum rate of 164 bpm. No sustained arrhythmia or prolonged pause was observerd. There were no patient triggered events.  Predominantly sinus rhythm with rare PAC's and PVC's, as well as a single brief atrial run.  No significant arrhythmia observed.  Cath and PCI 12/02/2016: Conclusions: Anterior STEMI with thrombotic occlusion of the mid LAD, likely due to acute plaque rupture. Moderate  diffuse atherosclerotic plaquing of the proximal and mid LAD by IVUS. Mildly elevated left ventricular filling pressure. Successful IVUS-guided PCI to the mid LAD with placement of an Integrity Resolute 3.0 x 18 mm drug-eluting stent, post-dilated in the proximal and mid portions with 3.75 mm noncompliant balloon.  Recommendations: Admit to ICU on hospitalist service. TR band protocol. Continue tirofiban x 4 hours. Dual antiplatelet therapy with aspirin and ticagrelor for at least 12 months. Aggressive secondary prevention, including high-intensity statin therapy. Echocardiogram to evaluate LV function.  )        Anesthesia Quick Evaluation

## 2021-10-20 NOTE — Transfer of Care (Signed)
Immediate Anesthesia Transfer of Care Note  Patient: Cassandra Cox  Procedure(s) Performed: REMOVAL PORT-A-CATH (Left: Chest)  Patient Location: PACU  Anesthesia Type:MAC  Level of Consciousness: awake  Airway & Oxygen Therapy: Patient Spontanous Breathing and Patient connected to face mask oxygen  Post-op Assessment: Report given to RN and Post -op Vital signs reviewed and stable  Post vital signs: Reviewed and stable  Last Vitals:  Vitals Value Taken Time  BP    Temp    Pulse 79 10/20/21 1310  Resp    SpO2 100 % 10/20/21 1310  Vitals shown include unvalidated device data.  Last Pain:  Vitals:   10/20/21 1211  TempSrc: Oral  PainSc: 0-No pain      Patients Stated Pain Goal: 5 (16/10/96 0454)  Complications: No notable events documented.

## 2021-10-20 NOTE — Interval H&P Note (Signed)
History and Physical Interval Note:  10/20/2021 12:22 PM  Cassandra Cox  has presented today for surgery, with the diagnosis of RIGHT BREAST CANCER.  The various methods of treatment have been discussed with the patient and family. After consideration of risks, benefits and other options for treatment, the patient has consented to  Procedure(s): REMOVAL PORT-A-CATH (N/A) as a surgical intervention.  The patient's history has been reviewed, patient examined, no change in status, stable for surgery.  I have reviewed the patient's chart and labs.  Questions were answered to the patient's satisfaction.     [name]

## 2021-10-20 NOTE — Op Note (Signed)
10/20/2021  1:06 PM  PATIENT:  Cassandra Cox  60 y.o. female  PRE-OPERATIVE DIAGNOSIS:  RIGHT BREAST CANCER  POST-OPERATIVE DIAGNOSIS:  RIGHT BREAST CANCER  PROCEDURE:  Procedure(s): REMOVAL PORT-A-CATH (Left)  SURGEON:  Surgeon(s) and Role:    * Jovita Kussmaul, MD - Primary  PHYSICIAN ASSISTANT:   ASSISTANTS: none   ANESTHESIA:   local and IV sedation  EBL:  5 mL   BLOOD ADMINISTERED:none  DRAINS: none   LOCAL MEDICATIONS USED:  MARCAINE     SPECIMEN:  No Specimen  DISPOSITION OF SPECIMEN:  N/A  COUNTS:  YES  TOURNIQUET:  * No tourniquets in log *  DICTATION: .Dragon Dictation  After informed consent was obtained the patient was brought to the operating room and placed in the supine position on the operating table.  After adequate IV sedation had been given the patient's left chest was prepped with ChloraPrep, allowed to dry, and draped in usual sterile manner.  An appropriate timeout was performed.  The area around the port was then infiltrated with quarter percent Marcaine until a good field block was created.  A small incision was made through her previous incision with a 15 blade knife.  The incision was carried through the subcutaneous tissue sharply with a 15 blade knife until the capsule surrounding the port was opened.  The 2 anchoring stitches were divided and removed.  The port was then gently pushed out of its pocket and with gentle traction was removed from the patient without difficulty.  Pressure was held for several minutes until the area was completely hemostatic.  The tubing tract was closed with a figure-of-eight 3-0 Vicryl stitch.  The subcutaneous tissue was closed with interrupted 3-0 Vicryl stitches.  The skin was then closed with interrupted 4-0 Monocryl subcuticular stitches.  Dermabond dressings were applied.  The patient tolerated the procedure well.  At the end of the case all needle sponge and instrument counts were correct.  The patient was then  awakened and taken to recovery in stable condition.  PLAN OF CARE: Discharge to home after PACU  PATIENT DISPOSITION:  PACU - hemodynamically stable.   Delay start of Pharmacological VTE agent (>24hrs) due to surgical blood loss or risk of bleeding: not applicable

## 2021-10-20 NOTE — H&P (Signed)
[name] Location: [facility] Patient #: [id number] DOB: 07-Oct-1961 Divorced / Language: Cleophus Molt / Race: White Female   History of Present Illness  The patient is a 60 year old female who presents for a Follow-up for Breast cancer.The patient is a 60 year old white female who is about 6 months status post right breast lumpectomy and sentinel node biopsy for a T1 cN1 A right breast cancer that was ER/PR positive and HER-2 negative with a Ki-67 of 30%.  She did have 1 of 3 nodes that was positive.  Her mammoprint was high risk so she did receive chemotherapy and is now in the middle of radiation therapy.  She seems to be tolerating it well.   Problem List/Past Medical MALIGNANT NEOPLASM OF CENTRAL PORTION OF RIGHT BREAST IN FEMALE, ESTROGEN RECEPTOR POSITIVE (C50.111)    Past Surgical History  Breast Augmentation   Bilateral. Breast Biopsy   Right. Hysterectomy (not due to cancer) - Partial    Diagnostic Studies History  Colonoscopy   5-10 years ago Mammogram   within last year Pap Smear   1-5 years ago  Allergies  PredniSONE (Pak) *CORTICOSTEROIDS*   Swelling. Facial Swelling Sulfa Antibiotics   Hives. Sulfonamide Derivatives   Hives.  Medication History  Xanax  (0.25MG Tablet, Oral) Active. Armour Thyroid  (60MG Tablet, Oral) Active. Aspirin  (81MG Tablet DR, Oral) Active. Lipitor  (80MG Tablet, Oral) Active. Wellbutrin XL  (150MG Tablet ER 24HR, Oral) Active. Cholecalciferol  (250 MCG(10000 UT) Capsule, Oral) Active. Plavix  (75MG Tablet, Oral) Active. Cyanocobalamin  (1000MCG Tablet Chewable, Oral) Active. Zetia  (10MG Tablet, Oral) Active. PROzac  (20MG Capsule, Oral) Active. Xyzal Allergy 24HR  (5MG Tablet, Oral) Active. Cytomel  (5MCG Tablet, Oral) Active. Magonate  (500 (27 Mg)MG Tablet, Oral) Active. metFORMIN HCl ER  (500MG Tablet ER 24HR, Oral) Active. Singulair  (10MG Tablet, Oral) Active. Nitrostat  (0.4MG Tab Sublingual, Sublingual)  Active. Progesterone  (200MG Capsule, Oral) Active. Ambien CR  (12.5MG Tablet ER, Oral) Active. Medications Reconciled   Social History  No alcohol use   No caffeine use   No drug use   Tobacco use   Never smoker.  Family History  Arthritis   Mother. Breast Cancer   Family Members In General. Cerebrovascular Accident   Mother. Depression   Mother. Diabetes Mellitus   Mother. Heart Disease   Mother. Hypertension   Father, Mother. Migraine Headache   Mother.  Pregnancy / Birth History  Age at menarche   7 years. Contraceptive History   Depo-provera, Oral contraceptives. Gravida   2 Length (months) of breastfeeding   3-6 Maternal age   6-30 Para   2 Regular periods    Other Problems  Anxiety Disorder   Depression   Diverticulosis   Hemorrhoids   Hypercholesterolemia   Kidney Stone   Myocardial infarction   Thyroid Disease      Review of Systems  General Not Present- Appetite Loss, Chills, Fatigue, Fever, Night Sweats, Weight Gain and Weight Loss. Skin Not Present- Change in Wart/Mole, Dryness, Hives, Jaundice, New Lesions, Non-Healing Wounds, Rash and Ulcer. HEENT Present- Seasonal Allergies and Wears glasses/contact lenses. Not Present- Earache, Hearing Loss, Hoarseness, Nose Bleed, Oral Ulcers, Ringing in the Ears, Sinus Pain, Sore Throat, Visual Disturbances and Yellow Eyes. Respiratory Present- Snoring. Not Present- Bloody sputum, Chronic Cough, Difficulty Breathing and Wheezing. Breast Not Present- Breast Mass, Breast Pain, Nipple Discharge and Skin Changes. Cardiovascular Not Present- Chest Pain, Difficulty Breathing Lying Down, Leg Cramps, Palpitations, Rapid  Heart Rate, Shortness of Breath and Swelling of Extremities. Gastrointestinal Not Present- Abdominal Pain, Bloating, Bloody Stool, Change in Bowel Habits, Chronic diarrhea, Constipation, Difficulty Swallowing, Excessive gas, Gets full quickly at meals, Hemorrhoids, Indigestion, Nausea, Rectal Pain and  Vomiting. Female Genitourinary Not Present- Frequency, Nocturia, Painful Urination, Pelvic Pain and Urgency. Musculoskeletal Not Present- Back Pain, Joint Pain, Joint Stiffness, Muscle Pain, Muscle Weakness and Swelling of Extremities. Neurological Not Present- Decreased Memory, Fainting, Headaches, Numbness, Seizures, Tingling, Tremor, Trouble walking and Weakness. Psychiatric Present- Anxiety and Depression. Not Present- Bipolar, Change in Sleep Pattern, Fearful and Frequent crying. Endocrine Present- Hot flashes. Not Present- Cold Intolerance, Excessive Hunger, Hair Changes, Heat Intolerance and New Diabetes. Hematology Present- Blood Thinners and Easy Bruising. Not Present- Excessive bleeding, Gland problems, HIV and Persistent Infections.   Physical Exam  General Mental Status - Alert. General Appearance - Consistent with stated age. Hydration - Well hydrated. Voice - Normal.  Head and Neck Head - normocephalic, atraumatic with no lesions or palpable masses. Trachea - midline. Thyroid Gland Characteristics - normal size and consistency.  Eye Eyeball - Bilateral - Extraocular movements intact. Sclera/Conjunctiva - Bilateral - No scleral icterus.  Chest and Lung Exam Chest and lung exam reveals  - quiet, even and easy respiratory effort with no use of accessory muscles and on auscultation, normal breath sounds, no adventitious sounds and normal vocal resonance. Inspection Chest Wall - Normal. Back - normal.  Breast Note:  The bilateral reduction incisions are all healing nicely with no sign of infection or seroma. The skin is healthy. There is the beginnings of some radiation change to the skin of the right breast. There is no palpable mass in either breast. There is no palpable axillary, supraclavicular, or cervical lymphadenopathy   Cardiovascular Cardiovascular examination reveals  - normal heart sounds, regular rate and rhythm with no murmurs and normal pedal pulses  bilaterally.  Abdomen Inspection Inspection of the abdomen reveals - No Hernias. Skin - Scar - no surgical scars. Palpation/Percussion Palpation and Percussion of the abdomen reveal - Soft, Non Tender, No Rebound tenderness, No Rigidity (guarding) and No hepatosplenomegaly. Auscultation Auscultation of the abdomen reveals - Bowel sounds normal.  Neurologic Neurologic evaluation reveals  - alert and oriented x 3 with no impairment of recent or remote memory. Mental Status - Normal.  Musculoskeletal Normal Exam - Left - Upper Extremity Strength Normal and Lower Extremity Strength Normal. Normal Exam - Right - Upper Extremity Strength Normal and Lower Extremity Strength Normal.  Lymphatic Head & Neck  General Head & Neck Lymphatics: Bilateral - Description - Normal. Axillary  General Axillary Region: Bilateral - Description - Normal. Tenderness - Non Tender. Femoral & Inguinal  Generalized Femoral & Inguinal Lymphatics: Bilateral - Description - Normal. Tenderness - Non Tender.    Assessment & Plan  MALIGNANT NEOPLASM OF CENTRAL PORTION OF RIGHT BREAST IN FEMALE, ESTROGEN RECEPTOR POSITIVE (C50.111) Impression: The patient is about 6 months status post right breast lumpectomy for breast cancer. She continues to do well with no clinical evidence of recurrence. At this point she will continue with her radiation therapy. This will likely be followed by an antiestrogen therapy. I have contacted the cancer center about getting her some radioplex to protect her skin during radiation treatment. Otherwise we'll plan to see her back in about 6 months. This patient encounter took 20 minutes today to perform the following: take history, perform exam, review outside records, interpret imaging, counsel the patient on their diagnosis and document encounter, findings &  plan in the EHR Current Plans

## 2021-10-20 NOTE — Discharge Instructions (Signed)

## 2021-10-21 NOTE — Anesthesia Postprocedure Evaluation (Signed)
Anesthesia Post Note  Patient: Cassandra Cox  Procedure(s) Performed: REMOVAL PORT-A-CATH (Left: Chest)     Patient location during evaluation: PACU Anesthesia Type: MAC Level of consciousness: awake and alert Pain management: pain level controlled Vital Signs Assessment: post-procedure vital signs reviewed and stable Respiratory status: spontaneous breathing, nonlabored ventilation, respiratory function stable and patient connected to nasal cannula oxygen Cardiovascular status: stable and blood pressure returned to baseline Postop Assessment: no apparent nausea or vomiting Anesthetic complications: no   No notable events documented.  Last Vitals:  Vitals:   10/20/21 1345 10/20/21 1404  BP: (!) 102/54 128/79  Pulse: 70 83  Resp: 17 16  Temp:  37 C  SpO2: 96% 99%    Last Pain:  Vitals:   10/20/21 1404  TempSrc:   PainSc: 0-No pain                 Belenda Cruise P Elfreda Blanchet

## 2021-10-24 ENCOUNTER — Encounter (HOSPITAL_BASED_OUTPATIENT_CLINIC_OR_DEPARTMENT_OTHER): Payer: Self-pay | Admitting: General Surgery

## 2021-10-25 ENCOUNTER — Other Ambulatory Visit: Payer: Self-pay

## 2021-10-25 ENCOUNTER — Ambulatory Visit: Payer: 59

## 2021-10-25 DIAGNOSIS — C50411 Malignant neoplasm of upper-outer quadrant of right female breast: Secondary | ICD-10-CM | POA: Diagnosis not present

## 2021-10-25 DIAGNOSIS — Z170 Estrogen receptor positive status [ER+]: Secondary | ICD-10-CM

## 2021-10-25 DIAGNOSIS — M25611 Stiffness of right shoulder, not elsewhere classified: Secondary | ICD-10-CM

## 2021-10-25 DIAGNOSIS — R600 Localized edema: Secondary | ICD-10-CM

## 2021-10-25 DIAGNOSIS — Z483 Aftercare following surgery for neoplasm: Secondary | ICD-10-CM

## 2021-10-25 DIAGNOSIS — R293 Abnormal posture: Secondary | ICD-10-CM

## 2021-10-25 NOTE — Therapy (Signed)
[facility] [address] [place], [place], [address] Phone: [phone]   Fax:  [phone]  Physical Therapy Treatment  Patient Details  Name: Cassandra Cox MRN: [id number] Date of Birth: [birth year] Referring Provider (PT): Thedore Mins   Encounter Date: 10/25/2021   PT End of Session - 10/25/21 1509     Visit Number 5    Number of Visits 10    Date for PT Re-Evaluation 11/08/21    PT Start Time 1503    PT Stop Time 1543    PT Time Calculation (min) 40 min    Activity Tolerance Patient tolerated treatment well    Behavior During Therapy [facility] for tasks assessed/performed             Past Medical History:  Diagnosis Date   Anxiety    Complication of anesthesia    Coronary artery disease    a. 11/2016 Cath/PCI: LM nl, LAD 30ost, 73m(3.0x18 Resolute Integrity DES--3.75), LCX nl, RCA nl; b. MV 11/18: negative for ischemia, EF 72%, low risk   Depression    Diabetes mellitus without complication ([facility]    type 2   Diastolic dysfunction    a. 11/2016 Echo: EF 55-60%, Gr2 DD, LAE, nl RV size/fxn.   Family history of breast cancer    History of kidney stones    Hyperlipemia \\[id number]\  Hypothyroidism    Kidney stone 08/2016   Mitral valve prolapse    MVP (mitral valve prolapse)    Neuromuscular disorder (HCC)    bilateral feet   PONV (postoperative nausea and vomiting)    STEMI (ST elevation myocardial infarction) ([facility] 12/02/2016   Vitamin B 12 deficiency     Past Surgical History:  Procedure Laterality Date   ABDOMINAL HYSTERECTOMY  2003   TAH due to adenomyosis   BREAST LUMPECTOMY WITH RADIOACTIVE SEED AND SENTINEL LYMPH NODE BIOPSY Right 10/04/2020   Procedure: RIGHT BREAST REDUCTION LUMPECTOMY WITH RADIOACTIVE SEED AND SENTINEL LYMPH NODE BIOPSY;  Surgeon: TJovita Kussmaul MD;  [place] [facility] [facility]  Service: General;  Laterality: Right;   BREAST REDUCTION SURGERY  1990   BREAST  REDUCTION SURGERY Bilateral 10/04/2020   Procedure: BILATERAL MAMMARY REDUCTION  (BREAST);  Surgeon: DWallace Going DO;  [place]  Service: Plastics;  Laterality: Bilateral;   CARDIAC CATHETERIZATION N/A 12/02/2016   Procedure: Coronary Stent Intervention;  Surgeon: CNelva Bush MD;  [facility];  Service: Cardiovascular;  Laterality: N/A;   CARDIAC CATHETERIZATION N/A 12/02/2016   Procedure: Left Heart Cath and Coronary Angiography;  Surgeon: CNelva Bush MD;  [place];  Service: Cardiovascular;  Laterality: N/A;   CARDIAC CATHETERIZATION N/A 12/02/2016   Procedure: Intravascular Ultrasound/IVUS;  Surgeon: CNelva Bush MD;  [facility];  Service: Cardiovascular;  Laterality: N/A;   CORONARY STENT INTERVENTION     MOUTH SURGERY     Dental implant   PORT-A-CATH REMOVAL Left 10/20/2021   Procedure: REMOVAL PORT-A-CATH;  Surgeon: TJovita Kussmaul MD;  [place] [facility]  Service: General;  Laterality: Left;   PORTACATH PLACEMENT Left 12/13/2020   Procedure: INSERTION PORT-A-CATH LEFT SUBCLAVIAN;  Surgeon: TJovita Kussmaul MD;  [place] [facility]  Service: General;  Laterality: Left;    There were no vitals filed for this visit.   Subjective Assessment - 10/25/21 1504     Subjective Happy to have the portacath out.  Left arm has been a little sore since portacath was removed. May be doing more than I am used to doing. Swelling seems to be improving. Haven't done the MLD since Last week because I have had company. Flexi touch is being shipped and I should have it soon.    Pertinent History Patient was diagnosed on 06/30/2020 with right grade III invasive ductal carcinoma breast cancer. Patient underwent a right lumpectomy and sentinel node biopsy (1/3 nodes positive) on 10/04/2020 with a bilateral reduction. It is ER/PR positive and HER2 negative with a Ki67 of 30%.     Patient Stated Goals check breast swelling    Currently in Pain? Yes    Pain Score 2     Pain Location Shoulder    Pain Orientation Left    Pain Descriptors / Indicators Tender    Multiple Pain Sites No                               [facility] Adult PT Treatment/Exercise - 10/25/21 0001       Manual Therapy   Manual therapy comments compliant with foam and breast is slightly softer    Manual Lymphatic Drainage (MLD) in supine: short neck, 5 diaphragmatic breaths, left axillary and right inguinal LN's, anterior interaxillary pathway, axillo-inguinal pathway, and right breast medial to interaxillary pathway and lateral breast to right axillo-inguinal pathway retracing all steps and ending with LN's. PT performed and had pt return demonstrate pathways and breast sequence and provided appropriate verbal and tactile cues.                          PT Long Term Goals - 08/29/21 1503       PT LONG TERM GOAL #1   Title Pt will be independent in slef breast MLD    Time 6    Period Weeks    Status New    Target Date 10/10/21      PT LONG TERM GOAL #2   Title Pt will report decreased breast swelling/heaviness by 50% or greater    Time 6    Period Weeks    Status New    Target Date 10/10/21      PT LONG TERM GOAL #3   Title Pt will be evaluated for flexitouch or similar for right breast edema    Time 6    Period Weeks    Status New    Target Date 10/10/21      PT LONG TERM GOAL #4   Title Pt will notice softening of right breast fibrosis after MLD    Time 6    Status New    Target Date 10/10/21      PT LONG TERM GOAL #5   Title Pt will be educated in precautions for decreasing risk of lymphedema    Time 6    Status New    Target Date 10/10/21                   Plan - 10/25/21 1511     Clinical Impression Statement PT performed right breast MLD and had pt return demonstrate techniques.  Pt did very well, but required VC's to  prevent sliding and for spacing of strokes. Nodular area still present proximal to nipple.  Flexi-touch has been shipped and should arrive in several days    Personal Factors and Comorbidities Comorbidity 3+  Comorbidities right breast Cancer s/p radiation and chemo, CAD, prior MI, DM    Stability/Clinical Decision Making Stable/Uncomplicated    Rehab Potential Excellent    PT Frequency 2x / week    PT Duration 4 weeks    PT Treatment/Interventions ADLs/Self Care Home Management;Therapeutic exercise;Patient/family education;Manual lymph drainage;Taping;Vasopneumatic Device;Manual techniques    PT Next Visit Plan Check goals,cont MLD/self MLD    Consulted and Agree with Plan of Care Patient             Patient will benefit from skilled therapeutic intervention in order to improve the following deficits and impairments:  Postural dysfunction, Increased edema, Decreased knowledge of precautions  Visit Diagnosis: Aftercare following surgery for neoplasm  Localized edema  Abnormal posture  Malignant neoplasm of upper-outer quadrant of right breast in female, estrogen receptor positive (HCC)  Stiffness of right shoulder, not elsewhere classified     Problem List Patient Active Problem List   Diagnosis Date Noted   Hyperlipidemia associated with type 2 diabetes mellitus ([place]) 05/05/2021   Genetic testing 01/11/2021   Port-A-Cath in place 12/14/2020   Family history of breast cancer    Malignant neoplasm of upper-outer quadrant of right breast in female, estrogen receptor positive ([place]) 08/13/2020   Shortness of breath 02/19/2020   Essential hypertension 09/09/2018   Influenza B 10/18/2017   Unstable angina ([place]) 09/14/2017   Coronary artery disease involving native coronary artery of native heart without angina pectoris 07/18/2017   Insomnia 08/25/2015   Hot flashes, menopausal 08/25/2015   Well woman exam 07/22/2015   Family history of diabetes mellitus 07/15/2014    Vitamin D deficiency 11/03/2009   Hyperlipidemia LDL goal <70 11/03/2009   ANXIETY 11/03/2009   Depression 11/03/2009    Claris Pong, PT 10/25/2021, 3:48 PM  [facility] [address] [place], [place], [address] Phone: [phone]   Fax:  [phone]  Name: CHUNDRA SAUERWEIN MRN: [id number] Date of Birth: 1961/09/11

## 2021-10-29 ENCOUNTER — Other Ambulatory Visit: Payer: Self-pay | Admitting: Internal Medicine

## 2021-11-01 ENCOUNTER — Other Ambulatory Visit: Payer: Self-pay

## 2021-11-01 ENCOUNTER — Ambulatory Visit: Payer: 59

## 2021-11-01 DIAGNOSIS — R600 Localized edema: Secondary | ICD-10-CM

## 2021-11-01 DIAGNOSIS — Z483 Aftercare following surgery for neoplasm: Secondary | ICD-10-CM

## 2021-11-01 DIAGNOSIS — R293 Abnormal posture: Secondary | ICD-10-CM

## 2021-11-01 DIAGNOSIS — C50411 Malignant neoplasm of upper-outer quadrant of right female breast: Secondary | ICD-10-CM | POA: Diagnosis not present

## 2021-11-01 NOTE — Therapy (Signed)
[place] @ [place], [place], [address] Phone: [phone]   Fax:  [phone]  Physical Therapy Treatment  Patient Details  Name: Cassandra Cox MRN: [id number] Date of Birth: 1961/05/03 Referring Provider (PT): Thedore Mins   Encounter Date: 11/01/2021   PT End of Session - 11/01/21 1556     Visit Number 6    Number of Visits 10    Date for PT Re-Evaluation 11/08/21    PT Start Time 1501    PT Stop Time 1556    PT Time Calculation (min) 55 min    Activity Tolerance Patient tolerated treatment well    Behavior During Therapy [facility] for tasks assessed/performed             Past Medical History:  Diagnosis Date   Anxiety    Complication of anesthesia    Coronary artery disease    a. 11/2016 Cath/PCI: LM nl, LAD 30ost, 47m(3.0x18 Resolute Integrity DES--3.75), LCX nl, RCA nl; b. MV 11/18: negative for ischemia, EF 72%, low risk   Depression    Diabetes mellitus without complication ([place]    type 2   Diastolic dysfunction    a. 11/2016 Echo: EF 55-60%, Gr2 DD, LAE, nl RV size/fxn.   Family history of breast cancer    History of kidney stones    Hyperlipemia \[id number]   Hypothyroidism    Kidney stone 08/2016   Mitral valve prolapse    MVP (mitral valve prolapse)    Neuromuscular disorder (HCC)    bilateral feet   PONV (postoperative nausea and vomiting)    STEMI (ST elevation myocardial infarction) ([place] 12/02/2016   Vitamin B 12 deficiency     Past Surgical History:  Procedure Laterality Date   ABDOMINAL HYSTERECTOMY  2003   TAH due to adenomyosis   BREAST LUMPECTOMY WITH RADIOACTIVE SEED AND SENTINEL LYMPH NODE BIOPSY Right 10/04/2020   Procedure: RIGHT BREAST REDUCTION LUMPECTOMY WITH RADIOACTIVE SEED AND SENTINEL LYMPH NODE BIOPSY;  Surgeon: TJovita Kussmaul MD;  [place]  Service: General;  Laterality: Right;   BREAST REDUCTION SURGERY  1990   BREAST  REDUCTION SURGERY Bilateral 10/04/2020   Procedure: BILATERAL MAMMARY REDUCTION  (BREAST);  Surgeon: DWallace Going DO;  [place]  Service: Plastics;  Laterality: Bilateral;   CARDIAC CATHETERIZATION N/A 12/02/2016   Procedure: Coronary Stent Intervention;  Surgeon: CNelva Bush MD;  [place] [facility];  Service: Cardiovascular;  Laterality: N/A;   CARDIAC CATHETERIZATION N/A 12/02/2016   Procedure: Left Heart Cath and Coronary Angiography;  Surgeon: CNelva Bush MD;  [place] [facility];  Service: Cardiovascular;  Laterality: N/A;   CARDIAC CATHETERIZATION N/A 12/02/2016   Procedure: Intravascular Ultrasound/IVUS;  Surgeon: CNelva Bush MD;  [place] [facility];  Service: Cardiovascular;  Laterality: N/A;   CORONARY STENT INTERVENTION     MOUTH SURGERY     Dental implant   PORT-A-CATH REMOVAL Left 10/20/2021   Procedure: REMOVAL PORT-A-CATH;  Surgeon: TJovita Kussmaul MD;  [place]  Service: General;  Laterality: Left;   PORTACATH PLACEMENT Left 12/13/2020   Procedure: INSERTION PORT-A-CATH LEFT SUBCLAVIAN;  Surgeon: TJovita Kussmaul MD;  [place]  Service: General;  Laterality: Left;    There were no vitals filed for this visit.   Subjective Assessment - 11/01/21 1500     Subjective I got the flexi touch and  I tried it last night.  I spoke with Jinny Blossom and there was something in the paperwork that said covered and another thing said not covered. ( Therapist emailed Vicente Males to ask if he can handle this.) THe flexitouch felt good last night when I did it.  My breast felt softer afterwards and this am. I have done some MLD at home and I do pretty well.    Pertinent History Patient was diagnosed on 06/30/2020 with right grade III invasive ductal carcinoma breast cancer. Patient underwent a right lumpectomy and sentinel node biopsy (1/3 nodes positive) on 10/04/2020 with a  bilateral reduction. It is ER/PR positive and HER2 negative with a Ki67 of 30%.    Patient Stated Goals check breast swelling                               [facility] Adult PT Treatment/Exercise - 11/01/21 0001       Manual Therapy   Manual therapy comments compliant with foam and breast is slightly softer    Manual Lymphatic Drainage (MLD) in supine: Therapist read pt instructions while pt performed ;short neck, 5 diaphragmatic breaths, left axillary and right inguinal LN's, anterior interaxillary pathway, axillo-inguinal pathway, and right breast medial to interaxillary pathway and lateral breast to right axillo-inguinal pathway retracing all steps and ending with LN's. pt performed all with only occasional VC's                     PT Education - 11/01/21 1527     Education Details pt educated in warning signs of infection, skin care, precautions for lymphedema    Person(s) Educated Patient    Methods Explanation    Comprehension Verbalized understanding                 PT Long Term Goals - 11/01/21 1514       PT LONG TERM GOAL #1   Title Pt will be independent in self breast MLD    Time 6    Period Weeks    Status On-going    Target Date 11/01/21      PT LONG TERM GOAL #2   Title Pt will report decreased breast swelling/heaviness by 50% or greater    Time 6    Period Weeks    Status Achieved    Target Date 11/01/21      PT LONG TERM GOAL #3   Title Pt will be evaluated for flexitouch or similar for right breast edema    Time 6    Period Weeks    Status Achieved    Target Date 11/01/21      PT LONG TERM GOAL #4   Title Pt will notice softening of right breast fibrosis after MLD    Time 6    Period Weeks    Status Achieved    Target Date 11/01/21      PT LONG TERM GOAL #5   Title Pt will be educated in precautions for decreasing risk of lymphedema    Time 6    Period Weeks    Status Achieved   Target Date 11/01/21                    Plan - 11/01/21 1523     Clinical Impression Statement pt had some paper work from her insurance regarding flexitouch indicating that something may not be paid in January as her  insurance will be changing.  Emailed Derek todaay to see if he can assist with this.  We reviewed Right breast MLD again as I read her the instructions.  She did exceptionally well.  We also reviewed precautions for lymphedema and skin care/warning signs of infection.  We reviewed all goals and pt has achieved nearly all goals established.  There may still be some questions about flexitouch. We will review MLD one more time and consider DC    Personal Factors and Comorbidities Comorbidity 3+    Comorbidities right breast Cancer s/p radiation and chemo, CAD, prior MI, DM    Stability/Clinical Decision Making Stable/Uncomplicated    Rehab Potential Excellent    PT Frequency 2x / week    PT Duration 4 weeks    PT Treatment/Interventions ADLs/Self Care Home Management;Therapeutic exercise;Patient/family education;Manual lymph drainage;Taping;Vasopneumatic Device;Manual techniques    PT Next Visit Plan cont MLD/self MLD    Consulted and Agree with Plan of Care Patient             Patient will benefit from skilled therapeutic intervention in order to improve the following deficits and impairments:  Postural dysfunction, Increased edema, Decreased knowledge of precautions  Visit Diagnosis: Aftercare following surgery for neoplasm  Localized edema  Abnormal posture     Problem List Patient Active Problem List   Diagnosis Date Noted   Hyperlipidemia associated with type 2 diabetes mellitus ([place]) 05/05/2021   Genetic testing 01/11/2021   Port-A-Cath in place 12/14/2020   Family history of breast cancer    Malignant neoplasm of upper-outer quadrant of right breast in female, estrogen receptor positive ([place]) 08/13/2020   Shortness of breath 02/19/2020   Essential hypertension 09/09/2018    Influenza B 10/18/2017   Unstable angina ([place]) 09/14/2017   Coronary artery disease involving native coronary artery of native heart without angina pectoris 07/18/2017   Insomnia 08/25/2015   Hot flashes, menopausal 08/25/2015   Well woman exam 07/22/2015   Family history of diabetes mellitus 07/15/2014   Vitamin D deficiency 11/03/2009   Hyperlipidemia LDL goal <70 11/03/2009   ANXIETY 11/03/2009   Depression 11/03/2009    Claris Pong, PT 11/01/2021, 5:54 PM  [facility] @ [place], [place], [address] Phone: [phone]   Fax:  [phone]  Name: Cassandra Cox MRN: [id number] Date of Birth: 05-Apr-1961

## 2021-11-01 NOTE — Telephone Encounter (Signed)
LVM to schedule

## 2021-11-01 NOTE — Telephone Encounter (Signed)
Please schedule 6 month F/U appointment for refills. Thank you! 

## 2021-11-03 ENCOUNTER — Other Ambulatory Visit: Payer: Self-pay

## 2021-11-03 ENCOUNTER — Encounter: Payer: Self-pay | Admitting: Hematology

## 2021-11-03 ENCOUNTER — Ambulatory Visit: Payer: 59

## 2021-11-03 DIAGNOSIS — R293 Abnormal posture: Secondary | ICD-10-CM

## 2021-11-03 DIAGNOSIS — Z483 Aftercare following surgery for neoplasm: Secondary | ICD-10-CM

## 2021-11-03 DIAGNOSIS — R600 Localized edema: Secondary | ICD-10-CM

## 2021-11-03 DIAGNOSIS — M25611 Stiffness of right shoulder, not elsewhere classified: Secondary | ICD-10-CM

## 2021-11-03 DIAGNOSIS — C50411 Malignant neoplasm of upper-outer quadrant of right female breast: Secondary | ICD-10-CM

## 2021-11-03 NOTE — Patient Instructions (Addendum)
Over Head Pull: Narrow and Wide Grip   Cancer Rehab [phone]   On back, knees bent, feet flat, band across thighs, elbows straight but relaxed. Pull hands apart (start). Keeping elbows straight, bring arms up and over head, hands toward floor. Keep pull steady on band. Hold momentarily. Return slowly, keeping pull steady, back to start. Then do same with a wider grip on the band (past shoulder width) Repeat _5-10__ times. Band color __yellow____   Side Pull: Double Arm   On back, knees bent, feet flat. Arms perpendicular to body, shoulder level, elbows straight but relaxed. Pull arms out to sides, elbows straight. Resistance band comes across collarbones, hands toward floor. Hold momentarily. Slowly return to starting position. Repeat _5-10__ times. Band color _yellow____ Access Code: [email or web address] URL: [email or web address] Date: 11/03/2021 Prepared by: Cheral Almas  Exercises Scapular Retraction with Resistance - 1 x daily - 3 x weekly - 3 sets - 10 reps Scapular Retraction with Resistance Advanced - 1 x daily - 3 x weekly - 1 sets - 10 reps Shoulder External Rotation and Scapular Retraction with Resistance - 1 x daily - 3 x weekly - 1 sets - 10 reps

## 2021-11-03 NOTE — Therapy (Signed)
[facility] @ [place], [place], [address] Phone: [phone]   Fax:  [phone]  Physical Therapy Treatment  Patient Details  Name: Cassandra Cox MRN: [id number] Date of Birth: 05/22/1961 Referring Provider (PT): Thedore Mins   Encounter Date: 11/03/2021   PT End of Session - 11/03/21 1608     Visit Number 7    Number of Visits 10    Date for PT Re-Evaluation 11/08/21    PT Start Time 1502    PT Stop Time 1556    PT Time Calculation (min) 54 min    Activity Tolerance Patient tolerated treatment well    Behavior During Therapy [facility] for tasks assessed/performed             Past Medical History:  Diagnosis Date   Anxiety    Complication of anesthesia    Coronary artery disease    a. 11/2016 Cath/PCI: LM nl, LAD 30ost, 85m(3.0x18 Resolute Integrity DES--3.75), LCX nl, RCA nl; b. MV 11/18: negative for ischemia, EF 72%, low risk   Depression    Diabetes mellitus without complication ([facility]    type 2   Diastolic dysfunction    a. 11/2016 Echo: EF 55-60%, Gr2 DD, LAE, nl RV size/fxn.   Family history of breast cancer    History of kidney stones    Hyperlipemia \[id number]   Hypothyroidism    Kidney stone 08/2016   Mitral valve prolapse    MVP (mitral valve prolapse)    Neuromuscular disorder (HCC)    bilateral feet   PONV (postoperative nausea and vomiting)    STEMI (ST elevation myocardial infarction) ([facility] [place] 12/02/2016   Vitamin B 12 deficiency     Past Surgical History:  Procedure Laterality Date   ABDOMINAL HYSTERECTOMY  2003   TAH due to adenomyosis   BREAST LUMPECTOMY WITH RADIOACTIVE SEED AND SENTINEL LYMPH NODE BIOPSY Right 10/04/2020   Procedure: RIGHT BREAST REDUCTION LUMPECTOMY WITH RADIOACTIVE SEED AND SENTINEL LYMPH NODE BIOPSY;  Surgeon: TJovita Kussmaul MD;  [place]  Service: General;  Laterality: Right;   BREAST REDUCTION SURGERY  1990   BREAST  REDUCTION SURGERY Bilateral 10/04/2020   Procedure: BILATERAL MAMMARY REDUCTION  (BREAST);  Surgeon: DWallace Going DO;  [place]  Service: Plastics;  Laterality: Bilateral;   CARDIAC CATHETERIZATION N/A 12/02/2016   Procedure: Coronary Stent Intervention;  Surgeon: CNelva Bush MD;  [place];  Service: Cardiovascular;  Laterality: N/A;   CARDIAC CATHETERIZATION N/A 12/02/2016   Procedure: Left Heart Cath and Coronary Angiography;  Surgeon: CNelva Bush MD;  [facility];  Service: Cardiovascular;  Laterality: N/A;   CARDIAC CATHETERIZATION N/A 12/02/2016   Procedure: Intravascular Ultrasound/IVUS;  Surgeon: CNelva Bush MD;  [facility] [facility];  Service: Cardiovascular;  Laterality: N/A;   CORONARY STENT INTERVENTION     MOUTH SURGERY     Dental implant   PORT-A-CATH REMOVAL Left 10/20/2021   Procedure: REMOVAL PORT-A-CATH;  Surgeon: TJovita Kussmaul MD;  [place] [facility]  Service: General;  Laterality: Left;   PORTACATH PLACEMENT Left 12/13/2020   Procedure: INSERTION PORT-A-CATH LEFT SUBCLAVIAN;  Surgeon: TJovita Kussmaul MD;  [place]: [place]  Service: General;  Laterality: Left;    There were no vitals filed for this visit.   Subjective Assessment - 11/03/21 1502     Subjective I heard from Flexi touch and  the machine is fully covered. I practiced the MLD and used the machine last night. They are going to come out Jan. 6 and make sure it is all set up correctly    Pertinent History Patient was diagnosed on 06/30/2020 with right grade III invasive ductal carcinoma breast cancer. Patient underwent a right lumpectomy and sentinel node biopsy (1/3 nodes positive) on 10/04/2020 with a bilateral reduction. It is ER/PR positive and HER2 negative with a Ki67 of 30%.    Patient Stated Goals check breast swelling    Currently in Pain? Yes    Pain Score 2    increases  to 8/10 with reaching behind back on the left   Pain Location Shoulder    Pain Orientation Right    Pain Descriptors / Indicators Sharp;Tender    Pain Type Acute pain    Pain Onset 1 to 4 weeks ago    Pain Frequency Intermittent    Aggravating Factors  reaching behind back                               [facility] Adult PT Treatment/Exercise - 11/03/21 0001       Shoulder Exercises: Supine   Horizontal ABduction Strengthening;Both;10 reps    Theraband Level (Shoulder Horizontal ABduction) Level 1 (Yellow)    External Rotation Strengthening;Both;10 reps    Theraband Level (Shoulder External Rotation) Level 1 (Yellow)    Flexion Strengthening;Both;10 reps    Theraband Level (Shoulder Flexion) Level 1 (Yellow)      Shoulder Exercises: Standing   External Rotation Strengthening;Both;5 reps    Theraband Level (Shoulder External Rotation) Level 1 (Yellow)    Extension Strengthening;Both;10 reps    Theraband Level (Shoulder Extension) Level 1 (Yellow)    Retraction Strengthening;Both;10 reps    Theraband Level (Shoulder Retraction) Level 1 (Yellow)      Manual Therapy   Manual Lymphatic Drainage (MLD) in supine, head of table elevated Therapist read pt instructions while pt performed ;short neck, 5 diaphragmatic breaths, left axillary and right inguinal LN's, anterior interaxillary pathway, axillo-inguinal pathway, and right breast medial to interaxillary pathway and lateral breast to right axillo-inguinal pathway retracing all steps and ending with LN's. pt performed all with only occasional VC's                     PT Education - 11/03/21 1606     Education Details pt educated in supine horizontal abd, and flexion with yellow band, standing Sr, shoulder ext, bilateral ER all x 10    Person(s) Educated Patient    Methods Demonstration;Handout    Comprehension Returned demonstration                 PT Long Term Goals - 11/03/21 1613       PT  LONG TERM GOAL #1   Title Pt will be independent in self breast MLD    Time 6    Period Weeks    Status Achieved    Target Date 11/03/21      PT LONG TERM GOAL #2   Title Pt will report decreased breast swelling/heaviness by 50% or greater    Time 6    Period Weeks    Status Achieved    Target Date 11/01/21      PT LONG TERM GOAL #3   Title Pt will be evaluated for flexitouch or similar for right breast edema    Time  6    Period Weeks    Status Achieved    Target Date 11/01/21      PT LONG TERM GOAL #4   Title Pt will notice softening of right breast fibrosis after MLD    Time 6    Period Weeks    Target Date 11/01/21      PT LONG TERM GOAL #5   Title Pt will be educated in precautions for decreasing risk of lymphedema    Time 6    Period Weeks    Status Achieved    Target Date 11/01/21                   Plan - 11/03/21 1609     Clinical Impression Statement Flexi touch problem was cleared up and she is 100% covered and has been using it at home.  They will come check her set up on Jan. 6 in her home.  We reviewed MLD again today and she did an excellent job with very little cueing.  She was also instructed in yellow theraband exercises to strengthen both arms and possibly help with impingement type symptoms in left UE.  Advised her to do in painfree ROM and to discontinue any exs if it causes pain.  She has now achieved all goals and will be discharged to [facility] today. She was told to call with any questions or concerns.    Personal Factors and Comorbidities Comorbidity 3+    Comorbidities right breast Cancer s/p radiation and chemo, CAD, prior MI, DM    Stability/Clinical Decision Making Stable/Uncomplicated    Rehab Potential Excellent    PT Frequency 2x / week    PT Duration 4 weeks    PT Treatment/Interventions ADLs/Self Care Home Management;Therapeutic exercise;Patient/family education;Manual lymph drainage;Taping;Vasopneumatic Device;Manual techniques    PT  Next Visit Plan DC to HEP    PT Home Exercise Plan MLD to right breast or Flexi touch, theraband exercises,ABC class 11/21/2021    Consulted and Agree with Plan of Care Patient             Patient will benefit from skilled therapeutic intervention in order to improve the following deficits and impairments:  Postural dysfunction, Increased edema, Decreased knowledge of precautions  Visit Diagnosis: Aftercare following surgery for neoplasm  Localized edema  Abnormal posture  Malignant neoplasm of upper-outer quadrant of right breast in female, estrogen receptor positive ([place])  Stiffness of right shoulder, not elsewhere classified     Problem List Patient Active Problem List   Diagnosis Date Noted   Hyperlipidemia associated with type 2 diabetes mellitus ([place]) 05/05/2021   Genetic testing 01/11/2021   Port-A-Cath in place 12/14/2020   Family history of breast cancer    Malignant neoplasm of upper-outer quadrant of right breast in female, estrogen receptor positive ([place]) 08/13/2020   Shortness of breath 02/19/2020   Essential hypertension 09/09/2018   Influenza B 10/18/2017   Unstable angina ([place]) 09/14/2017   Coronary artery disease involving native coronary artery of native heart without angina pectoris 07/18/2017   Insomnia 08/25/2015   Hot flashes, menopausal 08/25/2015   Well woman exam 07/22/2015   Family history of diabetes mellitus 07/15/2014   Vitamin D deficiency 11/03/2009   Hyperlipidemia LDL goal <70 11/03/2009   ANXIETY 11/03/2009   Depression 11/03/2009  PHYSICAL THERAPY DISCHARGE SUMMARY  Visits from Start of Care: 7  Current functional level related to goals / functional outcomes: Achieved all goals   Remaining deficits: Pt continues to  have some right breast swelling but has tools to manage at home   Education / Equipment: Sleeve, theraband, compression bra   Patient agrees to discharge. Patient goals were met. Patient is being discharged due  to meeting the stated rehab goals.   Claris Pong, PT 11/03/2021, 4:15 PM  [facility] @ [place], [place], [address] Phone: [phone]   Fax:  [phone]  Name: Cassandra Cox MRN: [id number] Date of Birth: 1961/01/02

## 2021-11-09 ENCOUNTER — Ambulatory Visit: Payer: 59 | Admitting: Radiation Oncology

## 2021-11-14 ENCOUNTER — Ambulatory Visit: Payer: 59 | Attending: Adult Health

## 2021-11-14 ENCOUNTER — Other Ambulatory Visit: Payer: Self-pay

## 2021-11-14 VITALS — Wt 175.1 lb

## 2021-11-14 DIAGNOSIS — Z483 Aftercare following surgery for neoplasm: Secondary | ICD-10-CM | POA: Insufficient documentation

## 2021-11-14 NOTE — Therapy (Signed)
[facility] @ [facility] [place], [place], [address] Phone: [phone]   Fax:  [phone]  Physical Therapy Treatment  Patient Details  Name: Cassandra Cox MRN: [id number] Date of Birth: May 26, 1961 Referring Provider (PT): Thedore Mins   Encounter Date: 11/14/2021   PT End of Session - 11/14/21 1627     Visit Number 7   # unchanged due to screen only   PT Start Time 1620    PT Stop Time 1624    PT Time Calculation (min) 4 min    Activity Tolerance Patient tolerated treatment well    Behavior During Therapy [facility] for tasks assessed/performed             Past Medical History:  Diagnosis Date   Anxiety    Complication of anesthesia    Coronary artery disease    a. 11/2016 Cath/PCI: LM nl, LAD 30ost, 39m(3.0x18 Resolute Integrity DES--3.75), LCX nl, RCA nl; b. MV 11/18: negative for ischemia, EF 72%, low risk   Depression    Diabetes mellitus without complication ([facility]    type 2   Diastolic dysfunction    a. 11/2016 Echo: EF 55-60%, Gr2 DD, LAE, nl RV size/fxn.   Family history of breast cancer    History of kidney stones    Hyperlipemia \\[id number]\  Hypothyroidism    Kidney stone 08/2016   Mitral valve prolapse    MVP (mitral valve prolapse)    Neuromuscular disorder (HCC)    bilateral feet   PONV (postoperative nausea and vomiting)    STEMI (ST elevation myocardial infarction) ([facility] 12/02/2016   Vitamin B 12 deficiency     Past Surgical History:  Procedure Laterality Date   ABDOMINAL HYSTERECTOMY  2003   TAH due to adenomyosis   BREAST LUMPECTOMY WITH RADIOACTIVE SEED AND SENTINEL LYMPH NODE BIOPSY Right 10/04/2020   Procedure: RIGHT BREAST REDUCTION LUMPECTOMY WITH RADIOACTIVE SEED AND SENTINEL LYMPH NODE BIOPSY;  Surgeon: TJovita Kussmaul MD;  [place]  Service: General;  Laterality: Right;   BREAST REDUCTION SURGERY  1990   BREAST REDUCTION SURGERY Bilateral 10/04/2020    Procedure: BILATERAL MAMMARY REDUCTION  (BREAST);  Surgeon: DWallace Going DO;  [place]  Service: Plastics;  Laterality: Bilateral;   CARDIAC CATHETERIZATION N/A 12/02/2016   Procedure: Coronary Stent Intervention;  Surgeon: CNelva Bush MD;  [facility];  Service: Cardiovascular;  Laterality: N/A;   CARDIAC CATHETERIZATION N/A 12/02/2016   Procedure: Left Heart Cath and Coronary Angiography;  Surgeon: CNelva Bush MD;  [place] [facility];  Service: Cardiovascular;  Laterality: N/A;   CARDIAC CATHETERIZATION N/A 12/02/2016   Procedure: Intravascular Ultrasound/IVUS;  Surgeon: CNelva Bush MD;  [facility];  Service: Cardiovascular;  Laterality: N/A;   CORONARY STENT INTERVENTION     MOUTH SURGERY     Dental implant   PORT-A-CATH REMOVAL Left 10/20/2021   Procedure: REMOVAL PORT-A-CATH;  Surgeon: TJovita Kussmaul MD;  [place] [facility]  Service: General;  Laterality: Left;   PORTACATH PLACEMENT Left 12/13/2020   Procedure: INSERTION PORT-A-CATH LEFT SUBCLAVIAN;  Surgeon: TJovita Kussmaul MD;  [place] [facility]  Service: General;  Laterality: Left;    Vitals:   11/14/21 1626  Weight: 175 lb 2 oz (79.4 kg)     Subjective Assessment - 11/14/21 1626     Subjective Pt returns for her 3 month L-Dex  screen.    Pertinent History Patient was diagnosed on 06/30/2020 with right grade III invasive ductal carcinoma breast cancer. Patient underwent a right lumpectomy and sentinel node biopsy (1/3 nodes positive) on 10/04/2020 with a bilateral reduction. It is ER/PR positive and HER2 negative with a Ki67 of 30%.                    L-DEX FLOWSHEETS - 11/14/21 1600       L-DEX LYMPHEDEMA SCREENING   Measurement Type Unilateral    L-DEX MEASUREMENT EXTREMITY Upper Extremity    POSITION  Standing    DOMINANT SIDE Right    At Risk Side Right    BASELINE SCORE  (UNILATERAL) -2.2    L-DEX SCORE (UNILATERAL) 4    VALUE CHANGE (UNILAT) 6.2                                     PT Long Term Goals - 11/03/21 1613       PT LONG TERM GOAL #1   Title Pt will be independent in self breast MLD    Time 6    Period Weeks    Status Achieved    Target Date 11/03/21      PT LONG TERM GOAL #2   Title Pt will report decreased breast swelling/heaviness by 50% or greater    Time 6    Period Weeks    Status Achieved    Target Date 11/01/21      PT LONG TERM GOAL #3   Title Pt will be evaluated for flexitouch or similar for right breast edema    Time 6    Period Weeks    Status Achieved    Target Date 11/01/21      PT LONG TERM GOAL #4   Title Pt will notice softening of right breast fibrosis after MLD    Time 6    Period Weeks    Target Date 11/01/21      PT LONG TERM GOAL #5   Title Pt will be educated in precautions for decreasing risk of lymphedema    Time 6    Period Weeks    Status Achieved    Target Date 11/01/21                   Plan - 11/14/21 1708     Clinical Impression Statement Pt returns for her 3 month L-Dex screen. Her change from baseline of -0.1 is WNLs so no further treatment is required at this time except to cont every 3 month L-Dex screens which pt is agreeable to.    PT Next Visit Plan Cont every 3 month L-Dex screens for up to 2 years from her SLNB (~10/04/2022)    Consulted and Agree with Plan of Care Patient             Patient will benefit from skilled therapeutic intervention in order to improve the following deficits and impairments:     Visit Diagnosis: Aftercare following surgery for neoplasm     Problem List Patient Active Problem List   Diagnosis Date Noted   Hyperlipidemia associated with type 2 diabetes mellitus ([place]) 05/05/2021   Genetic testing 01/11/2021   Port-A-Cath in place 12/14/2020   Family history of breast cancer    Malignant neoplasm of  upper-outer quadrant of right breast in female, estrogen receptor positive ([place]) 08/13/2020  Shortness of breath 02/19/2020   Essential hypertension 09/09/2018   Influenza B 10/18/2017   Unstable angina (HCC) 09/14/2017   Coronary artery disease involving native coronary artery of native heart without angina pectoris 07/18/2017   Insomnia 08/25/2015   Hot flashes, menopausal 08/25/2015   Well woman exam 07/22/2015   Family history of diabetes mellitus 07/15/2014   Vitamin D deficiency 11/03/2009   Hyperlipidemia LDL goal <70 11/03/2009   ANXIETY 11/03/2009   Depression 11/03/2009    [name], PTA 11/14/2021, 5:10 PM  [place] @ [facility] [place], [place], [address] Phone: [phone]   Fax:  [phone]  Name: Cassandra Cox MRN: [id number] Date of Birth: 09/28/1961

## 2021-12-07 ENCOUNTER — Other Ambulatory Visit: Payer: Self-pay

## 2021-12-07 ENCOUNTER — Encounter: Payer: Self-pay | Admitting: Hematology

## 2021-12-07 ENCOUNTER — Ambulatory Visit
Admission: RE | Admit: 2021-12-07 | Discharge: 2021-12-07 | Disposition: A | Discharge status: Home / Self Care | Payer: 59 | Source: Ambulatory Visit | Attending: Radiation Oncology | Admitting: Radiation Oncology

## 2021-12-07 ENCOUNTER — Encounter: Payer: Self-pay | Admitting: Radiation Oncology

## 2021-12-07 VITALS — BP 135/77 | HR 99 | Resp 18 | Wt 174.3 lb

## 2021-12-07 DIAGNOSIS — Z923 Personal history of irradiation: Secondary | ICD-10-CM | POA: Diagnosis not present

## 2021-12-07 DIAGNOSIS — Z79810 Long term (current) use of selective estrogen receptor modulators (SERMs): Secondary | ICD-10-CM | POA: Insufficient documentation

## 2021-12-07 DIAGNOSIS — C50411 Malignant neoplasm of upper-outer quadrant of right female breast: Secondary | ICD-10-CM | POA: Diagnosis present

## 2021-12-07 DIAGNOSIS — Z170 Estrogen receptor positive status [ER+]: Secondary | ICD-10-CM | POA: Diagnosis not present

## 2021-12-07 NOTE — Progress Notes (Signed)
Radiation Oncology Follow up Note  Name: Cassandra Cox   Date:   12/07/2021 MRN:  [id number] DOB: 21-May-1961    This 61 y.o. female presents to the clinic today for 9-monthfollow-up status post whole breast radiation to her right breast for a T1N1 ER/PR positive HER2 negative invasive mammary carcinoma status post wide local excision and adjuvant chemotherapy.  REFERRING PROVIDER: No ref. provider found  HPI:.  Patient is a 61year old female now out 6 months having completed whole breast radiation to her right breast for an ER/PR positive HER2 negative stage Ib invasive mammary carcinoma seen today in routine follow-up she is doing well.  She specifically denies breast tenderness cough or bone pain she has some minimal lymphedema of her right breast which may be a seroma..  She had mammograms back in September which I have reviewed were BI-RADS 2 benign.  Patient has had bilateral mamma plastic surgery.  COMPLICATIONS OF TREATMENT: none  FOLLOW UP COMPLIANCE: keeps appointments   PHYSICAL EXAM:  BP 135/77 (BP Location: Left Arm, Patient Position: Sitting)    Pulse 99    Resp 18    Wt 174 lb 4.8 oz (79.1 kg)    BMI 30.88 kg/m  Lungs are clear to A&P cardiac examination essentially unremarkable with regular rate and rhythm. No dominant mass or nodularity is noted in either breast in 2 positions examined. Incision is well-healed. No axillary or supraclavicular adenopathy is appreciated. Cosmetic result is excellent.  Well-developed well-nourished patient in NAD. HEENT reveals PERLA, EOMI, discs not visualized.  Oral cavity is clear. No oral mucosal lesions are identified. Neck is clear without evidence of cervical or supraclavicular adenopathy. Lungs are clear to A&P. Cardiac examination is essentially unremarkable with regular rate and rhythm without murmur rub or thrill. Abdomen is benign with no organomegaly or masses noted. Motor sensory and DTR levels are equal and symmetric in the upper and  lower extremities. Cranial nerves II through XII are grossly intact. Proprioception is intact. No peripheral adenopathy or edema is identified. No motor or sensory levels are noted. Crude visual fields are within normal range.  RADIOLOGY RESULTS: Mammograms reviewed compatible with above-stated findings  PLAN: Present time patient is doing well with no evidence of disease 6 months out.  She continues on tamoxifen without side effect.  I am otherwise pleased with her overall progress.  I have asked to see her back in 6 months for follow-up.  Patient knows to call with any concerns.  I would like to take this opportunity to thank you for allowing me to participate in the care of your patient..Noreene Filbert MD

## 2021-12-20 ENCOUNTER — Encounter (HOSPITAL_COMMUNITY): Payer: Self-pay

## 2022-01-07 ENCOUNTER — Encounter: Payer: Self-pay | Admitting: Hematology

## 2022-01-11 ENCOUNTER — Other Ambulatory Visit: Payer: Self-pay

## 2022-01-11 ENCOUNTER — Ambulatory Visit: Payer: 59 | Admitting: Internal Medicine

## 2022-01-11 ENCOUNTER — Encounter: Payer: Self-pay | Admitting: Internal Medicine

## 2022-01-11 VITALS — BP 130/78 | HR 97 | Ht 63.0 in | Wt 174.0 lb

## 2022-01-11 DIAGNOSIS — E785 Hyperlipidemia, unspecified: Secondary | ICD-10-CM | POA: Diagnosis not present

## 2022-01-11 DIAGNOSIS — I2510 Atherosclerotic heart disease of native coronary artery without angina pectoris: Secondary | ICD-10-CM

## 2022-01-11 DIAGNOSIS — Z170 Estrogen receptor positive status [ER+]: Secondary | ICD-10-CM

## 2022-01-11 DIAGNOSIS — E1169 Type 2 diabetes mellitus with other specified complication: Secondary | ICD-10-CM | POA: Diagnosis not present

## 2022-01-11 DIAGNOSIS — C50411 Malignant neoplasm of upper-outer quadrant of right female breast: Secondary | ICD-10-CM | POA: Diagnosis not present

## 2022-01-11 DIAGNOSIS — I10 Essential (primary) hypertension: Secondary | ICD-10-CM

## 2022-01-11 NOTE — Progress Notes (Signed)
? ?Follow-up Outpatient Visit ?Date: 01/11/2022 ? ?Primary Care Provider: ?Pcp, No ?No address on file ? ?Chief Complaint: Follow-up coronary artery disease ? ?HPI:  Cassandra Cox is a 61 y.o. female with history of coronary artery disease status post anterior STEMI (11/2016), hyperlipidemia, type 2 diabetes mellitus, breast cancer, and anxiety, who presents for follow-up of coronary artery disease and dyspnea on exertion.  I last saw her in 04/2021, at which time she was feeling well.  She had not had any further chest pain since completing chemotherapy.  She was scheduled to begin tamoxifen in August.  We agreed to stop clopidogrel and continue with low-dose aspirin for single antiplatelet therapy. ? ?Today, Cassandra Cox reports that she has been feeling fairly well.  She denies chest pain, shortness of breath, lightheadedness, and edema.  She notes sporadic transient flutters in her chest.  She has been having some achiness in her body and wonders if it could be due to atorvastatin.  This began after she completed radiation treatments for her breast cancer.  She also notes some hot flashes associated with tamoxifen, which she will continue for 5 years. ? ?-------------------------------------------------------------------------------------------------- ? ?Cardiovascular History & Procedures: ?Cardiovascular Problems: ?Coronary artery disease status post anterior STEMI (11/2016) ?  ?Risk Factors: ?Known CAD, hyperlipidemia, and type 2 diabetes mellitus ?  ?Cath/PCI: ?LHC/PCI (12/02/16): LMCA normal. LAD 30% ostial and 99% mid vessel disease. LCx and RCA without significant disease. Successful IVUS-guided PCI to mid LAD with placement of Integrity Resolute 3.0 x 18 mm drug-eluting stent postdilated with 3.75 mm Bushyhead balloon. ?  ?CV Surgery: ?None ?  ?EP Procedures and Devices: ?None ?  ?Non-Invasive Evaluation(s): ?Limited TTE (03/29/2021): Normal LV size and wall thickness.  LVEF 55-60% with grade 1 diastolic dysfunction.   Normal RV size and function.  Normal PA pressure.  No significant valvular abnormality. ?TTE (06/23/2020): Normal LV size and wall thickness.  LVEF 60-65% with grade 2 diastolic dysfunction.  Normal RV size and function.  No significant valvular abnormality. ?TTE (09/14/2017): Normal LV size.  LVEF 55-60% with normal wall motion.  Grade 2 diastolic dysfunction with elevated filling pressure.  Normal RV size and function.  No significant valvular abnormality. ?Pharmacologic MPI (09/14/2017): Normal study without ischemia or scar.  LVEF 72%. ?TTE (12/03/16): Normal LV size with mild LVH. LVEF 55-60% with grade 2 diastolic dysfunction. Left atrial enlargement. Normal RV size and function. No significant valvular abnormalities. ? ?Recent CV Pertinent Labs: ?Lab Results  ?Component Value Date  ? CHOL 153 01/27/2021  ? HDL 63 01/27/2021  ? [place] 76 01/27/2021  ? LDLDIRECT 142.2 12/04/2013  ? TRIG 71 01/27/2021  ? CHOLHDL 2.5 03/03/2020  ? CHOLHDL 2.3 09/14/2017  ? INR 1.03 09/14/2017  ? K 4.4 10/18/2021  ? MG 1.7 12/04/2016  ? BUN 16 10/18/2021  ? BUN 18 03/03/2020  ? CREATININE 0.56 10/18/2021  ? CREATININE 0.74 05/03/2021  ? ? ?Past medical and surgical history were reviewed and updated in EPIC. ? ?Current Meds  ?Medication Sig  ? ALPRAZolam (XANAX) 0.25 MG tablet Take 0.25 mg by mouth 3 (three) times daily as needed for anxiety.  ? ARMOUR THYROID 60 MG tablet Take 60 mg by mouth daily.  ? aspirin EC 81 MG tablet Take 81 mg by mouth daily.  ? atorvastatin (LIPITOR) 80 MG tablet TAKE 1 TABLET BY MOUTH DAILY AT 6PM  ? buPROPion (WELLBUTRIN XL) 150 MG 24 hr tablet Take 300 mg by mouth daily.   ? Cholecalciferol (VITAMIN D-3) 5000  UNITS TABS Take 1 tablet by mouth daily.  ? ezetimibe (ZETIA) 10 MG tablet Take 1 tablet (10 mg total) by mouth daily.  ? FLUoxetine (PROZAC) 20 MG capsule Take 20 mg by mouth daily.   ? gabapentin (NEURONTIN) 300 MG capsule Take 1 capsule (300 mg total) by mouth at bedtime.  ? levocetirizine  (XYZAL) 5 MG tablet Take 1 tablet at bedtime by mouth.  ? liothyronine (CYTOMEL) 5 MCG tablet Take 10 mcg by mouth daily.  ? magnesium gluconate (MAGONATE) 500 MG tablet Take 1,000 mg by mouth at bedtime.  ? metFORMIN (GLUCOPHAGE-XR) 500 MG 24 hr tablet Take 500 mg by mouth 2 (two) times daily.  ? montelukast (SINGULAIR) 10 MG tablet Take 10 mg by mouth every morning.  ? nitroGLYCERIN (NITROSTAT) 0.4 MG SL tablet Place 1 tablet (0.4 mg total) under the tongue every 5 (five) minutes as needed for chest pain. For maximum of 3 doses.  ? tamoxifen (NOLVADEX) 20 MG tablet Take 20 mg by mouth daily.  ? valACYclovir (VALTREX) 500 MG tablet Take 500 mg by mouth daily as needed.  ? vitamin C (ASCORBIC ACID) 500 MG tablet Take 500 mg by mouth daily.  ? zinc gluconate 50 MG tablet Take 50 mg by mouth daily.  ? zolpidem (AMBIEN CR) 12.5 MG CR tablet Take 12.5 mg by mouth at bedtime as needed for sleep.  ? ? ?Allergies: Prednisone, Sulfa antibiotics, Sulfasalazine, and Sulfonamide derivatives ? ?Social History  ? ?Tobacco Use  ? Smoking status: Never  ? Smokeless tobacco: Never  ?Vaping Use  ? Vaping Use: Never used  ?Substance Use Topics  ? Alcohol use: No  ? Drug use: No  ? ? ?Family History  ?Problem Relation Age of Onset  ? Heart disease Mother   ? Diabetes Mother   ? Stroke Mother   ? Heart attack Mother   ? Atrial fibrillation Mother   ? Hypertension Mother   ? Hyperlipidemia Mother   ? COPD Father   ? Hypertension Father   ? Hyperlipidemia Father   ? Breast cancer Maternal Aunt 66  ?     x2 age 45  ? Diabetes Maternal Grandfather   ? ? ?Review of Systems: ?A 12-system review of systems was performed and was negative except as noted in the HPI. ? ?-------------------------------------------------------------------------------------------------- ? ?Physical Exam: ?BP 130/78 (BP Location: Left Arm, Patient Position: Sitting, Cuff Size: Large)   Pulse 97   Ht 5' 3"  (1.6 m)   Wt 174 lb (78.9 kg)   SpO2 98%   BMI 30.82  kg/m?  ? ?General:  NAD. ?Neck: No JVD or HJR. ?Lungs: Clear to auscultation bilaterally without wheezes or crackles. ?Heart: Regular rate and rhythm without murmurs, rubs, or gallops. ?Abdomen: Soft, nontender, nondistended. ?Extremities: No lower extremity edema. ? ?EKG: Normal sinus rhythm with anterolateral and inferior ST segment depressions, chronic dating back to 03/2017. ? ?Lab Results  ?Component Value Date  ? WBC 4.7 05/03/2021  ? HGB 12.6 05/03/2021  ? HCT 39.3 05/03/2021  ? MCV 94.7 05/03/2021  ? PLT 264 05/03/2021  ? ? ?Lab Results  ?Component Value Date  ? NA 139 10/18/2021  ? K 4.4 10/18/2021  ? CL 106 10/18/2021  ? CO2 24 10/18/2021  ? BUN 16 10/18/2021  ? CREATININE 0.56 10/18/2021  ? GLUCOSE 103 (H) 10/18/2021  ? ALT 32 05/03/2021  ? ? ?Lab Results  ?Component Value Date  ? CHOL 153 01/27/2021  ? HDL 63 01/27/2021  ? [place]  76 01/27/2021  ? LDLDIRECT 142.2 12/04/2013  ? TRIG 71 01/27/2021  ? CHOLHDL 2.5 03/03/2020  ? ? ?-------------------------------------------------------------------------------------------------- ? ?ASSESSMENT AND PLAN: ?Coronary artery disease: ?Cassandra Cox does not have any angina.  EKG shows stable anterolateral and inferior ST depressions that date back to the immediate post STEMI/PCI time.  We will continue her current medications for secondary prevention with the exception of a statin holiday to see if her body aches improve. ? ?Hyperlipidemia associated with type 2 diabetes mellitus: ?Lipids had been borderline on last check a year ago with LDL of 76.  Given body aches, we have agreed to a 4-week statin holiday.  If symptoms improve, I would favor switching to rosuvastatin with continuation of ezetimibe. ? ?Breast cancer: ?Cassandra Cox has completed chemotherapy and radiation and is currently receiving tamoxifen.  Alden Server of some of her body aches could be related to tamoxifen if symptoms do not improve with statin holiday.  Continue follow-up with oncology. ? ?Follow-up:  Return to clinic in 6 months. ? ?Nelva Bush, MD ?01/11/2022 ?4:38 PM ? ?

## 2022-01-11 NOTE — Patient Instructions (Signed)
Medication Instructions:  ? ?Your physician has recommended you make the following change in your medication:  ? ?HOLD Atorvastatin (Lipitor) for 4 weeks. Call our office after 4 weeks to let us know if "achy" symptoms improve. ? ?*If you need a refill on your cardiac medications before your next appointment, please call your pharmacy* ? ? ?Lab Work: ? ?None ordered ? ?Testing/Procedures: ? ?None ordered ? ? ?Follow-Up: ?At [facility], you and your health needs are our priority.  As part of our continuing mission to provide you with exceptional heart care, we have created designated Provider Care Teams.  These Care Teams include your primary Cardiologist (physician) and Advanced Practice Providers (APPs -  Physician Assistants and Nurse Practitioners) who all work together to provide you with the care you need, when you need it. ? ?We recommend signing up for the patient portal called "MyChart".  Sign up information is provided on this After Visit Summary.  MyChart is used to connect with patients for Virtual Visits (Telemedicine).  Patients are able to view lab/test results, encounter notes, upcoming appointments, etc.  Non-urgent messages can be sent to your provider as well.   ?To learn more about what you can do with MyChart, go to [email or web address].   ? ?Your next appointment:   ?6 month(s) ? ?The format for your next appointment:   ?In Person ? ?Provider:   ?You may see Nelva Bush, MD or one of the following Advanced Practice Providers on your designated Care Team:   ?Murray Hodgkins, NP ?Christell Faith, PA-C ?Cadence Kathlen Mody, PA-C ? ?

## 2022-01-12 ENCOUNTER — Encounter: Payer: Self-pay | Admitting: Internal Medicine

## 2022-02-13 ENCOUNTER — Ambulatory Visit: Payer: 59 | Attending: Adult Health

## 2022-02-13 VITALS — Wt 176.0 lb

## 2022-02-13 DIAGNOSIS — Z483 Aftercare following surgery for neoplasm: Secondary | ICD-10-CM

## 2022-02-13 NOTE — Therapy (Signed)
?OUTPATIENT PHYSICAL THERAPY SOZO SCREENING NOTE ? ? ?Patient Name: Cassandra Cox ?MRN: [id number] ?DOB:22-May-1961, 61 y.o., female ?Today's Date: 02/13/2022 ? ?PCP: Pcp, No ?REFERRING PROVIDER: Deanne Coffer* ? ? PT End of Session - 02/13/22 1610   ? ? Visit Number 7   # unchanged due to screen only  ? ?  ?  ? ?  ? ? ?Past Medical History:  ?Diagnosis Date  ? Anxiety   ? Complication of anesthesia   ? Coronary artery disease   ? a. 11/2016 Cath/PCI: LM nl, LAD 30ost, 56m(3.0x18 Resolute Integrity DES--3.75), LCX nl, RCA nl; b. MV 11/18: negative for ischemia, EF 72%, low risk  ? Depression   ? Diabetes mellitus without complication ([facility]   ? type 2  ? Diastolic dysfunction   ? a. 11/2016 Echo: EF 55-60%, Gr2 DD, LAE, nl RV size/fxn.  ? Family history of breast cancer   ? History of kidney stones   ? Hyperlipemia \\[id number]\ ? Hypothyroidism   ? Kidney stone 08/2016  ? Mitral valve prolapse   ? MVP (mitral valve prolapse)   ? Neuromuscular disorder ([place]   ? bilateral feet  ? PONV (postoperative nausea and vomiting)   ? STEMI (ST elevation myocardial infarction) ([facility] 12/02/2016  ? Vitamin B 12 deficiency   ? ?Past Surgical History:  ?Procedure Laterality Date  ? ABDOMINAL HYSTERECTOMY  2003  ? TAH due to adenomyosis  ? BREAST LUMPECTOMY WITH RADIOACTIVE SEED AND SENTINEL LYMPH NODE BIOPSY Right 10/04/2020  ? Procedure: RIGHT BREAST REDUCTION LUMPECTOMY WITH RADIOACTIVE SEED AND SENTINEL LYMPH NODE BIOPSY;  Surgeon: TJovita Kussmaul MD;  [place] [facility]  Service: General;  Laterality: Right;  ? [place] ? BREAST REDUCTION SURGERY Bilateral 10/04/2020  ? Procedure: BILATERAL MAMMARY REDUCTION  (BREAST);  Surgeon: DWallace Going DO;  [place] [facility]  Service: Plastics;  Laterality: Bilateral;  ? CARDIAC CATHETERIZATION N/A 12/02/2016  ? Procedure: Coronary Stent Intervention;  Surgeon: CNelva Bush MD;  [facility];   Service: Cardiovascular;  Laterality: N/A;  ? CARDIAC CATHETERIZATION N/A 12/02/2016  ? Procedure: Left Heart Cath and Coronary Angiography;  Surgeon: CNelva Bush MD;  [facility];  Service: Cardiovascular;  Laterality: N/A;  ? CARDIAC CATHETERIZATION N/A 12/02/2016  ? Procedure: Intravascular Ultrasound/IVUS;  Surgeon: CNelva Bush MD;  [facility];  Service: Cardiovascular;  Laterality: N/A;  ? CORONARY STENT INTERVENTION    ? MOUTH SURGERY    ? Dental implant  ? PORT-A-CATH REMOVAL Left 10/20/2021  ? Procedure: REMOVAL PORT-A-CATH;  Surgeon: TJovita Kussmaul MD;  [place]  Service: General;  Laterality: Left;  ? PORTACATH PLACEMENT Left 12/13/2020  ? Procedure: INSERTION PORT-A-CATH LEFT SUBCLAVIAN;  Surgeon: TJovita Kussmaul MD;  [place] [facility]  Service: General;  Laterality: Left;  ? ?Patient Active Problem List  ? Diagnosis Date Noted  ? Hyperlipidemia associated with type 2 diabetes mellitus ([facility] 05/05/2021  ? Genetic testing 01/11/2021  ? Port-A-Cath in place 12/14/2020  ? Family history of breast cancer   ? Malignant neoplasm of upper-outer quadrant of right breast in female, estrogen receptor positive ([place] 08/13/2020  ? Shortness of breath 02/19/2020  ? Essential hypertension 09/09/2018  ? Influenza B 10/18/2017  ? Unstable angina ([facility] [place] 09/14/2017  ? Coronary artery disease involving native coronary artery of native heart without angina pectoris 07/18/2017  ? Insomnia 08/25/2015  ?  Hot flashes, menopausal 08/25/2015  ? Well woman exam 07/22/2015  ? Family history of diabetes mellitus 07/15/2014  ? Vitamin D deficiency 11/03/2009  ? Hyperlipidemia LDL goal <70 11/03/2009  ? ANXIETY 11/03/2009  ? Depression 11/03/2009  ? ? ?REFERRING DIAG: right breast cancer at risk for lymphedema ? ?THERAPY DIAG: Aftercare following surgery for neoplasm ? ?PERTINENT HISTORY: Patient was diagnosed on 06/30/2020 with right grade III  invasive ductal carcinoma breast cancer. Patient underwent a right lumpectomy and sentinel node biopsy (1/3 nodes positive) on 10/04/2020 with a bilateral reduction. It is ER/PR positive and HER2 negative with a Ki67 of 30%. ? ?PRECAUTIONS: right UE Lymphedema risk, None ? ?SUBJECTIVE: Pt returns for her 3 month L-Dex screen. "I've started noticing some firmness in my lateral Rt breast and the skin looks like an orange peel. I missed a few weeks of my Flexitouch and wearing my compression bra due to travel." ? ?PAIN:  ?Are you having pain? No ? ?SOZO SCREENING: ?Patient was assessed today using the SOZO machine to determine the lymphedema index score. This was compared to her baseline score. It was determined that she is within the recommended range when compared to her baseline and no further action is needed at this time. She will continue SOZO screenings. These are done every 3 months for 2 years post operatively followed by every 6 months for 2 years, and then annually. Encouraged pt to get back into routine of daily Flexitouch followed by self MLD to area of fibrosis and daily wear of compression bra. Pt verbalized good understanding and knows she should call us if no changes after a few weeks of resuming her self care.  ? ? ? ?[name], PTA ?02/13/2022, 4:17 PM ? ?  ? ?

## 2022-02-21 ENCOUNTER — Other Ambulatory Visit: Payer: Self-pay | Admitting: Internal Medicine

## 2022-02-21 NOTE — Telephone Encounter (Signed)
Lmovm to verify if pt is taking Atorvastin. ?

## 2022-02-28 ENCOUNTER — Other Ambulatory Visit: Payer: Self-pay | Admitting: Internal Medicine

## 2022-04-11 NOTE — Progress Notes (Signed)
Patient Care Team: Pcp, No as PCP - General End, Harrell Gave, MD as PCP - Cardiology (Cardiology) Jovita Kussmaul, MD as Consulting Physician (General Surgery) Kyung Rudd, MD as Consulting Physician (Radiation Oncology)  DIAGNOSIS:  Encounter Diagnosis  Name Primary?   Malignant neoplasm of upper-outer quadrant of right breast in female, estrogen receptor positive ([place])     SUMMARY OF ONCOLOGIC HISTORY: Oncology History Overview Note  Cancer Staging Malignant neoplasm of upper-outer quadrant of right breast in female, estrogen receptor positive ([place]) Staging form: Breast, AJCC 8th Edition - Clinical stage from 08/18/2020: Stage IA (cT1c, cN0, cM0, G3, ER+, PR+, HER2-) - Unsigned    Malignant neoplasm of upper-outer quadrant of right breast in female, estrogen receptor positive ([place])  07/20/2020 Mammogram   IMPRESSION The 1.1 cm irregular mass in the uper outer right breast, 10-11:00 position, 7cm form the nipple is indeterminate.    08/09/2020 Initial Biopsy   Diagnosis 08/09/20 Breast, right, needle core biopsy -INVASIVE MAMMARY CARCINOMA -MAMMARY CARCINOMA IN SITU -CALCIFICATIONS -SEE COMMENT   -Grade 3  E-cadherin is POSITIVE supporting a ductal origin   08/09/2020 Receptors her2   ER 95% PR - 90% Ki67 - 30% HER2 NEGATIVE   08/13/2020 Initial Diagnosis   Malignant neoplasm of upper-outer quadrant of right breast in female, estrogen receptor positive ([place])   10/04/2020 Cancer Staging   Staging form: Breast, AJCC 8th Edition - Pathologic stage from 10/04/2020: Stage IA (pT1c, pN1a, cM0, G2, ER+, PR+, HER2-) - Signed by Truitt Merle, MD on 10/20/2020   10/04/2020 Surgery   RIGHT BREAST REDUCTION LUMPECTOMY WITH RADIOACTIVE SEED AND SENTINEL LYMPH NODE BIOPSY and BILATERAL MAMMARY REDUCTION  (BREAST) by Dr Marlou Starks and Dr Marla Roe    10/04/2020 Pathology Results   FINAL MICROSCOPIC DIAGNOSIS:   A. LYMPH NODE, RIGHT AXILLARY SENTINEL, BIOPSY:  -  No carcinoma  identified in one lymph node (0/1)   B. LYMPH NODE, RIGHT AXILLARY SENTINEL, BIOPSY  -  Metastatic carcinoma involving one lymph node (1/1)  -  No extracapsular extension identified   C. LYMPH NODE, RIGHT AXILLARY SENTINEL #2, BIOPSY:  -  No carcinoma identified in one lymph node (0/1)   D. BREAST, RIGHT, LUMPECTOMY:  -  Invasive ductal carcinoma, Nottingham grade 2 of 3, 1.2 cm  -  Ductal carcinoma in-situ, intermediate grade  -  Calcifications associated with carcinoma  -  Margins uninvolved by carcinoma (<0.1 cm; medial)  -  Lymphovascular space invasion present  -  Previous biopsy site changes present  -  See oncology table and comment below   E. BREAST, RIGHT DEEP MARGIN, EXCISION:  -  Residual invasive ductal carcinoma  -  Margin uninvolved by carcinoma (0.1 cm; posterior)   F. BREAST, RIGHT SUPERIOR MARGIN, EXCISION:  -  Benign adenosis with calcifications  -  No residual carcinoma identified   G. BREAST, RIGHT INFERIOR LATERAL MARGIN, EXCISION:  -  Focal lobular neoplasia, adenosis and fibrocystic changes with  calcifications  -  No residual carcinoma identified   H. SKIN, RIGHT BREAST, EXCISION:  -  Benign skin and subcutaneous tissue with chronic inflammation  -  No malignancy identified   I. BREAST, RIGHT SUPERIOR NIPPLE, BIOPSY:  -  Benign breast tissue  -  No malignancy identified   J. BREAST, RIGHT LATERAL, EXCISION:  -  Adenosis and fibrocystic changes with calcifications  -  No residual carcinoma identified   K. BREAST, LEFT, MAMMOPLASTY:  -  Adenosis, columnar cell and fibrocystic changes with calcifications  -  No malignancy identified    12/14/2020 - 02/17/2021 Chemotherapy          Genetic Testing   Negative genetic testing. No pathogenic variants identified on the [facility] CancerNext-Expanded panel. The report date is 01/10/2021.   The CancerNext-Expanded gene panel offered by [facility] and includes sequencing and rearrangement analysis for the  following 77 genes: IP, ALK, APC*, ATM*, AXIN2, BAP1, BARD1, BLM, BMPR1A, BRCA1*, BRCA2*, BRIP1*, CDC73, CDH1*,CDK4, CDKN1B, CDKN2A, CHEK2*, CTNNA1, DICER1, FANCC, FH, FLCN, GALNT12, KIF1B, LZTR1, MAX, MEN1, MET, MLH1*, MSH2*, MSH3, MSH6*, MUTYH*, NBN, NF1*, NF2, NTHL1, PALB2*, PHOX2B, PMS2*, POT1, PRKAR1A, PTCH1, PTEN*, RAD51C*, RAD51D*,RB1, RECQL, RET, SDHA, SDHAF2, SDHB, SDHC, SDHD, SMAD4, SMARCA4, SMARCB1, SMARCE1, STK11, SUFU, TMEM127, TP53*,TSC1, TSC2, VHL and XRCC2 (sequencing and deletion/duplication); EGFR, EGLN1, HOXB13, KIT, MITF, PDGFRA, POLD1 and POLE (sequencing only); EPCAM and GREM1 (deletion/duplication only).      CHIEF COMPLIANT: Follow-up estrogen receptor positive breast cancer on tamoxifen to establish oncology care with Dr. Lindi Adie   INTERVAL HISTORY: CORBIN HOTT is a 61 y.o with the above mention estrogen receptor positive breast cancer. She presents to the clinic today for a follow-up. She states that she has moderate hot flashes. States that it is on a day to day basis. State that she takes gabapentin at bed time for comfort. Denies mood swings and muscle cramps. States that she has lymphoedema but she wears compression bras. Denies pain in breast.   ALLERGIES:  is allergic to prednisone, sulfa antibiotics, sulfasalazine, and sulfonamide derivatives.  MEDICATIONS:  Current Outpatient Medications  Medication Sig Dispense Refill   thyroid (NP THYROID) 60 MG tablet Take 2.5 tablets (150 mg total) by mouth daily before breakfast.     ALPRAZolam (XANAX) 0.25 MG tablet Take 0.25 mg by mouth 3 (three) times daily as needed for anxiety.     aspirin EC 81 MG tablet Take 81 mg by mouth daily.     buPROPion (WELLBUTRIN XL) 150 MG 24 hr tablet Take 300 mg by mouth daily.      Cholecalciferol (VITAMIN D-3) 5000 UNITS TABS Take 1 tablet by mouth daily.     ezetimibe (ZETIA) 10 MG tablet TAKE 1 TABLET BY MOUTH EVERY DAY 90 tablet 1   FLUoxetine (PROZAC) 20 MG capsule Take 20 mg by  mouth daily.      gabapentin (NEURONTIN) 300 MG capsule Take 1 capsule (300 mg total) by mouth at bedtime. 90 capsule 4   levocetirizine (XYZAL) 5 MG tablet Take 1 tablet at bedtime by mouth.  2   liothyronine (CYTOMEL) 5 MCG tablet Take 10 mcg by mouth daily.     magnesium gluconate (MAGONATE) 500 MG tablet Take 1,000 mg by mouth at bedtime.     metFORMIN (GLUCOPHAGE-XR) 500 MG 24 hr tablet Take 500 mg by mouth 2 (two) times daily.     montelukast (SINGULAIR) 10 MG tablet Take 10 mg by mouth every morning.     nitroGLYCERIN (NITROSTAT) 0.4 MG SL tablet Place 1 tablet (0.4 mg total) under the tongue every 5 (five) minutes as needed for chest pain. For maximum of 3 doses. 25 tablet 3   rosuvastatin (CRESTOR) 5 MG tablet Take 5 mg by mouth daily.     tamoxifen (NOLVADEX) 20 MG tablet Take 1 tablet (20 mg total) by mouth daily. 90 tablet 3   valACYclovir (VALTREX) 500 MG tablet Take 500 mg by mouth daily as needed.     vitamin C (ASCORBIC ACID) 500 MG tablet Take 500 mg by mouth  daily.     zinc gluconate 50 MG tablet Take 50 mg by mouth daily.     zolpidem (AMBIEN CR) 12.5 MG CR tablet Take 12.5 mg by mouth at bedtime as needed for sleep.     No current facility-administered medications for this visit.    PHYSICAL EXAMINATION: ECOG PERFORMANCE STATUS: 1 - Symptomatic but completely ambulatory  Vitals:   04/25/22 1415  BP: (!) 147/73  Pulse: 87  Resp: 18  Temp: (!) 97.5 F (36.4 C)  SpO2: 97%   Filed Weights   04/25/22 1415  Weight: 174 lb 4.8 oz (79.1 kg)    BREAST: No palpable masses or nodules in either right or left breasts. No palpable axillary supraclavicular or infraclavicular adenopathy no breast tenderness or nipple discharge. (exam performed in the presence of a chaperone)  LABORATORY DATA:  I have reviewed the data as listed    Latest Ref Rng & Units 10/18/2021    4:32 PM 05/03/2021   11:34 AM 02/15/2021   10:49 AM  CMP  Glucose 70 - 99 mg/dL 103  169  180   BUN 8 -  23 mg/dL 16  16  19    Creatinine 0.44 - 1.00 mg/dL 0.56  0.74  0.69   Sodium 135 - 145 mmol/L 139  140  137   Potassium 3.5 - 5.1 mmol/L 4.4  4.4  4.0   Chloride 98 - 111 mmol/L 106  105  104   CO2 22 - 32 mmol/L 24  24  20    Calcium 8.9 - 10.3 mg/dL 9.4  9.4  9.5   Total Protein 6.5 - 8.1 g/dL  6.7  6.9   Total Bilirubin 0.3 - 1.2 mg/dL  0.4  0.3   Alkaline Phos 38 - 126 U/L  93  99   AST 15 - 41 U/L  20  19   ALT 0 - 44 U/L  32  39     Lab Results  Component Value Date   WBC 4.7 05/03/2021   HGB 12.6 05/03/2021   HCT 39.3 05/03/2021   MCV 94.7 05/03/2021   PLT 264 05/03/2021   NEUTROABS 3.4 05/03/2021    ASSESSMENT & PLAN:  Malignant neoplasm of upper-outer quadrant of right breast in female, estrogen receptor positive ([place]) 10/04/20:  right lumpectomy and sentinel lymph node sampling pT1c pN1, stage IB   invasive ductal carcinoma, grade 2, estrogen and progesterone receptor positive, HER2 not amplified, with an MIB-1 of 30%, 1/3 SLN Pos 12/14/20-02/15/21: Adj chemo with TC q 3 weeks X 4 Mammaprint: High risk  Current treatment: tamoxifen started 06/06/2021 Tamoxifen Toxicities: 1.  Moderate hot flashes: Currently takes gabapentin at bedtime   Breast Cancer Surveillance: 1. Breast exam 04/25/22: Normal 2. Mammogram 07/13/21 No abnormalities. Postsurgical changes. Breast Density Category B.     RTC in 1 year    No orders of the defined types were placed in this encounter.  The patient has a good understanding of the overall plan. she agrees with it. she will call with any problems that may develop before the next visit here. Total time spent: 30 mins including face to face time and time spent for planning, charting and co-ordination of care   Harriette Ohara, MD 04/25/22    I Gardiner Coins am scribing for Dr. Lindi Adie  .vgascr

## 2022-04-25 ENCOUNTER — Inpatient Hospital Stay: Payer: 59 | Attending: Hematology and Oncology | Admitting: Hematology and Oncology

## 2022-04-25 ENCOUNTER — Other Ambulatory Visit: Payer: Self-pay

## 2022-04-25 DIAGNOSIS — C50411 Malignant neoplasm of upper-outer quadrant of right female breast: Secondary | ICD-10-CM | POA: Diagnosis present

## 2022-04-25 DIAGNOSIS — Z170 Estrogen receptor positive status [ER+]: Secondary | ICD-10-CM | POA: Diagnosis not present

## 2022-04-25 DIAGNOSIS — Z79810 Long term (current) use of selective estrogen receptor modulators (SERMs): Secondary | ICD-10-CM | POA: Diagnosis not present

## 2022-04-25 DIAGNOSIS — Z79899 Other long term (current) drug therapy: Secondary | ICD-10-CM | POA: Diagnosis not present

## 2022-04-25 DIAGNOSIS — N951 Menopausal and female climacteric states: Secondary | ICD-10-CM | POA: Insufficient documentation

## 2022-04-25 MED ORDER — GABAPENTIN 300 MG PO CAPS: 300.0000 mg | ORAL_CAPSULE | Freq: Every day | ORAL | 4 refills | Status: DC

## 2022-04-25 MED ORDER — TAMOXIFEN CITRATE 20 MG PO TABS: 20.0000 mg | ORAL_TABLET | Freq: Every day | ORAL | 3 refills | Status: DC

## 2022-04-25 MED ORDER — THYROID 60 MG PO TABS: 150.0000 mg | ORAL_TABLET | Freq: Every day | ORAL | Status: AC

## 2022-04-25 NOTE — Assessment & Plan Note (Addendum)
10/04/20:  right lumpectomy and sentinel lymph node sampling pT1c pN1, stage IB   invasive ductal carcinoma, grade 2, estrogen and progesterone receptor positive, HER2 not amplified, with an MIB-1 of 30%, 1/3 SLN Pos 12/14/20-02/15/21: Adj chemo with TC q 3 weeks X 4 Mammaprint: High risk  Current treatment: tamoxifen started 06/06/2021 Tamoxifen Toxicities: 1.  Moderate hot flashes: Currently takes gabapentin at bedtime   Breast Cancer Surveillance: 1. Breast exam 04/25/22: Normal 2. Mammogram 07/13/21 No abnormalities. Postsurgical changes. Breast Density Category B.     RTC in 1 year

## 2022-05-10 ENCOUNTER — Encounter: Payer: Self-pay | Admitting: Internal Medicine

## 2022-05-10 DIAGNOSIS — Z79899 Other long term (current) drug therapy: Secondary | ICD-10-CM

## 2022-05-10 DIAGNOSIS — E1169 Type 2 diabetes mellitus with other specified complication: Secondary | ICD-10-CM

## 2022-05-15 NOTE — Addendum Note (Signed)
Addended by: Darlyne Russian on: 05/15/2022 01:15 PM   Modules accepted: Orders

## 2022-05-19 ENCOUNTER — Encounter: Payer: Self-pay | Admitting: Hematology

## 2022-06-07 ENCOUNTER — Ambulatory Visit
Admission: RE | Admit: 2022-06-07 | Discharge: 2022-06-07 | Disposition: A | Discharge status: Home / Self Care | Payer: 59 | Source: Ambulatory Visit | Attending: Radiation Oncology | Admitting: Radiation Oncology

## 2022-06-07 VITALS — BP 140/80 | HR 98 | Temp 97.0°F | Ht 62.75 in | Wt 174.4 lb

## 2022-06-07 DIAGNOSIS — Z170 Estrogen receptor positive status [ER+]: Secondary | ICD-10-CM | POA: Diagnosis not present

## 2022-06-07 DIAGNOSIS — Z923 Personal history of irradiation: Secondary | ICD-10-CM | POA: Diagnosis not present

## 2022-06-07 DIAGNOSIS — C50411 Malignant neoplasm of upper-outer quadrant of right female breast: Secondary | ICD-10-CM | POA: Insufficient documentation

## 2022-06-07 DIAGNOSIS — R232 Flushing: Secondary | ICD-10-CM | POA: Diagnosis not present

## 2022-06-07 DIAGNOSIS — Z79810 Long term (current) use of selective estrogen receptor modulators (SERMs): Secondary | ICD-10-CM | POA: Diagnosis not present

## 2022-06-07 NOTE — Progress Notes (Signed)
Radiation Oncology Follow up Note  Name: Cassandra Cox   Date:   06/07/2022 MRN:  [id number] DOB: [birth year]    This 61 y.o. female presents to the clinic today for 1 year follow-up status post whole breast radiation to her right breast for stage T1N1 ER/PR positive invasive mammary carcinoma status post wide local excision and adjuvant chemotherapy.  REFERRING PROVIDER: No ref. provider found  HPI: Patient is a 61 year old female now at 1 year having completed whole breast radiation to her right breast for stage T1N1 ER/PR positive invasive mammary carcinoma status post wide local excision.  Seen today in routine follow-up she is doing well.  She specifically denies breast tenderness cough or bone pain..  She had mammograms back in September which I have reviewed BI-RADS 2 benign.  She is currently on tamoxifen she is having some hot flashes and I have suggested vitamin E supplements for that.  COMPLICATIONS OF TREATMENT: none  FOLLOW UP COMPLIANCE: keeps appointments   PHYSICAL EXAM:  BP (!) 140/80   Pulse 98   Temp (!) 97 F (36.1 C)   Ht 5' 2.75" (1.594 m)   Wt 174 lb 6.4 oz (79.1 kg)   BMI 31.14 kg/m  Lungs are clear to A&P cardiac examination essentially unremarkable with regular rate and rhythm. No dominant mass or nodularity is noted in either breast in 2 positions examined. Incision is well-healed. No axillary or supraclavicular adenopathy is appreciated. Cosmetic result is excellent.  Well-developed well-nourished patient in NAD. HEENT reveals PERLA, EOMI, discs not visualized.  Oral cavity is clear. No oral mucosal lesions are identified. Neck is clear without evidence of cervical or supraclavicular adenopathy. Lungs are clear to A&P. Cardiac examination is essentially unremarkable with regular rate and rhythm without murmur rub or thrill. Abdomen is benign with no organomegaly or masses noted. Motor sensory and DTR levels are equal and symmetric in the upper and lower  extremities. Cranial nerves II through XII are grossly intact. Proprioception is intact. No peripheral adenopathy or edema is identified. No motor or sensory levels are noted. Crude visual fields are within normal range.  RADIOLOGY RESULTS: Mammogram results reviewed  PLAN: Present time patient is doing well 1 year out with no evidence of disease.  And pleased with her overall progress.  She is already scheduled for follow-up mammograms in September.  She continues on tamoxifen and will try vitamin E supplements.  I will see her back in 1 year for follow-up.  Patient knows to call with any concerns.  I would like to take this opportunity to thank you for allowing me to participate in the care of your patient.Noreene Filbert, MD

## 2022-06-19 ENCOUNTER — Ambulatory Visit: Payer: 59 | Attending: Adult Health

## 2022-06-19 VITALS — Wt 175.1 lb

## 2022-06-19 DIAGNOSIS — Z483 Aftercare following surgery for neoplasm: Secondary | ICD-10-CM | POA: Insufficient documentation

## 2022-06-19 NOTE — Therapy (Signed)
OUTPATIENT PHYSICAL THERAPY SOZO SCREENING NOTE   Patient Name: Cassandra Cox MRN: [id number] DOB:February 26, 1961, 61 y.o., female Today's Date: 06/19/2022  PCP: Merryl Hacker, No REFERRING PROVIDER: Deanne Coffer*   PT End of Session - 06/19/22 1620     Visit Number 7   # unchanged due to screen only   PT Start Time 1618    PT Stop Time 1622    PT Time Calculation (min) 4 min    Activity Tolerance Patient tolerated treatment well    Behavior During Therapy [facility] for tasks assessed/performed             Past Medical History:  Diagnosis Date   Anxiety    Complication of anesthesia    Coronary artery disease    a. 11/2016 Cath/PCI: LM nl, LAD 30ost, 47m(3.0x18 Resolute Integrity DES--3.75), LCX nl, RCA nl; b. MV 11/18: negative for ischemia, EF 72%, low risk   Depression    Diabetes mellitus without complication ([place]    type 2   Diastolic dysfunction    a. 11/2016 Echo: EF 55-60%, Gr2 DD, LAE, nl RV size/fxn.   Family history of breast cancer    History of kidney stones    Hyperlipemia \\[id number]\  Hypothyroidism    Kidney stone 08/2016   Mitral valve prolapse    MVP (mitral valve prolapse)    Neuromuscular disorder (HCC)    bilateral feet   PONV (postoperative nausea and vomiting)    STEMI (ST elevation myocardial infarction) ([place] 12/02/2016   Vitamin B 12 deficiency    Past Surgical History:  Procedure Laterality Date   ABDOMINAL HYSTERECTOMY  2003   TAH due to adenomyosis   BREAST LUMPECTOMY WITH RADIOACTIVE SEED AND SENTINEL LYMPH NODE BIOPSY Right 10/04/2020   Procedure: RIGHT BREAST REDUCTION LUMPECTOMY WITH RADIOACTIVE SEED AND SENTINEL LYMPH NODE BIOPSY;  Surgeon: TJovita Kussmaul MD;  [place]  Service: General;  Laterality: Right;   BREAST REDUCTION SURGERY  1990   BREAST REDUCTION SURGERY Bilateral 10/04/2020   Procedure: BILATERAL MAMMARY REDUCTION  (BREAST);  Surgeon: DWallace Going DO;  [place]  Service: Plastics;  Laterality: Bilateral;   CARDIAC CATHETERIZATION N/A 12/02/2016   Procedure: Coronary Stent Intervention;  Surgeon: CNelva Bush MD;  [place] [place];  Service: Cardiovascular;  Laterality: N/A;   CARDIAC CATHETERIZATION N/A 12/02/2016   Procedure: Left Heart Cath and Coronary Angiography;  Surgeon: CNelva Bush MD;  [place] [facility];  Service: Cardiovascular;  Laterality: N/A;   CARDIAC CATHETERIZATION N/A 12/02/2016   Procedure: Intravascular Ultrasound/IVUS;  Surgeon: CNelva Bush MD;  [place] [facility];  Service: Cardiovascular;  Laterality: N/A;   CORONARY STENT INTERVENTION     MOUTH SURGERY     Dental implant   PORT-A-CATH REMOVAL Left 10/20/2021   Procedure: REMOVAL PORT-A-CATH;  Surgeon: TJovita Kussmaul MD;  [place]  Service: General;  Laterality: Left;   PORTACATH PLACEMENT Left 12/13/2020   Procedure: INSERTION PORT-A-CATH LEFT SUBCLAVIAN;  Surgeon: TJovita Kussmaul MD;  [place]  Service: General;  Laterality: Left;   Patient Active Problem List   Diagnosis Date Noted   Hyperlipidemia associated with type 2 diabetes mellitus ([place] 05/05/2021   Genetic testing 01/11/2021   Port-A-Cath in place 12/14/2020   Family history of breast cancer    Malignant neoplasm of upper-outer quadrant of right breast in female, estrogen receptor positive ([place] 08/13/2020  Shortness of breath 02/19/2020   Essential hypertension 09/09/2018   Influenza B 10/18/2017   Unstable angina (HCC) 09/14/2017   Coronary artery disease involving native coronary artery of native heart without angina pectoris 07/18/2017   Insomnia 08/25/2015   Hot flashes, menopausal 08/25/2015   Well woman exam 07/22/2015   Family history of diabetes mellitus 07/15/2014   Vitamin D deficiency 11/03/2009   Hyperlipidemia LDL goal <70 11/03/2009   ANXIETY 11/03/2009   Depression 11/03/2009     REFERRING DIAG: right breast cancer at risk for lymphedema  THERAPY DIAG: Aftercare following surgery for neoplasm  PERTINENT HISTORY: Patient was diagnosed on 06/30/2020 with right grade III invasive ductal carcinoma breast cancer. Patient underwent a right lumpectomy and sentinel node biopsy (1/3 nodes positive) on 10/04/2020 with a bilateral reduction. It is ER/PR positive and HER2 negative with a Ki67 of 30%.  PRECAUTIONS: right UE Lymphedema risk, None  SUBJECTIVE: Pt returns for her 3 month L-Dex screen. "I just got back from visiting my daughter in [place] to see my new grandbaby so I got off my routine again. I can tell my breast is a little swollen but I was able to get it back down last time when I resumed use of my Flexitouch, wear of my compression bra and daily self MLD. "  PAIN:  Are you having pain? No  SOZO SCREENING: Patient was assessed today using the SOZO machine to determine the lymphedema index score. This was compared to her baseline score. It was determined that she is within the recommended range when compared to her baseline and no further action is needed at this time. She will continue SOZO screenings. These are done every 3 months for 2 years post operatively followed by every 6 months for 2 years, and then annually. Encouraged pt again to get back into routine of daily Flexitouch followed by self MLD to area of fibrosis and daily wear of compression bra. Pt verbalized good understanding and reports she has good results when she is able to be consistent.    [name], PTA 06/19/2022, 4:22 PM

## 2022-07-13 ENCOUNTER — Encounter: Payer: Self-pay | Admitting: Internal Medicine

## 2022-07-13 ENCOUNTER — Other Ambulatory Visit: Payer: Self-pay | Admitting: Internal Medicine

## 2022-07-13 DIAGNOSIS — E1169 Type 2 diabetes mellitus with other specified complication: Secondary | ICD-10-CM

## 2022-07-13 DIAGNOSIS — Z79899 Other long term (current) drug therapy: Secondary | ICD-10-CM

## 2022-07-18 ENCOUNTER — Encounter: Payer: Self-pay | Admitting: Hematology and Oncology

## 2022-07-19 ENCOUNTER — Ambulatory Visit: Payer: 59 | Admitting: Internal Medicine

## 2022-07-21 ENCOUNTER — Encounter: Payer: Self-pay | Admitting: Hematology

## 2022-07-21 ENCOUNTER — Encounter: Payer: Self-pay | Admitting: Internal Medicine

## 2022-07-21 ENCOUNTER — Ambulatory Visit: Payer: Commercial Managed Care - HMO | Attending: Internal Medicine | Admitting: Internal Medicine

## 2022-07-21 VITALS — BP 124/72 | HR 101 | Ht 63.0 in | Wt 176.0 lb

## 2022-07-21 DIAGNOSIS — E1169 Type 2 diabetes mellitus with other specified complication: Secondary | ICD-10-CM | POA: Diagnosis not present

## 2022-07-21 DIAGNOSIS — E785 Hyperlipidemia, unspecified: Secondary | ICD-10-CM

## 2022-07-21 DIAGNOSIS — I2510 Atherosclerotic heart disease of native coronary artery without angina pectoris: Secondary | ICD-10-CM

## 2022-07-21 MED ORDER — ROSUVASTATIN CALCIUM 10 MG PO TABS: 10.0000 mg | ORAL_TABLET | Freq: Every day | ORAL | 2 refills | Status: DC

## 2022-07-21 NOTE — Patient Instructions (Signed)
Medication Instructions:  Your physician has recommended you make the following change in your medication:   INCREASE Rosuvastatin 10 mg daily. An Rx has been sent to your pharmacy.  *If you need a refill on your cardiac medications before your next appointment, please call your pharmacy*   Lab Work: Your physician recommends that you return for a FASTING lipid profile and alt: in 3 months  Please take the labslips with you to your local [facility]   If you have labs (blood work) drawn today and your tests are completely normal, you will receive your results only by: [place] (if you have MyChart) OR A paper copy in the mail If you have any lab test that is abnormal or we need to change your treatment, we will call you to review the results.   Testing/Procedures: None ordered   Follow-Up: At [facility], you and your health needs are our priority.  As part of our continuing mission to provide you with exceptional heart care, we have created designated Provider Care Teams.  These Care Teams include your primary Cardiologist (physician) and Advanced Practice Providers (APPs -  Physician Assistants and Nurse Practitioners) who all work together to provide you with the care you need, when you need it.  We recommend signing up for the patient portal called "MyChart".  Sign up information is provided on this After Visit Summary.  MyChart is used to connect with patients for Virtual Visits (Telemedicine).  Patients are able to view lab/test results, encounter notes, upcoming appointments, etc.  Non-urgent messages can be sent to your provider as well.   To learn more about what you can do with MyChart, go to [email or web address].    Your next appointment:   6 month(s)  The format for your next appointment:   In Person  Provider:   You may see Nelva Bush, MD or one of the following Advanced Practice Providers on your designated Care Team:   Murray Hodgkins,  NP Christell Faith, PA-C Cadence Kathlen Mody, PA-C Gerrie Nordmann, NP   Other Instructions N/A  Important Information About Sugar

## 2022-07-21 NOTE — Progress Notes (Unsigned)
Follow-up Outpatient Visit Date: 07/21/2022  Primary Care Provider: Pcp, No No address on file  Chief Complaint: ***  HPI:  Cassandra Cox is a 61 y.o. female with history of coronary artery disease status post anterior STEMI (11/2016), hyperlipidemia, type 2 diabetes mellitus, breast cancer, and anxiety, who presents for follow-up of coronary artery disease.  I last saw her in March, at which time she was doing fairly well.  She was concerned that some achiness in her body was due to atorvastatin.  As her symptoms improved with a statin holiday, her PCP started her on low-dose rosuvastatin.  Lipid panel earlier this month was notable for LDL of 84.  I recommended increasing rosuvastatin to 10 mg daily.  No chest pain or shortness of breath.  No LE edema.  Stable palpitations at times.  Notices most when still at night.  No LH.  Walks and does yard [employer].  Cassandra Cox  --------------------------------------------------------------------------------------------------  Cardiovascular History & Procedures: Cardiovascular Problems: Coronary artery disease status post anterior STEMI (11/2016)   Risk Factors: Known CAD, hyperlipidemia, and type 2 diabetes mellitus   Cath/PCI: LHC/PCI (12/02/16): LMCA normal. LAD 30% ostial and 99% mid vessel disease. LCx and RCA without significant disease. Successful IVUS-guided PCI to mid LAD with placement of Integrity Resolute 3.0 x 18 mm drug-eluting stent postdilated with 3.75 mm  balloon.   CV Surgery: None   EP Procedures and Devices: None   Non-Invasive Evaluation(s): Limited TTE (03/29/2021): Normal LV size and wall thickness.  LVEF 55-60% with grade 1 diastolic dysfunction.  Normal RV size and function.  Normal PA pressure.  No significant valvular abnormality. TTE (06/23/2020): Normal LV size and wall thickness.  LVEF 60-65% with grade 2 diastolic dysfunction.  Normal RV size and function.  No significant valvular abnormality. TTE (09/14/2017):  Normal LV size.  LVEF 55-60% with normal wall motion.  Grade 2 diastolic dysfunction with elevated filling pressure.  Normal RV size and function.  No significant valvular abnormality. Pharmacologic MPI (09/14/2017): Normal study without ischemia or scar.  LVEF 72%. TTE (12/03/16): Normal LV size with mild LVH. LVEF 55-60% with grade 2 diastolic dysfunction. Left atrial enlargement. Normal RV size and function. No significant valvular abnormalities.  Recent CV Pertinent Labs: Lab Results  Component Value Date   CHOL 153 01/27/2021   HDL 63 01/27/2021   LDLCALC 76 01/27/2021   LDLDIRECT 142.2 12/04/2013   TRIG 71 01/27/2021   CHOLHDL 2.5 03/03/2020   CHOLHDL 2.3 09/14/2017   INR 1.03 09/14/2017   K 4.4 10/18/2021   MG 1.7 12/04/2016   BUN 16 10/18/2021   BUN 18 03/03/2020   CREATININE 0.56 10/18/2021   CREATININE 0.74 05/03/2021    Past medical and surgical history were reviewed and updated in EPIC.  No outpatient medications have been marked as taking for the 07/21/22 encounter (Appointment) with Bladen Umar, Harrell Gave, MD.    Allergies: Prednisone, Sulfa antibiotics, Sulfasalazine, and Sulfonamide derivatives  Social History   Tobacco Use   Smoking status: Never   Smokeless tobacco: Never  Vaping Use   Vaping Use: Never used  Substance Use Topics   Alcohol use: No   Drug use: No    Family History  Problem Relation Age of Onset   Heart disease Mother    Diabetes Mother    Stroke Mother    Heart attack Mother    Atrial fibrillation Mother    Hypertension Mother    Hyperlipidemia Mother    COPD Father  Hypertension Father    Hyperlipidemia Father    Breast cancer Maternal Aunt 89       x2 age 78   Diabetes Maternal Grandfather     Review of Systems: A 12-system review of systems was performed and was negative except as noted in the HPI.  --------------------------------------------------------------------------------------------------  Physical Exam: There were  no vitals taken for this visit.  General:  NAD. Neck: No JVD or HJR. Lungs: Clear to auscultation bilaterally without wheezes or crackles. Heart: Regular rate and rhythm without murmurs, rubs, or gallops. Abdomen: Soft, nontender, nondistended. Extremities: No lower extremity edema.  EKG:  ***  Lab Results  Component Value Date   WBC 4.7 05/03/2021   HGB 12.6 05/03/2021   HCT 39.3 05/03/2021   MCV 94.7 05/03/2021   PLT 264 05/03/2021    Lab Results  Component Value Date   NA 139 10/18/2021   K 4.4 10/18/2021   CL 106 10/18/2021   CO2 24 10/18/2021   BUN 16 10/18/2021   CREATININE 0.56 10/18/2021   GLUCOSE 103 (H) 10/18/2021   ALT 32 05/03/2021    Lab Results  Component Value Date   CHOL 153 01/27/2021   HDL 63 01/27/2021   LDLCALC 76 01/27/2021   LDLDIRECT 142.2 12/04/2013   TRIG 71 01/27/2021   CHOLHDL 2.5 03/03/2020    --------------------------------------------------------------------------------------------------  ASSESSMENT AND PLAN: Harrell Gave Rudi Bunyard, MD 07/21/2022 2:12 PM

## 2022-07-22 ENCOUNTER — Other Ambulatory Visit: Payer: Self-pay | Admitting: Internal Medicine

## 2022-07-22 ENCOUNTER — Encounter: Payer: Self-pay | Admitting: Internal Medicine

## 2022-07-23 ENCOUNTER — Encounter: Payer: Self-pay | Admitting: Hematology

## 2022-09-13 ENCOUNTER — Telehealth: Payer: Self-pay | Admitting: Internal Medicine

## 2022-09-13 NOTE — Telephone Encounter (Signed)
Pt states [facility] faxed over a clearance for her surgery on Tuesday 09/19/22. Informed her I did not see anything yet but will check with preop to be sure.

## 2022-09-13 NOTE — Telephone Encounter (Signed)
     Primary Cardiologist: Nelva Bush, MD  Chart reviewed as part of pre-operative protocol coverage. Given past medical history and time since last visit, based on ACC/AHA guidelines, Cassandra Cox would be at acceptable risk for the planned procedure without further cardiovascular testing.   Her RCRI is a class III risk, 6.6% risk of major cardiac event.  If necessary her aspirin may be held for 5-7 days prior to her surgery.  Please resume as soon as hemostasis is achieved.  I will route this recommendation to the requesting party via Epic fax function and remove from pre-op pool.  Please call with questions.  Jossie Ng. Seung Nidiffer NP-C     09/13/2022, 4:08 PM [facility] [address] Office [phone] Fax [phone]

## 2022-09-13 NOTE — Telephone Encounter (Signed)
I s/w the pt and said I do not see a clearance in the chart yet. I told her I will take care of this. I asked who was doing her surgery. Pt states Dr. Wylene Simmer with [facility].   I called surgeon office and got the information.      Pre-operative Risk Assessment    Patient Name: Cassandra Cox  DOB: 1961/03/05 MRN: [id number]      Request for Surgical Clearance    Procedure:   ORIF URGENT 09/19/22  Date of Surgery:  Clearance 09/19/22                                 Surgeon:  DR. Jenny Reichmann HEWITT Surgeon's Group or Practice Name:  [facility] Phone number:  [phone] ATTN: [place] Fax number:  [phone]   Type of Clearance Requested:   - Medical ; ASA    Type of Anesthesia:  General    Additional requests/questions:    Jiles Prows   09/13/2022, 3:59 PM

## 2022-09-13 NOTE — Telephone Encounter (Signed)
   Pre-operative Risk Assessment    Patient Name: Cassandra Cox  DOB: [birth year] MRN: [id number]      Request for Surgical Clearance    Procedure:  ORIF L Ankle bimal fracture, possible syndesmosis repair  Date of Surgery:  Clearance 09/19/22                                 Surgeon:  Dr. Wylene Simmer Surgeon's Group or Practice Name:  [facility] Phone number:  [phone] Fax number:  [phone]   Type of Clearance Requested:   - Medical    Type of Anesthesia:  Not Indicated   Additional requests/questions:    SignedMaxwell Caul   09/13/2022, 4:38 PM

## 2022-09-18 ENCOUNTER — Ambulatory Visit: Payer: 59

## 2022-10-10 LAB — LIPID PANEL
Chol/HDL Ratio: 2.4 ratio (ref 0.0–4.4)
Cholesterol, Total: 159 mg/dL (ref 100–199)
HDL: 67 mg/dL (ref >39)
LDL Chol Calc (NIH): 72 mg/dL (ref 0–99)
Triglycerides: 116 mg/dL (ref 0–149)
VLDL Cholesterol Cal: 20 mg/dL (ref 5–40)

## 2022-10-10 LAB — ALT: ALT: 17 IU/L (ref 0–32)

## 2022-11-09 ENCOUNTER — Other Ambulatory Visit: Payer: Self-pay | Admitting: Physician Assistant

## 2022-11-09 DIAGNOSIS — E039 Hypothyroidism, unspecified: Secondary | ICD-10-CM

## 2022-11-20 ENCOUNTER — Ambulatory Visit
Admission: RE | Admit: 2022-11-20 | Discharge: 2022-11-20 | Disposition: A | Discharge status: Home / Self Care | Payer: Commercial Managed Care - HMO | Source: Ambulatory Visit | Attending: Physician Assistant | Admitting: Physician Assistant

## 2022-11-20 DIAGNOSIS — E039 Hypothyroidism, unspecified: Secondary | ICD-10-CM | POA: Diagnosis present

## 2022-12-07 ENCOUNTER — Encounter: Payer: Self-pay | Admitting: Hematology and Oncology

## 2023-01-03 ENCOUNTER — Other Ambulatory Visit: Payer: Self-pay | Admitting: Internal Medicine

## 2023-01-03 NOTE — Telephone Encounter (Signed)
Please schedule 6 month F/U appt for 90 day refill. Thank you!

## 2023-01-03 NOTE — Telephone Encounter (Signed)
LVM to schedule appt

## 2023-01-24 ENCOUNTER — Other Ambulatory Visit: Payer: Self-pay | Admitting: Internal Medicine

## 2023-02-20 ENCOUNTER — Other Ambulatory Visit: Payer: Self-pay | Admitting: Internal Medicine

## 2023-03-01 ENCOUNTER — Ambulatory Visit: Payer: Commercial Managed Care - HMO | Admitting: Internal Medicine

## 2023-03-14 ENCOUNTER — Other Ambulatory Visit: Payer: Self-pay | Admitting: Hematology and Oncology

## 2023-03-16 ENCOUNTER — Ambulatory Visit: Payer: Commercial Managed Care - HMO | Admitting: Physician Assistant

## 2023-03-30 ENCOUNTER — Ambulatory Visit: Payer: Commercial Managed Care - HMO | Attending: Internal Medicine | Admitting: Physician Assistant

## 2023-03-30 ENCOUNTER — Encounter: Payer: Self-pay | Admitting: Physician Assistant

## 2023-03-30 VITALS — BP 134/68 | HR 94 | Ht 63.0 in | Wt 166.0 lb

## 2023-03-30 DIAGNOSIS — I2510 Atherosclerotic heart disease of native coronary artery without angina pectoris: Secondary | ICD-10-CM

## 2023-03-30 DIAGNOSIS — E785 Hyperlipidemia, unspecified: Secondary | ICD-10-CM

## 2023-03-30 DIAGNOSIS — I471 Supraventricular tachycardia, unspecified: Secondary | ICD-10-CM

## 2023-03-30 DIAGNOSIS — Z79899 Other long term (current) drug therapy: Secondary | ICD-10-CM

## 2023-03-30 NOTE — Patient Instructions (Signed)
Medication Instructions:  Your Physician recommend you continue on your current medication as directed.    *If you need a refill on your cardiac medications before your next appointment, please call your pharmacy*   Lab Work: Your physician recommends that you return for lab work (ALT, Lipid)  If you have labs (blood work) drawn today and your tests are completely normal, you will receive your results only by: MyChart Message (if you have MyChart) OR A paper copy in the mail If you have any lab test that is abnormal or we need to change your treatment, we will call you to review the results.   Testing/Procedures: None ordered today   Follow-Up: At [facility], you and your health needs are our priority.  As part of our continuing mission to provide you with exceptional heart care, we have created designated Provider Care Teams.  These Care Teams include your primary Cardiologist (physician) and Advanced Practice Providers (APPs -  Physician Assistants and Nurse Practitioners) who all work together to provide you with the care you need, when you need it.  We recommend signing up for the patient portal called "MyChart".  Sign up information is provided on this After Visit Summary.  MyChart is used to connect with patients for Virtual Visits (Telemedicine).  Patients are able to view lab/test results, encounter notes, upcoming appointments, etc.  Non-urgent messages can be sent to your provider as well.   To learn more about what you can do with MyChart, go to [email or web address].    Your next appointment:   6 month(s)  Provider:   You may see Yvonne Kendall, MD or one of the following Advanced Practice Providers on your designated Care Team:   Nicolasa Ducking, NP Eula Listen, PA-C Cadence Fransico Michael, PA-C Charlsie Quest, NP

## 2023-03-30 NOTE — Progress Notes (Signed)
Cardiology Office Note    Date:  03/30/2023   ID:  Sherylyn, Blick [birth year], MRN [id number]  PCP:  Jennelle Human, PA  Cardiologist:  Yvonne Kendall, MD  Electrophysiologist:  None   Chief Complaint: Follow-up  History of Present Illness:   Cassandra Cox is a 62 y.o. female with history of CAD with anterior ST elevation MI in 11/2016 status post PCI/DES to the LAD, pSVT, right breast cancer diagnosed in 2021 status post lumpectomy and adjunctive chemotherapy, HLD, and anxiety who presents for follow-up of CAD and PSVT.   She was admitted to the hospital in 11/2016 with anterior ST elevation MI.  Emergent LHC showed ostial LAD 30% stenosis, mid LAD 99% stenosis, LCx and RCA without significant disease.  She underwent successful IVUS guided PCI to the mid LAD.  Echo at that time showed an EF of 55 to 60%, grade 2 diastolic dysfunction, left atrial enlargement, normal RV systolic function and ventricular cavity size, and no significant valvular abnormalities.  She was admitted to the hospital in 09/2017 with chest pain and ruled out.  Lexiscan MPI at that time was negative for significant ischemia or scar with an LVEF of 72%.  Echo showed an EF of 55 to 60%, normal wall motion, grade 2 diastolic dysfunction with elevated filling pressure, normal RV systolic function and ventricular cavity size, and no significant valvular abnormalities.  She was seen in the office in 02/2020 noting a little more dyspnea with activity and at times she was inadvertently holding her breath.  She attributed some of her symptoms to deconditioning and being less active over the preceding several months.  She also noted occasional brief palpitations.  In the setting of intolerance to several beta-blockers in the past, pharmacotherapy was deferred at that time.  She was seen in the office in 05/2020 and was doing reasonably well, continuing to note some exertional dyspnea, which was largely unchanged when compared to  her prior visit as well as some palpitations.  Subsequent echo showed an EF of 60-65%, no RWMA, Gr2DD, normal RVSF and ventricular cavity size, and mild tricuspid regurgitation. Overall, this study was largely unchanged from prior. Zio patch showed a predominant rhythm of sinus with an average heart rate of 87 bpm (range 64-146 bpm in sinus), rare PACs/PVCs, single 5-beat atrial run with a maximum rate of 164 bpm.  There were no sustained arrhythmias or patient triggered events.  Most recent echo from 03/2021 demonstrated an EF of 55 to 60%, no regional wall motion abnormalities, grade 1 diastolic dysfunction, normal RV systolic function, ventricular cavity size, and PASP, trivial mitral regurgitation, and an estimated right atrial pressure of 3 mmHg.  She was last seen in the office in 07/2022 and was without symptoms of angina or cardiac decompensation.  She did note occasional brief self-limited palpitations that were changed from prior visits.  Rosuvastatin was titrated to 10 mg daily.  She comes in doing well from a cardiac perspective and is without symptoms of angina or cardiac decompensation.  She does continue to note stable randomly occurring palpitations that are unchanged from prior visits and self-limited.  She is tolerating cardiac medications without issues, including titrated dose of rosuvastatin.  No dizziness, presyncope, or syncope.  Overall, feels well and does not have any acute concerns at this time.   Labs independently reviewed: 10/2022 - ALT normal, TC 159, TG 160, HDL 67, LDL 72 04/2021 - potassium 4.4, BUN 16, serum creatinine 0.74, Hgb 12.6,  PLT 264  Past Medical History:  Diagnosis Date   Anxiety    Complication of anesthesia    Coronary artery disease    a. 11/2016 Cath/PCI: LM nl, LAD 30ost, 46m (3.0x18 Resolute Integrity DES--3.75), LCX nl, RCA nl; b. MV 11/18: negative for ischemia, EF 72%, low risk   Depression    Diabetes mellitus without complication (HCC)    type 2    Diastolic dysfunction    a. 11/2016 Echo: EF 55-60%, Gr2 DD, LAE, nl RV size/fxn.   Family history of breast cancer    History of kidney stones    Hyperlipemia \\[id number] \   Hypothyroidism    Kidney stone 08/2016   Mitral valve prolapse    MVP (mitral valve prolapse)    Neuromuscular disorder (HCC)    bilateral feet   PONV (postoperative nausea and vomiting)    STEMI (ST elevation myocardial infarction) (HCC) 12/02/2016   Vitamin B 12 deficiency     Past Surgical History:  Procedure Laterality Date   ABDOMINAL HYSTERECTOMY  2003   TAH due to adenomyosis   BREAST LUMPECTOMY WITH RADIOACTIVE SEED AND SENTINEL LYMPH NODE BIOPSY Right 10/04/2020   Procedure: RIGHT BREAST REDUCTION LUMPECTOMY WITH RADIOACTIVE SEED AND SENTINEL LYMPH NODE BIOPSY;  Surgeon: Griselda Miner, MD;  Location: [facility];  Service: General;  Laterality: Right;   BREAST REDUCTION SURGERY  1990   BREAST REDUCTION SURGERY Bilateral 10/04/2020   Procedure: BILATERAL MAMMARY REDUCTION  (BREAST);  Surgeon: Peggye Form, DO;  Location: [facility];  Service: Plastics;  Laterality: Bilateral;   CARDIAC CATHETERIZATION N/A 12/02/2016   Procedure: Coronary Stent Intervention;  Surgeon: Yvonne Kendall, MD;  Location: [facility];  Service: Cardiovascular;  Laterality: N/A;   CARDIAC CATHETERIZATION N/A 12/02/2016   Procedure: Left Heart Cath and Coronary Angiography;  Surgeon: Yvonne Kendall, MD;  Location: [facility];  Service: Cardiovascular;  Laterality: N/A;   CARDIAC CATHETERIZATION N/A 12/02/2016   Procedure: Intravascular Ultrasound/IVUS;  Surgeon: Yvonne Kendall, MD;  Location: [facility];  Service: Cardiovascular;  Laterality: N/A;   CORONARY STENT INTERVENTION     MOUTH SURGERY     Dental implant   PORT-A-CATH REMOVAL Left 10/20/2021   Procedure: REMOVAL PORT-A-CATH;  Surgeon: Griselda Miner, MD;  Location: [facility];  Service:  General;  Laterality: Left;   PORTACATH PLACEMENT Left 12/13/2020   Procedure: INSERTION PORT-A-CATH LEFT SUBCLAVIAN;  Surgeon: Griselda Miner, MD;  Location: [facility];  Service: General;  Laterality: Left;    Current Medications: Current Meds  Medication Sig   ALPRAZolam (XANAX) 0.25 MG tablet Take 0.25 mg by mouth 3 (three) times daily as needed for anxiety.   aspirin EC 81 MG tablet Take 81 mg by mouth daily.   buPROPion (WELLBUTRIN XL) 150 MG 24 hr tablet Take 300 mg by mouth daily.    Cholecalciferol (VITAMIN D-3) 5000 UNITS TABS Take 1 tablet by mouth daily.   ezetimibe (ZETIA) 10 MG tablet TAKE 1 TABLET BY MOUTH EVERY DAY   FLUoxetine (PROZAC) 20 MG capsule Take 20 mg by mouth daily.    gabapentin (NEURONTIN) 300 MG capsule Take 1 capsule (300 mg total) by mouth at bedtime.   levocetirizine (XYZAL) 5 MG tablet Take 1 tablet at bedtime by mouth.   liothyronine (CYTOMEL) 5 MCG tablet Take 10 mcg by mouth daily.   magnesium gluconate (MAGONATE) 500 MG tablet Take 1,000 mg by mouth at bedtime.  metFORMIN (GLUCOPHAGE-XR) 500 MG 24 hr tablet Take 500 mg by mouth 2 (two) times daily.   montelukast (SINGULAIR) 10 MG tablet Take 10 mg by mouth every morning.   nitroGLYCERIN (NITROSTAT) 0.4 MG SL tablet Place 1 tablet (0.4 mg total) under the tongue every 5 (five) minutes as needed for chest pain. For maximum of 3 doses.   rosuvastatin (CRESTOR) 10 MG tablet Take 1 tablet (10 mg total) by mouth daily.   tamoxifen (NOLVADEX) 20 MG tablet Take 1 tablet (20 mg total) by mouth daily.   thyroid (NP THYROID) 60 MG tablet Take 2.5 tablets (150 mg total) by mouth daily before breakfast.   valACYclovir (VALTREX) 500 MG tablet Take 500 mg by mouth daily as needed.   vitamin C (ASCORBIC ACID) 500 MG tablet Take 500 mg by mouth daily.   zinc gluconate 50 MG tablet Take 50 mg by mouth daily.   zolpidem (AMBIEN CR) 12.5 MG CR tablet Take 12.5 mg by mouth at bedtime as needed for sleep.     Allergies:   Prednisone, Sulfa antibiotics, Sulfasalazine, and Sulfonamide derivatives   Social History   Socioeconomic History   Marital status: Divorced    Spouse name: Not on file   Number of children: Not on file   Years of education: Not on file   Highest education level: Not on file  Occupational History    Employer: [employer]  Tobacco Use   Smoking status: Never   Smokeless tobacco: Never  Vaping Use   Vaping Use: Never used  Substance and Sexual Activity   Alcohol use: No   Drug use: No   Sexual activity: Yes    Birth control/protection: Surgical  Other Topics Concern   Not on file  Social History Narrative   Not on file   Social Determinants of Health   Financial Resource Strain: Not on file  Food Insecurity: Not on file  Transportation Needs: Not on file  Physical Activity: Not on file  Stress: Not on file  Social Connections: Not on file     Family History:  The patient's family history includes Atrial fibrillation in her mother; Breast cancer (age of onset: 59) in her maternal aunt; COPD in her father; Diabetes in her maternal grandfather and mother; Heart attack in her mother; Heart disease in her mother; Hyperlipidemia in her father and mother; Hypertension in her father and mother; Stroke in her mother.  ROS:   12-point review of systems is negative unless otherwise noted in the HPI.   EKGs/Labs/Other Studies Reviewed:    Studies reviewed were summarized above. The additional studies were reviewed today:  Limited echo 03/29/2021: 1. Left ventricular ejection fraction, by estimation, is 55 to 60%. The  left ventricle has normal function. The left ventricle has no regional  wall motion abnormalities. Left ventricular diastolic parameters are  consistent with Grade I diastolic  dysfunction (impaired relaxation).   2. Right ventricular systolic function is normal. The right ventricular  size is normal. There is normal pulmonary  artery systolic pressure.   3. The mitral valve is normal in structure. Trivial mitral valve  regurgitation. No evidence of mitral stenosis.   4. The aortic valve is tricuspid. Aortic valve regurgitation is not  visualized. No aortic stenosis is present.   5. The inferior vena cava is normal in size with greater than 50%  respiratory variability, suggesting right atrial pressure of 3 mmHg.   Comparison(s): EF 60-65%, GLS not reported.  __________  2D echo 06/2020: 1. Left ventricular ejection fraction, by estimation, is 60 to 65%. The  left ventricle has normal function. The left ventricle has no regional  wall motion abnormalities. Left ventricular diastolic parameters are  consistent with Grade II diastolic  dysfunction (pseudonormalization).   2. Right ventricular systolic function is normal. The right ventricular  size is normal. Tricuspid regurgitation signal is inadequate for assessing  PA pressure.  __________   [facility] 05/2020: The patient was monitored for 8 days, 5 hours. The predominant rhythm was sinus with an average rate of 87 bpm (range 64-146 bpm in sinus). There were rare PAC's and PVC's. A single 5 beat atrial run occurred with a maximum rate of 164 bpm. No sustained arrhythmia or prolonged pause was observerd. There were no patient triggered events.   Predominantly sinus rhythm with rare PAC's and PVC's, as well as a single brief atrial run.  No significant arrhythmia observed. __________   Eugenie Birks MPI 09/2017: Pharmacological myocardial perfusion imaging study with no significant  ischemia Normal wall motion, EF estimated at 72% No EKG changes concerning for ischemia at peak stress or in recovery. Nonspecific ST abnormality at rest Low risk scan __________   2D echo 09/2017: - Left ventricle: The cavity size was normal. Systolic function was    normal. The estimated ejection fraction was in the range of 55%    to 60%. Wall motion was normal; there were no  regional wall    motion abnormalities. Features are consistent with a pseudonormal    left ventricular filling pattern, with concomitant abnormal    relaxation and increased filling pressure (grade 2 diastolic    dysfunction).  - Left atrium: The atrium was normal in size.  - Right ventricle: Systolic function was normal.  - Pulmonary arteries: Systolic pressure was within the normal    range. __________   2D echo 11/2016: - Left ventricle: There was mild concentric hypertrophy. Systolic    function was normal. The estimated ejection fraction was in the    range of 55% to 60%. Features are consistent with a pseudonormal    left ventricular filling pattern, with concomitant abnormal    relaxation and increased filling pressure (grade 2 diastolic    dysfunction). Doppler parameters are consistent with    indeterminate ventricular filling pressure.  - Aortic valve: Transvalvular velocity was within the normal range.    There was no stenosis. There was no regurgitation. Valve area    (Vmax): 1.85 cm^2.  - Mitral valve: Transvalvular velocity was within the normal range.    There was no evidence for stenosis. There was no regurgitation.  - Left atrium: The atrium was severely dilated.  - Right ventricle: The cavity size was normal. Wall thickness was    normal. Systolic function was normal.  - Tricuspid valve: There was no regurgitation.  - Pulmonary arteries: Systolic pressure was within the normal    range. PA peak pressure: 26 mm Hg (S). __________   [facility] 11/2016: Conclusions: Anterior STEMI with thrombotic occlusion of the mid LAD, likely due to acute plaque rupture. Moderate diffuse atherosclerotic plaquing of the proximal and mid LAD by IVUS. Mildly elevated left ventricular filling pressure. Successful IVUS-guided PCI to the mid LAD with placement of an Integrity Resolute 3.0 x 18 mm drug-eluting stent, post-dilated in the proximal and mid portions with 3.75 mm noncompliant  balloon.   Recommendations: Admit to ICU on hospitalist service. TR band protocol. Continue tirofiban x 4 hours. Dual antiplatelet therapy with  aspirin and ticagrelor for at least 12 months. Aggressive secondary prevention, including high-intensity statin therapy. Echocardiogram to evaluate LV function.   EKG:  EKG is ordered today.  The EKG ordered today demonstrates NSR, 94 bpm, nonspecific ST changes, consistent with prior tracings  Recent Labs: 10/09/2022: ALT 17  Recent Lipid Panel    Component Value Date/Time   CHOL 159 10/09/2022 1147   TRIG 116 10/09/2022 1147   HDL 67 10/09/2022 1147   CHOLHDL 2.4 10/09/2022 1147   CHOLHDL 2.3 09/14/2017 0536   VLDL 9 09/14/2017 0536   LDLCALC 72 10/09/2022 1147   LDLDIRECT 142.2 12/04/2013 1516    PHYSICAL EXAM:    VS:  BP 134/68 (BP Location: Left Arm, Patient Position: Sitting, Cuff Size: Normal)   Pulse 94   Ht 5\' 3"  (1.6 m)   Wt 166 lb (75.3 kg)   SpO2 98%   BMI 29.41 kg/m   BMI: Body mass index is 29.41 kg/m.  Physical Exam Vitals reviewed.  Constitutional:      Appearance: She is well-developed.  HENT:     Head: Normocephalic and atraumatic.  Eyes:     General:        Right eye: No discharge.        Left eye: No discharge.  Neck:     Vascular: No JVD.  Cardiovascular:     Rate and Rhythm: Normal rate and regular rhythm.     Pulses:          Posterior tibial pulses are 2+ on the right side and 2+ on the left side.     Heart sounds: Normal heart sounds, S1 normal and S2 normal. Heart sounds not distant. No midsystolic click and no opening snap. No murmur heard.    No friction rub.  Pulmonary:     Effort: Pulmonary effort is normal. No respiratory distress.     Breath sounds: Normal breath sounds. No decreased breath sounds, wheezing or rales.  Chest:     Chest wall: No tenderness.  Abdominal:     General: There is no distension.  Musculoskeletal:     Cervical back: Normal range of motion.     Right lower  leg: No edema.     Left lower leg: No edema.  Skin:    General: Skin is warm and dry.     Nails: There is no clubbing.  Neurological:     Mental Status: She is alert and oriented to person, place, and time.  Psychiatric:        Speech: Speech normal.        Behavior: Behavior normal.        Thought Content: Thought content normal.        Judgment: Judgment normal.     Wt Readings from Last 3 Encounters:  03/30/23 166 lb (75.3 kg)  07/21/22 176 lb (79.8 kg)  06/19/22 175 lb 2 oz (79.4 kg)     ASSESSMENT & PLAN:   CAD involving the native coronary arteries without angina: She continues to do well and is without symptoms concerning for angina or cardiac decompensation.  EKG is consistent with prior tracings.  Continue aggressive risk factor modification and secondary prevention including aspirin, ezetimibe, and rosuvastatin.  PSVT: Symptoms overall stable.  Not requiring standing AV nodal blocking medication.  If symptoms become more frequent, could consider as needed beta-blocker or nondihydropyridine calcium channel blocker.  HLD: LDL 72 in 10/2022 with normal ALT at that time.  Future orders placed for  fasting lipid panel and ALT.  She remains on rosuvastatin 10 mg.  Target LDL less than 70.   Disposition: F/u with Dr. Okey Dupre or an APP in 6 months.   Medication Adjustments/Labs and Tests Ordered: Current medicines are reviewed at length with the patient today.  Concerns regarding medicines are outlined above. Medication changes, Labs and Tests ordered today are summarized above and listed in the Patient Instructions accessible in Encounters.   Signed, Eula Listen, PA-C 03/30/2023 4:10 PM     [facility] [place] [address] [place], [place] [address] [phone]

## 2023-04-04 LAB — LIPID PANEL
Chol/HDL Ratio: 2.2 ratio (ref 0.0–4.4)
Cholesterol, Total: 135 mg/dL (ref 100–199)
HDL: 62 mg/dL (ref >39)
LDL Chol Calc (NIH): 54 mg/dL (ref 0–99)
Triglycerides: 107 mg/dL (ref 0–149)
VLDL Cholesterol Cal: 19 mg/dL (ref 5–40)

## 2023-04-04 LAB — ALT: ALT: 22 IU/L (ref 0–32)

## 2023-04-18 NOTE — Progress Notes (Signed)
Patient Care Team: Jennelle Human, [place] as PCP - General (Physician Assistant) End, Cristal Deer, MD as PCP - Cardiology (Cardiology) Griselda Miner, MD as Consulting Physician (General Surgery) Dorothy Puffer, MD as Consulting Physician (Radiation Oncology)  DIAGNOSIS:  Encounter Diagnoses  Name Primary?   Postmenopausal Yes   Malignant neoplasm of upper-outer quadrant of right breast in female, estrogen receptor positive (HCC)     SUMMARY OF ONCOLOGIC HISTORY: Oncology History Overview Note  Cancer Staging Malignant neoplasm of upper-outer quadrant of right breast in female, estrogen receptor positive (HCC) Staging form: Breast, AJCC 8th Edition - Clinical stage from 08/18/2020: Stage IA (cT1c, cN0, cM0, G3, ER+, PR+, HER2-) - Unsigned    Malignant neoplasm of upper-outer quadrant of right breast in female, estrogen receptor positive (HCC)  07/20/2020 Mammogram   IMPRESSION The 1.1 cm irregular mass in the uper outer right breast, 10-11:00 position, 7cm form the nipple is indeterminate.    08/09/2020 Initial Biopsy   Diagnosis 08/09/20 Breast, right, needle core biopsy -INVASIVE MAMMARY CARCINOMA -MAMMARY CARCINOMA IN SITU -CALCIFICATIONS -SEE COMMENT   -Grade 3  E-cadherin is POSITIVE supporting a ductal origin   08/09/2020 Receptors her2   ER 95% PR - 90% Ki67 - 30% HER2 NEGATIVE   08/13/2020 Initial Diagnosis   Malignant neoplasm of upper-outer quadrant of right breast in female, estrogen receptor positive (HCC)   10/04/2020 Cancer Staging   Staging form: Breast, AJCC 8th Edition - Pathologic stage from 10/04/2020: Stage IA (pT1c, pN1a, cM0, G2, ER+, PR+, HER2-) - Signed by Malachy Mood, MD on 10/20/2020   10/04/2020 Surgery   RIGHT BREAST REDUCTION LUMPECTOMY WITH RADIOACTIVE SEED AND SENTINEL LYMPH NODE BIOPSY and BILATERAL MAMMARY REDUCTION  (BREAST) by Dr Carolynne Edouard and Dr Ulice Bold    10/04/2020 Pathology Results   FINAL MICROSCOPIC DIAGNOSIS:   A. LYMPH NODE,  RIGHT AXILLARY SENTINEL, BIOPSY:  -  No carcinoma identified in one lymph node (0/1)   B. LYMPH NODE, RIGHT AXILLARY SENTINEL, BIOPSY  -  Metastatic carcinoma involving one lymph node (1/1)  -  No extracapsular extension identified   C. LYMPH NODE, RIGHT AXILLARY SENTINEL #2, BIOPSY:  -  No carcinoma identified in one lymph node (0/1)   D. BREAST, RIGHT, LUMPECTOMY:  -  Invasive ductal carcinoma, Nottingham grade 2 of 3, 1.2 cm  -  Ductal carcinoma in-situ, intermediate grade  -  Calcifications associated with carcinoma  -  Margins uninvolved by carcinoma (<0.1 cm; medial)  -  Lymphovascular space invasion present  -  Previous biopsy site changes present  -  See oncology table and comment below   E. BREAST, RIGHT DEEP MARGIN, EXCISION:  -  Residual invasive ductal carcinoma  -  Margin uninvolved by carcinoma (0.1 cm; posterior)   F. BREAST, RIGHT SUPERIOR MARGIN, EXCISION:  -  Benign adenosis with calcifications  -  No residual carcinoma identified   G. BREAST, RIGHT INFERIOR LATERAL MARGIN, EXCISION:  -  Focal lobular neoplasia, adenosis and fibrocystic changes with  calcifications  -  No residual carcinoma identified   H. SKIN, RIGHT BREAST, EXCISION:  -  Benign skin and subcutaneous tissue with chronic inflammation  -  No malignancy identified   I. BREAST, RIGHT SUPERIOR NIPPLE, BIOPSY:  -  Benign breast tissue  -  No malignancy identified   J. BREAST, RIGHT LATERAL, EXCISION:  -  Adenosis and fibrocystic changes with calcifications  -  No residual carcinoma identified   K. BREAST, LEFT, MAMMOPLASTY:  -  Adenosis,  columnar cell and fibrocystic changes with calcifications  -  No malignancy identified    12/14/2020 - 02/17/2021 Chemotherapy          Genetic Testing   Negative genetic testing. No pathogenic variants identified on the [facility] CancerNext-Expanded panel. The report date is 01/10/2021.   The CancerNext-Expanded gene panel offered by [facility] and  includes sequencing and rearrangement analysis for the following 77 genes: IP, ALK, APC*, ATM*, AXIN2, BAP1, BARD1, BLM, BMPR1A, BRCA1*, BRCA2*, BRIP1*, CDC73, CDH1*,CDK4, CDKN1B, CDKN2A, CHEK2*, CTNNA1, DICER1, FANCC, FH, FLCN, GALNT12, KIF1B, LZTR1, MAX, MEN1, MET, MLH1*, MSH2*, MSH3, MSH6*, MUTYH*, NBN, NF1*, NF2, NTHL1, PALB2*, PHOX2B, PMS2*, POT1, PRKAR1A, PTCH1, PTEN*, RAD51C*, RAD51D*,RB1, RECQL, RET, SDHA, SDHAF2, SDHB, SDHC, SDHD, SMAD4, SMARCA4, SMARCB1, SMARCE1, STK11, SUFU, TMEM127, TP53*,TSC1, TSC2, VHL and XRCC2 (sequencing and deletion/duplication); EGFR, EGLN1, HOXB13, KIT, MITF, PDGFRA, POLD1 and POLE (sequencing only); EPCAM and GREM1 (deletion/duplication only).      CHIEF COMPLIANT: Follow-up tamoxifen   INTERVAL HISTORY: Cassandra Cox is a 62 y.o with the above mention estrogen receptor positive breast cancer. Follow-up estrogen receptor positive breast cancer on tamoxifen. She presents to the clinic for a follow-up. She reports that the hot flashes has getting worst. She denies any other symptoms or side effects. She complains of tenderness and swelling under right axilla.   ALLERGIES:  is allergic to prednisone, sulfa antibiotics, sulfasalazine, and sulfonamide derivatives.  MEDICATIONS:  Current Outpatient Medications  Medication Sig Dispense Refill   ALPRAZolam (XANAX) 0.25 MG tablet Take 0.25 mg by mouth 3 (three) times daily as needed for anxiety.     anastrozole (ARIMIDEX) 1 MG tablet Take 1 tablet (1 mg total) by mouth daily. 90 tablet 3   aspirin EC 81 MG tablet Take 81 mg by mouth daily.     buPROPion (WELLBUTRIN XL) 150 MG 24 hr tablet Take 300 mg by mouth daily.      Cholecalciferol (VITAMIN D-3) 5000 UNITS TABS Take 1 tablet by mouth daily.     ezetimibe (ZETIA) 10 MG tablet TAKE 1 TABLET BY MOUTH EVERY DAY 90 tablet 0   FLUoxetine (PROZAC) 20 MG capsule Take 20 mg by mouth daily.      gabapentin (NEURONTIN) 300 MG capsule Take 1 capsule (300 mg total) by  mouth at bedtime. 90 capsule 4   levocetirizine (XYZAL) 5 MG tablet Take 1 tablet at bedtime by mouth.  2   liothyronine (CYTOMEL) 5 MCG tablet Take 10 mcg by mouth daily.     magnesium gluconate (MAGONATE) 500 MG tablet Take 1,000 mg by mouth at bedtime.     metFORMIN (GLUCOPHAGE-XR) 500 MG 24 hr tablet Take 500 mg by mouth 2 (two) times daily.     montelukast (SINGULAIR) 10 MG tablet Take 10 mg by mouth every morning.     nitroGLYCERIN (NITROSTAT) 0.4 MG SL tablet Place 1 tablet (0.4 mg total) under the tongue every 5 (five) minutes as needed for chest pain. For maximum of 3 doses. 25 tablet 3   rosuvastatin (CRESTOR) 10 MG tablet TAKE 1 TABLET BY MOUTH DAILY 90 tablet 1   scopolamine (TRANSDERM-SCOP) 1 MG/3DAYS Place 1 patch onto the skin daily.     thyroid (NP THYROID) 60 MG tablet Take 2.5 tablets (150 mg total) by mouth daily before breakfast.     valACYclovir (VALTREX) 500 MG tablet Take 500 mg by mouth daily as needed.     vitamin C (ASCORBIC ACID) 500 MG tablet Take 500 mg by mouth daily.  zinc gluconate 50 MG tablet Take 50 mg by mouth daily.     zolpidem (AMBIEN CR) 12.5 MG CR tablet Take 12.5 mg by mouth at bedtime as needed for sleep.     No current facility-administered medications for this visit.    PHYSICAL EXAMINATION: ECOG PERFORMANCE STATUS: 1 - Symptomatic but completely ambulatory  Vitals:   04/26/23 1536  BP: 134/83  Pulse: 92  Resp: 18  Temp: (!) 97.3 F (36.3 C)  SpO2: 98%   Filed Weights   04/26/23 1536  Weight: 163 lb 11.2 oz (74.3 kg)    BREAST: Significant swelling in the right axillary fold with tenderness.  No palpable lumps or nodules.. (exam performed in the presence of a chaperone)  LABORATORY DATA:  I have reviewed the data as listed    Latest Ref Rng & Units 04/03/2023   11:10 AM 10/09/2022   11:47 AM 10/18/2021    4:32 PM  CMP  Glucose 70 - 99 mg/dL   253   BUN 8 - 23 mg/dL   16   Creatinine 6.64 - 1.00 mg/dL   4.03   Sodium 474 -  145 mmol/L   139   Potassium 3.5 - 5.1 mmol/L   4.4   Chloride 98 - 111 mmol/L   106   CO2 22 - 32 mmol/L   24   Calcium 8.9 - 10.3 mg/dL   9.4   ALT 0 - 32 IU/L 22  17      Lab Results  Component Value Date   WBC 4.7 05/03/2021   HGB 12.6 05/03/2021   HCT 39.3 05/03/2021   MCV 94.7 05/03/2021   PLT 264 05/03/2021   NEUTROABS 3.4 05/03/2021    ASSESSMENT & PLAN:  Malignant neoplasm of upper-outer quadrant of right breast in female, estrogen receptor positive (HCC) 10/04/20:  right lumpectomy and sentinel lymph node sampling pT1c pN1, stage IB   invasive ductal carcinoma, grade 2, estrogen and progesterone receptor positive, HER2 not amplified, with an MIB-1 of 30%, 1/3 SLN Pos 12/14/20-02/15/21: Adj chemo with TC q 3 weeks X 4 Mammaprint: High risk   Current treatment: tamoxifen started 06/06/2021 switched to anastrozole 04/26/2023 because of hot flashes  Anastrozole counseling: We discussed the risks and benefits of anti-estrogen therapy with aromatase inhibitors. These include but not limited to insomnia, hot flashes, mood changes, vaginal dryness, bone density loss, and weight gain. We strongly believe that the benefits far outweigh the risks. Patient understands these risks and consented to starting treatment. Planned treatment duration is 3 years.   Breast Cancer Surveillance: 1. Breast exam 04/26/23: Normal 2. Mammogram 07/18/2022 no abnormalities. Postsurgical changes. Breast Density Category B.    Bone density will be done at [facility]  Arm swelling in the right axillary area suspicion for lymphedema: I will send her to physical therapy.  Telephone visit in 3 months to discuss tolerance to anastrozole    Orders Placed This Encounter  Procedures   DG Bone Density    Standing Status:   Future    Standing Expiration Date:   04/25/2024    Order Specific Question:   Reason for Exam (SYMPTOM  OR DIAGNOSIS REQUIRED)    Answer:   postmenopausal    Order Specific  Question:   Preferred imaging location?    Answer:   [facility]   Ambulatory referral to Physical Therapy    Referral Priority:   Routine    Referral Type:   Physical Medicine  Referral Reason:   Specialty Services Required    Requested Specialty:   Physical Therapy    Number of Visits Requested:   1   The patient has a good understanding of the overall plan. she agrees with it. she will call with any problems that may develop before the next visit here. Total time spent: 30 mins including face to face time and time spent for planning, charting and co-ordination of care   Tamsen Meek, MD 04/26/23    I Janan Ridge am acting as a Neurosurgeon for [employer]  I have reviewed the above documentation for accuracy and completeness, and I agree with the above.

## 2023-04-24 ENCOUNTER — Other Ambulatory Visit: Payer: Self-pay | Admitting: Internal Medicine

## 2023-04-26 ENCOUNTER — Other Ambulatory Visit: Payer: Self-pay

## 2023-04-26 ENCOUNTER — Inpatient Hospital Stay: Payer: Commercial Managed Care - HMO | Attending: Hematology and Oncology | Admitting: Hematology and Oncology

## 2023-04-26 VITALS — BP 134/83 | HR 92 | Temp 97.3°F | Resp 18 | Ht 63.0 in | Wt 163.7 lb

## 2023-04-26 DIAGNOSIS — Z170 Estrogen receptor positive status [ER+]: Secondary | ICD-10-CM | POA: Insufficient documentation

## 2023-04-26 DIAGNOSIS — N951 Menopausal and female climacteric states: Secondary | ICD-10-CM | POA: Insufficient documentation

## 2023-04-26 DIAGNOSIS — C50411 Malignant neoplasm of upper-outer quadrant of right female breast: Secondary | ICD-10-CM | POA: Diagnosis present

## 2023-04-26 DIAGNOSIS — Z780 Asymptomatic menopausal state: Secondary | ICD-10-CM

## 2023-04-26 DIAGNOSIS — Z79811 Long term (current) use of aromatase inhibitors: Secondary | ICD-10-CM | POA: Insufficient documentation

## 2023-04-26 MED ORDER — ANASTROZOLE 1 MG PO TABS: 1.0000 mg | ORAL_TABLET | Freq: Every day | ORAL | 3 refills | Status: DC

## 2023-04-26 NOTE — Assessment & Plan Note (Addendum)
10/04/20:  right lumpectomy and sentinel lymph node sampling pT1c pN1, stage IB   invasive ductal carcinoma, grade 2, estrogen and progesterone receptor positive, HER2 not amplified, with an MIB-1 of 30%, 1/3 SLN Pos 12/14/20-02/15/21: Adj chemo with TC q 3 weeks X 4 Mammaprint: High risk   Current treatment: tamoxifen started 06/06/2021 switched to anastrozole 04/26/2023 because of hot flashes  Anastrozole counseling: We discussed the risks and benefits of anti-estrogen therapy with aromatase inhibitors. These include but not limited to insomnia, hot flashes, mood changes, vaginal dryness, bone density loss, and weight gain. We strongly believe that the benefits far outweigh the risks. Patient understands these risks and consented to starting treatment. Planned treatment duration is 3 years.   Breast Cancer Surveillance: 1. Breast exam 04/26/23: Normal 2. Mammogram 07/18/2022 no abnormalities. Postsurgical changes. Breast Density Category B.    Bone density will be done at [facility]  Arm swelling in the right axillary area suspicion for lymphedema: I will send her to physical therapy.  Telephone visit in 3 months to discuss tolerance to anastrozole

## 2023-04-30 ENCOUNTER — Telehealth: Payer: Self-pay | Admitting: Hematology and Oncology

## 2023-04-30 NOTE — Telephone Encounter (Signed)
Scheduled appointment per 6/20 los. Patient is aware of the made appointment. 

## 2023-04-30 NOTE — Telephone Encounter (Signed)
Rescheduled appointment per patient via incoming call. Patient is aware of the changes made to her upcoming appointment.

## 2023-05-16 ENCOUNTER — Other Ambulatory Visit: Payer: Self-pay | Admitting: Hematology and Oncology

## 2023-05-26 ENCOUNTER — Other Ambulatory Visit: Payer: Self-pay | Admitting: Hematology and Oncology

## 2023-06-18 ENCOUNTER — Ambulatory Visit: Payer: Commercial Managed Care - HMO | Attending: Radiation Oncology | Admitting: Radiation Oncology

## 2023-06-19 ENCOUNTER — Other Ambulatory Visit: Payer: Self-pay | Admitting: Internal Medicine

## 2023-06-20 MED ORDER — EZETIMIBE 10 MG PO TABS: 10.0000 mg | ORAL_TABLET | Freq: Every day | ORAL | 0 refills | Status: DC

## 2023-06-28 ENCOUNTER — Ambulatory Visit
Admission: RE | Admit: 2023-06-28 | Discharge: 2023-06-28 | Disposition: A | Discharge status: Home / Self Care | Payer: Commercial Managed Care - HMO | Source: Ambulatory Visit | Attending: Hematology and Oncology | Admitting: Hematology and Oncology

## 2023-06-28 DIAGNOSIS — C50411 Malignant neoplasm of upper-outer quadrant of right female breast: Secondary | ICD-10-CM | POA: Insufficient documentation

## 2023-06-28 DIAGNOSIS — Z780 Asymptomatic menopausal state: Secondary | ICD-10-CM | POA: Insufficient documentation

## 2023-06-28 DIAGNOSIS — Z170 Estrogen receptor positive status [ER+]: Secondary | ICD-10-CM | POA: Diagnosis present

## 2023-07-02 ENCOUNTER — Telehealth: Payer: Self-pay

## 2023-07-02 NOTE — Telephone Encounter (Signed)
Called and given below message. She verbalized understanding and appreciated the call.

## 2023-07-02 NOTE — Telephone Encounter (Signed)
-----   Message from Noreene Filbert sent at 06/29/2023 11:11 AM EDT ----- Bone density is consistent with osteopenia with a T-score -1.6.  Please let patient know recommend calcium vitamin D weightbearing exercises. ----- Message ----- From: Interface, Rad Results In Sent: 06/28/2023   4:10 PM EDT To: Serena Croissant, MD

## 2023-07-26 ENCOUNTER — Inpatient Hospital Stay: Payer: Commercial Managed Care - HMO | Attending: Hematology and Oncology | Admitting: Hematology and Oncology

## 2023-07-26 DIAGNOSIS — C50411 Malignant neoplasm of upper-outer quadrant of right female breast: Secondary | ICD-10-CM

## 2023-07-26 DIAGNOSIS — Z170 Estrogen receptor positive status [ER+]: Secondary | ICD-10-CM | POA: Diagnosis not present

## 2023-07-26 MED ORDER — LETROZOLE 2.5 MG PO TABS: 2.5000 mg | ORAL_TABLET | Freq: Every day | ORAL | 3 refills | Status: DC

## 2023-07-26 NOTE — Progress Notes (Signed)
HEMATOLOGY-ONCOLOGY TELEPHONE VISIT PROGRESS NOTE  I connected with our patient on 07/26/23 at  3:00 PM EDT by telephone and verified that I am speaking with the correct person using two identifiers.  I discussed the limitations, risks, security and privacy concerns of performing an evaluation and management service by telephone and the availability of in person appointments.  I also discussed with the patient that there may be a patient responsible charge related to this service. The patient expressed understanding and agreed to proceed.   History of Present Illness: She tolerated anastrozole much better than tamoxifen but she continues to have hot flashes which bother her.  Oncology History Overview Note  Cancer Staging Malignant neoplasm of upper-outer quadrant of right breast in female, estrogen receptor positive (HCC) Staging form: Breast, AJCC 8th Edition - Clinical stage from 08/18/2020: Stage IA (cT1c, cN0, cM0, G3, ER+, PR+, HER2-) - Unsigned    Malignant neoplasm of upper-outer quadrant of right breast in female, estrogen receptor positive (HCC)  07/20/2020 Mammogram   IMPRESSION The 1.1 cm irregular mass in the uper outer right breast, 10-11:00 position, 7cm form the nipple is indeterminate.    08/09/2020 Initial Biopsy   Diagnosis 08/09/20 Breast, right, needle core biopsy -INVASIVE MAMMARY CARCINOMA -MAMMARY CARCINOMA IN SITU -CALCIFICATIONS -SEE COMMENT   -Grade 3  E-cadherin is POSITIVE supporting a ductal origin   08/09/2020 Receptors her2   ER 95% PR - 90% Ki67 - 30% HER2 NEGATIVE   08/13/2020 Initial Diagnosis   Malignant neoplasm of upper-outer quadrant of right breast in female, estrogen receptor positive (HCC)   10/04/2020 Cancer Staging   Staging form: Breast, AJCC 8th Edition - Pathologic stage from 10/04/2020: Stage IA (pT1c, pN1a, cM0, G2, ER+, PR+, HER2-) - Signed by Malachy Mood, MD on 10/20/2020   10/04/2020 Surgery   RIGHT BREAST REDUCTION LUMPECTOMY  WITH RADIOACTIVE SEED AND SENTINEL LYMPH NODE BIOPSY and BILATERAL MAMMARY REDUCTION  (BREAST) by Dr Carolynne Edouard and Dr Ulice Bold    10/04/2020 Pathology Results   FINAL MICROSCOPIC DIAGNOSIS:   A. LYMPH NODE, RIGHT AXILLARY SENTINEL, BIOPSY:  -  No carcinoma identified in one lymph node (0/1)   B. LYMPH NODE, RIGHT AXILLARY SENTINEL, BIOPSY  -  Metastatic carcinoma involving one lymph node (1/1)  -  No extracapsular extension identified   C. LYMPH NODE, RIGHT AXILLARY SENTINEL #2, BIOPSY:  -  No carcinoma identified in one lymph node (0/1)   D. BREAST, RIGHT, LUMPECTOMY:  -  Invasive ductal carcinoma, Nottingham grade 2 of 3, 1.2 cm  -  Ductal carcinoma in-situ, intermediate grade  -  Calcifications associated with carcinoma  -  Margins uninvolved by carcinoma (<0.1 cm; medial)  -  Lymphovascular space invasion present  -  Previous biopsy site changes present  -  See oncology table and comment below   E. BREAST, RIGHT DEEP MARGIN, EXCISION:  -  Residual invasive ductal carcinoma  -  Margin uninvolved by carcinoma (0.1 cm; posterior)   F. BREAST, RIGHT SUPERIOR MARGIN, EXCISION:  -  Benign adenosis with calcifications  -  No residual carcinoma identified   G. BREAST, RIGHT INFERIOR LATERAL MARGIN, EXCISION:  -  Focal lobular neoplasia, adenosis and fibrocystic changes with  calcifications  -  No residual carcinoma identified   H. SKIN, RIGHT BREAST, EXCISION:  -  Benign skin and subcutaneous tissue with chronic inflammation  -  No malignancy identified   I. BREAST, RIGHT SUPERIOR NIPPLE, BIOPSY:  -  Benign breast tissue  -  No  malignancy identified   J. BREAST, RIGHT LATERAL, EXCISION:  -  Adenosis and fibrocystic changes with calcifications  -  No residual carcinoma identified   K. BREAST, LEFT, MAMMOPLASTY:  -  Adenosis, columnar cell and fibrocystic changes with calcifications  -  No malignancy identified    12/14/2020 - 02/17/2021 Chemotherapy          Genetic  Testing   Negative genetic testing. No pathogenic variants identified on the [facility] CancerNext-Expanded panel. The report date is 01/10/2021.   The CancerNext-Expanded gene panel offered by [facility] and includes sequencing and rearrangement analysis for the following 77 genes: IP, ALK, APC*, ATM*, AXIN2, BAP1, BARD1, BLM, BMPR1A, BRCA1*, BRCA2*, BRIP1*, CDC73, CDH1*,CDK4, CDKN1B, CDKN2A, CHEK2*, CTNNA1, DICER1, FANCC, FH, FLCN, GALNT12, KIF1B, LZTR1, MAX, MEN1, MET, MLH1*, MSH2*, MSH3, MSH6*, MUTYH*, NBN, NF1*, NF2, NTHL1, PALB2*, PHOX2B, PMS2*, POT1, PRKAR1A, PTCH1, PTEN*, RAD51C*, RAD51D*,RB1, RECQL, RET, SDHA, SDHAF2, SDHB, SDHC, SDHD, SMAD4, SMARCA4, SMARCB1, SMARCE1, STK11, SUFU, TMEM127, TP53*,TSC1, TSC2, VHL and XRCC2 (sequencing and deletion/duplication); EGFR, EGLN1, HOXB13, KIT, MITF, PDGFRA, POLD1 and POLE (sequencing only); EPCAM and GREM1 (deletion/duplication only).      REVIEW OF SYSTEMS:   Constitutional: Denies fevers, chills or abnormal weight loss All other systems were reviewed with the patient and are negative. Observations/Objective:     Assessment Plan:  Malignant neoplasm of upper-outer quadrant of right breast in female, estrogen receptor positive (HCC) 10/04/20:  right lumpectomy and sentinel lymph node sampling pT1c pN1, stage IB   invasive ductal carcinoma, grade 2, estrogen and progesterone receptor positive, HER2 not amplified, with an MIB-1 of 30%, 1/3 SLN Pos 12/14/20-02/15/21: Adj chemo with TC q 3 weeks X 4 Mammaprint: High risk   Current treatment: tamoxifen started 06/06/2021 switched to anastrozole 04/26/2023 because of hot flashes, switched to Letrozole 07/26/23   Anastrozole toxicities:  Planned treatment duration is 3 years. Hot flashes better than Tamoxifen  Because she is on multiple medications for hot flashes, we decided to switch her to letrozole instead of adding Effexor.  Breast Cancer Surveillance: 1. Breast exam 04/26/23: Normal 2. Mammogram  07/18/2022 no abnormalities. Postsurgical changes. Breast Density Category B.     Bone density will be done at [facility]: T score -1.6 Arm swelling in the right axillary area suspicion for lymphedema: Received physical therapy   Return to clinic in 3 months by Phone visit to assess tolerance to Letrozole  I discussed the assessment and treatment plan with the patient. The patient was provided an opportunity to ask questions and all were answered. The patient agreed with the plan and demonstrated an understanding of the instructions. The patient was advised to call back or seek an in-person evaluation if the symptoms worsen or if the condition fails to improve as anticipated.   I provided 12 minutes of non-face-to-face time during this encounter.  This includes time for charting and coordination of care   Tamsen Meek, MD

## 2023-07-26 NOTE — Assessment & Plan Note (Signed)
10/04/20:  right lumpectomy and sentinel lymph node sampling pT1c pN1, stage IB   invasive ductal carcinoma, grade 2, estrogen and progesterone receptor positive, HER2 not amplified, with an MIB-1 of 30%, 1/3 SLN Pos 12/14/20-02/15/21: Adj chemo with TC q 3 weeks X 4 Mammaprint: High risk   Current treatment: tamoxifen started 06/06/2021 switched to anastrozole 04/26/2023 because of hot flashes   Anastrozole toxicities:  Planned treatment duration is 3 years.   Breast Cancer Surveillance: 1. Breast exam 04/26/23: Normal 2. Mammogram 07/18/2022 no abnormalities. Postsurgical changes. Breast Density Category B.    Bone density will be done at [facility]   Arm swelling in the right axillary area suspicion for lymphedema: Received physical therapy   Return to clinic in 1 year for follow-up

## 2023-07-27 ENCOUNTER — Telehealth: Payer: Commercial Managed Care - HMO | Admitting: Hematology and Oncology

## 2023-08-13 ENCOUNTER — Encounter: Payer: Self-pay | Admitting: Hematology and Oncology

## 2023-08-20 ENCOUNTER — Other Ambulatory Visit: Payer: Self-pay | Admitting: Radiology

## 2023-08-21 LAB — SURGICAL PATHOLOGY

## 2023-08-23 ENCOUNTER — Encounter: Payer: Self-pay | Admitting: Hematology and Oncology

## 2023-08-23 ENCOUNTER — Inpatient Hospital Stay: Payer: Commercial Managed Care - HMO | Admitting: Adult Health

## 2023-08-31 ENCOUNTER — Other Ambulatory Visit: Payer: Self-pay | Admitting: Internal Medicine

## 2023-10-11 ENCOUNTER — Other Ambulatory Visit: Payer: Self-pay | Admitting: Internal Medicine

## 2023-10-17 ENCOUNTER — Encounter: Payer: Self-pay | Admitting: Internal Medicine

## 2023-10-17 ENCOUNTER — Ambulatory Visit: Payer: Commercial Managed Care - HMO | Attending: Internal Medicine | Admitting: Internal Medicine

## 2023-10-17 VITALS — BP 122/62 | HR 96 | Ht 62.0 in | Wt 159.0 lb

## 2023-10-17 DIAGNOSIS — E1169 Type 2 diabetes mellitus with other specified complication: Secondary | ICD-10-CM

## 2023-10-17 DIAGNOSIS — R002 Palpitations: Secondary | ICD-10-CM | POA: Diagnosis not present

## 2023-10-17 DIAGNOSIS — I471 Supraventricular tachycardia, unspecified: Secondary | ICD-10-CM

## 2023-10-17 DIAGNOSIS — E785 Hyperlipidemia, unspecified: Secondary | ICD-10-CM

## 2023-10-17 DIAGNOSIS — I2510 Atherosclerotic heart disease of native coronary artery without angina pectoris: Secondary | ICD-10-CM | POA: Diagnosis not present

## 2023-10-17 MED ORDER — NITROGLYCERIN 0.4 MG SL SUBL: 0.4000 mg | SUBLINGUAL_TABLET | SUBLINGUAL | 3 refills | Status: AC | PRN

## 2023-10-17 MED ORDER — ROSUVASTATIN CALCIUM 10 MG PO TABS: 10.0000 mg | ORAL_TABLET | Freq: Every day | ORAL | 3 refills | Status: DC

## 2023-10-17 MED ORDER — EZETIMIBE 10 MG PO TABS
10.0000 mg | ORAL_TABLET | Freq: Every day | ORAL | 3 refills | Status: AC
Start: 2023-10-17 — End: ?

## 2023-10-17 NOTE — Patient Instructions (Signed)
 Medication Instructions:  Your physician recommends that you continue on your current medications as directed. Please refer to the Current Medication list given to you today.   *If you need a refill on your cardiac medications before your next appointment, please call your pharmacy*   Lab Work: No labs ordered today    Testing/Procedures: No test ordered today    Follow-Up: At [facility], you and your health needs are our priority.  As part of our continuing mission to provide you with exceptional heart care, we have created designated Provider Care Teams.  These Care Teams include your primary Cardiologist (physician) and Advanced Practice Providers (APPs -  Physician Assistants and Nurse Practitioners) who all work together to provide you with the care you need, when you need it.  We recommend signing up for the patient portal called "MyChart".  Sign up information is provided on this After Visit Summary.  MyChart is used to connect with patients for Virtual Visits (Telemedicine).  Patients are able to view lab/test results, encounter notes, upcoming appointments, etc.  Non-urgent messages can be sent to your provider as well.   To learn more about what you can do with MyChart, go to [email or web address].    Your next appointment:   1 year(s)  Provider:   You may see Yvonne Kendall, MD or one of the following Advanced Practice Providers on your designated Care Team:   Nicolasa Ducking, NP Eula Listen, PA-C Cadence Fransico Michael, PA-C Charlsie Quest, NP Carlos Levering, NP

## 2023-10-17 NOTE — Progress Notes (Signed)
  Cardiology Office Note:  .   Date:  10/17/2023  ID:  Cassandra Cox, DOB 09-10-1961, MRN [id number] PCP: Jennelle Human, PA  [facility] Providers Cardiologist:  Yvonne Kendall, MD     History of Present Illness: .   Cassandra Cox is a 62 y.o. female with history of coronary artery disease status post anterior STEMI (11/2016), hyperlipidemia, type 2 diabetes mellitus, breast cancer, and anxiety who presents for follow-up of coronary artery disease.  She was last seen in our office in May by Eula Listen, PA, at which time she was feeling well other than random palpitations similar to prior visits.  No medication changes or additional testing were pursued.  Date, [name] reports that she has been feeling fairly well.  She notes sporadic palpitations, sometimes occurring several days in a row.  She can then go months without any palpitations.  They usually last 10 minutes or less and feel like her heart is racing.  They sometimes have some associated lightheadedness, though she has not passed out or fallen.  She denies chest pain and shortness of breath.  She notes some mild swelling in both ankles where she previously injured them.  She otherwise has not had any edema.  She is tolerating her medications well.  ROS: See HPI  Studies Reviewed: Marland Kitchen   EKG Interpretation Date/Time:  Wednesday October 17 2023 15:32:23 EST Ventricular Rate:  96 PR Interval:  108 QRS Duration:  76 QT Interval:  358 QTC Calculation: 452 R Axis:   18  Text Interpretation: Interpretation limited secondary to artifact Sinus rhythm with short PR Nonspecific ST abnormality When compared with ECG of 30-Mar-2023 No significant change was found Confirmed by Makailyn Mccormick, Cristal Deer [phone]) on 10/17/2023 3:39:53 PM    Risk Assessment/Calculations:             Physical Exam:   VS:  BP 122/62 (BP Location: Left Arm, Patient Position: Sitting, Cuff Size: Normal)   Pulse 96   Ht 5\' 2"  (1.575 m)   Wt 159 lb (72.1 kg)    SpO2 98%   BMI 29.08 kg/m    Wt Readings from Last 3 Encounters:  10/17/23 159 lb (72.1 kg)  04/26/23 163 lb 11.2 oz (74.3 kg)  03/30/23 166 lb (75.3 kg)    General:  NAD. Neck: No JVD or HJR. Lungs: Clear to auscultation bilaterally without wheezes or crackles. Heart: Regular rate and rhythm without murmurs, rubs, or gallops. Abdomen: Soft, nontender, nondistended. Extremities: No lower extremity edema.  ASSESSMENT AND PLAN: .    Coronary artery disease: No angina reported.  Continue current regimen of aspirin, ezetimibe, and rosuvastatin for secondary prevention.  Palpitations and PSVT: Sporadic palpitations reported.  Event monitor in 2021 showed brief episode of PSVT but no significant arrhythmia.  Given stable frequency of events, sometimes not occurring for months at a time, we have agreed to defer medication changes and additional testing.  [name] will reach out to [place] if she has worsening symptoms.  Hyperlipidemia associated with type 2 diabetes mellitus: Lipids very well-controlled on last check this summer.  Continue current regimen of ezetimibe and rosuvastatin; defer escalation of rosuvastatin with history of statin myopathy at higher doses.  Ongoing management of DM per PCP.    Dispo: Return to clinic in 1 year.  Signed, Yvonne Kendall, MD

## 2023-11-12 ENCOUNTER — Other Ambulatory Visit: Payer: Self-pay | Admitting: Internal Medicine

## 2023-11-28 ENCOUNTER — Telehealth: Payer: Self-pay

## 2023-11-28 ENCOUNTER — Other Ambulatory Visit (HOSPITAL_COMMUNITY): Payer: Self-pay

## 2023-11-28 NOTE — Telephone Encounter (Signed)
Pharmacy Patient Advocate Encounter   Received notification from CoverMyMeds that prior authorization for ROSUVASTATIN is required/requested.   Insurance verification completed.   The patient is insured through [facility] .   Per test claim: PA required; PA submitted to above mentioned insurance via CoverMyMeds Key/confirmation #/EOC [id number] Status is pending

## 2023-11-28 NOTE — Telephone Encounter (Signed)
Pharmacy Patient Advocate Encounter   Received notification from CoverMyMeds that prior authorization for ZETIA is required/requested.   Insurance verification completed.   The patient is insured through [facility] .   Per test claim: PA required; PA submitted to above mentioned insurance via CoverMyMeds Key/confirmation #/EOC [id number] Status is pending

## 2023-12-03 ENCOUNTER — Other Ambulatory Visit (HOSPITAL_COMMUNITY): Payer: Self-pay

## 2023-12-03 NOTE — Telephone Encounter (Signed)
Pharmacy Patient Advocate Encounter  Received notification from [facility] that Prior Authorization for ZETIA has been APPROVED from 12/01/23 to 11/30/24

## 2023-12-03 NOTE — Telephone Encounter (Addendum)
Pharmacy Patient Advocate Encounter  Received notification from [facility] that Prior Authorization for ROSUVASTATIN has been APPROVED from 12/01/23 to 11/30/24

## 2024-01-06 ENCOUNTER — Other Ambulatory Visit: Payer: Self-pay | Admitting: Internal Medicine

## 2024-06-05 ENCOUNTER — Other Ambulatory Visit: Payer: Self-pay | Admitting: Hematology and Oncology

## 2024-06-05 ENCOUNTER — Telehealth: Payer: Self-pay | Admitting: Hematology and Oncology

## 2024-06-05 NOTE — Telephone Encounter (Signed)
 Spoke with patient confirming upcoming appointment

## 2024-07-30 ENCOUNTER — Ambulatory Visit: Payer: Self-pay | Admitting: Hematology and Oncology

## 2024-08-03 ENCOUNTER — Other Ambulatory Visit: Payer: Self-pay | Admitting: Hematology and Oncology

## 2024-08-09 ENCOUNTER — Other Ambulatory Visit: Payer: Self-pay | Admitting: Internal Medicine

## 2024-08-12 ENCOUNTER — Inpatient Hospital Stay: Payer: Self-pay | Attending: Hematology and Oncology | Admitting: Hematology and Oncology

## 2024-08-12 DIAGNOSIS — Z170 Estrogen receptor positive status [ER+]: Secondary | ICD-10-CM

## 2024-08-12 DIAGNOSIS — C50411 Malignant neoplasm of upper-outer quadrant of right female breast: Secondary | ICD-10-CM

## 2024-08-12 DIAGNOSIS — I890 Lymphedema, not elsewhere classified: Secondary | ICD-10-CM

## 2024-08-12 MED ORDER — LETROZOLE 2.5 MG PO TABS: 2.5000 mg | ORAL_TABLET | Freq: Every day | ORAL | 3 refills | Status: DC

## 2024-08-12 NOTE — Assessment & Plan Note (Signed)
 10/04/20:  right lumpectomy and sentinel lymph node sampling pT1c pN1, stage IB   invasive ductal carcinoma, grade 2, estrogen and progesterone receptor positive, HER2 not amplified, with an MIB-1 of 30%, 1/3 SLN Pos 12/14/20-02/15/21: Adj chemo with TC q 3 weeks X 4 Mammaprint: High risk   Current treatment: tamoxifen  started 06/06/2021 switched to anastrozole  04/26/2023 because of hot flashes, switched to Letrozole  07/26/23   Letrozole  toxicities:  Planned treatment duration is 3 years. Hot flashes better than Tamoxifen   Because she is on multiple medications for hot flashes, we decided to switch her to letrozole  instead of adding Effexor.   Breast Cancer Surveillance: 1. Breast exam 04/26/23: Normal 2. Mammogram 07/18/2022 no abnormalities. Postsurgical changes. Breast Density Category B.      Bone density will be done at [facility]: T score -1.6 Arm swelling in the right axillary area suspicion for lymphedema: Received physical therapy   Return to clinic in 3 months by Phone visit to assess tolerance to Letrozole 

## 2024-08-12 NOTE — Progress Notes (Signed)
 HEMATOLOGY-ONCOLOGY TELEPHONE VISIT PROGRESS NOTE  I connected with our patient on 08/12/24 at  3:30 PM EDT by telephone and verified that I am speaking with the correct person using two identifiers.  I discussed the limitations, risks, security and privacy concerns of performing an evaluation and management service by telephone and the availability of in person appointments.  I also discussed with the patient that there may be a patient responsible charge related to this service. The patient expressed understanding and agreed to proceed.   History of Present Illness:  History of Present Illness Cassandra Cox is a 63 year old female with estrogen receptor positive breast cancer who presents for follow-up regarding her letrozole  treatment.  She has been on letrozole  for approximately one year and notes improvement in hot flashes, especially since the weather is not hot. Letrozole  is more effective than previous treatments. She does not recall needing any refills for letrozole , but it is noted that she only received one refill initially. [facility] [address]  A mammogram is scheduled for the following day.  She inquires about returning to a facility for lymphedema management, which she had to cancel previously due to a broken ankle.    Oncology History Overview Note  Cancer Staging Malignant neoplasm of upper-outer quadrant of right breast in female, estrogen receptor positive (HCC) Staging form: Breast, AJCC 8th Edition - Clinical stage from 08/18/2020: Stage IA (cT1c, cN0, cM0, G3, ER+, PR+, HER2-) - Unsigned    Malignant neoplasm of upper-outer quadrant of right breast in female, estrogen receptor positive (HCC)  07/20/2020 Mammogram   IMPRESSION The 1.1 cm irregular mass in the uper outer right breast, 10-11:00 position, 7cm form the nipple is indeterminate.    08/09/2020 Initial Biopsy   Diagnosis 08/09/20 Breast, right, needle core  biopsy -INVASIVE MAMMARY CARCINOMA -MAMMARY CARCINOMA IN SITU -CALCIFICATIONS -SEE COMMENT   -Grade 3  E-cadherin is POSITIVE supporting a ductal origin   08/09/2020 Receptors her2   ER 95% PR - 90% Ki67 - 30% HER2 NEGATIVE   08/13/2020 Initial Diagnosis   Malignant neoplasm of upper-outer quadrant of right breast in female, estrogen receptor positive (HCC)   10/04/2020 Cancer Staging   Staging form: Breast, AJCC 8th Edition - Pathologic stage from 10/04/2020: Stage IA (pT1c, pN1a, cM0, G2, ER+, PR+, HER2-) - Signed by Lanny Callander, MD on 10/20/2020   10/04/2020 Surgery   RIGHT BREAST REDUCTION LUMPECTOMY WITH RADIOACTIVE SEED AND SENTINEL LYMPH NODE BIOPSY and BILATERAL MAMMARY REDUCTION  (BREAST) by Dr Curvin and Dr Lowery    10/04/2020 Pathology Results   FINAL MICROSCOPIC DIAGNOSIS:   A. LYMPH NODE, RIGHT AXILLARY SENTINEL, BIOPSY:  -  No carcinoma identified in one lymph node (0/1)   B. LYMPH NODE, RIGHT AXILLARY SENTINEL, BIOPSY  -  Metastatic carcinoma involving one lymph node (1/1)  -  No extracapsular extension identified   C. LYMPH NODE, RIGHT AXILLARY SENTINEL #2, BIOPSY:  -  No carcinoma identified in one lymph node (0/1)   D. BREAST, RIGHT, LUMPECTOMY:  -  Invasive ductal carcinoma, Nottingham grade 2 of 3, 1.2 cm  -  Ductal carcinoma in-situ, intermediate grade  -  Calcifications associated with carcinoma  -  Margins uninvolved by carcinoma (<0.1 cm; medial)  -  Lymphovascular space invasion present  -  Previous biopsy site changes present  -  See oncology table and comment below   E. BREAST, RIGHT DEEP MARGIN, EXCISION:  -  Residual  invasive ductal carcinoma  -  Margin uninvolved by carcinoma (0.1 cm; posterior)   F. BREAST, RIGHT SUPERIOR MARGIN, EXCISION:  -  Benign adenosis with calcifications  -  No residual carcinoma identified   G. BREAST, RIGHT INFERIOR LATERAL MARGIN, EXCISION:  -  Focal lobular neoplasia, adenosis and fibrocystic changes  with  calcifications  -  No residual carcinoma identified   H. SKIN, RIGHT BREAST, EXCISION:  -  Benign skin and subcutaneous tissue with chronic inflammation  -  No malignancy identified   I. BREAST, RIGHT SUPERIOR NIPPLE, BIOPSY:  -  Benign breast tissue  -  No malignancy identified   J. BREAST, RIGHT LATERAL, EXCISION:  -  Adenosis and fibrocystic changes with calcifications  -  No residual carcinoma identified   K. BREAST, LEFT, MAMMOPLASTY:  -  Adenosis, columnar cell and fibrocystic changes with calcifications  -  No malignancy identified    12/14/2020 - 02/17/2021 Chemotherapy          Genetic Testing   Negative genetic testing. No pathogenic variants identified on the [facility] CancerNext-Expanded panel. The report date is 01/10/2021.   The CancerNext-Expanded gene panel offered by [facility] and includes sequencing and rearrangement analysis for the following 77 genes: IP, ALK, APC*, ATM*, AXIN2, BAP1, BARD1, BLM, BMPR1A, BRCA1*, BRCA2*, BRIP1*, CDC73, CDH1*,CDK4, CDKN1B, CDKN2A, CHEK2*, CTNNA1, DICER1, FANCC, FH, FLCN, GALNT12, KIF1B, LZTR1, MAX, MEN1, MET, MLH1*, MSH2*, MSH3, MSH6*, MUTYH*, NBN, NF1*, NF2, NTHL1, PALB2*, PHOX2B, PMS2*, POT1, PRKAR1A, PTCH1, PTEN*, RAD51C*, RAD51D*,RB1, RECQL, RET, SDHA, SDHAF2, SDHB, SDHC, SDHD, SMAD4, SMARCA4, SMARCB1, SMARCE1, STK11, SUFU, TMEM127, TP53*,TSC1, TSC2, VHL and XRCC2 (sequencing and deletion/duplication); EGFR, EGLN1, HOXB13, KIT, MITF, PDGFRA, POLD1 and POLE (sequencing only); EPCAM and GREM1 (deletion/duplication only).      REVIEW OF SYSTEMS:   Constitutional: Denies fevers, chills or abnormal weight loss All other systems were reviewed with the patient and are negative. Observations/Objective:     Assessment Plan:  Malignant neoplasm of upper-outer quadrant of right breast in female, estrogen receptor positive (HCC) 10/04/20:  right lumpectomy and sentinel lymph node sampling pT1c pN1, stage IB   invasive ductal  carcinoma, grade 2, estrogen and progesterone receptor positive, HER2 not amplified, with an MIB-1 of 30%, 1/3 SLN Pos 12/14/20-02/15/21: Adj chemo with TC q 3 weeks X 4 Mammaprint: High risk   Current treatment: tamoxifen  started 06/06/2021 switched to anastrozole  04/26/2023 because of hot flashes, switched to Letrozole  07/26/23   Letrozole  toxicities:  Planned treatment duration is 3 years. ( 2 more years left) Because she is on multiple medications for hot flashes, we decided to switch her to letrozole  instead of adding Effexor.   Breast Cancer Surveillance: 1. Breast exam 04/26/23: Normal 2. Mammogram 07/18/2022 no abnormalities. Postsurgical changes. Breast Density Category B.      Bone density will be done at [facility]: T score -1.6 Arm swelling in the right axillary area suspicion for lymphedema: Received physical therapy   Return to clinic in 1 year for follow-up   Assessment & Plan Estrogen receptor positive malignant neoplasm of upper-outer quadrant of right breast She has been on letrozole  for one year with good efficacy and tolerability. - Continue letrozole  for three years, two years remaining. - [facility] [address] - Schedule follow-up in one year.  Lymphedema She inquired about rescheduling her lymphedema evaluation.(SOZO) - Contact lymphedema facility to reschedule appointment.      I discussed the assessment and treatment plan with the  patient. The patient was provided an opportunity to ask questions and all were answered. The patient agreed with the plan and demonstrated an understanding of the instructions. The patient was advised to call back or seek an in-person evaluation if the symptoms worsen or if the condition fails to improve as anticipated.   I provided 20 minutes of non-face-to-face time during this encounter.  This includes time for charting and coordination of care   Naomi MARLA Chad, MD

## 2024-08-18 ENCOUNTER — Ambulatory Visit: Payer: Self-pay | Attending: General Surgery

## 2024-08-18 VITALS — Wt 166.1 lb

## 2024-08-18 DIAGNOSIS — Z483 Aftercare following surgery for neoplasm: Secondary | ICD-10-CM | POA: Insufficient documentation

## 2024-08-18 NOTE — Therapy (Signed)
 OUTPATIENT PHYSICAL THERAPY SOZO SCREENING NOTE   Patient Name: Cassandra Cox MRN: [id number] DOB:[birth year], 63 y.o., female Today's Date: 08/18/2024  PCP: Geraldine Lamarr HERO, PA REFERRING PROVIDER: Curvin Deward MOULD, MD   PT End of Session - 08/18/24 1538     Visit Number 7   # unchanged due to screen only   PT Start Time 1535    PT Stop Time 1540    PT Time Calculation (min) 5 min    Activity Tolerance Patient tolerated treatment well    Behavior During Therapy [facility] for tasks assessed/performed          Past Medical History:  Diagnosis Date   Anxiety    Complication of anesthesia    Coronary artery disease    a. 11/2016 Cath/PCI: LM nl, LAD 30ost, 37m (3.0x18 Resolute Integrity DES--3.75), LCX nl, RCA nl; b. MV 11/18: negative for ischemia, EF 72%, low risk   Depression    Diabetes mellitus without complication (HCC)    type 2   Diastolic dysfunction    a. 11/2016 Echo: EF 55-60%, Gr2 DD, LAE, nl RV size/fxn.   Family history of breast cancer    History of kidney stones    Hyperlipemia \\[id number] \   Hypothyroidism    Kidney stone 08/2016   Mitral valve prolapse    MVP (mitral valve prolapse)    Neuromuscular disorder (HCC)    bilateral feet   PONV (postoperative nausea and vomiting)    STEMI (ST elevation myocardial infarction) (HCC) 12/02/2016   Vitamin B 12 deficiency    Past Surgical History:  Procedure Laterality Date   ABDOMINAL HYSTERECTOMY  2003   TAH due to adenomyosis   BREAST LUMPECTOMY WITH RADIOACTIVE SEED AND SENTINEL LYMPH NODE BIOPSY Right 10/04/2020   Procedure: RIGHT BREAST REDUCTION LUMPECTOMY WITH RADIOACTIVE SEED AND SENTINEL LYMPH NODE BIOPSY;  Surgeon: Curvin Deward MOULD, MD;  Location: [facility];  Service: General;  Laterality: Right;   BREAST REDUCTION SURGERY  1990   BREAST REDUCTION SURGERY Bilateral 10/04/2020   Procedure: BILATERAL MAMMARY REDUCTION  (BREAST);  Surgeon: Lowery Estefana RAMAN, DO;  Location: [facility];  Service: Plastics;  Laterality: Bilateral;   CARDIAC CATHETERIZATION N/A 12/02/2016   Procedure: Coronary Stent Intervention;  Surgeon: Lonni Hanson, MD;  Location: [facility];  Service: Cardiovascular;  Laterality: N/A;   CARDIAC CATHETERIZATION N/A 12/02/2016   Procedure: Left Heart Cath and Coronary Angiography;  Surgeon: Lonni Hanson, MD;  Location: [facility];  Service: Cardiovascular;  Laterality: N/A;   CARDIAC CATHETERIZATION N/A 12/02/2016   Procedure: Intravascular Ultrasound/IVUS;  Surgeon: Lonni Hanson, MD;  Location: [facility];  Service: Cardiovascular;  Laterality: N/A;   CORONARY STENT INTERVENTION     MOUTH SURGERY     Dental implant   PORT-A-CATH REMOVAL Left 10/20/2021   Procedure: REMOVAL PORT-A-CATH;  Surgeon: Curvin Deward MOULD, MD;  Location: [facility];  Service: General;  Laterality: Left;   PORTACATH PLACEMENT Left 12/13/2020   Procedure: INSERTION PORT-A-CATH LEFT SUBCLAVIAN;  Surgeon: Curvin Deward MOULD, MD;  Location: [facility];  Service: General;  Laterality: Left;   Patient Active Problem List   Diagnosis Date Noted   Palpitations 10/17/2023   PSVT (paroxysmal supraventricular tachycardia) 10/17/2023   Hyperlipidemia associated with type 2 diabetes mellitus (HCC) 05/05/2021   Genetic testing 01/11/2021   Port-A-Cath in place 12/14/2020   Family history of breast cancer    Malignant neoplasm of upper-outer  quadrant of right breast in female, estrogen receptor positive (HCC) 08/13/2020   Shortness of breath 02/19/2020   Essential hypertension 09/09/2018   Influenza B 10/18/2017   Unstable angina (HCC) 09/14/2017   Coronary artery disease involving native coronary artery of native heart without angina pectoris 07/18/2017   Insomnia 08/25/2015   Hot flashes, menopausal 08/25/2015   Well woman exam 07/22/2015   Family history of diabetes mellitus 07/15/2014   Vitamin D  deficiency 11/03/2009    Hyperlipidemia LDL goal <70 11/03/2009   Anxiety state 11/03/2009   Depression 11/03/2009    REFERRING DIAG: right breast cancer at risk for lymphedema  THERAPY DIAG: Aftercare following surgery for neoplasm  PERTINENT HISTORY: Patient was diagnosed on 06/30/2020 with right grade III invasive ductal carcinoma breast cancer. Patient underwent a right lumpectomy and sentinel node biopsy (1/3 nodes positive) on 10/04/2020 with a bilateral reduction. It is ER/PR positive and HER2 negative with a Ki67 of 30%.  PRECAUTIONS: right UE Lymphedema risk, None  SUBJECTIVE: Pt returns for her annual L-Dex screen.   PAIN:  Are you having pain? No  SOZO SCREENING: Patient was assessed today using the SOZO machine to determine the lymphedema index score. This was compared to her baseline score. It was determined that she is within the recommended range when compared to her baseline and no further action is needed at this time. She will continue SOZO screenings. These are done every 3 months for 2 years post operatively followed by every 6 months for 2 years, and then annually.    L-DEX FLOWSHEETS - 08/18/24 1600       L-DEX LYMPHEDEMA SCREENING   Measurement Type Unilateral    L-DEX MEASUREMENT EXTREMITY Upper Extremity    POSITION  Standing    DOMINANT SIDE Right    At Risk Side Right    BASELINE SCORE (UNILATERAL) -2.2    L-DEX SCORE (UNILATERAL) -2.4    VALUE CHANGE (UNILAT) -0.2         P: Cont annual SOZO screens.   Aden Berwyn Caldron, PTA 08/18/2024, 4:47 PM

## 2024-10-01 ENCOUNTER — Telehealth: Payer: Self-pay | Admitting: *Deleted

## 2024-10-01 MED ORDER — ANASTROZOLE 1 MG PO TABS
1.0000 mg | ORAL_TABLET | Freq: Every day | ORAL | 1 refills | Status: AC
Start: 2024-10-01 — End: ?

## 2024-10-01 NOTE — Telephone Encounter (Signed)
 Received called from pt with complaint of severe joint pain while on Letrozole . Pt requesting to change from Letrozole  back to Anastrozole .  Pt states she would rather have hot flashes than joint pain.  RN reviewed with MD and verbal orders received and placed for pt to stop Letrozole  and start Anastrozole  in 2 weeks.  Prescription sent to pharmacy on file.  Pt educated and verbalized understanding.

## 2024-10-07 ENCOUNTER — Encounter: Payer: Self-pay | Admitting: Physician Assistant

## 2024-10-07 ENCOUNTER — Ambulatory Visit: Payer: Self-pay | Attending: Physician Assistant | Admitting: Physician Assistant

## 2024-10-07 ENCOUNTER — Ambulatory Visit

## 2024-10-07 VITALS — BP 118/60 | HR 88 | Ht 63.0 in | Wt 174.8 lb

## 2024-10-07 DIAGNOSIS — R002 Palpitations: Secondary | ICD-10-CM

## 2024-10-07 DIAGNOSIS — I471 Supraventricular tachycardia, unspecified: Secondary | ICD-10-CM

## 2024-10-07 DIAGNOSIS — I2510 Atherosclerotic heart disease of native coronary artery without angina pectoris: Secondary | ICD-10-CM

## 2024-10-07 DIAGNOSIS — E785 Hyperlipidemia, unspecified: Secondary | ICD-10-CM

## 2024-10-07 DIAGNOSIS — I200 Unstable angina: Secondary | ICD-10-CM

## 2024-10-07 MED ORDER — METOPROLOL TARTRATE 25 MG PO TABS: 12.5000 mg | ORAL_TABLET | Freq: Two times a day (BID) | ORAL | 3 refills | Status: AC | PRN

## 2024-10-07 MED ORDER — BLOOD PRESSURE CUFF MISC: 0 refills | Status: AC

## 2024-10-07 NOTE — Progress Notes (Signed)
 Cardiology Office Note    Date:  10/07/2024   ID:  Cassandra, Cox 1961-03-14, MRN [id number]  PCP:  Cassandra Lamarr HERO, PA  Cardiologist:  Lonni Hanson, MD  Electrophysiologist:  None   Chief Complaint: Follow up  History of Present Illness:   Cassandra Cox is a 63 y.o. female with history of CAD s/p PCI/DES to the LAD (11/2016), PSVT, right breast cancer s/p lumpectomy and adjunctive chemotherapy (2021), hyperlipidemia, and anxiety who presents for follow-up on CAD and PSVT.    Patient was admitted 11/2016 with anterior STEMI.  Emergent LHC showed ostial LAD 30% stenosis, mid LAD 99% stenosis.  She underwent successful PCI/DES to the mid LAD.  Echo with normal LV function, G2 DD.  Admitted 09/2017 with chest pain and ruled out.  Lexiscan  MPI negative for significant ischemia or scar.  Echo with EF 55 to 60% and G2 DD.  Seen in the office 02/2020 noting more dyspnea with activity and at times inadvertently holding her breath.  Noted occasional brief palpitations.  In the setting of intolerance to several beta-blockers in the past, pharmacotherapy was deferred.  She was seen again 05/2020 and doing reasonably well continuing to note some exertional dyspnea which was largely unchanged from prior.  Echo showed EF 60 to 65%, G2 DD, and mild TR.  ZIO showed predominantly sinus rhythm with rare PACs/PVCs and a single 5 beat run of SVT.  Echo 03/2021 showed EF 55 to 60%, G1 DD, and trivial MR.   Patient was most recently seen in our office by Dr. Hanson 10/17/2023 at which time she was doing fairly well.  She noted sporadic palpitations, sometimes occurring several days in a row.  She also could go months without any palpitations.  They typically lasted 10 minutes or less and feeling heart racing.  Occasionally with associated lightheadedness.  No medication changes and additional testing were deferred at that time.  Patient presents to clinic today accompanied by her daughter and granddaughter.  She  has concern for intermittent episodes of palpitations.  She describes these episodes as feeling like her heart is racing, occasionally with associated shortness of breath and/or chest discomfort.  These occur approximately once every 1 to 2 weeks.  Sometimes she checks her smart watch or it alerts her that her heart rate is elevated, up to 140s bpm.  The episodes can last from a few minutes up to 30 minutes to an hour.  These have been ongoing for several years but she recently has been more worried about them.  She had an episode in late October during which she felt palpitations and her watch alerted her that she may be in atrial fibrillation.  She also notes being nauseous at that time which was unusual for her.  She denies any associated chest pain.  Symptoms were not similar to her prior heart attack.  She is fairly inactive throughout the week aside for the days that she takes care of her granddaughter.  She recently walked around the mall with her daughter and denies any chest pain or shortness of breath with exertion.  She denies lightheadedness, dizziness, and lower extremity swelling.  Labs independently reviewed: 09/2024 TC 181, TG 175, HDL 64, LDL 87, BUN 18, creatinine 0.61, sodium 139, potassium 4.4, normal LFTs, Hgb 13.5, HCT 41.9, platelets 291  Objective   Past Medical History:  Diagnosis Date   Anxiety    Complication of anesthesia    Coronary artery disease  a. 11/2016 Cath/PCI: LM nl, LAD 30ost, 1m (3.0x18 Resolute Integrity DES--3.75), LCX nl, RCA nl; b. MV 11/18: negative for ischemia, EF 72%, low risk   Depression    Diabetes mellitus without complication (HCC)    type 2   Diastolic dysfunction    a. 11/2016 Echo: EF 55-60%, Gr2 DD, LAE, nl RV size/fxn.   Family history of breast cancer    History of kidney stones    Hyperlipemia \\[id number] \   Hypothyroidism    Kidney stone 08/2016   Mitral valve prolapse    MVP (mitral valve prolapse)    Neuromuscular disorder (HCC)     bilateral feet   PONV (postoperative nausea and vomiting)    STEMI (ST elevation myocardial infarction) (HCC) 12/02/2016   Vitamin B 12 deficiency     Current Medications: Current Meds  Medication Sig   ALPRAZolam  (XANAX ) 0.25 MG tablet Take 0.25 mg by mouth 3 (three) times daily as needed for anxiety.   anastrozole  (ARIMIDEX ) 1 MG tablet Take 1 tablet (1 mg total) by mouth daily.   aspirin  EC 81 MG tablet Take 81 mg by mouth daily.   azelastine (ASTELIN) 0.1 % nasal spray Place 1 spray into both nostrils at bedtime.   buPROPion  (WELLBUTRIN  XL) 150 MG 24 hr tablet Take 300 mg by mouth daily.    Cholecalciferol  (VITAMIN D -3) 5000 UNITS TABS Take 1 tablet by mouth daily.   ezetimibe  (ZETIA ) 10 MG tablet Take 1 tablet (10 mg total) by mouth daily.   FLUoxetine  (PROZAC ) 20 MG capsule Take 20 mg by mouth daily.    gabapentin  (NEURONTIN ) 300 MG capsule TAKE 1 CAPSULE BY MOUTH EVERYDAY AT BEDTIME   levocetirizine (XYZAL) 5 MG tablet Take 1 tablet at bedtime by mouth.   liothyronine (CYTOMEL) 5 MCG tablet Take 10 mcg by mouth daily.   magnesium  gluconate (MAGONATE) 500 MG tablet Take 1,000 mg by mouth at bedtime.   metFORMIN (GLUCOPHAGE-XR) 500 MG 24 hr tablet Take 500 mg by mouth 2 (two) times daily.   montelukast  (SINGULAIR ) 10 MG tablet Take 10 mg by mouth every morning.   nitroGLYCERIN  (NITROSTAT ) 0.4 MG SL tablet Place 1 tablet (0.4 mg total) under the tongue every 5 (five) minutes as needed for chest pain. For maximum of 3 doses.   rosuvastatin  (CRESTOR ) 10 MG tablet TAKE 1 TABLET BY MOUTH DAILY   thyroid  (NP THYROID ) 60 MG tablet Take 2.5 tablets (150 mg total) by mouth daily before breakfast.   valACYclovir  (VALTREX ) 500 MG tablet Take 500 mg by mouth daily as needed.   vitamin C (ASCORBIC ACID) 500 MG tablet Take 500 mg by mouth daily.   zinc gluconate 50 MG tablet Take 50 mg by mouth daily.   zolpidem  (AMBIEN  CR) 12.5 MG CR tablet Take 12.5 mg by mouth at bedtime as needed for sleep.     Allergies:   Prednisone, Sulfa antibiotics, Sulfasalazine, and Sulfonamide derivatives   Social History   Socioeconomic History   Marital status: Divorced    Spouse name: Not on file   Number of children: Not on file   Years of education: Not on file   Highest education level: Not on file  Occupational History    Employer: [employer]  Tobacco Use   Smoking status: Never   Smokeless tobacco: Never  Vaping Use   Vaping status: Never Used  Substance and Sexual Activity   Alcohol use: No   Drug use: No   Sexual activity: Yes  Birth control/protection: Surgical  Other Topics Concern   Not on file  Social History Narrative   Not on file   Social Drivers of Health   Financial Resource Strain: Not on file  Food Insecurity: Not on file  Transportation Needs: Not on file  Physical Activity: Not on file  Stress: Not on file  Social Connections: Not on file     Family History:  The patient's family history includes Atrial fibrillation in her mother; Breast cancer (age of onset: 29) in her maternal aunt; COPD in her father; Diabetes in her maternal grandfather and mother; Heart attack in her mother; Heart disease in her mother; Hyperlipidemia in her father and mother; Hypertension in her father and mother; Stroke in her mother.  ROS:   12-point review of systems is negative unless otherwise noted in the HPI.  EKGs/Other Studies Reviewed:    Studies reviewed were summarized above. The additional studies were reviewed today:  03/2021 Echo limited 1. Left ventricular ejection fraction, by estimation, is 55 to 60%. The  left ventricle has normal function. The left ventricle has no regional  wall motion abnormalities. Left ventricular diastolic parameters are  consistent with Grade I diastolic  dysfunction (impaired relaxation).   2. Right ventricular systolic function is normal. The right ventricular  size is normal. There is normal pulmonary artery systolic  pressure.   3. The mitral valve is normal in structure. Trivial mitral valve  regurgitation. No evidence of mitral stenosis.   4. The aortic valve is tricuspid. Aortic valve regurgitation is not  visualized. No aortic stenosis is present.   5. The inferior vena cava is normal in size with greater than 50%  respiratory variability, suggesting right atrial pressure of 3 mmHg.   EKG:  EKG personally reviewed by me today EKG Interpretation Date/Time:  Tuesday October 07 2024 15:20:27 EST Ventricular Rate:  88 PR Interval:  106 QRS Duration:  80 QT Interval:  382 QTC Calculation: 462 R Axis:   12  Text Interpretation: Sinus rhythm with short PR Nonspecific ST and T wave abnormality Confirmed by Lorene Sinclair ([id number]) on 10/07/2024 3:30:18 PM  PHYSICAL EXAM:    VS:  BP 118/60 (BP Location: Left Arm, Patient Position: Sitting, Cuff Size: Normal)   Pulse 88   Ht 5' 3 (1.6 m)   Wt 174 lb 12.8 oz (79.3 kg)   SpO2 97%   BMI 30.96 kg/m   BMI: Body mass index is 30.96 kg/m.  GEN: Well nourished, well developed in no acute distress NECK: No JVD; No carotid bruits CARDIAC: RRR, no murmurs, rubs, gallops RESPIRATORY:  Clear to auscultation without rales, wheezing or rhonchi  ABDOMEN: Soft, non-tender, non-distended EXTREMITIES: No edema; No deformity  Wt Readings from Last 3 Encounters:  10/07/24 174 lb 12.8 oz (79.3 kg)  08/18/24 166 lb 2 oz (75.4 kg)  10/17/23 159 lb (72.1 kg)                  ASSESSMENT & PLAN:   Palpitations PSVT - Prior heart monitor 06/2020 with 1 short run of PSVT.  Today she reports intermittent episodes of palpitations lasting up to an hour.  Smart watch did not notify her that she was in atrial fibrillation once in late October.  Recommend obtaining 14-day ZIO monitor to assess for arrhythmia.  Previously did not tolerate bisoprolol  or Toprol  XL due to fatigue.  Will provide metoprolol  tartrate 12.5 mg to take twice daily as needed for sustained  palpitations.  Coronary  artery disease - No symptoms of angina or cardiac decompensation. No ischemic evaluation indicated at this time. She is continued on ASA 81 mg daily, ezetimibe  10 mg daily, and rosuvastatin  10 mg daily.   Hyperlipidemia - Most recent lipid panel 09/2024 with LDL 87, above goal. She believed this is due to a medication she was previously taking that she has since discontinued. Recommend repeating a lipid panel at follow up to trend.  Previously did not tolerate higher doses of statins.  Could consider addition of bempedoic acid if LDL remains above goal.  For now, continue ezetimibe  and rosuvastatin  as above.    Disposition: Update 2-week ZIO XT.  Start metoprolol  tartrate 12.5 mg twice daily as needed for palpitations.  F/u with Dr. Mady or an APP in 3 to 4 months.   Medication Adjustments/Labs and Tests Ordered: Current medicines are reviewed at length with the patient today.  Concerns regarding medicines are outlined above. Medication changes, Labs and Tests ordered today are summarized above and listed in the Patient Instructions accessible in Encounters.   Bonney Lesley Maffucci, PA-C 10/07/2024 4:02 PM     [facility] [place] [address] [place], [place] [address] [phone]

## 2024-10-07 NOTE — Patient Instructions (Addendum)
 Medication Instructions:  Your physician recommends the following medication changes.  START TAKING: METOPROLOL  TARTRATE 12.5MG  2 TIMES DAILY AS NEEDED FOR PALPATIONS    *If you need a refill on your cardiac medications before your next appointment, please call your pharmacy*  Lab Work: No labs ordered today  If you have labs (blood work) drawn today and your tests are completely normal, you will receive your results only by: MyChart Message (if you have MyChart) OR A paper copy in the mail If you have any lab test that is abnormal or we need to change your treatment, we will call you to review the results.  Testing/Procedures: Your physician has recommended that you wear a Zio monitor.   This monitor is a medical device that records the heart's electrical activity. Doctors most often use these monitors to diagnose arrhythmias. Arrhythmias are problems with the speed or rhythm of the heartbeat. The monitor is a small device applied to your chest. You can wear one while you do your normal daily activities. While wearing this monitor if you have any symptoms to push the button and record what you felt. Once you have worn this monitor for the period of time provider prescribed (Usually 14 days), you will return the monitor device in the postage paid box. Once it is returned they will download the data collected and provide us  with a report which the provider will then review and we will call you with those results. Important tips:  Avoid showering during the first 24 hours of wearing the monitor. Avoid excessive sweating to help maximize wear time. Do not submerge the device, no hot tubs, and no swimming pools. Keep any lotions or oils away from the patch. After 24 hours you may shower with the patch on. Take brief showers with your back facing the shower head.  Do not remove patch once it has been placed because that will interrupt data and decrease adhesive wear time. Push the button when  you have any symptoms and write down what you were feeling. Once you have completed wearing your monitor, remove and place into box which has postage paid and place in your outgoing mailbox.  If for some reason you have misplaced your box then call our office and we can provide another box and/or mail it off for you.     Follow-Up: At [facility], you and your health needs are our priority.  As part of our continuing mission to provide you with exceptional heart care, our providers are all part of one team.  This team includes your primary Cardiologist (physician) and Advanced Practice Providers or APPs (Physician Assistants and Nurse Practitioners) who all work together to provide you with the care you need, when you need it.  Your next appointment:   3-4 month(s)  Provider:   You may see Lonni Hanson, MD or one of the following Advanced Practice Providers on your designated Care Team:   Lonni Meager, NP Lesley Maffucci, PA-C Bernardino Bring, PA-C Cadence [place], PA-C Tylene Lunch, NP Barnie Hila, NP

## 2024-10-16 ENCOUNTER — Telehealth: Payer: Self-pay | Admitting: Internal Medicine

## 2024-10-16 ENCOUNTER — Other Ambulatory Visit: Payer: Self-pay

## 2024-10-16 NOTE — Telephone Encounter (Signed)
 Pt c/o medication issue:  1. Name of Medication:   ezetimibe  (ZETIA ) 10 MG tablet    2. How are you currently taking this medication (dosage and times per day)? As written   3. Are you having a reaction (difficulty breathing--STAT)? No   4. What is your medication issue? Pt would like a new script for medication   90 day if possible, Pt is low on medication

## 2024-10-17 NOTE — Telephone Encounter (Signed)
 Refill was sent in 10/16/24.

## 2024-10-21 ENCOUNTER — Telehealth: Payer: Self-pay | Admitting: Internal Medicine

## 2024-10-21 NOTE — Telephone Encounter (Signed)
 Pt would like a c/b to see how long she is suppose to wear her heart monitor please advise

## 2024-10-21 NOTE — Telephone Encounter (Signed)
 Spoke with the patient, who stated she could not remember how long she was to wear her heart monitor. Per [employer] office note, the patient is to wear the monitor for 14 days. The patient was informed and verbalized understanding.

## 2024-11-06 DIAGNOSIS — I25110 Atherosclerotic heart disease of native coronary artery with unstable angina pectoris: Secondary | ICD-10-CM

## 2024-11-06 DIAGNOSIS — R002 Palpitations: Secondary | ICD-10-CM

## 2024-11-07 ENCOUNTER — Ambulatory Visit: Payer: Self-pay | Admitting: Physician Assistant

## 2024-11-07 NOTE — Progress Notes (Signed)
 "  Cardiology Office Note    Date:  11/10/2024   ID:  Cassandra, Cox 1961-03-19, MRN [id number]  PCP:  Geraldine Lamarr HERO, PA  Cardiologist:  Lonni Hanson, MD  Electrophysiologist:  None   Chief Complaint: Follow up  History of Present Illness:   Cassandra Cox is a 64 y.o. female with history of newly diagnosed atrial fibrillation, CAD s/p PCI/DES to the LAD (11/2016), PSVT, right breast cancer s/p lumpectomy and adjunctive chemotherapy (2021), hyperlipidemia, and anxiety who presents for follow up after testing.    Patient was admitted 11/2016 with anterior STEMI.  Emergent LHC showed ostial LAD 30% stenosis, mid LAD 99% stenosis.  She underwent successful PCI/DES to the mid LAD.  Echo with normal LV function, G2 DD.  Admitted 09/2017 with chest pain and ruled out.  Lexiscan  MPI negative for significant ischemia or scar.  Echo with EF 55 to 60% and G2 DD.  Seen in the office 02/2020 noting more dyspnea with activity and at times inadvertently holding her breath.  Noted occasional brief palpitations.  In the setting of intolerance to several beta-blockers in the past, pharmacotherapy was deferred.  She was seen again 05/2020 and doing reasonably well continuing to note some exertional dyspnea which was largely unchanged from prior.  Echo showed EF 60 to 65%, G2 DD, and mild TR.  ZIO showed predominantly sinus rhythm with rare PACs/PVCs and a single 5 beat run of SVT.  Echo 03/2021 showed EF 55 to 60%, G1 DD, and trivial MR.    Patient was most recently seen by myself 10/07/2024 reporting intermittent episodes of palpitations associated with shortness of breath and/or chest pain.  These were occurring once every 1 to 2 weeks.  She reported that her smart watch had reported her heart rate was elevated occasionally.  ZIO monitor revealed a new onset atrial fibrillation with average rate 149 bpm lasting up to 2 hours and 43 minutes.  Overall burden was 2%.  Patient presents today with ongoing  intermittent episodes of palpitations, the most recent of which she was wearing her heart monitor during.  We reviewed the results together and her episode of atrial fibrillation lasting 2 hours and 43 minutes was when she was symptomatic.  She describes this as feeling palpitations with associated fatigue and mild shortness of breath.  She otherwise denies chest pain, shortness of breath, lightheadedness, dizziness, orthopnea, and lower extremity swelling.   Labs independently reviewed: 09/2024 TC 181, TG 175, HDL 64, LDL 87, BUN 18, creatinine 0.61, sodium 139, potassium 4.4, normal LFTs, Hgb 13.5, HCT 41.9, platelets 291   Objective   Past Medical History:  Diagnosis Date   Anxiety    Complication of anesthesia    Coronary artery disease    a. 11/2016 Cath/PCI: LM nl, LAD 30ost, 60m (3.0x18 Resolute Integrity DES--3.75), LCX nl, RCA nl; b. MV 11/18: negative for ischemia, EF 72%, low risk   Depression    Diabetes mellitus without complication (HCC)    type 2   Diastolic dysfunction    a. 11/2016 Echo: EF 55-60%, Gr2 DD, LAE, nl RV size/fxn.   Family history of breast cancer    History of kidney stones    Hyperlipemia \\[id number] \   Hypothyroidism    Kidney stone 08/2016   Mitral valve prolapse    MVP (mitral valve prolapse)    Neuromuscular disorder (HCC)    bilateral feet   PONV (postoperative nausea and vomiting)    STEMI (ST elevation myocardial  infarction) (HCC) 12/02/2016   Vitamin B 12 deficiency     Current Medications: Active Medications[1]  Allergies:   Prednisone, Sulfa antibiotics, Sulfasalazine, and Sulfonamide derivatives   Social History   Socioeconomic History   Marital status: Divorced    Spouse name: Not on file   Number of children: Not on file   Years of education: Not on file   Highest education level: Not on file  Occupational History    Employer: [employer]  Tobacco Use   Smoking status: Never   Smokeless tobacco: Never  Vaping Use    Vaping status: Never Used  Substance and Sexual Activity   Alcohol use: No   Drug use: No   Sexual activity: Yes    Birth control/protection: Surgical  Other Topics Concern   Not on file  Social History Narrative   Not on file   Social Drivers of Health   Tobacco Use: Low Risk (11/10/2024)   Patient History    Smoking Tobacco Use: Never    Smokeless Tobacco Use: Never    Passive Exposure: Not on file  Financial Resource Strain: Not on file  Food Insecurity: Not on file  Transportation Needs: Not on file  Physical Activity: Not on file  Stress: Not on file  Social Connections: Not on file  Depression (EYV7-0): Not on file  Alcohol Screen: Not on file  Housing: Not on file  Utilities: Not on file  Health Literacy: Not on file     Family History:  The patient's family history includes Atrial fibrillation in her mother; Breast cancer (age of onset: 38) in her maternal aunt; COPD in her father; Diabetes in her maternal grandfather and mother; Heart attack in her mother; Heart disease in her mother; Hyperlipidemia in her father and mother; Hypertension in her father and mother; Stroke in her mother.  ROS:   12-point review of systems is negative unless otherwise noted in the HPI.  EKGs/Other Studies Reviewed:    Studies reviewed were summarized above. The additional studies were reviewed today:  10/2024 Long term monitor   The patient was monitored for 14 days.   The predominant rhythm was sinus with an average rate of 91 bpm (range 60-131 bpm in sinus).   There were rare PACs and PVCs.   Paroxysmal atrial fibrillation/flutter occurred.  Average ventricular rate was 149 bpm (range 105-239 bpm).  The longest episode lasted 2 hours, 43 minutes.  Atrial fibrillation/flutter burden was 2%.   A single 4 beat run of NSVT occurred with a maximum rate of 150 bpm.   There was no prolonged pause.   Patient triggered events correspond to sinus rhythm, atrial fibrillation/flutter with  rapid ventricular response, PACs, and PVCs.  03/2021 2D echo 1. Left ventricular ejection fraction, by estimation, is 55 to 60%. The  left ventricle has normal function. The left ventricle has no regional  wall motion abnormalities. Left ventricular diastolic parameters are  consistent with Grade I diastolic  dysfunction (impaired relaxation).   2. Right ventricular systolic function is normal. The right ventricular  size is normal. There is normal pulmonary artery systolic pressure.   3. The mitral valve is normal in structure. Trivial mitral valve  regurgitation. No evidence of mitral stenosis.   4. The aortic valve is tricuspid. Aortic valve regurgitation is not  visualized. No aortic stenosis is present.   5. The inferior vena cava is normal in size with greater than 50%  respiratory variability, suggesting right atrial pressure of  3 mmHg.   EKG:  EKG personally reviewed by me today EKG Interpretation Date/Time:  Monday November 10 2024 14:58:11 EST Ventricular Rate:  88 PR Interval:  104 QRS Duration:  90 QT Interval:  384 QTC Calculation: 464 R Axis:   17  Text Interpretation: Sinus rhythm with short PR Nonspecific ST abnormality Confirmed by Lorene Sinclair ([id number]) on 11/10/2024 3:01:36 PM  PHYSICAL EXAM:    VS:  BP 116/60 (BP Location: Left Arm, Patient Position: Sitting, Cuff Size: Large)   Pulse 88   Ht 5' 3 (1.6 m)   Wt 168 lb 12.8 oz (76.6 kg)   SpO2 98%   BMI 29.90 kg/m   BMI: Body mass index is 29.9 kg/m.  GEN: Well nourished, well developed in no acute distress NECK: No JVD; No carotid bruits CARDIAC: RRR, no murmurs, rubs, gallops RESPIRATORY:  Clear to auscultation without rales, wheezing or rhonchi  ABDOMEN: Soft, non-tender, non-distended EXTREMITIES: No edema; No deformity  Wt Readings from Last 3 Encounters:  11/10/24 168 lb 12.8 oz (76.6 kg)  10/07/24 174 lb 12.8 oz (79.3 kg)  08/18/24 166 lb 2 oz (75.4 kg)                  ASSESSMENT & PLAN:    Newly diagnosed paroxysmal atrial fibrillation - Noted on recent heart monitor 10/2024 which showed a burden of 2% with the longest episode lasting 2 hours and 43 minutes, average heart rate 149 bpm.  Recommend starting Eliquis  5 mg twice daily for CHA2DS2-VASc of at least 2.  She has not previously tolerated long-acting metoprolol  or bisoprolol .  Will try Cardizem  CD 120 mg daily.  She can continue metoprolol  tartrate 12.5 mg as needed for sustained palpitations. Update echo.   Coronary artery disease - No symptoms of angina or cardiac decompensation. She is continued on ASA 81 mg daily, ezetimibe  10 mg daily, and rosuvastatin  10 mg daily.   Hyperlipidemia - Most recent lipid panel 09/2024 with LDL 87, above goal.  She believes this was elevated due to the medication she was taking that has since been discontinued.  Did not tolerate higher doses of statins in the past.  Continue ezetimibe  and rosuvastatin  as above.  Anticipate repeat lipid panel at follow-up with consideration of bempedoic acid if LDL remains above goal.   Disposition: Start Cardizem  CD 120 mg daily and Eliquis  5 mg twice daily. Obtain echo. F/u with Dr. Mady as scheduled in 01/2025.    Medication Adjustments/Labs and Tests Ordered: Current medicines are reviewed at length with the patient today.  Concerns regarding medicines are outlined above. Medication changes, Labs and Tests ordered today are summarized above and listed in the Patient Instructions accessible in Encounters.   Bonney Sinclair Lorene, PA-C 11/10/2024 3:02 PM     [facility] [place] [address] [place], [place] [address] [phone]      [1]  Current Meds  Medication Sig   albuterol  (VENTOLIN  HFA) 108 (90 Base) MCG/ACT inhaler Inhale 1-2 puffs into the lungs every 6 (six) hours as needed for wheezing or shortness of breath.   ALPRAZolam  (XANAX ) 0.25 MG tablet Take 0.25 mg by mouth 3 (three) times daily as needed for  anxiety.   anastrozole  (ARIMIDEX ) 1 MG tablet Take 1 tablet (1 mg total) by mouth daily.   aspirin  EC 81 MG tablet Take 81 mg by mouth daily.   azelastine (ASTELIN) 0.1 % nasal spray Place 1 spray into both nostrils at  bedtime.   Blood Pressure Monitoring (BLOOD PRESSURE CUFF) MISC Check blood pressure as instructed by your physician   buPROPion  (WELLBUTRIN  XL) 150 MG 24 hr tablet Take 300 mg by mouth daily.    Cholecalciferol  (VITAMIN D -3) 5000 UNITS TABS Take 1 tablet by mouth daily.   ezetimibe  (ZETIA ) 10 MG tablet Take 1 tablet (10 mg total) by mouth daily.   FLUoxetine  (PROZAC ) 20 MG capsule Take 20 mg by mouth daily.    gabapentin  (NEURONTIN ) 300 MG capsule TAKE 1 CAPSULE BY MOUTH EVERYDAY AT BEDTIME   guaiFENesin -codeine  100-10 MG/5ML syrup Take 5 mLs by mouth at bedtime as needed for cough.   levocetirizine (XYZAL) 5 MG tablet Take 1 tablet at bedtime by mouth.   liothyronine (CYTOMEL) 5 MCG tablet Take 10 mcg by mouth daily.   magnesium  gluconate (MAGONATE) 500 MG tablet Take 1,000 mg by mouth at bedtime.   metFORMIN (GLUCOPHAGE-XR) 500 MG 24 hr tablet Take 500 mg by mouth 2 (two) times daily.   metoprolol  tartrate (LOPRESSOR ) 25 MG tablet Take 0.5 tablets (12.5 mg total) by mouth 2 (two) times daily as needed.   montelukast  (SINGULAIR ) 10 MG tablet Take 10 mg by mouth every morning.   nitroGLYCERIN  (NITROSTAT ) 0.4 MG SL tablet Place 1 tablet (0.4 mg total) under the tongue every 5 (five) minutes as needed for chest pain. For maximum of 3 doses.   rosuvastatin  (CRESTOR ) 10 MG tablet TAKE 1 TABLET BY MOUTH DAILY   scopolamine (TRANSDERM-SCOP) 1 MG/3DAYS Place 1 patch onto the skin daily.   thyroid  (NP THYROID ) 60 MG tablet Take 2.5 tablets (150 mg total) by mouth daily before breakfast.   valACYclovir  (VALTREX ) 500 MG tablet Take 500 mg by mouth daily as needed.   vitamin C (ASCORBIC ACID) 500 MG tablet Take 500 mg by mouth daily.   zinc gluconate 50 MG tablet Take 50 mg by mouth  daily.   zolpidem  (AMBIEN  CR) 12.5 MG CR tablet Take 12.5 mg by mouth at bedtime as needed for sleep.   "

## 2024-11-09 ENCOUNTER — Ambulatory Visit
Admission: EM | Admit: 2024-11-09 | Discharge: 2024-11-09 | Disposition: A | Discharge status: Home / Self Care | Attending: Emergency Medicine | Admitting: Emergency Medicine

## 2024-11-09 ENCOUNTER — Encounter: Payer: Self-pay | Admitting: Emergency Medicine

## 2024-11-09 DIAGNOSIS — R051 Acute cough: Secondary | ICD-10-CM

## 2024-11-09 DIAGNOSIS — J0140 Acute pansinusitis, unspecified: Secondary | ICD-10-CM | POA: Diagnosis not present

## 2024-11-09 MED ORDER — ALBUTEROL SULFATE HFA 108 (90 BASE) MCG/ACT IN AERS: 1.0000 | INHALATION_SPRAY | Freq: Four times a day (QID) | RESPIRATORY_TRACT | 0 refills | Status: AC | PRN

## 2024-11-09 MED ORDER — GUAIFENESIN-CODEINE 100-10 MG/5ML PO SOLN: 5.0000 mL | Freq: Every evening | ORAL | 0 refills | Status: AC | PRN

## 2024-11-09 NOTE — Discharge Instructions (Addendum)
 Symptoms today are most consistent with a sinus infection therefore continue use of antibiotic as directed, on exam your lungs are clear and you are getting enough air without assistance and at this time I have a low suspicion that you have pneumonia  For your shortness of breath you may use albuterol  inhaler taking 2 puffs every 4-6 hours as needed to open and relax the airway  You may continue use of Tessalon  pearls, at nighttime you may use guaifenesin  codeine  to help you rest You can take Tylenol  and/or Ibuprofen as needed for fever reduction and pain relief.   For cough: honey 1/2 to 1 teaspoon (you can dilute the honey in water or another fluid).  You can also use guaifenesin  and dextromethorphan for cough. You can use a humidifier for chest congestion and cough.  If you don't have a humidifier, you can sit in the bathroom with the hot shower running.      For sore throat: try warm salt water gargles, cepacol lozenges, throat spray, warm tea or water with lemon/honey, popsicles or ice, or OTC cold relief medicine for throat discomfort.   For congestion: take a daily anti-histamine like Zyrtec, Claritin, and a oral decongestant, such as pseudoephedrine.  You can also use Flonase 1-2 sprays in each nostril daily.   It is important to stay hydrated: drink plenty of fluids (water, gatorade/powerade/pedialyte, juices, or teas) to keep your throat moisturized and help further relieve irritation/discomfort.  At any point if you feel that symptoms are worsening please seek out reevaluation, if symptoms do not improve despite use of additional treatment please follow-up for

## 2024-11-09 NOTE — ED Provider Notes (Signed)
 " CAY RALPH PELT    CSN: [id number] Arrival date & time: 11/09/24  1401      History   Chief Complaint No chief complaint on file.   HPI Cassandra Cox is a 64 y.o. female.   Patient presents for evaluation of subjective fever, chills, generalized bodyaches, nasal congestion, sinus pressure, sore throat and a nonproductive cough with shortness of breath and wheezing present for 2 weeks.  Cough interfering with sleep worsening over the last 24 hours.  Completed e-visit and is currently taking amoxicillin  for treatment of a sinusitis, has taken 2 days of medication, additionally has attempted Tessalon  and Delsym.     Past Medical History:  Diagnosis Date   Anxiety    Complication of anesthesia    Coronary artery disease    a. 11/2016 Cath/PCI: LM nl, LAD 30ost, 36m (3.0x18 Resolute Integrity DES--3.75), LCX nl, RCA nl; b. MV 11/18: negative for ischemia, EF 72%, low risk   Depression    Diabetes mellitus without complication (HCC)    type 2   Diastolic dysfunction    a. 11/2016 Echo: EF 55-60%, Gr2 DD, LAE, nl RV size/fxn.   Family history of breast cancer    History of kidney stones    Hyperlipemia \\[id number] \   Hypothyroidism    Kidney stone 08/2016   Mitral valve prolapse    MVP (mitral valve prolapse)    Neuromuscular disorder (HCC)    bilateral feet   PONV (postoperative nausea and vomiting)    STEMI (ST elevation myocardial infarction) (HCC) 12/02/2016   Vitamin B 12 deficiency     Patient Active Problem List   Diagnosis Date Noted   Palpitations 10/17/2023   PSVT (paroxysmal supraventricular tachycardia) 10/17/2023   Hyperlipidemia associated with type 2 diabetes mellitus (HCC) 05/05/2021   Genetic testing 01/11/2021   Port-A-Cath in place 12/14/2020   Family history of breast cancer    Malignant neoplasm of upper-outer quadrant of right breast in female, estrogen receptor positive (HCC) 08/13/2020   Shortness of breath 02/19/2020   Essential hypertension  09/09/2018   Influenza B 10/18/2017   Unstable angina (HCC) 09/14/2017   Coronary artery disease involving native coronary artery of native heart without angina pectoris 07/18/2017   Insomnia 08/25/2015   Hot flashes, menopausal 08/25/2015   Well woman exam 07/22/2015   Family history of diabetes mellitus 07/15/2014   Vitamin D  deficiency 11/03/2009   Hyperlipidemia LDL goal <70 11/03/2009   Anxiety state 11/03/2009   Depression 11/03/2009    Past Surgical History:  Procedure Laterality Date   ABDOMINAL HYSTERECTOMY  2003   TAH due to adenomyosis   BREAST LUMPECTOMY WITH RADIOACTIVE SEED AND SENTINEL LYMPH NODE BIOPSY Right 10/04/2020   Procedure: RIGHT BREAST REDUCTION LUMPECTOMY WITH RADIOACTIVE SEED AND SENTINEL LYMPH NODE BIOPSY;  Surgeon: Curvin Deward MOULD, MD;  Location: [facility];  Service: General;  Laterality: Right;   BREAST REDUCTION SURGERY  1990   BREAST REDUCTION SURGERY Bilateral 10/04/2020   Procedure: BILATERAL MAMMARY REDUCTION  (BREAST);  Surgeon: Lowery Estefana RAMAN, DO;  Location: [facility];  Service: Plastics;  Laterality: Bilateral;   CARDIAC CATHETERIZATION N/A 12/02/2016   Procedure: Coronary Stent Intervention;  Surgeon: Lonni Hanson, MD;  Location: [facility];  Service: Cardiovascular;  Laterality: N/A;   CARDIAC CATHETERIZATION N/A 12/02/2016   Procedure: Left Heart Cath and Coronary Angiography;  Surgeon: Lonni Hanson, MD;  Location: [facility];  Service: Cardiovascular;  Laterality: N/A;  CARDIAC CATHETERIZATION N/A 12/02/2016   Procedure: Intravascular Ultrasound/IVUS;  Surgeon: Lonni Hanson, MD;  Location: [facility];  Service: Cardiovascular;  Laterality: N/A;   CORONARY STENT INTERVENTION     MOUTH SURGERY     Dental implant   PORT-A-CATH REMOVAL Left 10/20/2021   Procedure: REMOVAL PORT-A-CATH;  Surgeon: Curvin Deward MOULD, MD;  Location: [facility];  Service: General;   Laterality: Left;   PORTACATH PLACEMENT Left 12/13/2020   Procedure: INSERTION PORT-A-CATH LEFT SUBCLAVIAN;  Surgeon: Curvin Deward MOULD, MD;  Location: [facility];  Service: General;  Laterality: Left;    OB History     Gravida  2   Para  2   Term  2   Preterm      AB      Living  2      SAB      IAB      Ectopic      Multiple      Live Births               Home Medications    Prior to Admission medications  Medication Sig Start Date End Date Taking? Authorizing Provider  ALPRAZolam  (XANAX ) 0.25 MG tablet Take 0.25 mg by mouth 3 (three) times daily as needed for anxiety.    [provider]  anastrozole  (ARIMIDEX ) 1 MG tablet Take 1 tablet (1 mg total) by mouth daily. 10/01/24   Odean Potts, MD  aspirin  EC 81 MG tablet Take 81 mg by mouth daily.    [provider]  azelastine (ASTELIN) 0.1 % nasal spray Place 1 spray into both nostrils at bedtime. 08/10/23   [provider]  Blood Pressure Monitoring (BLOOD PRESSURE CUFF) MISC Check blood pressure as instructed by your physician 10/07/24   Lorene Lesley CROME, PA-C  buPROPion  (WELLBUTRIN  XL) 150 MG 24 hr tablet Take 300 mg by mouth daily.  05/30/19   [provider]  Cholecalciferol  (VITAMIN D -3) 5000 UNITS TABS Take 1 tablet by mouth daily.    [provider]  ezetimibe  (ZETIA ) 10 MG tablet Take 1 tablet (10 mg total) by mouth daily. 10/16/24   End, Lonni, MD  FLUoxetine  (PROZAC ) 20 MG capsule Take 20 mg by mouth daily.  07/17/19   [provider]  gabapentin  (NEURONTIN ) 300 MG capsule TAKE 1 CAPSULE BY MOUTH EVERYDAY AT BEDTIME 06/05/24   Gudena, Vinay, MD  levocetirizine (XYZAL) 5 MG tablet Take 1 tablet at bedtime by mouth. 08/28/17   [provider]  liothyronine (CYTOMEL) 5 MCG tablet Take 10 mcg by mouth daily.    [provider]  magnesium  gluconate (MAGONATE) 500 MG tablet Take 1,000 mg by mouth at bedtime.    [provider]  metFORMIN (GLUCOPHAGE-XR) 500 MG 24 hr tablet Take 500 mg by mouth 2 (two) times daily. 05/21/20   [provider]  metoprolol  tartrate (LOPRESSOR ) 25 MG tablet Take 0.5 tablets (12.5 mg total) by mouth 2 (two) times daily as needed. 10/07/24 01/05/25  Lorene Lesley CROME, PA-C  montelukast  (SINGULAIR ) 10 MG tablet Take 10 mg by mouth every morning.    [provider]  nitroGLYCERIN  (NITROSTAT ) 0.4 MG SL tablet Place 1 tablet (0.4 mg total) under the tongue every 5 (five) minutes as needed for chest pain. For maximum of 3 doses. 10/17/23   End, Lonni, MD  rosuvastatin  (CRESTOR ) 10 MG tablet TAKE 1 TABLET BY MOUTH DAILY 01/07/24   End, Lonni, MD  scopolamine (  TRANSDERM-SCOP) 1 MG/3DAYS Place 1 patch onto the skin daily. Patient not taking: Reported on 10/07/2024 04/09/23   [provider]  thyroid  (NP THYROID ) 60 MG tablet Take 2.5 tablets (150 mg total) by mouth daily before breakfast. 04/25/22   Odean Potts, MD  valACYclovir  (VALTREX ) 500 MG tablet Take 500 mg by mouth daily as needed.    [provider]  vitamin C (ASCORBIC ACID) 500 MG tablet Take 500 mg by mouth daily.    [provider]  zinc gluconate 50 MG tablet Take 50 mg by mouth daily.    [provider]  zolpidem  (AMBIEN  CR) 12.5 MG CR tablet Take 12.5 mg by mouth at bedtime as needed for sleep.    [provider]  prochlorperazine  (COMPAZINE ) 10 MG tablet Take 1 tablet (10 mg total) by mouth every 6 (six) hours as needed (Nausea or vomiting). 10/20/20 05/02/21  Lanny Callander, MD    Family History Family History  Problem Relation Age of Onset   Heart disease Mother    Diabetes Mother    Stroke Mother    Heart attack Mother    Atrial fibrillation Mother    Hypertension Mother    Hyperlipidemia Mother    COPD Father    Hypertension Father    Hyperlipidemia Father    Breast cancer Maternal Aunt 33       x2 age 33   Diabetes Maternal Grandfather      Social History Social History[1]   Allergies   Prednisone, Sulfa antibiotics, Sulfasalazine, and Sulfonamide derivatives   Review of Systems Review of Systems  Constitutional:  Positive for chills and fever. Negative for activity change, appetite change, diaphoresis, fatigue and unexpected weight change.  HENT:  Positive for congestion, sinus pressure and sore throat. Negative for dental problem, drooling, ear discharge, ear pain, facial swelling, hearing loss, mouth sores, nosebleeds, postnasal drip, rhinorrhea, sinus pain, sneezing, tinnitus, trouble swallowing and voice change.   Respiratory:  Positive for cough, shortness of breath and wheezing. Negative for apnea, choking, chest tightness and stridor.   Gastrointestinal: Negative.   Skin: Negative.      Physical Exam Triage Vital Signs ED Triage Vitals  Encounter Vitals Group     BP      Girls Systolic BP Percentile      Girls Diastolic BP Percentile      Boys Systolic BP Percentile      Boys Diastolic BP Percentile      Pulse      Resp      Temp      Temp src      SpO2      Weight      Height      Head Circumference      Peak Flow      Pain Score      Pain Loc      Pain Education      Exclude from Growth Chart    No data found.  Updated Vital Signs There were no vitals taken for this visit.  Visual Acuity Right Eye Distance:   Left Eye Distance:   Bilateral Distance:    Right Eye Near:   Left Eye Near:    Bilateral Near:     Physical Exam Constitutional:      Appearance: Normal appearance.  HENT:     Head: Normocephalic.     Right Ear: Tympanic membrane, ear canal and external ear normal.     Left Ear: Tympanic membrane,  ear canal and external ear normal.     Nose: Congestion present. No rhinorrhea.     Mouth/Throat:     Pharynx: Posterior oropharyngeal erythema present. No oropharyngeal exudate.  Cardiovascular:     Rate and Rhythm: Normal rate and regular rhythm.     Pulses: Normal  pulses.     Heart sounds: Normal heart sounds.  Pulmonary:     Effort: Pulmonary effort is normal.     Breath sounds: Normal breath sounds.  Musculoskeletal:     Cervical back: Normal range of motion.  Lymphadenopathy:     Cervical: Cervical adenopathy present.  Neurological:     Mental Status: She is alert and oriented to person, place, and time. Mental status is at baseline.      UC Treatments / Results  Labs (all labs ordered are listed, but only abnormal results are displayed) Labs Reviewed - No data to display  EKG   Radiology No results found.  Procedures Procedures (including critical care time)  Medications Ordered in UC Medications - No data to display  Initial Impression / Assessment and Plan / UC Course  I have reviewed the triage vital signs and the nursing notes.  Pertinent labs & imaging results that were available during my care of the patient were reviewed by me and considered in my medical decision making (see chart for details).  Acute nonrecurrent pansinusitis, acute cough  Vital signs are stable, lungs clear to auscultation, O2 saturation 95% on room air, low suspicion for pneumonia therefore imaging deferred, currently taking amoxicillin  and advised patient to continue taking course as directed prescribed albuterol  inhaler and guaifenesin  codeine , declined use of steroids, recommended over-the-counter medications and nonpharmacological support with follow-up as needed Final Clinical Impressions(s) / UC Diagnoses   Final diagnoses:  None   Discharge Instructions   None    ED Prescriptions   None    PDMP not reviewed this encounter.     [1]  Social History Tobacco Use   Smoking status: Never   Smokeless tobacco: Never  Vaping Use   Vaping status: Never Used  Substance Use Topics   Alcohol use: No   Drug use: No     Teresa Shelba SAUNDERS, NP 11/12/24 [phone]  "

## 2024-11-10 ENCOUNTER — Ambulatory Visit: Attending: Physician Assistant | Admitting: Physician Assistant

## 2024-11-10 ENCOUNTER — Encounter: Payer: Self-pay | Admitting: Physician Assistant

## 2024-11-10 VITALS — BP 116/60 | HR 88 | Ht 63.0 in | Wt 168.8 lb

## 2024-11-10 DIAGNOSIS — I2510 Atherosclerotic heart disease of native coronary artery without angina pectoris: Secondary | ICD-10-CM | POA: Diagnosis not present

## 2024-11-10 DIAGNOSIS — I480 Paroxysmal atrial fibrillation: Secondary | ICD-10-CM

## 2024-11-10 DIAGNOSIS — E785 Hyperlipidemia, unspecified: Secondary | ICD-10-CM | POA: Diagnosis not present

## 2024-11-10 MED ORDER — APIXABAN 5 MG PO TABS: 5.0000 mg | ORAL_TABLET | Freq: Two times a day (BID) | ORAL | 6 refills | Status: AC

## 2024-11-10 MED ORDER — DILTIAZEM HCL ER COATED BEADS 120 MG PO CP24: 120.0000 mg | ORAL_CAPSULE | Freq: Every day | ORAL | 3 refills | Status: AC

## 2024-11-10 NOTE — Patient Instructions (Addendum)
 Medication Instructions:  Your physician recommends the following medication changes.  START TAKING: Eliquis  5 MG 2 Times Daily Cardizem  CD 120 MG Daily  *If you need a refill on your cardiac medications before your next appointment, please call your pharmacy*  Lab Work: No labs ordered today  If you have labs (blood work) drawn today and your tests are completely normal, you will receive your results only by: MyChart Message (if you have MyChart) OR A paper copy in the mail If you have any lab test that is abnormal or we need to change your treatment, we will call you to review the results.  Testing/Procedures: Your physician has requested that you have an echocardiogram. Echocardiography is a painless test that uses sound waves to create images of your heart. It provides your doctor with information about the size and shape of your heart and how well your hearts chambers and valves are working.   You may receive an ultrasound enhancing agent through an IV if needed to better visualize your heart during the echo. This procedure takes approximately one hour.  There are no restrictions for this procedure.  This will take place at [address] [facility], [place] [address]  Please note: We ask at that you not bring children with you during ultrasound (echo/ vascular) testing. Due to room size and safety concerns, children are not allowed in the ultrasound rooms during exams. Our front office staff cannot provide observation of children in our lobby area while testing is being conducted. An adult accompanying a patient to their appointment will only be allowed in the ultrasound room at the discretion of the ultrasound technician under special circumstances. We apologize for any inconvenience.   Follow-Up: At [facility], you and your health needs are our priority.  As part of our continuing mission to provide you with exceptional heart care, our providers  are all part of one team.  This team includes your primary Cardiologist (physician) and Advanced Practice Providers or APPs (Physician Assistants and Nurse Practitioners) who all work together to provide you with the care you need, when you need it.  Your next appointment:   Keep Appointment in March 2026 with Dr. Mady   Provider:   You may see Lonni Mady, MD or one of the following Advanced Practice Providers on your designated Care Team:   Lonni Meager, NP Lesley Maffucci, PA-C Bernardino Bring, PA-C Cadence Calcium, PA-C Tylene Lunch, NP Barnie Hila, NP

## 2024-11-12 ENCOUNTER — Other Ambulatory Visit: Payer: Self-pay | Admitting: Internal Medicine

## 2024-11-12 NOTE — Telephone Encounter (Signed)
" °*  STAT* If patient is at the pharmacy, call can be transferred to refill team.   1. Which medications need to be refilled? (please list name of each medication and dose if known) apixaban  (ELIQUIS ) 5 MG TABS tablet    2. Would you like to learn more about the convenience, safety, & potential cost savings by using the [facility]? No    3. Are you open to using the [facility] (Type [facility].  ). No    4. Which pharmacy/location (including street and city if local pharmacy) is medication to be sent to?[facility] - [place], [place] - [address] [facility] AT Portal to [facility] Sites    5. Do they need a 30 day or 90 day supply? 30 day    Pt medication was sent to the wrong would like to send to [facility] please advise  "

## 2024-11-14 NOTE — Telephone Encounter (Signed)
 Prescription refill request for Eliquis  received. Indication: A-Fib Last office visit: 11/10/24 Scr: 0.56 10/18/21 Care Everywhere Age: 64 Weight:76.6 KG Pt has Passed Parameters. Labs overdue

## 2024-11-17 ENCOUNTER — Ambulatory Visit

## 2024-11-17 DIAGNOSIS — I2510 Atherosclerotic heart disease of native coronary artery without angina pectoris: Secondary | ICD-10-CM

## 2024-11-17 DIAGNOSIS — I480 Paroxysmal atrial fibrillation: Secondary | ICD-10-CM | POA: Diagnosis not present

## 2024-11-17 LAB — ECHOCARDIOGRAM COMPLETE
AR max vel: 2.02 cm2
AV Area VTI: 2.16 cm2
AV Area mean vel: 2.08 cm2
AV Mean grad: 4 mmHg
AV Peak grad: 7.6 mmHg
Ao pk vel: 1.38 m/s
Area-P 1/2: 3.6 cm2
S' Lateral: 2.6 cm

## 2024-11-18 ENCOUNTER — Ambulatory Visit: Payer: Self-pay | Admitting: Physician Assistant

## 2024-11-26 ENCOUNTER — Ambulatory Visit: Payer: Self-pay | Admitting: Internal Medicine

## 2025-01-07 ENCOUNTER — Ambulatory Visit: Admitting: Internal Medicine

## 2025-08-17 ENCOUNTER — Ambulatory Visit: Payer: Self-pay

## 2025-08-26 ENCOUNTER — Ambulatory Visit: Payer: Self-pay | Admitting: Hematology and Oncology
